# Patient Record
Sex: Male | Born: 1960 | Race: White | Hispanic: No | Marital: Single | Smoking: Former smoker
Health system: Southern US, Community
[De-identification: ages and names within clinical notes are randomized; demographics above are authoritative.]

## PROBLEM LIST (undated history)

## (undated) DIAGNOSIS — E11319 Type 2 diabetes mellitus with unspecified diabetic retinopathy without macular edema: Secondary | ICD-10-CM

## (undated) DIAGNOSIS — I459 Conduction disorder, unspecified: Secondary | ICD-10-CM

## (undated) DIAGNOSIS — K259 Gastric ulcer, unspecified as acute or chronic, without hemorrhage or perforation: Secondary | ICD-10-CM

## (undated) DIAGNOSIS — R0602 Shortness of breath: Secondary | ICD-10-CM

## (undated) DIAGNOSIS — J449 Chronic obstructive pulmonary disease, unspecified: Secondary | ICD-10-CM

## (undated) DIAGNOSIS — I10 Essential (primary) hypertension: Secondary | ICD-10-CM

## (undated) DIAGNOSIS — M1990 Unspecified osteoarthritis, unspecified site: Secondary | ICD-10-CM

## (undated) DIAGNOSIS — J189 Pneumonia, unspecified organism: Secondary | ICD-10-CM

## (undated) DIAGNOSIS — G4730 Sleep apnea, unspecified: Secondary | ICD-10-CM

## (undated) DIAGNOSIS — Z87442 Personal history of urinary calculi: Secondary | ICD-10-CM

## (undated) DIAGNOSIS — H35039 Hypertensive retinopathy, unspecified eye: Secondary | ICD-10-CM

## (undated) DIAGNOSIS — Z8719 Personal history of other diseases of the digestive system: Secondary | ICD-10-CM

## (undated) DIAGNOSIS — E785 Hyperlipidemia, unspecified: Secondary | ICD-10-CM

## (undated) DIAGNOSIS — J339 Nasal polyp, unspecified: Secondary | ICD-10-CM

## (undated) DIAGNOSIS — K219 Gastro-esophageal reflux disease without esophagitis: Secondary | ICD-10-CM

## (undated) HISTORY — PX: ROTATOR CUFF REPAIR: SHX139

## (undated) HISTORY — PX: COLONOSCOPY W/ POLYPECTOMY: SHX1380

## (undated) HISTORY — DX: Type 2 diabetes mellitus with unspecified diabetic retinopathy without macular edema: E11.319

## (undated) HISTORY — PX: ESOPHAGOGASTRODUODENOSCOPY: SHX1529

## (undated) HISTORY — DX: Hypertensive retinopathy, unspecified eye: H35.039

## (undated) HISTORY — PX: EYE SURGERY: SHX253

## (undated) HISTORY — DX: Conduction disorder, unspecified: I45.9

## (undated) NOTE — *Deleted (*Deleted)
[facility] - Clinic Note  11/23/2019     CHIEF COMPLAINT Patient presents for No chief complaint on file.   HISTORY OF PRESENT ILLNESS: Juan [name] is a 46 y.o. male who presents to the clinic today for:   pt states he has been having a lot of double vision over the past month, he states he still has a blank spot in his left eye vision, he states the double vision is worse when he is looking to the left   Referring physician: Laurann Montana, MD [phone] [address] [place],  [place] [address]  HISTORICAL INFORMATION:   Selected notes from the MEDICAL RECORD NUMBER Referred by Dr. Jethro Bolus for concern of CME s/p cataract sx   CURRENT MEDICATIONS: Current Outpatient Medications (Ophthalmic Drugs)  Medication Sig  . dorzolamide-timolol (COSOPT) 22.3-6.8 MG/ML ophthalmic solution Place 1 drop into both eyes 2 (two) times daily.  Marland Kitchen ketorolac (ACULAR) 0.5 % ophthalmic solution Place 1 drop into both eyes 4 (four) times daily. (Patient taking differently: Place 1 drop into both eyes in the morning and at bedtime. )  . prednisoLONE acetate (PRED FORTE) 1 % ophthalmic suspension Place 1 drop into both eyes 4 (four) times daily.   No current facility-administered medications for this visit. (Ophthalmic Drugs)   Current Outpatient Medications (Other)  Medication Sig  . ADVAIR DISKUS 500-50 MCG/DOSE AEPB Inhale 1 puff into the lungs 2 (two) times daily.  Marland Kitchen albuterol (PROVENTIL HFA;VENTOLIN HFA) 108 (90 BASE) MCG/ACT inhaler Inhale 1-2 puffs into the lungs every 6 (six) hours as needed for wheezing or shortness of breath.   . alfuzosin (UROXATRAL) 10 MG 24 hr tablet TAKE 1 TABLET BY MOUTH TWICE A DAY  . aspirin EC 81 MG tablet Take 81 mg by mouth daily.  Marland Kitchen atorvastatin (LIPITOR) 40 MG tablet Take 40 mg by mouth daily.  . carvedilol (COREG) 6.25 MG tablet Take 1 tablet (6.25 mg total) by mouth 2 (two) times daily with a meal.  . Empagliflozin-metFORMIN  HCl (SYNJARDY) 12.06-998 MG TABS Take 1 tablet by mouth 2 (two) times a day.   . esomeprazole (NEXIUM) 40 MG capsule Take 40 mg by mouth daily at 12 noon.  . insulin glargine, 2 Unit Dial, (TOUJEO MAX SOLOSTAR) 300 UNIT/ML Solostar Pen Inject 40 Units into the skin daily.   . iron polysaccharides (NIFEREX) 150 MG capsule Take 150 mg by mouth daily.  Marland Kitchen lisinopril (ZESTRIL) 40 MG tablet Take 40 mg by mouth daily.  . pantoprazole (PROTONIX) 40 MG tablet Take 40 mg by mouth daily.  . predniSONE (DELTASONE) 20 MG tablet Take by mouth.  . Semaglutide,0.25 or 0.5MG /DOS, (OZEMPIC, 0.25 OR 0.5 MG/DOSE,) 2 MG/1.5ML SOPN Inject into the skin.  . Tiotropium Bromide Monohydrate (SPIRIVA RESPIMAT) 2.5 MCG/ACT AERS Inhale 2 puffs into the lungs daily.   No current facility-administered medications for this visit. (Other)      REVIEW OF SYSTEMS:    ALLERGIES Allergies  Allergen Reactions  . Naproxen Shortness Of Breath  . Naproxen Sodium Shortness Of Breath  . Penicillins Rash    Has patient had a PCN reaction causing immediate rash, facial/tongue/throat swelling, SOB or lightheadedness with hypotension: Yes Has patient had a PCN reaction causing severe rash involving mucus membranes or skin necrosis: No Has patient had a PCN reaction that required hospitalization No Has patient had a PCN reaction occurring within the last 10 years: No If all of the above answers are "NO", then  may proceed with Cephalosporin use.  Has patient had a PCN reaction causing immediate rash, facial/tongue/throat swelling, SOB or lightheadedness with hypotension: Yes Has patient had a PCN reaction causing severe rash involving mucus membranes or skin necrosis: No Has patient had a PCN reaction that required hospitalization No Has patient had a PCN reaction occurring within the last 10 years: No If all of the above answers are "NO", then may proceed with Cephalosporin use.     PAST MEDICAL HISTORY Past Medical  History:  Diagnosis Date  . Arthritis   . Asthma   . COPD (chronic obstructive pulmonary disease) (HCC)   . Diabetes mellitus    Type 2  . Diabetic retinopathy (HCC)    NPDR OU  . GERD (gastroesophageal reflux disease)   . H/O hiatal hernia   . History of kidney stones   . Hyperlipemia   . Hypertension   . Hypertensive retinopathy    OU  . Nasal polyps   . Pneumonia 2014  . Shortness of breath   . Sleep apnea    pt has CPAP but is unable to use it d/t having polyps in his nose. Pt stated "I need to call and get a CPAP mask instead"  . Stomach ulcer    Past Surgical History:  Procedure Laterality Date  . CATARACT EXTRACTION Right 07/05/2018   Dr. Nile Riggs  . CATARACT EXTRACTION Left 07/12/2018   Dr. Nile Riggs  . CHOLECYSTECTOMY N/A 01/23/2015   Procedure: LAPAROSCOPIC CHOLECYSTECTOMY ;  Surgeon: Violeta Gelinas, MD;  Location: [facility] OR;  Service: General;  Laterality: N/A;  . COLONOSCOPY W/ POLYPECTOMY    . ESOPHAGOGASTRODUODENOSCOPY    . EYE SURGERY    . KNEE ARTHROSCOPY  03/24/2011   Procedure: ARTHROSCOPY KNEE;  Surgeon: Harvie Junior, MD;  Location: [facility];  Service: Orthopedics;  Laterality: Right;  partial medial menisectomy, chondroplaty patella, and medial medial plica  . LEFT HEART CATH AND CORONARY ANGIOGRAPHY N/A 06/29/2019   Procedure: LEFT HEART CATH AND CORONARY ANGIOGRAPHY;  Surgeon: Iran Ouch, MD;  Location: [facility];  Service: Cardiovascular;  Laterality: N/A;    FAMILY HISTORY Family History  Problem Relation Age of Onset  . Diabetes Father     SOCIAL HISTORY Social History   Tobacco Use  . Smoking status: Former Smoker    Packs/day: 3.00    Years: 20.00    Pack years: 60.00    Types: Cigarettes  . Smokeless tobacco: Current User    Types: Chew  . Tobacco comment: 1 can/daily  Substance Use Topics  . Alcohol use: Yes    Comment: social, 1x weekly  . Drug use: No         OPHTHALMIC EXAM:  Not recorded      IMAGING AND PROCEDURES  Imaging and Procedures for @TODAY @           ASSESSMENT/PLAN:    ICD-10-CM   1. Severe nonproliferative diabetic retinopathy of both eyes with macular edema associated with type 2 diabetes mellitus (HCC)  Z61.0960   2. 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 Essential hypertension  I10   5. Hypertensive retinopathy of both eyes  H35.033   6. Pseudophakia of both eyes  Z96.1   7. Ocular hypertension of right eye  H40.051     1. Severe nonproliferative diabetic retinopathy with DME, OU (OS>OD)  - s/p IVA OD #  1 (06.26.20), #2 (08.14.20), #3 (09.17.20), #4 (10.16.20)  - s/p IVA OS #1 (07.17.20), #2 (08.14.20), #3 (09.17.20)  - s/p IVA OU (08.14.20), #3 (09.17.20)  - s/p IVE OS #1 (10.16.20) - sample, #2 (11.13.20), #3 (12.11.20), #4 (01.18.21), #5 (02.19.21), #6 (03.19.21), #7 (04.16.21), #8 (05.14.21), #9 (06.17.21), #10 (07.16.21), #11 (09.21.21)  - s/p IVE OD #1 (11.13.20), #2 (12.11.20), #3 (01.18.21), #4 (02.19.21), #5 (03.19.21), #6 (04.16.21), #7 (05.14.21), #8 (06.17.21), #9 (07.16.21), #10 (09.21.21)  - FA (06.26.20) shows Severe NPDR OU w/ Late leaking MA OU, Enlarged FAZ OU, No NV OU, No significant hyperfluorescence of disc  - pt reporting worsening binocular diplopia in left gaze over the last month; no diplopia in primary gaze  - BCVA: OD 20/25 - stable; OS stable at 20/60  - OCT shows mild interval improvement in IRF OU  - recommend IVE OD #11 and IVE #12 OS today, 10.22.21  - pt wishes to proceed  - RBA of procedure discussed, questions answered  - informed consent obtained  - Eylea informed consent form signed and scanned on 01.18.2021  - see procedure note  - [employer] Benefits Investigation initiated 10.16.2020 -- approved for 2021  - f/u in 4 weeks -- DFE/OCT/possible injection OU  2. Retinal Edema OU  - based on FA, suspect majority of macular edema due to DM as above, but  there may be some edema secondary to post-op CME  - cont PF qid OU and ketorolac qid OU   - history of difficulty with compliance at work  - monitor  - f/u 4 wks  3. Choroidal Nevus OS  - located at 1200, mild elevation, +drusen, no SRF  - baseline optos pictures obtained, 06.26.20  - discussed possible referral to ocular oncologist at [facility] for evaluation/management  4,5.Hypertensive retinopathy OU  - discussed importance of tight BP control  - monitor  6. Pseudophakia OU  - s/p CE/IOL OU (OD on 06.03.20 and OS 06.10.20 by Dr. Nile Riggs)  - beautiful surgeries w/ IOLs in excellent position  - macular edema limiting vision as above  - monitor  7. Ocular hypertension OD  - IOP 13 OD  - cont Cosopt BID OD  - monitor   Ophthalmic Meds Ordered this visit:  No orders of the defined types were placed in this encounter.     No follow-ups on file.  There are no Patient Instructions on file for this visit.   Explained the diagnoses, plan, and follow up with the patient and they expressed understanding.  Patient expressed understanding of the importance of proper follow up care.   This document serves as a record of services personally performed by Karie Chimera, MD, PhD. It was created on their behalf by Glee Arvin. Manson Passey, OA an ophthalmic technician. The creation of this record is the provider's dictation and/or activities during the visit.    Electronically signed by: Glee Arvin. [place], [place] 10.18.2021 1:03 PM  Karie Chimera, M.D., Ph.D. Diseases & Surgery of the Retina and Vitreous [facility]   Abbreviations: M myopia (nearsighted); A astigmatism; H hyperopia (farsighted); P presbyopia; Mrx spectacle prescription;  CTL contact lenses; OD right eye; OS left eye; OU both eyes  XT exotropia; ET esotropia; PEK punctate epithelial keratitis; PEE punctate epithelial erosions; DES dry eye syndrome; MGD meibomian gland dysfunction; ATs artificial  tears; PFAT's preservative free artificial tears; NSC nuclear sclerotic cataract; PSC posterior subcapsular cataract; ERM epi-retinal membrane; PVD posterior vitreous detachment; RD  retinal detachment; DM diabetes mellitus; DR diabetic retinopathy; NPDR non-proliferative diabetic retinopathy; PDR proliferative diabetic retinopathy; CSME clinically significant macular edema; DME diabetic macular edema; dbh dot blot hemorrhages; CWS cotton wool spot; POAG primary open angle glaucoma; C/D cup-to-disc ratio; HVF humphrey visual field; GVF goldmann visual field; OCT optical coherence tomography; IOP intraocular pressure; BRVO Branch retinal vein occlusion; CRVO central retinal vein occlusion; CRAO central retinal artery occlusion; BRAO branch retinal artery occlusion; RT retinal tear; SB scleral buckle; PPV pars plana vitrectomy; VH Vitreous hemorrhage; PRP panretinal laser photocoagulation; IVK intravitreal kenalog; VMT vitreomacular traction; MH Macular hole;  NVD neovascularization of the disc; NVE neovascularization elsewhere; AREDS age related eye disease study; ARMD age related macular degeneration; POAG primary open angle glaucoma; EBMD epithelial/anterior basement membrane dystrophy; ACIOL anterior chamber intraocular lens; IOL intraocular lens; PCIOL posterior chamber intraocular lens; Phaco/IOL phacoemulsification with intraocular lens placement; PRK photorefractive keratectomy; LASIK laser assisted in situ keratomileusis; HTN hypertension; DM diabetes mellitus; COPD chronic obstructive pulmonary disease

---

## 1997-06-10 ENCOUNTER — Emergency Department (HOSPITAL_COMMUNITY): Admission: EM | Admit: 1997-06-10 | Discharge: 1997-06-10 | Payer: Self-pay | Admitting: Emergency Medicine

## 1998-03-05 ENCOUNTER — Emergency Department (HOSPITAL_COMMUNITY): Admission: EM | Admit: 1998-03-05 | Discharge: 1998-03-05 | Payer: Self-pay | Admitting: Emergency Medicine

## 1998-03-16 ENCOUNTER — Ambulatory Visit (HOSPITAL_COMMUNITY): Admission: RE | Admit: 1998-03-16 | Discharge: 1998-03-16 | Payer: Self-pay | Admitting: Orthopedic Surgery

## 1998-03-16 ENCOUNTER — Encounter: Payer: Self-pay | Admitting: Orthopedic Surgery

## 1998-03-19 ENCOUNTER — Encounter: Admission: RE | Admit: 1998-03-19 | Discharge: 1998-03-19 | Payer: Self-pay | Admitting: *Deleted

## 1998-04-16 ENCOUNTER — Emergency Department (HOSPITAL_COMMUNITY): Admission: EM | Admit: 1998-04-16 | Discharge: 1998-04-16 | Payer: Self-pay

## 1998-04-16 ENCOUNTER — Encounter: Payer: Self-pay | Admitting: Emergency Medicine

## 1998-07-04 ENCOUNTER — Emergency Department (HOSPITAL_COMMUNITY): Admission: EM | Admit: 1998-07-04 | Discharge: 1998-07-04 | Payer: Self-pay | Admitting: Emergency Medicine

## 1998-07-04 ENCOUNTER — Encounter: Payer: Self-pay | Admitting: Emergency Medicine

## 1998-07-30 ENCOUNTER — Encounter: Payer: Self-pay | Admitting: Emergency Medicine

## 1998-07-30 ENCOUNTER — Observation Stay (HOSPITAL_COMMUNITY): Admission: EM | Admit: 1998-07-30 | Discharge: 1998-07-31 | Payer: Self-pay | Admitting: Emergency Medicine

## 1998-09-01 ENCOUNTER — Emergency Department (HOSPITAL_COMMUNITY): Admission: EM | Admit: 1998-09-01 | Discharge: 1998-09-01 | Payer: Self-pay | Admitting: Emergency Medicine

## 1999-03-15 ENCOUNTER — Emergency Department (HOSPITAL_COMMUNITY): Admission: EM | Admit: 1999-03-15 | Discharge: 1999-03-15 | Payer: Self-pay | Admitting: Emergency Medicine

## 1999-03-15 ENCOUNTER — Encounter: Payer: Self-pay | Admitting: Emergency Medicine

## 1999-04-11 ENCOUNTER — Emergency Department (HOSPITAL_COMMUNITY): Admission: EM | Admit: 1999-04-11 | Discharge: 1999-04-11 | Payer: Self-pay | Admitting: Emergency Medicine

## 1999-05-14 ENCOUNTER — Emergency Department (HOSPITAL_COMMUNITY): Admission: EM | Admit: 1999-05-14 | Discharge: 1999-05-14 | Payer: Self-pay | Admitting: Emergency Medicine

## 1999-05-14 ENCOUNTER — Encounter: Payer: Self-pay | Admitting: Emergency Medicine

## 1999-06-09 ENCOUNTER — Encounter: Payer: Self-pay | Admitting: Emergency Medicine

## 1999-06-09 ENCOUNTER — Emergency Department (HOSPITAL_COMMUNITY): Admission: EM | Admit: 1999-06-09 | Discharge: 1999-06-09 | Payer: Self-pay | Admitting: Emergency Medicine

## 1999-07-23 ENCOUNTER — Emergency Department (HOSPITAL_COMMUNITY): Admission: EM | Admit: 1999-07-23 | Discharge: 1999-07-24 | Payer: Self-pay | Admitting: Emergency Medicine

## 1999-07-24 ENCOUNTER — Encounter: Payer: Self-pay | Admitting: Emergency Medicine

## 2000-01-10 ENCOUNTER — Emergency Department (HOSPITAL_COMMUNITY): Admission: EM | Admit: 2000-01-10 | Discharge: 2000-01-10 | Payer: Self-pay | Admitting: Emergency Medicine

## 2000-02-09 ENCOUNTER — Emergency Department (HOSPITAL_COMMUNITY): Admission: EM | Admit: 2000-02-09 | Discharge: 2000-02-09 | Payer: Self-pay | Admitting: Emergency Medicine

## 2000-04-04 ENCOUNTER — Encounter: Payer: Self-pay | Admitting: Emergency Medicine

## 2000-04-04 ENCOUNTER — Emergency Department (HOSPITAL_COMMUNITY): Admission: EM | Admit: 2000-04-04 | Discharge: 2000-04-04 | Payer: Self-pay | Admitting: Emergency Medicine

## 2000-04-29 ENCOUNTER — Emergency Department (HOSPITAL_COMMUNITY): Admission: EM | Admit: 2000-04-29 | Discharge: 2000-04-29 | Payer: Self-pay | Admitting: Emergency Medicine

## 2000-04-29 ENCOUNTER — Encounter: Payer: Self-pay | Admitting: Emergency Medicine

## 2004-07-06 ENCOUNTER — Observation Stay (HOSPITAL_COMMUNITY): Admission: EM | Admit: 2004-07-06 | Discharge: 2004-07-07 | Payer: Self-pay | Admitting: Emergency Medicine

## 2004-07-07 ENCOUNTER — Encounter: Payer: Self-pay | Admitting: Cardiology

## 2006-08-09 ENCOUNTER — Emergency Department (HOSPITAL_COMMUNITY): Admission: EM | Admit: 2006-08-09 | Discharge: 2006-08-09 | Payer: Self-pay | Admitting: Emergency Medicine

## 2010-03-04 ENCOUNTER — Observation Stay (HOSPITAL_COMMUNITY)
Admission: EM | Admit: 2010-03-04 | Discharge: 2010-03-07 | Disposition: A | Discharge status: Home / Self Care | Payer: Commercial Indemnity | Attending: Internal Medicine | Admitting: Internal Medicine

## 2010-03-04 ENCOUNTER — Emergency Department (HOSPITAL_COMMUNITY): Payer: Commercial Indemnity

## 2010-03-04 DIAGNOSIS — Z79899 Other long term (current) drug therapy: Secondary | ICD-10-CM | POA: Insufficient documentation

## 2010-03-04 DIAGNOSIS — J449 Chronic obstructive pulmonary disease, unspecified: Secondary | ICD-10-CM | POA: Insufficient documentation

## 2010-03-04 DIAGNOSIS — R1013 Epigastric pain: Secondary | ICD-10-CM | POA: Insufficient documentation

## 2010-03-04 DIAGNOSIS — R0789 Other chest pain: Principal | ICD-10-CM | POA: Insufficient documentation

## 2010-03-04 DIAGNOSIS — F411 Generalized anxiety disorder: Secondary | ICD-10-CM | POA: Insufficient documentation

## 2010-03-04 DIAGNOSIS — E785 Hyperlipidemia, unspecified: Secondary | ICD-10-CM | POA: Insufficient documentation

## 2010-03-04 DIAGNOSIS — E119 Type 2 diabetes mellitus without complications: Secondary | ICD-10-CM | POA: Insufficient documentation

## 2010-03-04 DIAGNOSIS — Z7982 Long term (current) use of aspirin: Secondary | ICD-10-CM | POA: Insufficient documentation

## 2010-03-04 DIAGNOSIS — J4489 Other specified chronic obstructive pulmonary disease: Secondary | ICD-10-CM | POA: Insufficient documentation

## 2010-03-04 DIAGNOSIS — K8020 Calculus of gallbladder without cholecystitis without obstruction: Secondary | ICD-10-CM | POA: Insufficient documentation

## 2010-03-04 DIAGNOSIS — R000 Tachycardia, unspecified: Secondary | ICD-10-CM | POA: Insufficient documentation

## 2010-03-04 DIAGNOSIS — I10 Essential (primary) hypertension: Secondary | ICD-10-CM | POA: Insufficient documentation

## 2010-03-04 LAB — POCT I-STAT, CHEM 8
BUN: 12 mg/dL (ref 6–23)
Calcium, Ion: 1.11 mmol/L — ABNORMAL LOW (ref 1.12–1.32)
Chloride: 101 mEq/L (ref 96–112)
Creatinine, Ser: 0.9 mg/dL (ref 0.4–1.5)
Glucose, Bld: 248 mg/dL — ABNORMAL HIGH (ref 70–99)
HCT: 42 % (ref 39.0–52.0)
Hemoglobin: 14.3 g/dL (ref 13.0–17.0)
Potassium: 3.8 mEq/L (ref 3.5–5.1)
Sodium: 139 mEq/L (ref 135–145)
TCO2: 25 mmol/L (ref 0–100)

## 2010-03-04 LAB — CBC
HCT: 40.5 % (ref 39.0–52.0)
Hemoglobin: 15 g/dL (ref 13.0–17.0)
MCHC: 37 g/dL — ABNORMAL HIGH (ref 30.0–36.0)
MCV: 84.7 fL (ref 78.0–100.0)
RDW: 13.9 % (ref 11.5–15.5)
WBC: 6.6 10*3/uL (ref 4.0–10.5)

## 2010-03-04 LAB — POCT CARDIAC MARKERS
CKMB, poc: 1.9 ng/mL (ref 1.0–8.0)
CKMB, poc: 1.8 ng/mL (ref 1.0–8.0)
Myoglobin, poc: 63 ng/mL (ref 12–200)

## 2010-03-04 LAB — DIFFERENTIAL
Basophils Absolute: 0 10*3/uL (ref 0.0–0.1)
Eosinophils Relative: 2 % (ref 0–5)
Lymphocytes Relative: 21 % (ref 12–46)
Lymphs Abs: 1.4 10*3/uL (ref 0.7–4.0)
Monocytes Absolute: 0.6 10*3/uL (ref 0.1–1.0)
Neutro Abs: 4.5 10*3/uL (ref 1.7–7.7)

## 2010-03-04 MED ORDER — IOHEXOL 300 MG/ML  SOLN
100.0000 mL | Freq: Once | INTRAMUSCULAR | Status: AC | PRN
  Filled 2010-03-04: qty 100

## 2010-03-05 DIAGNOSIS — R079 Chest pain, unspecified: Secondary | ICD-10-CM

## 2010-03-05 LAB — LIPID PANEL
HDL: 32 mg/dL — ABNORMAL LOW (ref >39)
Total CHOL/HDL Ratio: 2.8 RATIO
VLDL: 13 mg/dL (ref 0–40)

## 2010-03-05 LAB — CARDIAC PANEL(CRET KIN+CKTOT+MB+TROPI)
CK, MB: 2.4 ng/mL (ref 0.3–4.0)
CK, MB: 2.1 ng/mL (ref 0.3–4.0)
CK, MB: 2.3 ng/mL (ref 0.3–4.0)
CK, MB: 2.6 ng/mL (ref 0.3–4.0)
Relative Index: 2 (ref 0.0–2.5)
Total CK: 100 U/L (ref 7–232)
Total CK: 105 U/L (ref 7–232)
Total CK: 99 U/L (ref 7–232)
Troponin I: 0.01 ng/mL (ref 0.00–0.06)
Troponin I: 0.01 ng/mL (ref 0.00–0.06)

## 2010-03-05 LAB — GLUCOSE, CAPILLARY
Glucose-Capillary: 115 mg/dL — ABNORMAL HIGH (ref 70–99)
Glucose-Capillary: 152 mg/dL — ABNORMAL HIGH (ref 70–99)
Glucose-Capillary: 278 mg/dL — ABNORMAL HIGH (ref 70–99)

## 2010-03-05 LAB — COMPREHENSIVE METABOLIC PANEL
ALT: 30 U/L (ref 0–53)
AST: 22 U/L (ref 0–37)
Alkaline Phosphatase: 46 U/L (ref 39–117)
Calcium: 9.1 mg/dL (ref 8.4–10.5)
GFR calc Af Amer: >60 mL/min (ref >60)
Potassium: 4.1 mEq/L (ref 3.5–5.1)
Sodium: 137 mEq/L (ref 135–145)
Total Protein: 6.3 g/dL (ref 6.0–8.3)

## 2010-03-05 LAB — HEMOGLOBIN A1C: Hgb A1c MFr Bld: 9.1 % — ABNORMAL HIGH (ref <5.7)

## 2010-03-06 LAB — GLUCOSE, CAPILLARY: Glucose-Capillary: 213 mg/dL — ABNORMAL HIGH (ref 70–99)

## 2010-03-06 LAB — CARDIAC PANEL(CRET KIN+CKTOT+MB+TROPI)
CK, MB: 2.7 ng/mL (ref 0.3–4.0)
Total CK: 77 U/L (ref 7–232)

## 2010-03-07 LAB — GLUCOSE, CAPILLARY
Glucose-Capillary: 184 mg/dL — ABNORMAL HIGH (ref 70–99)
Glucose-Capillary: 251 mg/dL — ABNORMAL HIGH (ref 70–99)

## 2010-03-14 NOTE — Op Note (Signed)
  NAMEAMAREON, Juan [name]              ACCOUNT NO.:  [id number]  MEDICAL RECORD NO.:  [id number]           PATIENT TYPE:  I  LOCATION:  1427                         FACILITY:  [facility]  PHYSICIAN:  Luis Abed, MD, FACCDATE OF BIRTH:  Jul 04, 1960  DATE OF PROCEDURE:  03/06/2010 DATE OF DISCHARGE:                              OPERATIVE REPORT   The patient has had chest pressure.  This study is done for further evaluation.  Walking on the treadmill, the patient exercised for a total of 6 minutes on the Bruce protocol.  Peak heart rate was 153 representing 89% predicted maximum heart rate.  There was no chest pain.  There was no EKG change.  There was no significant ectopy.  This is a negative (normal) GXT.  There is no definite sign of ischemia.     Luis Abed, MD, [facility]     JDK/MEDQ  D:  03/07/2010  T:  03/07/2010  Job:  [id number]  Electronically Signed by Willa Rough MD FACC on 03/09/2010 01:49:00 PM

## 2010-03-25 NOTE — H&P (Signed)
NAMENASIRE, REALI              ACCOUNT NO.:  [id number]  MEDICAL RECORD NO.:  [id number]           PATIENT TYPE:  E  LOCATION:  WLED                         FACILITY:  [facility]  PHYSICIAN:  Massie Maroon, MD        DATE OF BIRTH:  29-Jul-1960  DATE OF ADMISSION:  03/04/2010 DATE OF DISCHARGE:                             HISTORY & PHYSICAL   PRIMARY CARE PHYSICIAN:  Stacie Acres. White, MD  CHIEF COMPLAINT:  Chest tightness.  HISTORY OF PRESENT ILLNESS:  This is a 50 year old male with a history of diabetes type 2, hypertension, hyperlipidemia, asthma/COPD, who apparently complains of tightness in the chest without radiation.  It is substernal in nature.  This started actually lying down this morning about 1:00.  The patient denies any radiation of the pain.  The patient denies any fever, chills, cough, palpitations, nausea, vomiting. Nothing seems to make it better or worse.  Other than possibly laying down, makes it worse.  The patient tried albuterol without relief. Because of chest tightness, he presented to the ED for evaluation.  He noted that his father had a heart attack at age 83 and died.  EKG shows a normal sinus rhythm at 90, normal axis, normal PR interval, no ST, T segment changes consistent with ischemia.  Chest x-ray was negative for any acute process.  Initial set of cardiac markers was negative.  CT angio chest shows no evidence of pulmonary embolism. There were several tiny calcified gallstones incidentally noted.  The patient was admitted for workup of chest pain.  PAST MEDICAL HISTORY: 1. Asthma/COPD. 2. Diabetes type 2. 3. Hypertension. 4. Hyperlipidemia. 5. GERD.  PAST SURGICAL HISTORY:  None.  SOCIAL HISTORY:  The patient lives with spouse.  He does not smoke or drink at the present time.  He quit smoking about 10 years, smoked 1 pack per day for 40 years.  FAMILY HISTORY:  Mother is alive at age 50 and healthy.  Father died at age 54 of heart attack  and he was a smoker.  ALLERGIES:  PENICILLIN CAUSES RASH.  MEDICATIONS: 1. Advair Diskus?  250/50 1 puff b.i.d. 2. Albuterol as needed. 3. Enteric-coated aspirin 325 mg p.o. daily. 4. Glimepiride ? dose one p.o. b.i.d. 5. Lipitor ? dose q.h.s. 6. Lisinopril 5 mg p.o. daily. 7. Metformin 2000 mg p.o. q.p.m.  REVIEW OF SYSTEMS:  Negative for all 10 organ systems except for pertinent positives stated above.  PHYSICAL EXAM:  VITAL SIGNS:  Temperature 97.6, pulse 112, blood pressure is 151/87, pulse ox of 100% on room air. HEENT:  Anicteric, EOMI, no nystagmus, pupils 1.5 mm, symmetric.  Direct and consensual corneal reflexes intact.  Mucous membranes moist. NECK:  No JVD, no bruit, no thyromegaly, no adenopathy. HEART:  Regular rate and rhythm.  S1-S2.  No murmurs, gallops, or rubs. LUNGS:  Clear to auscultation bilaterally. ABDOMEN:  Soft, nontender, nondistended.  Positive bowel sounds. EXTREMITIES:  No cyanosis, clubbing, or edema. SKIN:  No rashes, lymphadenopathy, or adenopathy. NEURO:  Exam nonfocal.  LABORATORIES:  BUN 12, creatinine 0.9.  Troponin-I less than 0.05.  WBC 6.6, hemoglobin 15.0,  platelet count 159,000.  CT angio chest negative for pulmonary embolism or any acute findings, cholelithiasis incidentally noted.  ASSESSMENT/PLAN: 1. Chest pain with cardiac risk factors:  The patient placed on     telemetry.  We will check CK, CK-MB, troponin I q.6h. x3 sets.  He     will be continued on aspirin, Lipitor, and started on carvedilol     3.25 mg p.o. b.i.d.  The patient will also be continued on Advair     Diskus 250/50 1 puff b.i.d. and continued on metformin 1000 mg p.o.     b.i.d. and glimepiride 2 mg p.o. b.i.d. for now. 2. Diabetes type 2:  Fingerstick blood sugars a.c. and h.s.  Continue     NovoLog sliding scale and metformin and glimepiride as stated     above. 3. Hypertension.  Continue lisinopril and carvedilol. 4. Hyperlipidemia:  Continue Lipitor 80 mg  p.o. q.h.s.     Massie Maroon, MD     JYK/MEDQ  D:  03/04/2010  T:  03/04/2010  Job:  [id number]  cc:   Stacie Acres. Cliffton Asters, M.D. Fax: [phone]  Electronically Signed by Pearson Grippe MD on 03/25/2010 01:25:10 PM

## 2010-04-10 NOTE — Consult Note (Signed)
NAMEARNEY, MAYABB              ACCOUNT NO.:  [id number]  MEDICAL RECORD NO.:  [id number]           PATIENT TYPE:  I  LOCATION:  1427                         FACILITY:  [facility]  PHYSICIAN:  Pricilla Riffle, MD, FACCDATE OF BIRTH:  08/14/1960  DATE OF CONSULTATION:  03/05/2010 DATE OF DISCHARGE:                                CONSULTATION   IDENTIFICATION:  The patient is a 50 year old who we are asked to see regarding chest pain pressure.  HISTORY OF PRESENT ILLNESS:  The patient has no known history of coronary artery disease.  He had a stress test back in 2006 [facility]) that was normal.  Found to have a gastric ulcer at that time, it was causing chest pain treated for several years.  Note, he has had intermittent chest pain over the last several years that lasts 5-30 minutes, occurring at rest or with work.  Yesterday, he was going to lay down and noticed chest tightness that came across his chest.  No associated nausea, vomiting, significant shortness of breath lasted from approximately 1 p.m. to 9 p.m., relieved some after breathing treatment.  CTA was done without pulmonary embolus. EKG showed no acute changes.  We are consulted for further evaluation. He currently is pain free.  ALLERGIES:  PENICILLIN rash.  Inpatient meds include: 1. Aspirin 325. 2. Coreg 3.125 b.i.d. 3. Advair b.i.d. 4. Amaryl b.i.d. 5. NovoLog sliding scale. 6. Metformin 1 g b.i.d. 7. Crestor 40.  At home, he had been on: 1. Metformin 2 g bedtime. 2. Advair Diskus 1 puff b.i.d. 3. Onglyza 5 mg. 4. Lisinopril/HCTZ 20/25 daily. 5. Lipitor 40 daily. 6. Glyburide 4 mg b.i.d. 7. Ventolin inhalers p.r.n. 8. Aspirin 81 mg daily.  PAST MEDICAL HISTORY: 1. Diabetes. 2. Hypertension. 3. Hyperlipidemia. 4. COPD/asthma. 5. Gastric ulcers.  SOCIAL HISTORY:  The patient lives in [place].  He is divorced, lives with his sons, quit 10 years ago after a 40-pack year history of smoking.  Uses  occasionally.  Drinks occasionally.  Works as a Pharmacist, hospital.  FAMILY HISTORY:  Mother is healthy at age 30.  Father died at age 29 of an MI.  REVIEW OF SYSTEMS:  The patient does note recent URI in early January, feels like he did not fully recover and also has occasional reflux. Pain is substernal, shoots up chest.  Otherwise, all systems reviewed, negative to the above problem except as noted above.  PHYSICAL EXAMINATION:  GENERAL:  The patient is in no acute distress. VITAL SIGNS:  Blood pressure 143/77, pulse is 90 and regular, temperature is 97.5, O2 sat on room air 97%. HEENT:  Normocephalic, atraumatic.  EOMI.  PERRL. NECK:  JVP is normal.  No bruits.  No thyromegaly. LUNGS:  Rhonchi and wheezes.  On exam, moving air well.  No rales. CARDIAC:  Regular rate and rhythm.  S1 and S2.  No S3, S4, or murmurs. ABDOMEN:  Supple, nontender.  Normal bowel sounds.  No masses. CHEST:  Nontender. EXTREMITIES:  2+ distal pulses throughout.  No lower extremity edema. MUSCULOSKELETAL:  Moving all extremities.  No deformity. NEUROLOGIC:  Alert and orient x3.  Cranial  nerves II-XII grossly intact.  Chest x-ray shows no acute disease.  CTA with no pulmonary embolus.  EKG shows normal sinus rhythm, 90 beats per minute.  Labs, significant for a BUN and creatinine of 12 and 1.1.  CK-MB, troponin negative x3.  LDL is 45, HDL 32, triglycerides of 66.  AST of 22, hemoglobin of 15, WBC of 6.6.  IMPRESSION: 1. The patient is a 50 year old with hypertension, hyperlipidemia,     diabetes.  Lipids under excellent control.  Admitted with chest     tightness from yesterday.  Note, he had a URI in January, felt like     he has a recovery.  On exam, he has wheezes and rhonchi.  Moving     air.  With his risk factors of diabetes, family history, and history of chewing tobacco, smoking, and the fact that he has not had this tightness before, I recommended GXT Myoview in the morning.  If  negative follow up closely with Laurann Montana as an outpatient.  I would also recommend increasing his treatment for asthma with steroids and taper with his GI history, I have would give empiric proton pump inhibitors.  If the Myoview is negative, I would place him back on his lisinopril/HCTZ as he has been tolerating this and discontinue the beta- blocker. 1. Dyslipidemia excellent control.  Continue statin. 2. Diabetes on oral agents. 3. History of chew counseled on stopping.     Pricilla Riffle, MD, [facility]     PVR/MEDQ  D:  03/05/2010  T:  03/06/2010  Job:  [id number]  Electronically Signed by Dietrich Pates MD [facility] on 04/09/2010 02:12:20 PM

## 2010-05-28 NOTE — Discharge Summary (Signed)
  [name], Juan [name]              ACCOUNT NO.:  [id number]  MEDICAL RECORD NO.:  [id number]           PATIENT TYPE:  I  LOCATION:  1427                         [facility]ACILITY:  [facility]  PHYSICIAN:  Zannie Cove, MD     DATE OF BIRTH:  1960-08-04  DATE OF ADMISSION:  03/04/2010 DATE OF DISCHARGE:  03/07/2010                         DISCHARGE SUMMARY-REFERRING   DISCHARGE DIAGNOSES: 1. Atypical chest pain, noncardiac. 2. Mild chronic obstructive pulmonary disease flare. 3. Anxiety disorder. 4. Chronic obstructive pulmonary disease. 5. Type 2 diabetes. 6. Hypertension. 7. Dyslipidemia. 8. Gastroesophageal reflux disease. 9. Cholelithiasis.  DISCHARGE MEDICATIONS: 1. Azithromycin 250 mg daily for 4 days. 2. Prednisone taper over 1 week. 3. Xanax 0.25 mg p.o. b.i.d. p.r.n. 4. Advair Diskus 500/50 one puff b.i.d. 5. Aspirin 81 mg daily. 6. Glimepiride 4 mg p.o. b.i.d. 7. Lipitor 40 mg p.o. daily. 8. Lisinopril/hydrochlorothiazide 20/25 one tablet daily. 9. Metformin XR 500 mg daily. 10.Onglyza 5 mg use as directed. 11.Ventolin inhaler 2 puffs q. 4 h p.r.n.  DIAGNOSTIC INVESTIGATIONS: 1. Chest x-ray, March 04, 2010, no acute cardiopulmonary disease. 2. CT angio of the chest, March 04, 2010, no evidence of PE,     cholelithiasis incidentally noted. 3. Exercise stress test by Bruce Protocol with no evidence of     ischemia.  HOSPITAL COURSE:  [name] is a 50 year old gentleman with history of hypertension, asthma, tobacco use, and diabetes, presented to the hospital with chest pressure, he ruled out for acute MI.  On evaluation, he was noted to have obstructive bronchitis with mild COPD flare, however, due to his risk factors, he underwent exercise stress test, which did not show any evidence of inducible ischemia.  The patient's clinical condition improved with steroids, nebulizers, and he was advised on smoking cessation as well.  The patient is being discharged home in  the stable condition.  Follow up with his primary physician in the next 7 to 10 days.  DISCHARGE CONDITION:  Stable.  VITAL SIGNS AT DISCHARGE:  Temperature 97.6, pulse 78, blood pressure 121/82, respirations 18, satting 98% on room air.  DISCHARGE FOLLOWUP:  Follow up with primary physician, Dr. Laurann Montana in 7 to 10 days.     Zannie Cove, MD     PJ/MEDQ  D:  05/27/2010  T:  05/27/2010  Job:  [id number]  Electronically Signed by Zannie Cove  on 05/28/2010 11:56:50 AM

## 2010-06-19 NOTE — H&P (Signed)
NAMEDYWANE, PERUSKI              ACCOUNT NO.:  [id number]   MEDICAL RECORD NO.:  [id number]          PATIENT TYPE:  EMS   LOCATION:  ED                           FACILITY:  [facility]   PHYSICIAN:  Jackie Plum, M.D.DATE OF BIRTH:  09-18-1960   DATE OF ADMISSION:  07/06/2004  DATE OF DISCHARGE:                                HISTORY & PHYSICAL   PRIMARY CARE PHYSICIAN:  Dr. Merri Brunette   CHIEF COMPLAINT:  Chest pain.   HISTORY OF PRESENT ILLNESS:  The patient is a 50 year old Caucasian  gentleman with multiple CAD risk factors including hypertension, diabetes,  dyslipidemia and previous 50 pack year history of cigarette smoking and  previous 60 pack year history of smoking (The patient indicates that he  stopped smoking two packs of cigarettes daily for more than 20 years about 3  years ago.)  The patient presented to the ED with complaint of sternal chest  pressure without radiation and nausea, vomiting, shortness of breath or  diaphoresis.  There is no known aggravating or alleviating factor.  The pain  was deemed mild to moderate in intensity and pain was improved with  nitroglycerin.  He has had no fever, chills, cough, sputum production, calf  or leg pain or ankle swelling.  He denies any epigastric or abdominal pain.  He has some belching which has been increased according to the patient,  however.  No dysuria with full micturition.   PAST MEDICAL HISTORY:  The patient has a history of bronchial asthma, never  intubated, not steroid dependent.  He has a history of diabetes,  hypertension, and dyslipidemia.   ALLERGIES:  PENICILLIN   CURRENT MEDICATIONS:  Lipitor, Glucophage and Amaryl.  Doses of these  medications are unclear.   SOCIAL HISTORY:  The patient does not smoke cigarettes, dinks alcohol  occasionally.  He used to smoke more than two packs of cigarettes daily for  about 20 years and stopped smoking about three years ago.   FAMILY HISTORY:  Positive for  diabetes.   REVIEW OF SYSTEMS:  As indicated in the History of Present Illness,  otherwise unremarkable.   PHYSICAL EXAMINATION:  VITAL SIGNS:  Blood pressure 127/76, pulse 105  beats/min, respirations 22, temperature 97.8 degrees Fahrenheit, oxygen  saturation of 97% on oxygen by nasal cannula.  GENERAL:  The patient was in acute cardiopulmonary distress.  HEENT:  The patient was not pale.  Oropharynx is moist.  NECK:  No JVD, no carotid bruit.  LUNGS:  Clear to auscultation.  ABDOMEN:  Soft nontender.  CARDIAC:  The patient was mildly tachycardic, no gallops.  EXTREMITIES:  No cyanosis, no edema.  NEUROLOGIC:  Nonfocal.  Sensory did not show an acute infiltrate.   A 12-lead EKG was said to have shown sinus tachycardia according to ED  notes.  However, it is not available for our review.   LABORATORY DATA:  CBC was essentially within normal limits. Cardiac markers  were negative.  Sodium 151, potassium 3.6, chloride 83, Co2 29, glucose 310,  BUN 12, creatinine 1.2, calcium 9.8, total protein 6.7.  Albumin 4.0, AST  26,  ALT 46, alk phos 49, bilirubin 1.4.  His last CBG tested by Dr. Lenard Forth  was 277 mg/dL.   IMPRESSION:  1.  Atypical chest pain in a patient with multiple medical problems.  2.  Uncontrolled diabetes mellitus.  3.  History of multiple coronary artery disease risk factors.   PLAN:  The patient was admitted to the hospital for rule out protocol.  His  glucose will be controlled.  I have consulted Dr. Armanda Magic of cardiology  for cardiac evaluation and possible stress test in the morning.  He is chest  pain free at this moment and should his cardiac enzymes be negative we will  plan for stress test in the morning.       GO/MEDQ  D:  07/06/2004  T:  07/06/2004  Job:  [id number]   cc:   Dario Guardian, M.D.  [address]  [place]  [place] [address]  Fax: [phone]

## 2010-06-19 NOTE — Consult Note (Signed)
Juan [name], Juan [name]              ACCOUNT NO.:  [id number]   MEDICAL RECORD NO.:  [id number]          PATIENT TYPE:  INP   LOCATION:  1405                         FACILITY:  [facility]   PHYSICIAN:  Armanda Magic, M.D.     DATE OF BIRTH:  07/20/1960   DATE OF CONSULTATION:  07/06/2004  DATE OF DISCHARGE:                                   CONSULTATION   REQUESTING PHYSICIAN:  Dr. Julio Sicks   PRIMARY CARE PHYSICIAN:  Dr. Laurann Montana   CHIEF COMPLAINT:  Chest pain.   This is a 50 year old white male who presented to [facility] ER with  approximately a five to six-week history of intermittent chest pressure  occurring at rest and with physical activity.  He says it would last  anywhere from 15 minutes to an hour each, but was not associated with  shortness of breath, nausea, vomiting, diaphoresis and there was no  radiation of the pain.  He says it was not worsened by physical activity.  He does a lot of heavy lifting with his job and has not noticed any change  with physical activity and no association with meals.  He developed an  episode of pain on Friday the 2nd which was constant all weekend and he has  had a history of gastric ulcers in the past but the pain was different.  Apparently had problems sleeping for two evenings and finally presented to  the emergency room for evaluation when sublingual nitroglycerin relieved the  pain.   PAST MEDICAL HISTORY:  1.  Hypertension.  2.  Non-insulin-dependent diabetes mellitus.  3.  Hyperlipidemia.  4.  History of gastric ulcer.  5.  Asthma.  6.  Obesity.   MEDICATIONS:  1.  Aspirin 81 mg daily.  2.  Amaryl 4 mg b.i.d.  3.  Advair inhaler.  4.  Lipitor 80 mg a day.  5.  Lisinopril/HCT 10/12.5 mg a day.  6.  Glucophage 500 mg four tablets every evening.  7.  Albuterol inhaler p.r.n.  8.  Singulair 10 mg a day.   ALLERGIES:  PENICILLIN.   FAMILY HISTORY:  His father died at 65 of an MI, he had diabetes.  His  mother is alive  and well.  He has one brother with diabetes mellitus.   SOCIAL HISTORY:  He is married.  He has two children.  He quit smoking three  years ago.  Occasional alcohol use.  He works at [employer].   REVIEW OF SYSTEMS:  Other than what is stated in HPI is negative.   PHYSICAL EXAMINATION:  VITAL SIGNS:  Blood pressure 127/76, pulse 105 and  regular, respirations 22.  He is afebrile.  O2 saturations on 2 L 97%.  GENERAL:  This is a well-developed, well-nourished male in no acute  distress.  HEENT:  Benign.  NECK:  Supple without lymphadenopathy.  Carotid upstrokes +2 bilaterally, no  bruits.  LUNGS:  Clear to auscultation throughout.  HEART:  Regular rate and rhythm.  No murmurs, rubs, or gallops.  Normal S1,  S2.  ABDOMEN:  Soft, nontender, nondistended.  Normoactive  bowel sounds.  No  hepatosplenomegaly.  Of note, on abdominal examination there is some mild  epigastric tenderness to palpation.  EXTREMITIES:  No edema.   LABORATORIES:  Sodium 131, potassium 3.6, chloride 93, bicarbonate 29, BUN  12, creatinine 1.2, glucose 310.  White cell count 6.8, hematocrit 45.6,  hemoglobin 15.9, platelet count 221, lipase 28.  D-dimer less than 0.22.  PTT 25.  CPK-MBs are less than 1 x3.  Myoglobin 77.6, 71.3, 75.7.  Troponins  all less than 0.05 x3.  EKG shows sinus rhythm with no ischemia.   ASSESSMENT:  1.  Atypical chest pain with negative cardiac markers.  EKG is nonischemic.      Patient is currently pain-free.  Agree with continuing aspirin and      Lovenox.  Would add low-dose Lopressor with mild tachycardia.  2.  Hypertension.  3.  Non-insulin-dependent diabetes mellitus.  4.  Hyperlipidemia.   PLAN:  Stress Cardiolite study in the morning.       TT/MEDQ  D:  07/07/2004  T:  07/07/2004  Job:  [id number]   cc:   Stacie Acres. White, M.D.  [address]  [place]  [place] [address]  Fax: [phone]

## 2011-02-23 ENCOUNTER — Other Ambulatory Visit: Payer: Self-pay | Admitting: Internal Medicine

## 2011-02-23 ENCOUNTER — Ambulatory Visit
Admission: RE | Admit: 2011-02-23 | Discharge: 2011-02-23 | Disposition: A | Discharge status: Home / Self Care | Payer: Worker's Compensation | Source: Ambulatory Visit | Attending: Internal Medicine | Admitting: Internal Medicine

## 2011-02-23 DIAGNOSIS — M25561 Pain in right knee: Secondary | ICD-10-CM

## 2011-03-22 ENCOUNTER — Ambulatory Visit (HOSPITAL_BASED_OUTPATIENT_CLINIC_OR_DEPARTMENT_OTHER)
Admission: RE | Admit: 2011-03-22 | Discharge: 2011-03-22 | Disposition: A | Discharge status: Home / Self Care | Payer: Commercial Indemnity | Source: Ambulatory Visit | Attending: Orthopedic Surgery | Admitting: Orthopedic Surgery

## 2011-03-22 ENCOUNTER — Encounter (HOSPITAL_BASED_OUTPATIENT_CLINIC_OR_DEPARTMENT_OTHER): Payer: Self-pay | Admitting: *Deleted

## 2011-03-22 DIAGNOSIS — Z538 Procedure and treatment not carried out for other reasons: Secondary | ICD-10-CM | POA: Insufficient documentation

## 2011-03-22 DIAGNOSIS — M23305 Other meniscus derangements, unspecified medial meniscus, unspecified knee: Secondary | ICD-10-CM | POA: Insufficient documentation

## 2011-03-23 ENCOUNTER — Encounter (HOSPITAL_BASED_OUTPATIENT_CLINIC_OR_DEPARTMENT_OTHER)
Admission: RE | Admit: 2011-03-23 | Discharge: 2011-03-23 | Disposition: A | Discharge status: Home / Self Care | Payer: Commercial Indemnity | Source: Ambulatory Visit | Attending: Orthopedic Surgery | Admitting: Orthopedic Surgery

## 2011-03-23 LAB — BASIC METABOLIC PANEL
BUN: 14 mg/dL (ref 6–23)
Calcium: 9.9 mg/dL (ref 8.4–10.5)
GFR calc Af Amer: >90 mL/min (ref >90)
GFR calc non Af Amer: >90 mL/min (ref >90)
Potassium: 4.3 mEq/L (ref 3.5–5.1)

## 2011-03-23 NOTE — Progress Notes (Signed)
ekg and chest xr waved per dr singer,  Complete work up feb 1st last year with neg results.

## 2011-03-24 ENCOUNTER — Ambulatory Visit (HOSPITAL_BASED_OUTPATIENT_CLINIC_OR_DEPARTMENT_OTHER): Payer: Worker's Compensation | Admitting: *Deleted

## 2011-03-24 ENCOUNTER — Encounter (HOSPITAL_BASED_OUTPATIENT_CLINIC_OR_DEPARTMENT_OTHER): Payer: Self-pay | Admitting: *Deleted

## 2011-03-24 ENCOUNTER — Other Ambulatory Visit: Payer: Self-pay | Admitting: Orthopedic Surgery

## 2011-03-24 ENCOUNTER — Ambulatory Visit (HOSPITAL_BASED_OUTPATIENT_CLINIC_OR_DEPARTMENT_OTHER)
Admission: RE | Admit: 2011-03-24 | Discharge: 2011-03-24 | Disposition: A | Discharge status: Home / Self Care | Payer: Worker's Compensation | Source: Ambulatory Visit | Attending: Orthopedic Surgery | Admitting: Orthopedic Surgery

## 2011-03-24 ENCOUNTER — Encounter (HOSPITAL_BASED_OUTPATIENT_CLINIC_OR_DEPARTMENT_OTHER)
Admission: RE | Disposition: A | Discharge status: Home / Self Care | Payer: Self-pay | Source: Ambulatory Visit | Attending: Orthopedic Surgery

## 2011-03-24 DIAGNOSIS — I10 Essential (primary) hypertension: Secondary | ICD-10-CM | POA: Insufficient documentation

## 2011-03-24 DIAGNOSIS — J449 Chronic obstructive pulmonary disease, unspecified: Secondary | ICD-10-CM | POA: Insufficient documentation

## 2011-03-24 DIAGNOSIS — M23329 Other meniscus derangements, posterior horn of medial meniscus, unspecified knee: Secondary | ICD-10-CM | POA: Insufficient documentation

## 2011-03-24 DIAGNOSIS — K219 Gastro-esophageal reflux disease without esophagitis: Secondary | ICD-10-CM | POA: Insufficient documentation

## 2011-03-24 DIAGNOSIS — Z87891 Personal history of nicotine dependence: Secondary | ICD-10-CM | POA: Insufficient documentation

## 2011-03-24 DIAGNOSIS — J4489 Other specified chronic obstructive pulmonary disease: Secondary | ICD-10-CM | POA: Insufficient documentation

## 2011-03-24 DIAGNOSIS — M129 Arthropathy, unspecified: Secondary | ICD-10-CM | POA: Insufficient documentation

## 2011-03-24 DIAGNOSIS — M2240 Chondromalacia patellae, unspecified knee: Secondary | ICD-10-CM | POA: Insufficient documentation

## 2011-03-24 DIAGNOSIS — E119 Type 2 diabetes mellitus without complications: Secondary | ICD-10-CM | POA: Insufficient documentation

## 2011-03-24 HISTORY — DX: Essential (primary) hypertension: I10

## 2011-03-24 HISTORY — DX: Shortness of breath: R06.02

## 2011-03-24 HISTORY — PX: KNEE ARTHROSCOPY: SHX127

## 2011-03-24 HISTORY — DX: Chronic obstructive pulmonary disease, unspecified: J44.9

## 2011-03-24 HISTORY — DX: Personal history of other diseases of the digestive system: Z87.19

## 2011-03-24 HISTORY — DX: Gastro-esophageal reflux disease without esophagitis: K21.9

## 2011-03-24 LAB — GLUCOSE, CAPILLARY: Glucose-Capillary: 215 mg/dL — ABNORMAL HIGH (ref 70–99)

## 2011-03-24 SURGERY — ARTHROSCOPY, KNEE
Anesthesia: General | Site: Knee | Laterality: Right | Wound class: Clean

## 2011-03-24 MED ORDER — PROMETHAZINE HCL 25 MG/ML IJ SOLN: 6.2500 mg | INTRAMUSCULAR | Status: DC | PRN

## 2011-03-24 MED ORDER — CLINDAMYCIN PHOSPHATE 600 MG/50ML IV SOLN: 600.0000 mg | INTRAVENOUS | Status: DC

## 2011-03-24 MED ORDER — FENTANYL CITRATE 0.05 MG/ML IJ SOLN
INTRAMUSCULAR | Status: DC | PRN
  Administered 2011-03-24: 100 ug via INTRAVENOUS
  Administered 2011-03-24: 50 ug via INTRAVENOUS

## 2011-03-24 MED ORDER — ONDANSETRON HCL 4 MG/2ML IJ SOLN
INTRAMUSCULAR | Status: DC | PRN
  Administered 2011-03-24: 4 mg via INTRAVENOUS

## 2011-03-24 MED ORDER — PROPOFOL 10 MG/ML IV EMUL
INTRAVENOUS | Status: DC | PRN
  Administered 2011-03-24: 250 mg via INTRAVENOUS

## 2011-03-24 MED ORDER — SODIUM CHLORIDE 0.9 % IR SOLN
Status: DC | PRN
  Administered 2011-03-24: 3000 mL

## 2011-03-24 MED ORDER — LIDOCAINE HCL (CARDIAC) 20 MG/ML IV SOLN
INTRAVENOUS | Status: DC | PRN
  Administered 2011-03-24: 50 mg via INTRAVENOUS

## 2011-03-24 MED ORDER — LACTATED RINGERS IV SOLN
INTRAVENOUS | Status: DC
  Administered 2011-03-24: 09:00:00 via INTRAVENOUS

## 2011-03-24 MED ORDER — HYDROMORPHONE HCL PF 1 MG/ML IJ SOLN
0.2500 mg | INTRAMUSCULAR | Status: DC | PRN
  Administered 2011-03-24 (×2): 0.5 mg via INTRAVENOUS

## 2011-03-24 MED ORDER — BUPIVACAINE HCL (PF) 0.5 % IJ SOLN
INTRAMUSCULAR | Status: DC | PRN
  Administered 2011-03-24: 20 mL

## 2011-03-24 MED ORDER — EPINEPHRINE HCL 1 MG/ML IJ SOLN
INTRAMUSCULAR | Status: DC | PRN
  Administered 2011-03-24: 1 mg

## 2011-03-24 MED ORDER — OXYCODONE-ACETAMINOPHEN 5-325 MG PO TABS: 1.0000 | ORAL_TABLET | Freq: Four times a day (QID) | ORAL | Status: AC | PRN

## 2011-03-24 MED ORDER — MIDAZOLAM HCL 5 MG/5ML IJ SOLN
INTRAMUSCULAR | Status: DC | PRN
  Administered 2011-03-24: 2 mg via INTRAVENOUS

## 2011-03-24 MED ORDER — POVIDONE-IODINE 7.5 % EX SOLN: Freq: Once | CUTANEOUS | Status: DC

## 2011-03-24 SURGICAL SUPPLY — 41 items
BANDAGE ELASTIC 6 VELCRO ST LF (GAUZE/BANDAGES/DRESSINGS) ×2 IMPLANT
BLADE 4.2CUDA (BLADE) IMPLANT
BLADE GREAT WHITE 4.2 (BLADE) ×2 IMPLANT
CANISTER OMNI JUG 16 LITER (MISCELLANEOUS) ×2 IMPLANT
CANISTER SUCTION 2500CC (MISCELLANEOUS) IMPLANT
CLOTH BEACON ORANGE TIMEOUT ST (SAFETY) ×2 IMPLANT
CUTTER MENISCUS  4.2MM (BLADE)
CUTTER MENISCUS 4.2MM (BLADE) IMPLANT
DRAPE ARTHROSCOPY W/POUCH 114 (DRAPES) ×2 IMPLANT
DRSG EMULSION OIL 3X3 NADH (GAUZE/BANDAGES/DRESSINGS) ×2 IMPLANT
DURAPREP 26ML APPLICATOR (WOUND CARE) ×2 IMPLANT
ELECT MENISCUS 165MM 90D (ELECTRODE) IMPLANT
ELECT REM PT RETURN 9FT ADLT (ELECTROSURGICAL)
ELECTRODE REM PT RTRN 9FT ADLT (ELECTROSURGICAL) IMPLANT
GLOVE BIO SURGEON STRL SZ 6.5 (GLOVE) ×2 IMPLANT
GLOVE BIOGEL PI IND STRL 8 (GLOVE) ×2 IMPLANT
GLOVE BIOGEL PI INDICATOR 8 (GLOVE) ×2
GLOVE ECLIPSE 7.5 STRL STRAW (GLOVE) ×4 IMPLANT
GLOVE INDICATOR 7.0 STRL GRN (GLOVE) ×2 IMPLANT
GOWN BRE IMP PREV XXLGXLNG (GOWN DISPOSABLE) ×2 IMPLANT
GOWN PREVENTION PLUS XLARGE (GOWN DISPOSABLE) ×2 IMPLANT
GOWN PREVENTION PLUS XXLARGE (GOWN DISPOSABLE) ×2 IMPLANT
HOLDER KNEE FOAM BLUE (MISCELLANEOUS) ×2 IMPLANT
KNEE WRAP E Z 3 GEL PACK (MISCELLANEOUS) ×2 IMPLANT
NDL SAFETY ECLIPSE 18X1.5 (NEEDLE) IMPLANT
NEEDLE HYPO 18GX1.5 SHARP (NEEDLE)
PACK ARTHROSCOPY DSU (CUSTOM PROCEDURE TRAY) ×2 IMPLANT
PACK BASIN DAY SURGERY FS (CUSTOM PROCEDURE TRAY) ×2 IMPLANT
PAD CAST 4YDX4 CTTN HI CHSV (CAST SUPPLIES) ×2 IMPLANT
PADDING CAST COTTON 4X4 STRL (CAST SUPPLIES) ×2
PADDING WEBRIL 4 STERILE (GAUZE/BANDAGES/DRESSINGS) ×2 IMPLANT
PENCIL BUTTON HOLSTER BLD 10FT (ELECTRODE) IMPLANT
SET ARTHROSCOPY TUBING (MISCELLANEOUS) ×1
SET ARTHROSCOPY TUBING LN (MISCELLANEOUS) ×1 IMPLANT
SPONGE GAUZE 4X4 12PLY (GAUZE/BANDAGES/DRESSINGS) ×2 IMPLANT
SPONGE GAUZE 4X4 FOR O.R. (GAUZE/BANDAGES/DRESSINGS) ×2 IMPLANT
SUT ETHILON 4 0 PS 2 18 (SUTURE) IMPLANT
SYR 5ML LL (SYRINGE) IMPLANT
TOWEL OR 17X24 6PK STRL BLUE (TOWEL DISPOSABLE) ×2 IMPLANT
TOWEL OR NON WOVEN STRL DISP B (DISPOSABLE) ×2 IMPLANT
WATER STERILE IRR 1000ML POUR (IV SOLUTION) ×2 IMPLANT

## 2011-03-24 NOTE — Anesthesia Preprocedure Evaluation (Addendum)
Anesthesia Evaluation  Patient identified by MRN, date of birth, ID band Patient awake    Reviewed: Allergy & Precautions, H&P , NPO status , Patient's Chart, lab work & pertinent test results  History of Anesthesia Complications Negative for: history of anesthetic complications  Airway Mallampati: II TM Distance: >3 FB Neck ROM: Full    Dental  (+) Poor Dentition, Chipped and Dental Advisory Given   Pulmonary asthma , COPD (daily inhalers) COPD inhaler, former smoker (quit 12 years) clear to auscultation  Pulmonary exam normal       Cardiovascular hypertension, Pt. on medications Regular Normal '12 stress test: no ischemia   Neuro/Psych Negative Neurological ROS  Negative Psych ROS   GI/Hepatic Neg liver ROS, GERD-  Medicated and Controlled,  Endo/Other  Diabetes mellitus-, Well Controlled, Type 2, Oral Hypoglycemic AgentsGlu 201 this am  Renal/GU negative Renal ROS     Musculoskeletal  (+) Arthritis -,   Abdominal (+) obese,   Peds  Hematology   Anesthesia Other Findings   Reproductive/Obstetrics                           Anesthesia Physical Anesthesia Plan  ASA: II  Anesthesia Plan: General   Post-op Pain Management:    Induction: Intravenous  Airway Management Planned: LMA  Additional Equipment:   Intra-op Plan:   Post-operative Plan:   Informed Consent: I have reviewed the patients History and Physical, chart, labs and discussed the procedure including the risks, benefits and alternatives for the proposed anesthesia with the patient or authorized representative who has indicated his/her understanding and acceptance.   Dental advisory given  Plan Discussed with: CRNA and Surgeon  Anesthesia Plan Comments: (Plan routine monitors, GA- LMA OK)        Anesthesia Quick Evaluation

## 2011-03-24 NOTE — Discharge Instructions (Signed)
POST-OP KNEE ARTHROSCOPY INSTRUCTIONS  °Dr. John Graves/Jim Feiga Nadel PA-C ° °Pain °You will be expected to have a moderate amount of pain in the affected knee for approximately two weeks. However, the first two days will be the most severe pain. A prescription has been provided to take as needed for the pain. The pain can be reduced by applying ice packs to the knee for the first 1-2 weeks post surgery. Also, keeping the leg elevated on pillows will help alleviate the pain. If you develop any acute pain or swelling in your calf muscle, please call the doctor. ° °Activity °It is preferred that you stay at bed rest for approximately 24 hours. However, you may go to the bathroom with help. Weight bearing as tolerated. You may begin the knee exercises the day of surgery. Discontinue crutches as the knee pain resolves. ° °Dressing °Keep the dressing dry. If the ace bandage should wrinkle or roll up, this can be rewrapped to prevent ridges in the bandage. You may remove all dressings in 48 hours,  apply bandaids to each wound. You may shower on the 4th day after surgery but no tub bath. ° °Symptoms to report to your doctor °Extreme pain °Extreme swelling °Temperature above 101 degrees °Change in the feeling, color, or movement of your toes °Redness, heat, or swelling at your incision ° °Exercise °If is preferred that as soon as possible you try to do a straight leg raise without bending the knee and concentrate on bringing the heel of your foot off the bed up to approximately 45 degrees and hold for the count of 10 seconds. Repeat this at least 10 times three or four times per day. Additional exercises are provided below. ° °You are encouraged to bend the knee as tolerated. ° °Follow-Up °Call to schedule a follow-up appointment in 5-7 days. Our office # is [phone]. ° °POST-OP EXERCISES ° °Short Arc Quads ° °1. Lie on back with legs straight. Place towel roll under thigh, just above knee. °2. Tighten thigh muscles to  straighten knee and lift heel off bed. °3. Hold for slow count of five, then lower. °4. Do three sets of ten ° ° ° °Straight Leg Raises ° °1. Lie on back with operative leg straight and other leg bent. °2. Keeping operative leg completely straight, slowly lift operative leg so foot is 5 inches off bed. °3. Hold for slow count of five, then lower. °4. Do three sets of ten. ° ° ° °DO BOTH EXERCISES 2 TIMES A DAY ° °Ankle Pumps ° °Work/move the operative ankle and foot up and down 10 times every hour while awake. °

## 2011-03-24 NOTE — Anesthesia Procedure Notes (Addendum)
Procedure Name: LMA Insertion Date/Time: 03/24/2011 9:54 AM Performed by: Meyer Russel Pre-anesthesia Checklist: Patient identified, Emergency Drugs available, Suction available, Patient being monitored and Timeout performed Patient Re-evaluated:Patient Re-evaluated prior to inductionOxygen Delivery Method: Circle System Utilized Preoxygenation: Pre-oxygenation with 100% oxygen Intubation Type: IV induction Ventilation: Mask ventilation without difficulty LMA: LMA inserted LMA Size: 5.0 Number of attempts: 1 Airway Equipment and Method: bite block Placement Confirmation: positive ETCO2 and breath sounds checked- equal and bilateral Tube secured with: Tape Dental Injury: Teeth and Oropharynx as per pre-operative assessment    Date/Time: 03/24/2011 9:59 AM Performed by: Meyer Russel

## 2011-03-24 NOTE — Transfer of Care (Signed)
Immediate Anesthesia Transfer of Care Note  Patient: Juan [name]  Procedure(s) Performed: Procedure(s) (LRB): ARTHROSCOPY KNEE (Right)  Patient Location: PACU  Anesthesia Type: General  Level of Consciousness: sedated  Airway & Oxygen Therapy: Patient Spontanous Breathing and Patient connected to face mask oxygen  Post-op Assessment: Report given to PACU RN and Post -op Vital signs reviewed and stable  Post vital signs: Reviewed and stable  Complications: No apparent anesthesia complications

## 2011-03-24 NOTE — Anesthesia Postprocedure Evaluation (Signed)
  Anesthesia Post-op Note  Patient: Juan [name]  Procedure(s) Performed: Procedure(s) (LRB): ARTHROSCOPY KNEE (Right)  Patient Location: PACU  Anesthesia Type: General  Level of Consciousness: awake, alert  and oriented  Airway and Oxygen Therapy: Patient Spontanous Breathing  Post-op Pain: none  Post-op Assessment: Post-op Vital signs reviewed, Patient's Cardiovascular Status Stable, Respiratory Function Stable, Patent Airway, No signs of Nausea or vomiting and Pain level controlled  Post-op Vital Signs: Reviewed and stable  Complications: No apparent anesthesia complications

## 2011-03-24 NOTE — Brief Op Note (Signed)
03/24/2011  10:30 AM  PATIENT:  Tracie Harrier  51 y.o. male  PRE-OPERATIVE DIAGNOSIS:  medial meniscus tear right knee  POST-OPERATIVE DIAGNOSIS:  medial meniscus tear right knee  PROCEDURE:  Procedure(s) (LRB): ARTHROSCOPY KNEE (Right)  SURGEON:  Surgeon(s) and Role:    * Harvie Junior, MD - Primary  PHYSICIAN ASSISTANT:   ASSISTANTS: bethune   ANESTHESIA:   general  EBL:  Total I/O In: 600 [I.V.:600] Out: -   BLOOD ADMINISTERED:none  DRAINS: none   LOCAL MEDICATIONS USED:  MARCAINE     SPECIMEN:  No Specimen  DISPOSITION OF SPECIMEN:  N/A  COUNTS:  YES  TOURNIQUET:  * No tourniquets in log *  DICTATION: .Other Dictation: Dictation Number [id number]  PLAN OF CARE: Discharge to home after PACU  PATIENT DISPOSITION:  PACU - hemodynamically stable.   Delay start of Pharmacological VTE agent (>24hrs) due to surgical blood loss or risk of bleeding: not applicable

## 2011-03-24 NOTE — H&P (Signed)
PREOPERATIVE H&P  Chief Complaint: r. Knee pain  HPI: Juan [name] is a 51 y.o. male who presents for evaluation of r. Knee pain. It has been present for greater than 3 mon and has been worsening.  MRI shows MMT. He has failed conservative measures. Pain is rated as moderate.  Past Medical History  Diagnosis Date  . Asthma   . COPD (chronic obstructive pulmonary disease)   . H/O hiatal hernia   . Shortness of breath   . Hypertension   . GERD (gastroesophageal reflux disease)   . Diabetes mellitus    Past Surgical History  Procedure Date  . No past surgeries    History   Social History  . Marital Status: Single    Spouse Name: N/A    Number of Children: N/A  . Years of Education: N/A   Social History Main Topics  . Smoking status: Never Smoker   . Smokeless tobacco: Current User    Types: Chew  . Alcohol Use: Yes     social  . Drug Use: No  . Sexually Active: No   Other Topics Concern  . None   Social History Narrative  . None   History reviewed. No pertinent family history. Allergies  Allergen Reactions  . Penicillins Rash   Prior to Admission medications   Medication Sig Start Date End Date Taking? Authorizing Provider  aspirin 81 MG tablet Take 81 mg by mouth daily.   Yes Historical Provider, MD  atorvastatin (LIPITOR) 40 MG tablet Take 40 mg by mouth daily.   Yes Historical Provider, MD  fluticasone-salmeterol (ADVAIR HFA) 230-21 MCG/ACT inhaler Inhale 2 puffs into the lungs 2 (two) times daily.   Yes Historical Provider, MD  glimepiride (AMARYL) 4 MG tablet Take 4 mg by mouth 2 (two) times daily.   Yes Historical Provider, MD  lisinopril (PRINIVIL,ZESTRIL) 20 MG tablet Take 20 mg by mouth daily.   Yes Historical Provider, MD  metFORMIN (GLUCOPHAGE) 500 MG tablet Take 500 mg by mouth 4 (four) times daily.   Yes Historical Provider, MD  omeprazole (PRILOSEC) 20 MG capsule Take 20 mg by mouth daily.   Yes Historical Provider, MD  saxagliptin HCl  (ONGLYZA) 2.5 MG TABS tablet Take 5 mg by mouth daily.   Yes Historical Provider, MD  albuterol (PROVENTIL HFA;VENTOLIN HFA) 108 (90 BASE) MCG/ACT inhaler Inhale 2 puffs into the lungs every 6 (six) hours as needed.    Historical Provider, MD     Positive ROS: none  All other systems have been reviewed and were otherwise negative with the exception of those mentioned in the HPI and as above.  Physical Exam: Filed Vitals:   03/24/11 0844  BP: 135/93  Pulse: 98  Temp: 97.9 F (36.6 C)  Resp: 18    General: Alert, no acute distress Cardiovascular: No pedal edema Respiratory: No cyanosis, no use of accessory musculature GI: No organomegaly, abdomen is soft and non-tender Skin: No lesions in the area of chief complaint Neurologic: Sensation intact distally Psychiatric: Patient is competent for consent with normal mood and affect Lymphatic: No axillary or cervical lymphadenopathy  MUSCULOSKELETAL: +TTP med aspect r. Knee No instability.  +Mcmurray. _ lochman  Assessment/Plan: mmt right knee Plan for Procedure(s): ARTHROSCOPY KNEE  The risks benefits and alternatives were discussed with the patient including but not limited to the risks of nonoperative treatment, versus surgical intervention including infection, bleeding, nerve injury, malunion, nonunion, hardware prominence, hardware failure, need for hardware removal, blood clots, cardiopulmonary  complications, morbidity, mortality, among others, and they were willing to proceed.  Predicted outcome is good, although there will be at least a six to nine month expected recovery.  Tilley Faeth L, MD 03/24/2011 9:23 AM

## 2011-03-25 ENCOUNTER — Encounter (HOSPITAL_BASED_OUTPATIENT_CLINIC_OR_DEPARTMENT_OTHER): Payer: Self-pay | Admitting: Orthopedic Surgery

## 2011-03-25 NOTE — Op Note (Signed)
NAME:  BUDD, FREIERMUTH            ACCOUNT NO.:  [id number]  MEDICAL RECORD NO.:  [id number]  LOCATION:                                 FACILITY:  PHYSICIAN:  Harvie Junior, M.D.        DATE OF BIRTH:  DATE OF PROCEDURE:  03/24/2011 DATE OF DISCHARGE:                              OPERATIVE REPORT   He is a 51 year old male.  PREOPERATIVE DIAGNOSIS:  Medial meniscal tear.  POSTOPERATIVE DIAGNOSES: 1. Medial meniscal tear. 2. Chondromalacia patellofemoral joint.  PROCEDURE: 1. Partial posterior horn medial meniscectomy with corresponding     debridement of medial compartment. 2. Chondromalacia patellofemoral joint with debridement down to     bleeding bone in the patellofemoral joint.  SURGEON:  Harvie Junior, M.D.  ASSISTANT:  Marshia Ly, P.A.  ANESTHESIA:  General.  BRIEF HISTORY:  Mr. Brandy is a 51 year old male with long history of significant complaints of right knee pain.  He had been treated conservatively for prolonged period of time.  MRI showed that he had a medial meniscal tear.  He was taken to the operating room for operative knee arthroscopy.  PROCEDURE:  The patient was taken to the operating room.  After adequate level of anesthesia was obtained with general anesthetic, the patient was placed supine on the operating table.  The right leg was then prepped and draped in the usual sterile fashion.  Following this, routine arthroscopic examination of the knee revealed there was an obvious posterior horn medial meniscal tear.  This was debrided with a suction shaver back to a smooth and stable rim.  Once this was completed, attention was turned towards the medial femoral condyle which had some significant chondromalacia.  This was debrided in the medial compartment.  Attention was then turned to the ACL, normal.  Attention was then turned to the lateral side, normal.  The attention was then turned back to the patellofemoral joint where there was a large  area of chondromalacia on the undersurface of the patella.  This was debrided back to a smooth and stable rim, this did go down to bleeding bone.  At this point, the knee was copiously and thoroughly lavaged and suctioned dry.  Final check was made for any loose and fragmented pieces.  Seeing none, the patient was then taken to have Sterile compressive dressing applied to the knee and then he was taken to recovery room and was noted to be in satisfactory condition. Estimated loss for this procedure was none.     Harvie Junior, M.D.     Ranae Plumber  D:  03/24/2011  T:  03/24/2011  Job:  [id number]

## 2011-03-30 ENCOUNTER — Encounter (HOSPITAL_BASED_OUTPATIENT_CLINIC_OR_DEPARTMENT_OTHER): Payer: Self-pay

## 2012-02-02 DIAGNOSIS — J189 Pneumonia, unspecified organism: Secondary | ICD-10-CM

## 2012-02-02 HISTORY — DX: Pneumonia, unspecified organism: J18.9

## 2013-02-28 ENCOUNTER — Observation Stay (HOSPITAL_COMMUNITY): Payer: Managed Care, Other (non HMO)

## 2013-02-28 ENCOUNTER — Encounter (HOSPITAL_COMMUNITY): Payer: Self-pay | Admitting: Emergency Medicine

## 2013-02-28 ENCOUNTER — Observation Stay (HOSPITAL_COMMUNITY)
Admission: EM | Admit: 2013-02-28 | Discharge: 2013-02-28 | Disposition: A | Discharge status: Home / Self Care | Payer: Managed Care, Other (non HMO) | Attending: Internal Medicine | Admitting: Internal Medicine

## 2013-02-28 ENCOUNTER — Emergency Department (HOSPITAL_COMMUNITY): Payer: Managed Care, Other (non HMO)

## 2013-02-28 DIAGNOSIS — Z880 Allergy status to penicillin: Secondary | ICD-10-CM | POA: Insufficient documentation

## 2013-02-28 DIAGNOSIS — E669 Obesity, unspecified: Secondary | ICD-10-CM

## 2013-02-28 DIAGNOSIS — R079 Chest pain, unspecified: Secondary | ICD-10-CM | POA: Diagnosis present

## 2013-02-28 DIAGNOSIS — J189 Pneumonia, unspecified organism: Secondary | ICD-10-CM | POA: Diagnosis present

## 2013-02-28 DIAGNOSIS — J441 Chronic obstructive pulmonary disease with (acute) exacerbation: Secondary | ICD-10-CM | POA: Insufficient documentation

## 2013-02-28 DIAGNOSIS — E785 Hyperlipidemia, unspecified: Secondary | ICD-10-CM

## 2013-02-28 DIAGNOSIS — R0789 Other chest pain: Principal | ICD-10-CM | POA: Insufficient documentation

## 2013-02-28 DIAGNOSIS — E871 Hypo-osmolality and hyponatremia: Secondary | ICD-10-CM | POA: Diagnosis present

## 2013-02-28 DIAGNOSIS — K219 Gastro-esophageal reflux disease without esophagitis: Secondary | ICD-10-CM | POA: Insufficient documentation

## 2013-02-28 DIAGNOSIS — I2510 Atherosclerotic heart disease of native coronary artery without angina pectoris: Secondary | ICD-10-CM | POA: Insufficient documentation

## 2013-02-28 DIAGNOSIS — J45901 Unspecified asthma with (acute) exacerbation: Secondary | ICD-10-CM

## 2013-02-28 DIAGNOSIS — I10 Essential (primary) hypertension: Secondary | ICD-10-CM | POA: Diagnosis present

## 2013-02-28 DIAGNOSIS — E119 Type 2 diabetes mellitus without complications: Secondary | ICD-10-CM | POA: Insufficient documentation

## 2013-02-28 LAB — COMPREHENSIVE METABOLIC PANEL
ALK PHOS: 56 U/L (ref 39–117)
ALT: 28 U/L (ref 0–53)
AST: 20 U/L (ref 0–37)
Albumin: 3.9 g/dL (ref 3.5–5.2)
BILIRUBIN TOTAL: 0.8 mg/dL (ref 0.3–1.2)
BUN: 22 mg/dL (ref 6–23)
CHLORIDE: 97 meq/L (ref 96–112)
CO2: 21 meq/L (ref 19–32)
CREATININE: 1.18 mg/dL (ref 0.50–1.35)
Calcium: 9.2 mg/dL (ref 8.4–10.5)
GFR calc Af Amer: 80 mL/min — ABNORMAL LOW (ref >90)
GFR, EST NON AFRICAN AMERICAN: 69 mL/min — AB (ref >90)
Glucose, Bld: 381 mg/dL — ABNORMAL HIGH (ref 70–99)
POTASSIUM: 5.2 meq/L (ref 3.7–5.3)
Sodium: 135 mEq/L — ABNORMAL LOW (ref 137–147)
Total Protein: 6.9 g/dL (ref 6.0–8.3)

## 2013-02-28 LAB — CBC WITH DIFFERENTIAL/PLATELET
Basophils Absolute: 0 10*3/uL (ref 0.0–0.1)
Basophils Relative: 0 % (ref 0–1)
EOS ABS: 0 10*3/uL (ref 0.0–0.7)
EOS PCT: 0 % (ref 0–5)
HEMATOCRIT: 38.2 % — AB (ref 39.0–52.0)
HEMOGLOBIN: 13.7 g/dL (ref 13.0–17.0)
LYMPHS ABS: 0.5 10*3/uL — AB (ref 0.7–4.0)
Lymphocytes Relative: 8 % — ABNORMAL LOW (ref 12–46)
MCH: 30.9 pg (ref 26.0–34.0)
MCHC: 35.9 g/dL (ref 30.0–36.0)
MCV: 86.2 fL (ref 78.0–100.0)
MONO ABS: 0.1 10*3/uL (ref 0.1–1.0)
MONOS PCT: 1 % — AB (ref 3–12)
Neutro Abs: 6 10*3/uL (ref 1.7–7.7)
Neutrophils Relative %: 91 % — ABNORMAL HIGH (ref 43–77)
Platelets: 154 10*3/uL (ref 150–400)
RBC: 4.43 MIL/uL (ref 4.22–5.81)
RDW: 13.4 % (ref 11.5–15.5)
WBC: 6.6 10*3/uL (ref 4.0–10.5)

## 2013-02-28 LAB — TROPONIN I: Troponin I: <0.3 ng/mL (ref <0.30)

## 2013-02-28 LAB — POCT I-STAT TROPONIN I: TROPONIN I, POC: 0 ng/mL (ref 0.00–0.08)

## 2013-02-28 LAB — LIPASE, BLOOD: Lipase: 46 U/L (ref 11–59)

## 2013-02-28 LAB — GLUCOSE, CAPILLARY
Glucose-Capillary: 284 mg/dL — ABNORMAL HIGH (ref 70–99)
Glucose-Capillary: 313 mg/dL — ABNORMAL HIGH (ref 70–99)

## 2013-02-28 LAB — PRO B NATRIURETIC PEPTIDE: Pro B Natriuretic peptide (BNP): 31.3 pg/mL (ref 0–125)

## 2013-02-28 MED ORDER — LEVOFLOXACIN IN D5W 750 MG/150ML IV SOLN: 750.0000 mg | INTRAVENOUS | Status: DC

## 2013-02-28 MED ORDER — SODIUM CHLORIDE 0.9 % IV SOLN
INTRAVENOUS | Status: DC
  Filled 2013-02-28: qty 1

## 2013-02-28 MED ORDER — GLIMEPIRIDE 4 MG PO TABS: 4.0000 mg | ORAL_TABLET | Freq: Two times a day (BID) | ORAL | Status: DC

## 2013-02-28 MED ORDER — REGADENOSON 0.4 MG/5ML IV SOLN
INTRAVENOUS | Status: AC
  Administered 2013-02-28: 0.4 mg via INTRAVENOUS
  Filled 2013-02-28: qty 5

## 2013-02-28 MED ORDER — POLYETHYLENE GLYCOL 3350 17 G PO PACK
17.0000 g | PACK | Freq: Every day | ORAL | Status: DC | PRN
  Filled 2013-02-28: qty 1

## 2013-02-28 MED ORDER — PREDNISONE 10 MG PO TABS: 10.0000 mg | ORAL_TABLET | Freq: Every day | ORAL | Status: DC

## 2013-02-28 MED ORDER — TECHNETIUM TC 99M SESTAMIBI GENERIC - CARDIOLITE
10.0000 | Freq: Once | INTRAVENOUS | Status: AC | PRN
  Administered 2013-02-28: 10 via INTRAVENOUS

## 2013-02-28 MED ORDER — LEVOFLOXACIN IN D5W 750 MG/150ML IV SOLN
750.0000 mg | Freq: Once | INTRAVENOUS | Status: AC
  Administered 2013-02-28: 750 mg via INTRAVENOUS
  Filled 2013-02-28: qty 150

## 2013-02-28 MED ORDER — GUAIFENESIN-DM 100-10 MG/5ML PO SYRP
5.0000 mL | ORAL_SOLUTION | ORAL | Status: DC | PRN
  Filled 2013-02-28: qty 5

## 2013-02-28 MED ORDER — DEXTROSE 50 % IV SOLN: 25.0000 mL | INTRAVENOUS | Status: DC | PRN

## 2013-02-28 MED ORDER — ALBUTEROL SULFATE (2.5 MG/3ML) 0.083% IN NEBU: 3.0000 mL | INHALATION_SOLUTION | Freq: Four times a day (QID) | RESPIRATORY_TRACT | Status: DC | PRN

## 2013-02-28 MED ORDER — LISINOPRIL-HYDROCHLOROTHIAZIDE 20-25 MG PO TABS: 1.0000 | ORAL_TABLET | Freq: Every day | ORAL | Status: DC

## 2013-02-28 MED ORDER — SODIUM CHLORIDE 0.9 % IV BOLUS (SEPSIS)
1000.0000 mL | Freq: Once | INTRAVENOUS | Status: AC
  Administered 2013-02-28: 1000 mL via INTRAVENOUS

## 2013-02-28 MED ORDER — HEPARIN SODIUM (PORCINE) 5000 UNIT/ML IJ SOLN
5000.0000 [IU] | Freq: Three times a day (TID) | INTRAMUSCULAR | Status: DC
  Filled 2013-02-28 (×3): qty 1

## 2013-02-28 MED ORDER — ATORVASTATIN CALCIUM 40 MG PO TABS
40.0000 mg | ORAL_TABLET | Freq: Every day | ORAL | Status: DC
  Filled 2013-02-28: qty 1

## 2013-02-28 MED ORDER — SODIUM CHLORIDE 0.9 % IJ SOLN: 3.0000 mL | Freq: Two times a day (BID) | INTRAMUSCULAR | Status: DC

## 2013-02-28 MED ORDER — LISINOPRIL 20 MG PO TABS
20.0000 mg | ORAL_TABLET | Freq: Every day | ORAL | Status: DC
Start: 2013-02-28 — End: 2013-02-28
  Filled 2013-02-28: qty 1

## 2013-02-28 MED ORDER — HYDROCHLOROTHIAZIDE 25 MG PO TABS
25.0000 mg | ORAL_TABLET | Freq: Every day | ORAL | Status: DC
  Filled 2013-02-28: qty 1

## 2013-02-28 MED ORDER — REGADENOSON 0.4 MG/5ML IV SOLN
0.4000 mg | Freq: Once | INTRAVENOUS | Status: AC
  Administered 2013-02-28: 0.4 mg via INTRAVENOUS
  Filled 2013-02-28: qty 5

## 2013-02-28 MED ORDER — MOMETASONE FURO-FORMOTEROL FUM 200-5 MCG/ACT IN AERO
2.0000 | INHALATION_SPRAY | Freq: Two times a day (BID) | RESPIRATORY_TRACT | Status: DC
  Filled 2013-02-28: qty 8.8

## 2013-02-28 MED ORDER — TECHNETIUM TC 99M SESTAMIBI GENERIC - CARDIOLITE
30.0000 | Freq: Once | INTRAVENOUS | Status: AC | PRN
  Administered 2013-02-28: 30 via INTRAVENOUS

## 2013-02-28 MED ORDER — MORPHINE SULFATE 4 MG/ML IJ SOLN: 4.0000 mg | Freq: Once | INTRAMUSCULAR | Status: DC | PRN

## 2013-02-28 MED ORDER — INSULIN REGULAR BOLUS VIA INFUSION
0.0000 [IU] | Freq: Three times a day (TID) | INTRAVENOUS | Status: DC
  Filled 2013-02-28: qty 10

## 2013-02-28 MED ORDER — GLIMEPIRIDE 4 MG PO TABS
4.0000 mg | ORAL_TABLET | Freq: Two times a day (BID) | ORAL | Status: DC
  Filled 2013-02-28 (×2): qty 1

## 2013-02-28 MED ORDER — SODIUM CHLORIDE 0.9 % IV SOLN: INTRAVENOUS | Status: DC

## 2013-02-28 MED ORDER — ONDANSETRON HCL 4 MG PO TABS: 4.0000 mg | ORAL_TABLET | Freq: Four times a day (QID) | ORAL | Status: DC | PRN

## 2013-02-28 MED ORDER — HYDROCODONE-ACETAMINOPHEN 5-325 MG PO TABS: 1.0000 | ORAL_TABLET | ORAL | Status: DC | PRN

## 2013-02-28 MED ORDER — ASPIRIN EC 81 MG PO TBEC
81.0000 mg | DELAYED_RELEASE_TABLET | Freq: Every day | ORAL | Status: DC
  Filled 2013-02-28: qty 1

## 2013-02-28 MED ORDER — INSULIN ASPART 100 UNIT/ML ~~LOC~~ SOLN
7.0000 [IU] | Freq: Once | SUBCUTANEOUS | Status: AC
Start: 2013-02-28 — End: 2013-02-28
  Administered 2013-02-28: 7 [IU] via SUBCUTANEOUS

## 2013-02-28 MED ORDER — ONDANSETRON HCL 4 MG/2ML IJ SOLN
4.0000 mg | Freq: Four times a day (QID) | INTRAMUSCULAR | Status: DC | PRN
Start: 2013-02-28 — End: 2013-02-28

## 2013-02-28 MED ORDER — LEVOFLOXACIN 750 MG PO TABS: 750.0000 mg | ORAL_TABLET | Freq: Every day | ORAL | Status: DC

## 2013-02-28 NOTE — Consult Note (Signed)
Admit date: 02/28/2013 Referring Physician  Dr. Candiss Norse Primary Physician Vidal Schwalbe, MD Primary Cardiologist  New Reason for Consultation  Chest pain  HPI: 53 year old male with diabetes, hypertension, hyperlipidemia, former tobacco smoker with recently diagnosed community-acquired pneumonia who has been treated with Levaquin as an outpatient for the last few days who presents to the hospital now with left-sided  chest discomfort radiating to left shoulder (different from his GERD), lasting approximately 40 minutes duration associated with mild shortness of breath and dizziness as well as anxiety. Was working, stepped up on forlift and began to have the pain. Been coughing vigorously.  Currently chest pain-free. Chest x-ray demonstrated pneumonia however this was right upper lobe.  No recent history of fevers, mild cough is noted, nonproductive. No malignancies, no clotting disorder, no recent long travels.  Father died 35 from MI.    PMH:   Past Medical History  Diagnosis Date  . Asthma   . COPD (chronic obstructive pulmonary disease)   . H/O hiatal hernia   . Shortness of breath   . Hypertension   . GERD (gastroesophageal reflux disease)   . Diabetes mellitus     PSH:   Past Surgical History  Procedure Laterality Date  . No past surgeries    . Knee arthroscopy  03/24/2011    Procedure: ARTHROSCOPY KNEE;  Surgeon: Alta Corning, MD;  Location: [place];  Service: Orthopedics;  Laterality: Right;  partial medial menisectomy, chondroplaty patella, and medial medial plica   Allergies:  Penicillins Prior to Admit Meds:   Prior to Admission medications   Medication Sig Start Date End Date Taking? Authorizing Provider  albuterol (PROVENTIL HFA;VENTOLIN HFA) 108 (90 BASE) MCG/ACT inhaler Inhale 1-2 puffs into the lungs every 6 (six) hours as needed for wheezing or shortness of breath.    Yes Historical Provider, MD  aspirin EC 81 MG tablet Take 81 mg by mouth  daily.   Yes Historical Provider, MD  atorvastatin (LIPITOR) 40 MG tablet Take 40 mg by mouth daily.   Yes Historical Provider, MD  fluticasone-salmeterol (ADVAIR HFA) 230-21 MCG/ACT inhaler Inhale 1 puff into the lungs 2 (two) times daily.    Yes Historical Provider, MD  glimepiride (AMARYL) 4 MG tablet Take 4 mg by mouth 2 (two) times daily.   Yes Historical Provider, MD  HYDROcodone-homatropine (HYCODAN) 5-1.5 MG/5ML syrup Take 5-10 mLs by mouth daily as needed. For cough 02/27/13  Yes Historical Provider, MD  KOMBIGLYZE XR 2.06-998 MG TB24 Take 2 tablets by mouth daily. 02/25/13  Yes Historical Provider, MD  levofloxacin (LEVAQUIN) 500 MG tablet Take 1 tablet by mouth daily. For 10 days, started 02/28/12 02/27/13  Yes Historical Provider, MD  lisinopril-hydrochlorothiazide (PRINZIDE,ZESTORETIC) 20-25 MG per tablet Take 1 tablet by mouth daily. 02/25/13  Yes Historical Provider, MD  pioglitazone (ACTOS) 15 MG tablet Take 1 tablet by mouth daily. 02/25/13  Yes Historical Provider, MD  predniSONE (DELTASONE) 20 MG tablet Take 20-60 mg by mouth daily. Take 3 tablets daily for 3 days, 2 tablets daily for 3 days, 1 tablet daily for 3 days, for 9 days supply started 02/27/13 02/27/13  Yes Historical Provider, MD  saxagliptin HCl (ONGLYZA) 2.5 MG TABS tablet Take 2.5 mg by mouth daily.    Yes Historical Provider, MD   Fam HX:   History reviewed. No pertinent family history. Social HX:    History   Social History  . Marital Status: Single    Spouse Name: N/A  Number of Children: N/A  . Years of Education: N/A   Occupational History  . Not on file.   Social History Main Topics  . Smoking status: Never Smoker   . Smokeless tobacco: Current User    Types: Chew  . Alcohol Use: Yes     Comment: social  . Drug Use: No  . Sexual Activity: No   Other Topics Concern  . Not on file   Social History Narrative  . No narrative on file     ROS:  All 11 ROS were addressed and are negative except what  is stated in the HPI   Physical Exam: Blood pressure 127/86, pulse 86, temperature 97.9 F (36.6 C), temperature source Oral, resp. rate 16, height 5\' 10"  (1.778 m), weight 235 lb (106.595 kg), SpO2 95.00%.   General: Well developed, well nourished, in no acute distress Head: Eyes PERRLA, No xanthomas.   Normal cephalic and atramatic  Lungs:   Clear bilaterally to auscultation and percussion. Normal respiratory effort. No wheezes, no rales. Heart:   HRRR S1 S2 Pulses are 2+ & equal. No murmur, rubs, gallops.  No carotid bruit. No JVD.  No abdominal bruits.  Abdomen: Obese. Bowel sounds are positive, abdomen soft and non-tender without masses. No hepatosplenomegaly. Msk:  Back normal. Normal strength and tone for age. Extremities:  No clubbing, cyanosis or edema.  DP +1 Neuro: Alert and oriented X 3, non-focal, MAE x 4 GU: Deferred Rectal: Deferred Psych:  Good affect, responds appropriately      Labs: Lab Results  Component Value Date   WBC 6.6 02/28/2013   HGB 13.7 02/28/2013   HCT 38.2* 02/28/2013   MCV 86.2 02/28/2013   PLT 154 02/28/2013     Recent Labs Lab 02/28/13 0814  NA 135*  K 5.2  CL 97  CO2 21  BUN 22  CREATININE 1.18  CALCIUM 9.2  PROT 6.9  BILITOT 0.8  ALKPHOS 56  ALT 28  AST 20  GLUCOSE 381*    Recent Labs  02/28/13 0944  TROPONINI <0.30   Lab Results  Component Value Date   CHOL  Value: 90        ATP III CLASSIFICATION:  <200     mg/dL   Desirable  200-239  mg/dL   Borderline High  >=240    mg/dL   High        03/05/2010   HDL 32* 03/05/2010   LDLCALC  Value: 45        Total Cholesterol/HDL:CHD Risk Coronary Heart Disease Risk Table                     Men   Women  1/2 Average Risk   3.4   3.3  Average Risk       5.0   4.4  2 X Average Risk   9.6   7.1  3 X Average Risk  23.4   11.0        Use the calculated Patient Ratio above and the CHD Risk Table to determine the patient's CHD Risk.        ATP III CLASSIFICATION (LDL):  <100     mg/dL   Optimal   100-129  mg/dL   Near or Above                    Optimal  130-159  mg/dL   Borderline  160-189  mg/dL   High  >190  mg/dL   Very High 03/05/2010   TRIG 66 03/05/2010   No results found for this basename: DDIMER     Radiology:  Dg Chest 2 View  02/28/2013   CLINICAL DATA:  Chest pain.  Shortness of breath.  Cough.  EXAM: CHEST  2 VIEW  COMPARISON:  Chest x-ray 03/04/2010.  FINDINGS: Ill-defined right upper lobe opacity may represent an area of developing bronchopneumonia. No other confluent consolidative airspace disease. No pleural effusions. No evidence of pulmonary edema. Heart size is normal. Mediastinal contours are unremarkable.  IMPRESSION: 1. Findings concerning for potential early right upper lobe bronchopneumonia. Repeat radiographs are recommended in 2-3 weeks following appropriate trial of antimicrobial therapy to ensure resolution of this finding.   Electronically Signed   By: Vinnie Langton M.D.   On: 02/28/2013 08:37   Personally viewed.  EKG:  Sinus tachycardia rate 102 without any ST segment changes. Slightly low voltage likely from body habitus. No significant change from prior EKG other than increased heart rate. Personally viewed.   ASSESSMENT/PLAN:   53 year old male with chest discomfort and coronary disease equivalent diabetes, hypertension, hyperlipidemia, obesity with right upper lobe pneumonia (afebrile, normal white count).  1. Chest pain-his chest pain was left-sided, opposite side of developing pneumonia. Differential does include musculoskeletal secondary to cough however given his strong cardiac risk factors, diabetes I would like to pursue further testing, nuclear stress test. I called [facility] and they should be able to perform today.   2. Diabetes-hemoglobin A1c pending. Multiple drug regimen. Per primary team. Glucose elevated on admission.  3. Hyperlipidemia-currently on statin therapy. Excellent.  4. Hypertension-currently on ACE inhibitor, renal protection  and diabetes, diabetic. Excellent.  5. Obesity-strongly encourage weight loss to reduce cardiac risk factor, diabetes.  6. Hyponatremia-mild, sodium 135. May be secondary to diuretic at home. Monitor clinically.  Candee Furbish, MD  02/28/2013  11:29 AM

## 2013-02-28 NOTE — ED Notes (Signed)
Pt undressed, in gown, on monitor, continuous pulse oximetry and blood pressure cuff 

## 2013-02-28 NOTE — H&P (Signed)
Patient Demographics  Juan [name], is a 53 y.o. male  MRN: [id number]   DOB - [birth year]  Admit Date - 02/28/2013  Outpatient Primary MD for the patient is Vidal Schwalbe, MD   With History of -  Past Medical History  Diagnosis Date  . Asthma   . COPD (chronic obstructive pulmonary disease)   . H/O hiatal hernia   . Shortness of breath   . Hypertension   . GERD (gastroesophageal reflux disease)   . Diabetes mellitus       Past Surgical History  Procedure Laterality Date  . No past surgeries    . Knee arthroscopy  03/24/2011    Procedure: ARTHROSCOPY KNEE;  Surgeon: Alta Corning, MD;  Location: [place];  Service: Orthopedics;  Laterality: Right;  partial medial menisectomy, chondroplaty patella, and medial medial plica    in for   Chief Complaint  Patient presents with  . Chest Pain  . Shortness of Breath     HPI  Juan [name]  is a 53 y.o. male, with history of type 2 diabetes mellitus, hypertension, dyslipidemia, chews tobacco and quit smoking more than 10 years ago who was recently diagnosed with community-acquired pneumonia few days ago and started on Levaquin presents to the hospital after an episode of left-sided sharp chest pain which was nonradiating, lasting about 40 minutes which happened this morning, no aggravating relieving factors, associated with some shortness of breath and dizziness, came into the ER where initial EKG and troponins were unremarkable, chest x-ray confirmed pneumonia, he was afebrile with no leukocytosis. Currently chest pain-free and I was called to admit the patient for further chest pain workup.   Patient himself denies any ongoing chest pain at this time, no fevers or body aches, does have a cough which is dry, no recent travels, no personal or  family history of DVT or PE, no swelling in the lower extremities.   Review of Systems    In addition to the HPI above,  No Fever-chills, No Headache, No changes with Vision or hearing, No problems swallowing food or Liquids, Positive Chest pain, positive Cough and Shortness of Breath, No Abdominal pain, No Nausea or Vommitting, Bowel movements are regular, No Blood in stool or Urine, No dysuria, No new skin rashes or bruises, No new joints pains-aches,  No new weakness, tingling, numbness in any extremity, No recent weight gain or loss, No polyuria, polydypsia or polyphagia, No significant Mental Stressors.  A full 10 point Review of Systems was done, except as stated above, all other Review of Systems were negative.   Social History History  Substance Use Topics  . Smoking status: Never Smoker   . Smokeless tobacco: Current User    Types: Chew  . Alcohol Use: Yes     Comment: social      Family History CAD in his father  Prior to Admission medications   Medication Sig Start Date End Date  Taking? Authorizing Provider  albuterol (PROVENTIL HFA;VENTOLIN HFA) 108 (90 BASE) MCG/ACT inhaler Inhale 1-2 puffs into the lungs every 6 (six) hours as needed for wheezing or shortness of breath.    Yes Historical Provider, MD  aspirin EC 81 MG tablet Take 81 mg by mouth daily.   Yes Historical Provider, MD  atorvastatin (LIPITOR) 40 MG tablet Take 40 mg by mouth daily.   Yes Historical Provider, MD  fluticasone-salmeterol (ADVAIR HFA) 230-21 MCG/ACT inhaler Inhale 1 puff into the lungs 2 (two) times daily.    Yes Historical Provider, MD  glimepiride (AMARYL) 4 MG tablet Take 4 mg by mouth 2 (two) times daily.   Yes Historical Provider, MD  HYDROcodone-homatropine (HYCODAN) 5-1.5 MG/5ML syrup Take 5-10 mLs by mouth daily as needed. For cough 02/27/13  Yes Historical Provider, MD  KOMBIGLYZE XR 2.06-998 MG TB24 Take 2 tablets by mouth daily. 02/25/13  Yes Historical Provider, MD    levofloxacin (LEVAQUIN) 500 MG tablet Take 1 tablet by mouth daily. For 10 days, started 02/28/12 02/27/13  Yes Historical Provider, MD  lisinopril-hydrochlorothiazide (PRINZIDE,ZESTORETIC) 20-25 MG per tablet Take 1 tablet by mouth daily. 02/25/13  Yes Historical Provider, MD  pioglitazone (ACTOS) 15 MG tablet Take 1 tablet by mouth daily. 02/25/13  Yes Historical Provider, MD  predniSONE (DELTASONE) 20 MG tablet Take 20-60 mg by mouth daily. Take 3 tablets daily for 3 days, 2 tablets daily for 3 days, 1 tablet daily for 3 days, for 9 days supply started 02/27/13 02/27/13  Yes Historical Provider, MD  saxagliptin HCl (ONGLYZA) 2.5 MG TABS tablet Take 2.5 mg by mouth daily.    Yes Historical Provider, MD    Allergies  Allergen Reactions  . Penicillins Rash    Physical Exam  Vitals  Blood pressure 127/81, pulse 103, temperature 98.2 F (36.8 C), temperature source Oral, resp. rate 16, SpO2 97.00%.   1. General middle-aged obese Caucasian male lying in bed in NAD,     2. Normal affect and insight, Not Suicidal or Homicidal, Awake Alert, Oriented X 3.  3. No F.N deficits, ALL C.Nerves Intact, Strength 5/5 all 4 extremities, Sensation intact all 4 extremities, Plantars down going.  4. Ears and Eyes appear Normal, Conjunctivae clear, PERRLA. Moist Oral Mucosa.  5. Supple Neck, No JVD, No cervical lymphadenopathy appriciated, No Carotid Bruits.  6. Symmetrical Chest wall movement, Good air movement bilaterally, CTAB.  7. RRR, No Gallops, Rubs or Murmurs, No Parasternal Heave.  8. Positive Bowel Sounds, Abdomen Soft, Non tender, No organomegaly appriciated,No rebound -guarding or rigidity.  9.  No Cyanosis, Normal Skin Turgor, No Skin Rash or Bruise.  10. Good muscle tone,  joints appear normal , no effusions, Normal ROM.  11. No Palpable Lymph Nodes in Neck or Axillae    Data Review  CBC  Recent Labs Lab 02/28/13 0814  WBC 6.6  HGB 13.7  HCT 38.2*  PLT 154  MCV 86.2  MCH  30.9  MCHC 35.9  RDW 13.4  LYMPHSABS 0.5*  MONOABS 0.1  EOSABS 0.0  BASOSABS 0.0   ------------------------------------------------------------------------------------------------------------------  Chemistries   Recent Labs Lab 02/28/13 0814  NA 135*  K 5.2  CL 97  CO2 21  GLUCOSE 381*  BUN 22  CREATININE 1.18  CALCIUM 9.2  AST 20  ALT 28  ALKPHOS 56  BILITOT 0.8   ------------------------------------------------------------------------------------------------------------------ CrCl is unknown because both a height and weight (above a minimum accepted value) are required for this calculation. ------------------------------------------------------------------------------------------------------------------ No results found for  this basename: TSH, T4TOTAL, FREET3, T3FREE, THYROIDAB,  in the last 72 hours   Coagulation profile No results found for this basename: INR, PROTIME,  in the last 168 hours ------------------------------------------------------------------------------------------------------------------- No results found for this basename: DDIMER,  in the last 72 hours -------------------------------------------------------------------------------------------------------------------  Cardiac Enzymes No results found for this basename: CK, CKMB, TROPONINI, MYOGLOBIN,  in the last 168 hours ------------------------------------------------------------------------------------------------------------------ No components found with this basename: POCBNP,    ---------------------------------------------------------------------------------------------------------------  Urinalysis No results found for this basename: colorurine, appearanceur, labspec, phurine, glucoseu, hgbur, bilirubinur, ketonesur, proteinur, urobilinogen, nitrite, leukocytesur     ----------------------------------------------------------------------------------------------------------------  Imaging results:   Dg Chest 2 View  02/28/2013   CLINICAL DATA:  Chest pain.  Shortness of breath.  Cough.  EXAM: CHEST  2 VIEW  COMPARISON:  Chest x-ray 03/04/2010.  FINDINGS: Ill-defined right upper lobe opacity may represent an area of developing bronchopneumonia. No other confluent consolidative airspace disease. No pleural effusions. No evidence of pulmonary edema. Heart size is normal. Mediastinal contours are unremarkable.  IMPRESSION: 1. Findings concerning for potential early right upper lobe bronchopneumonia. Repeat radiographs are recommended in 2-3 weeks following appropriate trial of antimicrobial therapy to ensure resolution of this finding.   Electronically Signed   By: Vinnie Langton M.D.   On: 02/28/2013 08:37    My personal review of EKG: Rhythm NSR,  no Acute ST changes    Assessment & Plan   1. Left-sided chest pain. He does have risk factors for CAD, his heart score is 4, she will be admitted on telemetry bed for 23 hour observation, troponin will be cycled, will be placed on aspirin, have requested cardiology to evaluate the patient for further diagnostic testing. He is currently chest pain-free.    2. CAP. Currently stable, seems to be responding to Levaquin well which will be continued, sputum cultures along with strep pneumonia and Legionella antigen ordered. Oxygen nebulizer treatments as needed. Continue prednisone low dose which was started by PCP at 10 mg a day    3. DM type II. Check A1c, Poor glycemic control as he was recently started on prednisone by PCP for pneumonia/bronchitis, will hold Glucophage and Actos, Amaryl and Trajenta will be continued, will place on IV glucose stabilizer as glycemic control is poor and she may become n.p.o. after midnight and safer on stabilizer drip with frequent Accu-Cheks.    4. Dyslipidemia. Continue  home dose statin    5. Hypertension continue home dose ACE/diuretic regimen.      DVT Prophylaxis Heparin   AM Labs Ordered, also please review Full Orders  Family Communication: Admission, patients condition and plan of care including tests being ordered have been discussed with the patient and wife who indicate understanding and agree with the plan and Code Status.  Code Status full  Likely DC to  home  Condition Fair  Time spent in minutes : 35    SINGH,PRASHANT K M.D on 02/28/2013 at 10:38 AM  Between 7am to 7pm - Pager - [phone]  After 7pm go to [email or web address] - password [email or web address]  And look for the night coverage person covering me after hours  [facility] Office  [phone]

## 2013-02-28 NOTE — ED Provider Notes (Signed)
CSN: [id number]     Arrival date & time 02/28/13  0751 History   First MD Initiated Contact with Patient 02/28/13 0757     Chief Complaint  Patient presents with  . Chest Pain  . Shortness of Breath   (Consider location/radiation/quality/duration/timing/severity/associated sxs/prior Treatment) HPI Comments: Patient is a 53 year old male past medical history significant for asthma, COPD, hypertension, hyperlipidemia, diabetes mellitus, GERD presented to the emergency department for acute onset central chest pain with radiation to left side of chest and epigastrium with associated shortness of breath. Patient describes his pain as sharp pressure. He states his pain has been alleviated with the aspirin and sublingual nitroglycerin given in the ambulance. Patient denies any history of chest pain previously. He states he is currently being treated for bronchitis with prednisone, Levaquin, and an inhaler. Patient denies any diaphoresis, fevers, nausea, vomiting, abdominal pain. Patient had a stress test 7 or 8 years ago without acute finding. He has never had an echocardiogram or cardiac catheterization. Patient states his father died of atrial fibrillation.   Past Medical History  Diagnosis Date  . Asthma   . COPD (chronic obstructive pulmonary disease)   . H/O hiatal hernia   . Shortness of breath   . Hypertension   . GERD (gastroesophageal reflux disease)   . Diabetes mellitus    Past Surgical History  Procedure Laterality Date  . No past surgeries    . Knee arthroscopy  03/24/2011    Procedure: ARTHROSCOPY KNEE;  Surgeon: Alta Corning, MD;  Location: [facility];  Service: Orthopedics;  Laterality: Right;  partial medial menisectomy, chondroplaty patella, and medial medial plica   History reviewed. No pertinent family history. History  Substance Use Topics  . Smoking status: Never Smoker   . Smokeless tobacco: Current User    Types: Chew  . Alcohol Use: Yes     Comment:  social    Review of Systems  Constitutional: Negative for fever and chills.  Respiratory: Positive for shortness of breath.   Cardiovascular: Positive for chest pain.  Gastrointestinal: Negative for nausea, vomiting and diarrhea.  All other systems reviewed and are negative.    Allergies  Penicillins  Home Medications   No current outpatient prescriptions on file. BP 138/79  Pulse 112  Temp(Src) 97.9 F (36.6 C) (Oral)  Resp 16  Ht 5\' 10"  (1.778 m)  Wt 235 lb (106.595 kg)  BMI 33.72 kg/m2  SpO2 95% Physical Exam  Constitutional: He is oriented to person, place, and time. He appears well-developed and well-nourished. No distress.  HENT:  Head: Normocephalic and atraumatic.  Right Ear: External ear normal.  Left Ear: External ear normal.  Nose: Nose normal.  Mouth/Throat: No oropharyngeal exudate.  Eyes: Conjunctivae are normal.  Neck: Neck supple.  Cardiovascular: Normal rate, regular rhythm, normal heart sounds and intact distal pulses.   No carotid bruits  Pulmonary/Chest: Effort normal and breath sounds normal. No respiratory distress. He exhibits no tenderness.  Abdominal: Soft. Bowel sounds are normal. There is no tenderness.  Musculoskeletal: Normal range of motion. He exhibits no edema.  Neurological: He is alert and oriented to person, place, and time.  Skin: Skin is warm and dry. He is not diaphoretic.    ED Course  Procedures (including critical care time) Medications  morphine 4 MG/ML injection 4 mg (not administered)  atorvastatin (LIPITOR) tablet 40 mg (not administered)  mometasone-formoterol (DULERA) 200-5 MCG/ACT inhaler 2 puff (2 puffs Inhalation Not Given 02/28/13 1200)  albuterol (PROVENTIL) (  2.5 MG/3ML) 0.083% nebulizer solution 3 mL (not administered)  aspirin EC tablet 81 mg (not administered)  predniSONE (DELTASONE) tablet 10 mg (10 mg Oral Not Given 02/28/13 1108)  HYDROcodone-acetaminophen (NORCO/VICODIN) 5-325 MG per tablet 1-2 tablet (not  administered)  ondansetron (ZOFRAN) tablet 4 mg (not administered)    Or  ondansetron (ZOFRAN) injection 4 mg (not administered)  guaiFENesin-dextromethorphan (ROBITUSSIN DM) 100-10 MG/5ML syrup 5 mL (not administered)  insulin regular bolus via infusion 0-10 Units (not administered)  insulin regular (NOVOLIN R,HUMULIN R) 1 Units/mL in sodium chloride 0.9 % 100 mL infusion (not administered)  dextrose 50 % solution 25 mL (not administered)  heparin injection 5,000 Units (not administered)  sodium chloride 0.9 % injection 3 mL (not administered)  polyethylene glycol (MIRALAX / GLYCOLAX) packet 17 g (not administered)  0.9 %  sodium chloride infusion (not administered)  levofloxacin (LEVAQUIN) IVPB 750 mg (not administered)  lisinopril (PRINIVIL,ZESTRIL) tablet 20 mg (not administered)    And  hydrochlorothiazide (HYDRODIURIL) tablet 25 mg (not administered)  sodium chloride 0.9 % bolus 1,000 mL (0 mLs Intravenous Stopped 02/28/13 0925)  levofloxacin (LEVAQUIN) IVPB 750 mg (750 mg Intravenous New Bag/Given 02/28/13 0925)  insulin aspart (novoLOG) injection 7 Units (7 Units Subcutaneous Given 02/28/13 1229)  regadenoson (LEXISCAN) injection SOLN 0.4 mg (0.4 mg Intravenous Given 02/28/13 1354)    Labs Review Labs Reviewed  CBC WITH DIFFERENTIAL - Abnormal; Notable for the following:    HCT 38.2 (*)    Neutrophils Relative % 91 (*)    Lymphocytes Relative 8 (*)    Lymphs Abs 0.5 (*)    Monocytes Relative 1 (*)    All other components within normal limits  COMPREHENSIVE METABOLIC PANEL - Abnormal; Notable for the following:    Sodium 135 (*)    Glucose, Bld 381 (*)    GFR calc non Af Amer 69 (*)    GFR calc Af Amer 80 (*)    All other components within normal limits  GLUCOSE, CAPILLARY - Abnormal; Notable for the following:    Glucose-Capillary 284 (*)    All other components within normal limits  CULTURE, EXPECTORATED SPUTUM-ASSESSMENT  LIPASE, BLOOD  PRO B NATRIURETIC PEPTIDE   TROPONIN I  TROPONIN I  TROPONIN I  LEGIONELLA ANTIGEN, URINE  STREP PNEUMONIAE URINARY ANTIGEN  POCT I-STAT TROPONIN I   Imaging Review Dg Chest 2 View  02/28/2013   CLINICAL DATA:  Chest pain.  Shortness of breath.  Cough.  EXAM: CHEST  2 VIEW  COMPARISON:  Chest x-ray 03/04/2010.  FINDINGS: Ill-defined right upper lobe opacity may represent an area of developing bronchopneumonia. No other confluent consolidative airspace disease. No pleural effusions. No evidence of pulmonary edema. Heart size is normal. Mediastinal contours are unremarkable.  IMPRESSION: 1. Findings concerning for potential early right upper lobe bronchopneumonia. Repeat radiographs are recommended in 2-3 weeks following appropriate trial of antimicrobial therapy to ensure resolution of this finding.   Electronically Signed   By: Vinnie Langton M.D.   On: 02/28/2013 08:37    EKG Interpretation    Date/Time:  Wednesday February 28 2013 07:58:05 EST Ventricular Rate:  102 PR Interval:  152 QRS Duration: 91 QT Interval:  327 QTC Calculation: 426 R Axis:   35 Text Interpretation:  Sinus tachycardia Borderline low voltage, extremity leads Prior EKG with normal rate, otherwise unchanged Confirmed by DOCHERTY  MD, MEGAN ([id number]) on 02/28/2013 8:13:51 AM            MDM   1.  Chest pain   2. DM2 (diabetes mellitus, type 2)   3. Dyslipidemia   4. HTN (hypertension)   5. Community acquired pneumonia   6. Hyposmolality and/or hyponatremia   7. Obesity, unspecified     Filed Vitals:   02/28/13 1356  BP: 138/79  Pulse: 112  Temp:   Resp:     Afebrile, NAD, non-toxic appearing, AAOx4. Concern for cardiac etiology of Chest Pain. Hospitalist has been consulted and will see patient in the ED for likely admit. Pt does not meet criteria for CP protocol and a further evaluation is recommended. Pt has been re-evaluated prior to consult and VSS, NAD, heart RRR, pain 0/10, lungs CTAB. CXR shows PNA which patient is being  treated for as outpatient with Levaquin. No acute abnormalities found on EKG and first round of cardiac enzymes negative. This case was discussed with Dr. Tawnya Crook who has seen the patient and agrees with plan to admit.       Harlow Mares, PA-C 02/28/13 1356

## 2013-02-28 NOTE — ED Provider Notes (Signed)
Medical screening examination/treatment/procedure(s) were performed by non-physician practitioner and as supervising physician I was immediately available for consultation/collaboration.  EKG Interpretation    Date/Time:  Wednesday February 28 2013 07:58:05 EST Ventricular Rate:  102 PR Interval:  152 QRS Duration: 91 QT Interval:  327 QTC Calculation: 426 R Axis:   35 Text Interpretation:  Sinus tachycardia Borderline low voltage, extremity leads Prior EKG with normal rate, otherwise unchanged Confirmed by Leary  MD, MEGAN ([id number]) on 02/28/2013 8:13:51 AM              Neta Ehlers, MD 02/28/13 2022

## 2013-02-28 NOTE — ED Notes (Signed)
Per EMS: Per pt. Has c/o sudden onset of chest pain while at work. Pt. Is being treated for Bronchi tus and is on his second dose of Prednisone. Pt. Was given ASA 324mg , and one SL Nitro. Pt. Has c/o 2/10 chest pain. Pt. Is noted with wet sounding cough and wheezing.  Pt. Had a BS 436

## 2013-02-28 NOTE — ED Notes (Signed)
Report called to Stephanie, RN.

## 2013-02-28 NOTE — Discharge Instructions (Signed)
Follow with Primary MD Vidal Schwalbe, MD in 7 days   Get CBC, CMP, checked 7 days by Primary MD and again as instructed by your Primary MD. Get a 2 view Chest X ray done next visit if you had Pneumonia of Lung problems at the Hospital.   Activity: As tolerated with Full fall precautions use walker/cane & assistance as needed   Disposition Home      Diet: Heart Healthy - Low carb   For Heart failure patients - Check your Weight same time everyday, if you gain over 2 pounds, or you develop in leg swelling, experience more shortness of breath or chest pain, call your Primary MD immediately. Follow Cardiac Low Salt Diet and 1.8 lit/day fluid restriction.   On your next visit with her primary care physician please Get Medicines reviewed and adjusted.  Please request your Prim.MD to go over all Hospital Tests and Procedure/Radiological results at the follow up, please get all Hospital records sent to your Prim MD by signing hospital release before you go home.   If you experience worsening of your admission symptoms, develop shortness of breath, life threatening emergency, suicidal or homicidal thoughts you must seek medical attention immediately by calling 911 or calling your MD immediately  if symptoms less severe.  You Must read complete instructions/literature along with all the possible adverse reactions/side effects for all the Medicines you take and that have been prescribed to you. Take any new Medicines after you have completely understood and accpet all the possible adverse reactions/side effects.   Do not drive and provide baby sitting services if your were admitted for syncope or siezures until you have seen by Primary MD or a Neurologist and advised to do so again.  Do not drive when taking Pain medications.    Do not take more than prescribed Pain, Sleep and Anxiety Medications  Special Instructions: If you have smoked or chewed Tobacco  in the last 2 yrs please stop  smoking, stop any regular Alcohol  and or any Recreational drug use.  Wear Seat belts while driving.   Please note  You were cared for by a hospitalist during your hospital stay. If you have any questions about your discharge medications or the care you received while you were in the hospital after you are discharged, you can call the unit and asked to speak with the hospitalist on call if the hospitalist that took care of you is not available. Once you are discharged, your primary care physician will handle any further medical issues. Please note that NO REFILLS for any discharge medications will be authorized once you are discharged, as it is imperative that you return to your primary care physician (or establish a relationship with a primary care physician if you do not have one) for your aftercare needs so that they can reassess your need for medications and monitor your lab values.

## 2013-02-28 NOTE — Discharge Summary (Signed)
Juan [name], is a 53 y.o. male  DOB 02-Aug-1960  MRN [id number].  Admission date:  02/28/2013  Admitting Physician  Thurnell Lose, MD  Discharge Date:  02/28/2013   Primary MD  Vidal Schwalbe, MD  Recommendations for primary care physician for things to follow:    follow CBG closely, repeat CXR, CBC, BMP in 1 week   Admission Diagnosis  HTN (hypertension) [401.9] Dyslipidemia [272.4] DM2 (diabetes mellitus, type 2) [250.00] Chest pain [786.50]  Discharge Diagnosis  Atypical Chest Pain  Active Problems:   Chest pain   HTN (hypertension)   DM2 (diabetes mellitus, type 2)   Dyslipidemia   Hyposmolality and/or hyponatremia   Obesity, unspecified   Community acquired pneumonia      Past Medical History  Diagnosis Date  . Asthma   . COPD (chronic obstructive pulmonary disease)   . H/O hiatal hernia   . Shortness of breath   . Hypertension   . GERD (gastroesophageal reflux disease)   . Diabetes mellitus     Past Surgical History  Procedure Laterality Date  . No past surgeries    . Knee arthroscopy  03/24/2011    Procedure: ARTHROSCOPY KNEE;  Surgeon: Alta Corning, MD;  Location: [facility];  Service: Orthopedics;  Laterality: Right;  partial medial menisectomy, chondroplaty patella, and medial medial plica     Discharge Condition: stable    Follow UP  Follow-up Information   Follow up with WHITE,CYNTHIA S, MD. Schedule an appointment as soon as possible for a visit in 1 week.   Specialty:  Family Medicine   Contact information:   [address], [place] [address] [phone]       Follow up with Candee Furbish, MD. Schedule an appointment as soon as possible for a visit in 2 weeks.   Specialty:  Cardiology   Contact information:   [id number] [place] [address] [phone]         Consults obtained - Cards   Discharge Medications      Medication List         albuterol 108 (90 BASE) MCG/ACT inhaler  Commonly known as:  PROVENTIL HFA;VENTOLIN HFA  Inhale 1-2 puffs into the lungs every 6 (six) hours as needed for wheezing or shortness of breath.     aspirin EC 81 MG tablet  Take 81 mg by mouth daily.     atorvastatin 40 MG tablet  Commonly known as:  LIPITOR  Take 40 mg by mouth daily.     fluticasone-salmeterol 230-21 MCG/ACT inhaler  Commonly known as:  ADVAIR HFA  Inhale 1 puff into the lungs 2 (two) times daily.     glimepiride 4 MG tablet  Commonly known as:  AMARYL  Take 4 mg by mouth 2 (two) times daily.     HYDROcodone-homatropine 5-1.5 MG/5ML syrup  Commonly known as:  HYCODAN  Take 5-10 mLs by mouth daily as needed. For cough     KOMBIGLYZE  XR 2.06-998 MG Tb24  Generic drug:  Saxagliptin-Metformin  Take 2 tablets by mouth daily.     levofloxacin 750 MG tablet  Commonly known as:  LEVAQUIN  Take 1 tablet (750 mg total) by mouth daily. For 10 days, started 02/28/12     lisinopril-hydrochlorothiazide 20-25 MG per tablet  Commonly known as:  PRINZIDE,ZESTORETIC  Take 1 tablet by mouth daily.     pioglitazone 15 MG tablet  Commonly known as:  ACTOS  Take 1 tablet by mouth daily.     predniSONE 20 MG tablet  Commonly known as:  DELTASONE  Take 20-60 mg by mouth daily. Take 3 tablets daily for 3 days, 2 tablets daily for 3 days, 1 tablet daily for 3 days, for 9 days supply started 02/27/13     saxagliptin HCl 2.5 MG Tabs tablet  Commonly known as:  ONGLYZA  Take 2.5 mg by mouth daily.         Diet and Activity recommendation: See Discharge Instructions below   Discharge Instructions     Follow with Primary MD Vidal Schwalbe, MD in 7 days   Get CBC, CMP, checked 7 days by Primary MD and again as instructed by your Primary MD. Get a 2 view Chest X ray done next visit if you  had Pneumonia of Lung problems at the Hospital.   Activity: As tolerated with Full fall precautions use walker/cane & assistance as needed   Disposition Home      Diet: Heart Healthy - Low carb   For Heart failure patients - Check your Weight same time everyday, if you gain over 2 pounds, or you develop in leg swelling, experience more shortness of breath or chest pain, call your Primary MD immediately. Follow Cardiac Low Salt Diet and 1.8 lit/day fluid restriction.   On your next visit with her primary care physician please Get Medicines reviewed and adjusted.  Please request your Prim.MD to go over all Hospital Tests and Procedure/Radiological results at the follow up, please get all Hospital records sent to your Prim MD by signing hospital release before you go home.   If you experience worsening of your admission symptoms, develop shortness of breath, life threatening emergency, suicidal or homicidal thoughts you must seek medical attention immediately by calling 911 or calling your MD immediately  if symptoms less severe.  You Must read complete instructions/literature along with all the possible adverse reactions/side effects for all the Medicines you take and that have been prescribed to you. Take any new Medicines after you have completely understood and accpet all the possible adverse reactions/side effects.   Do not drive and provide baby sitting services if your were admitted for syncope or siezures until you have seen by Primary MD or a Neurologist and advised to do so again.  Do not drive when taking Pain medications.    Do not take more than prescribed Pain, Sleep and Anxiety Medications  Special Instructions: If you have smoked or chewed Tobacco  in the last 2 yrs please stop smoking, stop any regular Alcohol  and or any Recreational drug use.  Wear Seat belts while driving.   Please note  You were cared for by a hospitalist during your hospital stay. If you have  any questions about your discharge medications or the care you received while you were in the hospital after you are discharged, you can call the unit and asked to speak with the hospitalist on call if the hospitalist that took care of  you is not available. Once you are discharged, your primary care physician will handle any further medical issues. Please note that NO REFILLS for any discharge medications will be authorized once you are discharged, as it is imperative that you return to your primary care physician (or establish a relationship with a primary care physician if you do not have one) for your aftercare needs so that they can reassess your need for medications and monitor your lab values.     Major procedures and Radiology Reports - PLEASE review detailed and final reports for all details, in brief -       Dg Chest 2 View  02/28/2013   CLINICAL DATA:  Chest pain.  Shortness of breath.  Cough.  EXAM: CHEST  2 VIEW  COMPARISON:  Chest x-ray 03/04/2010.  FINDINGS: Ill-defined right upper lobe opacity may represent an area of developing bronchopneumonia. No other confluent consolidative airspace disease. No pleural effusions. No evidence of pulmonary edema. Heart size is normal. Mediastinal contours are unremarkable.  IMPRESSION: 1. Findings concerning for potential early right upper lobe bronchopneumonia. Repeat radiographs are recommended in 2-3 weeks following appropriate trial of antimicrobial therapy to ensure resolution of this finding.   Electronically Signed   By: Trudie Reed M.D.   On: 02/28/2013 08:37   Nm Myocar Multi W/spect W/wall Motion / Ef  02/28/2013   CLINICAL DATA:  Chest pressure.  Shortness of breath.  EXAM: MYOCARDIAL IMAGING WITH SPECT (REST AND PHARMACOLOGIC-STRESS)  GATED LEFT VENTRICULAR WALL MOTION STUDY  LEFT VENTRICULAR EJECTION FRACTION  TECHNIQUE: Standard myocardial SPECT imaging was performed after resting intravenous injection of 10 mCi Tc-84m sestamibi.  Subsequently, intravenous infusion of Lexiscan was performed under the supervision of the Cardiology staff. At peak effect of the drug, 30 mCi Tc-5m sestamibi was injected intravenously and standard myocardial SPECT imaging was performed. Quantitative gated imaging was also performed to evaluate left ventricular wall motion, and estimate left ventricular ejection fraction.  COMPARISON:  CT 03/04/2010. Nuclear medicine myocardial scan 07/07/2004.  FINDINGS: SPECT images demonstrate normal left ventricular activity. There are no fixed or reversible perfusion defects.  Gated images were reviewed and demonstrate normal left ventricular wall motion and systolic thickening. The QGS ejection fraction calculated at rest is 66% with an end-diastolic volume of 81ml and an end-systolic volume of 27ml.  IMPRESSION: Stable normal examination without evidence of pharmacological induced myocardial ischemia. The calculated ejection fraction is 66%.   Electronically Signed   By: Roxy Horseman M.D.   On: 02/28/2013 15:24    Micro Results      No results found for this or any previous visit (from the past 240 hour(s)).   History of present illness and  Hospital Course:     Kindly see H&P for history of present illness and admission details, please review complete Labs, Consult reports and Test reports for all details in brief Juan [name], is a 53 y.o. male, patient with history of 2 diabetes mellitus, hypertension, dyslipidemia, who is undergoing treatment with Levaquin for community-acquired pneumonia was admitted with atypical chest pain musculoskeletal, he had non-acute EKG, troponin was negative, was seen by cardiology and underwent a nuclear stress test which showed a preserved EF of around 60% with no reversible ischemia. He was cleared for home discharge by cardiology.    He be discharged home on his home medications, the only change I making is increasing his Levaquin to 750 mg daily from 500 for  community-acquired pneumonia. We'll request PCP to repeat  CBC, BMP and a 2 view chest x-ray in a week. Follow glycemic control closely.      Today   Subjective:   Juan [name] today has no headache,no chest abdominal pain,no new weakness tingling or numbness, feels much better wants to go home today.    Objective:   Blood pressure 136/79, pulse 107, temperature 97.9 F (36.6 C), temperature source Oral, resp. rate 16, height 5\' 10"  (1.778 m), weight 106.595 kg (235 lb), SpO2 95.00%.  No intake or output data in the 24 hours ending 02/28/13 1558  Exam Awake Alert, Oriented *3, No new F.N deficits, Normal affect [place].AT,PERRAL Supple Neck,No JVD, No cervical lymphadenopathy appriciated.  Symmetrical Chest wall movement, Good air movement bilaterally, CTAB RRR,No Gallops,Rubs or new Murmurs, No Parasternal Heave +ve B.Sounds, Abd Soft, Non tender, No organomegaly appriciated, No rebound -guarding or rigidity. No Cyanosis, Clubbing or edema, No new Rash or bruise  Data Review   CBC w Diff:  Lab Results  Component Value Date   WBC 6.6 02/28/2013   HGB 13.7 02/28/2013   HCT 38.2* 02/28/2013   PLT 154 02/28/2013   LYMPHOPCT 8* 02/28/2013   MONOPCT 1* 02/28/2013   EOSPCT 0 02/28/2013   BASOPCT 0 02/28/2013    CMP:  Lab Results  Component Value Date   NA 135* 02/28/2013   K 5.2 02/28/2013   CL 97 02/28/2013   CO2 21 02/28/2013   BUN 22 02/28/2013   CREATININE 1.18 02/28/2013   PROT 6.9 02/28/2013   ALBUMIN 3.9 02/28/2013   BILITOT 0.8 02/28/2013   ALKPHOS 56 02/28/2013   AST 20 02/28/2013   ALT 28 02/28/2013  .   Total Time in preparing paper work, data evaluation and todays exam - 35 minutes  Thurnell Lose M.D on 02/28/2013 at 3:58 PM  [facility] Office  [phone]

## 2013-02-28 NOTE — Progress Notes (Signed)
Inpatient Diabetes Program Recommendations  AACE/ADA: New Consensus Statement on Inpatient Glycemic Control (2013)  Target Ranges:  Prepandial:   less than 140 mg/dL      Peak postprandial:   less than 180 mg/dL (1-2 hours)      Critically ill patients:  140 - 180 mg/dL   Received page from RN caring for patient: Pt ordered IV insulin drip to be started at this time. Although glucose is elevated, correction insulin can be ordered to normalize glucose down rather than an hourly drip.    Inpatient Diabetes Program Recommendations Insulin - IV drip/GlucoStabilizer: Pt does not appear to need an insulin drip under present circumstances.   Correction (SSI): When NPO and no acidosis, correction q 4 hrs is typically the standard. Would use moderate to resistant Insulin - Meal Coverage: Noted to be NPO at this time today for cardiology to see in am tomorow. Oral Agents: Noted Amaryl 4 mg ordered as well.  If patient is NPO, would not want to use this sulfonylurea.  Defiinitely would not use in addition to an iinsulin drip whether po or npo. Recommendc Correction q 4 hrs as the best option at this time. HgbA1C: latest result from 2012 @ 9.1%. However, noted patient's regimen has changed to add Onglyza as well.  Have paged/text paged Dr Candiss Norse with my recommendations.  Thank you, Rosita Kea, RN, CNS, Diabetes Coordinator [phone])

## 2013-02-28 NOTE — ED Notes (Signed)
Patient transported to X-ray 

## 2013-11-23 ENCOUNTER — Ambulatory Visit
Admission: RE | Admit: 2013-11-23 | Discharge: 2013-11-23 | Disposition: A | Discharge status: Home / Self Care | Payer: Managed Care, Other (non HMO) | Source: Ambulatory Visit | Attending: Family Medicine | Admitting: Family Medicine

## 2013-11-23 ENCOUNTER — Other Ambulatory Visit (HOSPITAL_COMMUNITY): Payer: Self-pay | Admitting: Family Medicine

## 2013-11-23 DIAGNOSIS — R042 Hemoptysis: Secondary | ICD-10-CM

## 2013-11-23 MED ORDER — IOHEXOL 350 MG/ML SOLN
125.0000 mL | Freq: Once | INTRAVENOUS | Status: AC | PRN
  Administered 2013-11-23: 125 mL via INTRAVENOUS

## 2013-11-27 ENCOUNTER — Encounter (HOSPITAL_COMMUNITY): Payer: Self-pay | Admitting: Emergency Medicine

## 2013-11-27 ENCOUNTER — Emergency Department (HOSPITAL_COMMUNITY)
Admission: EM | Admit: 2013-11-27 | Discharge: 2013-11-27 | Disposition: A | Discharge status: Home / Self Care | Payer: Managed Care, Other (non HMO) | Attending: Emergency Medicine | Admitting: Emergency Medicine

## 2013-11-27 ENCOUNTER — Emergency Department (HOSPITAL_COMMUNITY): Payer: Managed Care, Other (non HMO)

## 2013-11-27 DIAGNOSIS — Z880 Allergy status to penicillin: Secondary | ICD-10-CM | POA: Diagnosis not present

## 2013-11-27 DIAGNOSIS — J159 Unspecified bacterial pneumonia: Secondary | ICD-10-CM | POA: Diagnosis not present

## 2013-11-27 DIAGNOSIS — Z794 Long term (current) use of insulin: Secondary | ICD-10-CM | POA: Insufficient documentation

## 2013-11-27 DIAGNOSIS — Z79899 Other long term (current) drug therapy: Secondary | ICD-10-CM | POA: Diagnosis not present

## 2013-11-27 DIAGNOSIS — R05 Cough: Secondary | ICD-10-CM

## 2013-11-27 DIAGNOSIS — J439 Emphysema, unspecified: Secondary | ICD-10-CM

## 2013-11-27 DIAGNOSIS — Z7982 Long term (current) use of aspirin: Secondary | ICD-10-CM | POA: Insufficient documentation

## 2013-11-27 DIAGNOSIS — E119 Type 2 diabetes mellitus without complications: Secondary | ICD-10-CM | POA: Diagnosis not present

## 2013-11-27 DIAGNOSIS — I10 Essential (primary) hypertension: Secondary | ICD-10-CM | POA: Insufficient documentation

## 2013-11-27 DIAGNOSIS — J189 Pneumonia, unspecified organism: Secondary | ICD-10-CM

## 2013-11-27 DIAGNOSIS — K219 Gastro-esophageal reflux disease without esophagitis: Secondary | ICD-10-CM | POA: Insufficient documentation

## 2013-11-27 DIAGNOSIS — R059 Cough, unspecified: Secondary | ICD-10-CM

## 2013-11-27 LAB — CBC WITH DIFFERENTIAL/PLATELET
Basophils Absolute: 0 10*3/uL (ref 0.0–0.1)
Basophils Relative: 0 % (ref 0–1)
EOS ABS: 0.2 10*3/uL (ref 0.0–0.7)
EOS PCT: 2 % (ref 0–5)
HCT: 40.8 % (ref 39.0–52.0)
Hemoglobin: 14.3 g/dL (ref 13.0–17.0)
LYMPHS ABS: 1.4 10*3/uL (ref 0.7–4.0)
Lymphocytes Relative: 17 % (ref 12–46)
MCH: 31.2 pg (ref 26.0–34.0)
MCHC: 35 g/dL (ref 30.0–36.0)
MCV: 88.9 fL (ref 78.0–100.0)
Monocytes Absolute: 0.7 10*3/uL (ref 0.1–1.0)
Monocytes Relative: 9 % (ref 3–12)
Neutro Abs: 5.8 10*3/uL (ref 1.7–7.7)
Neutrophils Relative %: 72 % (ref 43–77)
PLATELETS: 183 10*3/uL (ref 150–400)
RBC: 4.59 MIL/uL (ref 4.22–5.81)
RDW: 13.4 % (ref 11.5–15.5)
WBC: 8.1 10*3/uL (ref 4.0–10.5)

## 2013-11-27 LAB — BASIC METABOLIC PANEL
Anion gap: 13 (ref 5–15)
BUN: 23 mg/dL (ref 6–23)
CALCIUM: 9.5 mg/dL (ref 8.4–10.5)
CO2: 26 mEq/L (ref 19–32)
Chloride: 98 mEq/L (ref 96–112)
Creatinine, Ser: 1.07 mg/dL (ref 0.50–1.35)
GFR, EST NON AFRICAN AMERICAN: 78 mL/min — AB (ref >90)
GLUCOSE: 121 mg/dL — AB (ref 70–99)
Potassium: 4.2 mEq/L (ref 3.7–5.3)
Sodium: 137 mEq/L (ref 137–147)

## 2013-11-27 MED ORDER — PREDNISONE 20 MG PO TABS
60.0000 mg | ORAL_TABLET | Freq: Once | ORAL | Status: AC
  Administered 2013-11-27: 60 mg via ORAL
  Filled 2013-11-27: qty 3

## 2013-11-27 MED ORDER — PREDNISONE 20 MG PO TABS: 60.0000 mg | ORAL_TABLET | Freq: Every day | ORAL | Status: DC

## 2013-11-27 MED ORDER — LIDOCAINE 5 % EX OINT
TOPICAL_OINTMENT | Freq: Once | CUTANEOUS | Status: DC
  Filled 2013-11-27: qty 35.44

## 2013-11-27 NOTE — Discharge Instructions (Signed)
Please read and follow all provided instructions.  Your diagnoses today include:  1. Cough   2. Community acquired pneumonia   3. Pulmonary emphysema, unspecified emphysema type     Tests performed today include:  Blood counts and electrolytes  Chest x-ray -- shows pneumonia  Vital signs. See below for your results today.   Medications prescribed:   Prednisone - steroid medicine   It is best to take this medication in the morning to prevent sleeping problems. If you are diabetic, monitor your blood sugar closely and stop taking Prednisone if blood sugar is over 300. Take with food to prevent stomach upset.   Take any prescribed medications only as directed.  Home care instructions:  Follow any educational materials contained in this packet.  Complete the course of azithromycin prescribed to you.   Use your home albuterol for shortness of breath as prescribed.   BE VERY CAREFUL not to take multiple medicines containing Tylenol (also called acetaminophen). Doing so can lead to an overdose which can damage your liver and cause liver failure and possibly death.   Follow-up instructions: Please follow-up with your primary care provider later today as planned.   Return instructions:   Please return to the Emergency Department if you experience worsening symptoms.   Return immediately with worsening breathing, worsening shortness of breath, or if you feel it is taking you more effort to breathe.   Please return if you have any other emergent concerns.  Additional Information:  Your vital signs today were: BP 124/76   Pulse 96   Temp(Src) 97.2 F (36.2 C) (Oral)   Resp 19   SpO2 98% If your blood pressure (BP) was elevated above 135/85 this visit, please have this repeated by your doctor within one month. --------------

## 2013-11-27 NOTE — ED Provider Notes (Signed)
Medical screening examination/treatment/procedure(s) were performed by non-physician practitioner and as supervising physician I was immediately available for consultation/collaboration.   EKG Interpretation   Date/Time:  Tuesday November 27 2013 01:21:15 EDT Ventricular Rate:  93 PR Interval:  141 QRS Duration: 89 QT Interval:  343 QTC Calculation: 427 R Axis:   25 Text Interpretation:  Sinus rhythm Probable left atrial enlargement No  significant change was found Confirmed by Florina Ou  MD, Jenny Reichmann ([id number]) on  11/27/2013 2:42:39 AM        Wynetta Fines, MD 11/27/13 [id number]

## 2013-11-27 NOTE — ED Provider Notes (Signed)
CSN: [id number]     Arrival date & time 11/27/13  0105 History   First MD Initiated Contact with Patient 11/27/13 0140     Chief Complaint  Patient presents with  . Pneumonia    (Consider location/radiation/quality/duration/timing/severity/associated sxs/prior Treatment) HPI Comments: Patient with history of COPD, asthma presents with complaint of cough, shortness of breath, and hemoptysis for the past 5 days. Patient saw his primary care physician who ordered a CT of his chest that revealed multilobar bronchopneumonia. Patient was placed on azithromycin. He has completed 4 days of azithromycin. Tonight patient had noted increasing fatigue and worsening shortness of breath. He was not wheezing. He did not take any of his home medications for breathing. Patient with no documented fever. No chest pain. No abdominal pain.he states in the past with episodes of pneumonia as needed prednisone to help resolve the pneumonia.  Patient is a 53 y.o. male presenting with pneumonia. The history is provided by the patient and medical records.  Pneumonia Associated symptoms include coughing and fatigue. Pertinent negatives include no abdominal pain, chest pain, fever, headaches, myalgias, nausea, rash, sore throat or vomiting.    Past Medical History  Diagnosis Date  . Asthma   . COPD (chronic obstructive pulmonary disease)   . H/O hiatal hernia   . Shortness of breath   . Hypertension   . GERD (gastroesophageal reflux disease)   . Diabetes mellitus    Past Surgical History  Procedure Laterality Date  . No past surgeries    . Knee arthroscopy  03/24/2011    Procedure: ARTHROSCOPY KNEE;  Surgeon: Alta Corning, MD;  Location: [facility];  Service: Orthopedics;  Laterality: Right;  partial medial menisectomy, chondroplaty patella, and medial medial plica   No family history on file. History  Substance Use Topics  . Smoking status: Never Smoker   . Smokeless tobacco: Current User   Types: Chew  . Alcohol Use: Yes     Comment: social    Review of Systems  Constitutional: Positive for activity change and fatigue. Negative for fever.  HENT: Negative for rhinorrhea and sore throat.   Eyes: Negative for redness.  Respiratory: Positive for cough and shortness of breath. Negative for wheezing.   Cardiovascular: Negative for chest pain and leg swelling.  Gastrointestinal: Negative for nausea, vomiting, abdominal pain and diarrhea.  Genitourinary: Negative for dysuria.  Musculoskeletal: Negative for myalgias.  Skin: Negative for rash.  Neurological: Negative for headaches.   Allergies  Penicillins  Home Medications   Prior to Admission medications   Medication Sig Start Date End Date Taking? Authorizing Provider  albuterol (PROVENTIL HFA;VENTOLIN HFA) 108 (90 BASE) MCG/ACT inhaler Inhale 1-2 puffs into the lungs every 6 (six) hours as needed for wheezing or shortness of breath.    Yes Historical Provider, MD  aspirin EC 81 MG tablet Take 81 mg by mouth daily.   Yes Historical Provider, MD  atorvastatin (LIPITOR) 40 MG tablet Take 40 mg by mouth daily.   Yes Historical Provider, MD  fluticasone-salmeterol (ADVAIR HFA) 230-21 MCG/ACT inhaler Inhale 1 puff into the lungs 2 (two) times daily.    Yes Historical Provider, MD  glimepiride (AMARYL) 4 MG tablet Take 4 mg by mouth 2 (two) times daily.   Yes Historical Provider, MD  insulin glargine (LANTUS) 100 UNIT/ML injection Inject 12 Units into the skin daily.   Yes Historical Provider, MD  KOMBIGLYZE XR 2.06-998 MG TB24 Take 2 tablets by mouth daily. 02/25/13  Yes Historical Provider,  MD  lisinopril-hydrochlorothiazide (PRINZIDE,ZESTORETIC) 20-25 MG per tablet Take 1 tablet by mouth daily. 02/25/13  Yes Historical Provider, MD  Multiple Vitamin (MULTIVITAMIN WITH MINERALS) TABS tablet Take 1 tablet by mouth daily.   Yes Historical Provider, MD  saxagliptin HCl (ONGLYZA) 2.5 MG TABS tablet Take 2.5 mg by mouth daily.    Yes  Historical Provider, MD   BP 144/82  Pulse 96  Temp(Src) 97.2 F (36.2 C) (Oral)  Resp 16  SpO2 99%  Physical Exam  Nursing note and vitals reviewed. Constitutional: He appears well-developed and well-nourished.  HENT:  Head: Normocephalic and atraumatic.  Mouth/Throat: Oropharynx is clear and moist.  Eyes: Conjunctivae are normal. Right eye exhibits no discharge. Left eye exhibits no discharge.  Neck: Normal range of motion. Neck supple.  Cardiovascular: Normal rate, regular rhythm and normal heart sounds.   No murmur heard. Pulmonary/Chest: Effort normal and breath sounds normal. No respiratory distress. He has no wheezes. He has no rales.  Abdominal: Soft. There is no tenderness. There is no rebound and no guarding.  Neurological: He is alert.  Skin: Skin is warm and dry.  Psychiatric: He has a normal mood and affect.    ED Course  Procedures (including critical care time) Labs Review Labs Reviewed  BASIC METABOLIC PANEL - Abnormal; Notable for the following:    Glucose, Bld 121 (*)    GFR calc non Af Amer 78 (*)    All other components within normal limits  CBC WITH DIFFERENTIAL    Imaging Review Dg Chest 2 View  11/27/2013   CLINICAL DATA:  Cough, weakness, on antibiotics for pneumonia.  EXAM: CHEST  2 VIEW  COMPARISON:  11/23/2013 CT  FINDINGS: Mild residual right middle/lower lobe nodular airspace opacities. No pleural effusion or pneumothorax. Cardiomediastinal contours within normal range. No acute osseous finding.  IMPRESSION: Mild residual right middle/lower lobe nodular airspace opacities.   Electronically Signed   By: Carlos Levering M.D.   On: 11/27/2013 02:15     EKG Interpretation   Date/Time:  Tuesday November 27 2013 01:21:15 EDT Ventricular Rate:  93 PR Interval:  141 QRS Duration: 89 QT Interval:  343 QTC Calculation: 427 R Axis:   25 Text Interpretation:  Sinus rhythm Probable left atrial enlargement No  significant change was found Confirmed  by MOLPUS  MD, Jenny Reichmann ([id number]) on  11/27/2013 2:42:39 AM      2:23 AM Patient seen and examined. Work-up initiated. Currently no SOB at rest. Will check labs, ambulate with pulse ox.   Vital signs reviewed and are as follows: BP 144/82  Pulse 96  Temp(Src) 97.2 F (36.2 C) (Oral)  Resp 16  SpO2 99%  4:25 AM Patient discussed with Dr. Florina Ou. Patient exam is stable. He is not currently short of breath.   Will start on burst of prednisone due to history of COPD. Patient encouraged to use home albuterol as directed by PCP.   He is to follow-up with his PCP today as planned.    MDM   Final diagnoses:  Community acquired pneumonia  Pulmonary emphysema, unspecified emphysema type   Patient with previously diagnosed PNA -- Presents with increasing fatigue and intermittent shortness of breath. In the emergency department, symptoms are well controlled. His exam is unconcerning. His white blood cell count is normal. Patient has ambulated without oxygen desaturation. Patient appears well, nontoxic. No indication to suspect no etiology such as PE. At this point, I do not feel that the patient needs admitted to hospital  for COPD exacerbation. We will start him on prednisone, as he has had improvement with this in the past. Also patient encouraged to use his albuterol inhaler for shortness of breath as he has not been doing this prior.    Carlisle Cater, PA-C 11/27/13 [phone]

## 2013-11-27 NOTE — ED Notes (Signed)
Pt states that he has had cough, congestion since Thurs; pt states that he saw his PCP on Friday and was diagnosed with Pneumonia; pt states that he is feeling short of breath this evening; pt reports productive cough w/ light brownish mucuos

## 2014-05-13 ENCOUNTER — Emergency Department (HOSPITAL_COMMUNITY): Payer: Managed Care, Other (non HMO)

## 2014-05-13 ENCOUNTER — Encounter (HOSPITAL_COMMUNITY): Payer: Self-pay | Admitting: Emergency Medicine

## 2014-05-13 ENCOUNTER — Emergency Department (HOSPITAL_COMMUNITY)
Admission: EM | Admit: 2014-05-13 | Discharge: 2014-05-14 | Disposition: A | Discharge status: Home / Self Care | Payer: Managed Care, Other (non HMO) | Attending: Emergency Medicine | Admitting: Emergency Medicine

## 2014-05-13 DIAGNOSIS — E119 Type 2 diabetes mellitus without complications: Secondary | ICD-10-CM | POA: Diagnosis not present

## 2014-05-13 DIAGNOSIS — I10 Essential (primary) hypertension: Secondary | ICD-10-CM | POA: Insufficient documentation

## 2014-05-13 DIAGNOSIS — Z7952 Long term (current) use of systemic steroids: Secondary | ICD-10-CM | POA: Diagnosis not present

## 2014-05-13 DIAGNOSIS — K219 Gastro-esophageal reflux disease without esophagitis: Secondary | ICD-10-CM | POA: Diagnosis not present

## 2014-05-13 DIAGNOSIS — R042 Hemoptysis: Secondary | ICD-10-CM | POA: Diagnosis present

## 2014-05-13 DIAGNOSIS — Z794 Long term (current) use of insulin: Secondary | ICD-10-CM | POA: Insufficient documentation

## 2014-05-13 DIAGNOSIS — Z880 Allergy status to penicillin: Secondary | ICD-10-CM | POA: Insufficient documentation

## 2014-05-13 DIAGNOSIS — Z7982 Long term (current) use of aspirin: Secondary | ICD-10-CM | POA: Diagnosis not present

## 2014-05-13 DIAGNOSIS — J40 Bronchitis, not specified as acute or chronic: Secondary | ICD-10-CM

## 2014-05-13 DIAGNOSIS — J440 Chronic obstructive pulmonary disease with acute lower respiratory infection: Secondary | ICD-10-CM | POA: Diagnosis not present

## 2014-05-13 DIAGNOSIS — Z79899 Other long term (current) drug therapy: Secondary | ICD-10-CM | POA: Diagnosis not present

## 2014-05-13 LAB — COMPREHENSIVE METABOLIC PANEL
ALBUMIN: 4.4 g/dL (ref 3.5–5.2)
ALT: 38 U/L (ref 0–53)
ANION GAP: 7 (ref 5–15)
AST: 29 U/L (ref 0–37)
Alkaline Phosphatase: 52 U/L (ref 39–117)
BILIRUBIN TOTAL: 1.6 mg/dL — AB (ref 0.3–1.2)
BUN: 25 mg/dL — AB (ref 6–23)
CO2: 28 mmol/L (ref 19–32)
Calcium: 9.3 mg/dL (ref 8.4–10.5)
Chloride: 100 mmol/L (ref 96–112)
Creatinine, Ser: 1.15 mg/dL (ref 0.50–1.35)
GFR calc Af Amer: 82 mL/min — ABNORMAL LOW (ref >90)
GFR, EST NON AFRICAN AMERICAN: 71 mL/min — AB (ref >90)
Glucose, Bld: 302 mg/dL — ABNORMAL HIGH (ref 70–99)
POTASSIUM: 3.9 mmol/L (ref 3.5–5.1)
Sodium: 135 mmol/L (ref 135–145)
Total Protein: 7.4 g/dL (ref 6.0–8.3)

## 2014-05-13 LAB — CBC WITH DIFFERENTIAL/PLATELET
Basophils Absolute: 0 10*3/uL (ref 0.0–0.1)
Basophils Relative: 0 % (ref 0–1)
EOS ABS: 0.2 10*3/uL (ref 0.0–0.7)
EOS PCT: 3 % (ref 0–5)
HEMATOCRIT: 39.5 % (ref 39.0–52.0)
Hemoglobin: 13.8 g/dL (ref 13.0–17.0)
LYMPHS ABS: 1.2 10*3/uL (ref 0.7–4.0)
Lymphocytes Relative: 19 % (ref 12–46)
MCH: 31.2 pg (ref 26.0–34.0)
MCHC: 34.9 g/dL (ref 30.0–36.0)
MCV: 89.4 fL (ref 78.0–100.0)
MONOS PCT: 8 % (ref 3–12)
Monocytes Absolute: 0.5 10*3/uL (ref 0.1–1.0)
Neutro Abs: 4.4 10*3/uL (ref 1.7–7.7)
Neutrophils Relative %: 70 % (ref 43–77)
Platelets: 163 10*3/uL (ref 150–400)
RBC: 4.42 MIL/uL (ref 4.22–5.81)
RDW: 13.7 % (ref 11.5–15.5)
WBC: 6.3 10*3/uL (ref 4.0–10.5)

## 2014-05-13 LAB — TYPE AND SCREEN
ABO/RH(D): A POS
Antibody Screen: NEGATIVE

## 2014-05-13 NOTE — ED Notes (Signed)
Patient states this evening he began coughing up blood. Reports history of same several months ago, dx with walking pneumonia at that time. Patient states the amount is small, is bright red in nature. Takes baby aspirin.

## 2014-05-14 LAB — D-DIMER, QUANTITATIVE (NOT AT ARMC)

## 2014-05-14 LAB — I-STAT TROPONIN, ED: TROPONIN I, POC: 0 ng/mL (ref 0.00–0.08)

## 2014-05-14 LAB — ABO/RH: ABO/RH(D): A POS

## 2014-05-14 MED ORDER — PREDNISONE 20 MG PO TABS
60.0000 mg | ORAL_TABLET | Freq: Once | ORAL | Status: AC
  Administered 2014-05-14: 60 mg via ORAL
  Filled 2014-05-14: qty 3

## 2014-05-14 MED ORDER — LEVOFLOXACIN 750 MG PO TABS: 750.0000 mg | ORAL_TABLET | Freq: Every day | ORAL | Status: DC

## 2014-05-14 MED ORDER — LEVOFLOXACIN 750 MG PO TABS
750.0000 mg | ORAL_TABLET | Freq: Once | ORAL | Status: AC
  Administered 2014-05-14: 750 mg via ORAL
  Filled 2014-05-14: qty 1

## 2014-05-14 MED ORDER — PREDNISONE 20 MG PO TABS: 40.0000 mg | ORAL_TABLET | Freq: Once | ORAL | Status: DC

## 2014-05-14 NOTE — ED Provider Notes (Signed)
CSN: [id number]     Arrival date & time 05/13/14  2150 History   First MD Initiated Contact with Patient 05/14/14 0013     Chief Complaint  Patient presents with  . Hemoptysis     (Consider location/radiation/quality/duration/timing/severity/associated sxs/prior Treatment) HPI Comments: Patient here with cough. One episode of blood-streaked mucus today. Has had increasing cough and increasing mucus over the past few days. Also had adult chest ache. This was similar to when he had walking pneumonia last fall. At that time had positive chest x-ray for pneumonia. Here chest x-ray is negative.  Patient is a 54 y.o. male presenting with cough. The history is provided by the patient.  Cough Cough characteristics:  Productive Sputum characteristics:  Bloody Severity:  Mild Onset quality:  Gradual Timing:  Constant Progression:  Worsening Chronicity:  New Smoker: no   Context: upper respiratory infection   Relieved by:  Nothing Worsened by:  Nothing tried Associated symptoms: chest pain (dull ache for 2 days)   Associated symptoms: no chills, no fever and no shortness of breath     Past Medical History  Diagnosis Date  . Asthma   . COPD (chronic obstructive pulmonary disease)   . H/O hiatal hernia   . Shortness of breath   . Hypertension   . GERD (gastroesophageal reflux disease)   . Diabetes mellitus    Past Surgical History  Procedure Laterality Date  . No past surgeries    . Knee arthroscopy  03/24/2011    Procedure: ARTHROSCOPY KNEE;  Surgeon: Alta Corning, MD;  Location: [facility];  Service: Orthopedics;  Laterality: Right;  partial medial menisectomy, chondroplaty patella, and medial medial plica   History reviewed. No pertinent family history. History  Substance Use Topics  . Smoking status: Never Smoker   . Smokeless tobacco: Current User    Types: Chew  . Alcohol Use: Yes     Comment: social, 1x weekly    Review of Systems  Constitutional:  Negative for fever and chills.  Respiratory: Positive for cough. Negative for chest tightness and shortness of breath.   Cardiovascular: Positive for chest pain (dull ache for 2 days).  Gastrointestinal: Negative for vomiting and abdominal pain.  All other systems reviewed and are negative.     Allergies  Penicillins  Home Medications   Prior to Admission medications   Medication Sig Start Date End Date Taking? Authorizing Provider  albuterol (PROVENTIL HFA;VENTOLIN HFA) 108 (90 BASE) MCG/ACT inhaler Inhale 1-2 puffs into the lungs every 6 (six) hours as needed for wheezing or shortness of breath.    Yes Historical Provider, MD  aspirin EC 81 MG tablet Take 81 mg by mouth daily.   Yes Historical Provider, MD  atorvastatin (LIPITOR) 40 MG tablet Take 40 mg by mouth daily.   Yes Historical Provider, MD  esomeprazole (NEXIUM) 40 MG capsule Take 40 mg by mouth daily at 12 noon.   Yes Historical Provider, MD  fluticasone-salmeterol (ADVAIR HFA) 230-21 MCG/ACT inhaler Inhale 1 puff into the lungs 2 (two) times daily.    Yes Historical Provider, MD  glimepiride (AMARYL) 4 MG tablet Take 4 mg by mouth 2 (two) times daily.   Yes Historical Provider, MD  insulin glargine (LANTUS) 100 UNIT/ML injection Inject 28-30 Units into the skin daily. Per sliding scale.   Yes Historical Provider, MD  KOMBIGLYZE XR 2.06-998 MG TB24 Take 2 tablets by mouth daily. 02/25/13  Yes Historical Provider, MD  lisinopril-hydrochlorothiazide (PRINZIDE,ZESTORETIC) 20-25 MG per tablet  Take 1 tablet by mouth daily. 02/25/13  Yes Historical Provider, MD  Multiple Vitamin (MULTIVITAMIN WITH MINERALS) TABS tablet Take 1 tablet by mouth daily.   Yes Historical Provider, MD  levofloxacin (LEVAQUIN) 750 MG tablet Take 1 tablet (750 mg total) by mouth daily. X 7 days 05/14/14   Evelina Bucy, MD  predniSONE (DELTASONE) 20 MG tablet Take 3 tablets (60 mg total) by mouth daily. Patient not taking: Reported on 05/13/2014 11/27/13   Carlisle Cater, PA-C  predniSONE (DELTASONE) 20 MG tablet Take 2 tablets (40 mg total) by mouth once. 05/14/14   Evelina Bucy, MD   BP 134/86 mmHg  Pulse 90  Temp(Src) 97.9 F (36.6 C) (Oral)  Resp 18  SpO2 97% Physical Exam  Constitutional: He is oriented to person, place, and time. He appears well-developed and well-nourished. No distress.  HENT:  Head: Normocephalic and atraumatic.  Mouth/Throat: Oropharynx is clear and moist. No oropharyngeal exudate.  Eyes: EOM are normal. Pupils are equal, round, and reactive to light.  Neck: Normal range of motion. Neck supple.  Cardiovascular: Normal rate and regular rhythm.  Exam reveals no friction rub.   No murmur heard. Pulmonary/Chest: Effort normal and breath sounds normal. No respiratory distress. He has no wheezes. He has no rales.  Abdominal: Soft. He exhibits no distension. There is no tenderness. There is no rebound.  Musculoskeletal: Normal range of motion. He exhibits no edema.  Neurological: He is alert and oriented to person, place, and time. No cranial nerve deficit. He exhibits normal muscle tone.  Skin: No rash noted. He is not diaphoretic.  Nursing note and vitals reviewed.   ED Course  Procedures (including critical care time) Labs Review Labs Reviewed  COMPREHENSIVE METABOLIC PANEL - Abnormal; Notable for the following:    Glucose, Bld 302 (*)    BUN 25 (*)    Total Bilirubin 1.6 (*)    GFR calc non Af Amer 71 (*)    GFR calc Af Amer 82 (*)    All other components within normal limits  CBC WITH DIFFERENTIAL/PLATELET  D-DIMER, QUANTITATIVE  I-STAT TROPOININ, ED  TYPE AND SCREEN  ABO/RH    Imaging Review Dg Chest 2 View  05/13/2014   CLINICAL DATA:  Hemoptysis for 24 hours, similar symptoms with pneumonia in the past. History of asthma, COPD, diabetes, hypertension and hiatal hernia.  EXAM: CHEST  2 VIEW  COMPARISON:  Chest radiograph November 27, 2013  FINDINGS: Cardiomediastinal silhouette is unremarkable. Mildly  increased lung volumes with flattened hemidiaphragms can be seen with patient's known COPD. The lungs are clear without pleural effusions or focal consolidations. Trachea projects midline and there is no pneumothorax. Soft tissue planes and included osseous structures are non-suspicious.  IMPRESSION: COPD. Resolution of RIGHT lung base airspace opacity without acute cardiopulmonary process.   Electronically Signed   By: Elon Alas   On: 05/13/2014 23:00     EKG Interpretation None      MDM   Final diagnoses:  Bronchitis    54 year old male here with cough and blood-streaked mucus. History of COPD. Recent increase in mucus. After a coughing fit he had some blood streaking his mucus. No frank hemoptysis or blood clots. No fevers. Recent pneumonia about 6 months ago. No history of blood clots. Here vitals are stable. Lungs are clear. He does report some dull chest pain is nonreproducible. No respiratory distress on evaluation. CXR negative. Will check D-dimer to r/o PE. Doubt PE, however cannot PERC patient. Likely infectious  etiology. Dimer negative. CXR normal. Given levaquin to cover for bronchitis. Steroids prescribed also.  Evelina Bucy, MD 05/14/14 [phone]

## 2014-12-12 ENCOUNTER — Emergency Department (HOSPITAL_COMMUNITY): Payer: Managed Care, Other (non HMO)

## 2014-12-12 ENCOUNTER — Emergency Department (HOSPITAL_COMMUNITY)
Admission: EM | Admit: 2014-12-12 | Discharge: 2014-12-12 | Disposition: A | Discharge status: Home / Self Care | Payer: Managed Care, Other (non HMO) | Attending: Emergency Medicine | Admitting: Emergency Medicine

## 2014-12-12 ENCOUNTER — Encounter (HOSPITAL_COMMUNITY): Payer: Self-pay | Admitting: Emergency Medicine

## 2014-12-12 DIAGNOSIS — Z7982 Long term (current) use of aspirin: Secondary | ICD-10-CM | POA: Diagnosis not present

## 2014-12-12 DIAGNOSIS — K2970 Gastritis, unspecified, without bleeding: Secondary | ICD-10-CM | POA: Diagnosis not present

## 2014-12-12 DIAGNOSIS — J449 Chronic obstructive pulmonary disease, unspecified: Secondary | ICD-10-CM | POA: Insufficient documentation

## 2014-12-12 DIAGNOSIS — Z794 Long term (current) use of insulin: Secondary | ICD-10-CM | POA: Diagnosis not present

## 2014-12-12 DIAGNOSIS — I10 Essential (primary) hypertension: Secondary | ICD-10-CM | POA: Diagnosis not present

## 2014-12-12 DIAGNOSIS — K273 Acute peptic ulcer, site unspecified, without hemorrhage or perforation: Secondary | ICD-10-CM | POA: Insufficient documentation

## 2014-12-12 DIAGNOSIS — R112 Nausea with vomiting, unspecified: Secondary | ICD-10-CM

## 2014-12-12 DIAGNOSIS — R17 Unspecified jaundice: Secondary | ICD-10-CM | POA: Insufficient documentation

## 2014-12-12 DIAGNOSIS — Z79899 Other long term (current) drug therapy: Secondary | ICD-10-CM | POA: Diagnosis not present

## 2014-12-12 DIAGNOSIS — K5900 Constipation, unspecified: Secondary | ICD-10-CM | POA: Insufficient documentation

## 2014-12-12 DIAGNOSIS — R1013 Epigastric pain: Secondary | ICD-10-CM

## 2014-12-12 DIAGNOSIS — K279 Peptic ulcer, site unspecified, unspecified as acute or chronic, without hemorrhage or perforation: Secondary | ICD-10-CM

## 2014-12-12 DIAGNOSIS — Z880 Allergy status to penicillin: Secondary | ICD-10-CM | POA: Diagnosis not present

## 2014-12-12 DIAGNOSIS — D849 Immunodeficiency, unspecified: Secondary | ICD-10-CM | POA: Diagnosis not present

## 2014-12-12 DIAGNOSIS — E119 Type 2 diabetes mellitus without complications: Secondary | ICD-10-CM | POA: Diagnosis not present

## 2014-12-12 DIAGNOSIS — R000 Tachycardia, unspecified: Secondary | ICD-10-CM | POA: Diagnosis not present

## 2014-12-12 DIAGNOSIS — K219 Gastro-esophageal reflux disease without esophagitis: Secondary | ICD-10-CM | POA: Diagnosis not present

## 2014-12-12 DIAGNOSIS — R1012 Left upper quadrant pain: Secondary | ICD-10-CM | POA: Diagnosis present

## 2014-12-12 LAB — COMPREHENSIVE METABOLIC PANEL
ALBUMIN: 4.5 g/dL (ref 3.5–5.0)
ALT: 31 U/L (ref 17–63)
AST: 23 U/L (ref 15–41)
Alkaline Phosphatase: 74 U/L (ref 38–126)
Anion gap: 10 (ref 5–15)
BILIRUBIN TOTAL: 2 mg/dL — AB (ref 0.3–1.2)
BUN: 22 mg/dL — ABNORMAL HIGH (ref 6–20)
CHLORIDE: 99 mmol/L — AB (ref 101–111)
CO2: 32 mmol/L (ref 22–32)
CREATININE: 1.17 mg/dL (ref 0.61–1.24)
Calcium: 9.8 mg/dL (ref 8.9–10.3)
GFR calc Af Amer: >60 mL/min (ref >60)
GFR calc non Af Amer: >60 mL/min (ref >60)
GLUCOSE: 174 mg/dL — AB (ref 65–99)
POTASSIUM: 4.7 mmol/L (ref 3.5–5.1)
Sodium: 141 mmol/L (ref 135–145)
Total Protein: 7.5 g/dL (ref 6.5–8.1)

## 2014-12-12 LAB — URINE MICROSCOPIC-ADD ON

## 2014-12-12 LAB — LIPASE, BLOOD: LIPASE: 31 U/L (ref 11–51)

## 2014-12-12 LAB — CBC
HEMATOCRIT: 48.1 % (ref 39.0–52.0)
Hemoglobin: 17.3 g/dL — ABNORMAL HIGH (ref 13.0–17.0)
MCH: 31.9 pg (ref 26.0–34.0)
MCHC: 36 g/dL (ref 30.0–36.0)
MCV: 88.7 fL (ref 78.0–100.0)
PLATELETS: 197 10*3/uL (ref 150–400)
RBC: 5.42 MIL/uL (ref 4.22–5.81)
RDW: 13.6 % (ref 11.5–15.5)
WBC: 12.8 10*3/uL — ABNORMAL HIGH (ref 4.0–10.5)

## 2014-12-12 LAB — URINALYSIS, ROUTINE W REFLEX MICROSCOPIC
Bilirubin Urine: NEGATIVE
GLUCOSE, UA: 100 mg/dL — AB
Hgb urine dipstick: NEGATIVE
Ketones, ur: NEGATIVE mg/dL
Nitrite: NEGATIVE
Protein, ur: NEGATIVE mg/dL
SPECIFIC GRAVITY, URINE: 1.025 (ref 1.005–1.030)
Urobilinogen, UA: 0.2 mg/dL (ref 0.0–1.0)
pH: 5.5 (ref 5.0–8.0)

## 2014-12-12 MED ORDER — POLYETHYLENE GLYCOL 3350 17 G PO PACK: 17.0000 g | PACK | Freq: Every day | ORAL | Status: DC

## 2014-12-12 MED ORDER — PANTOPRAZOLE SODIUM 40 MG IV SOLR
40.0000 mg | Freq: Once | INTRAVENOUS | Status: AC
  Administered 2014-12-12: 40 mg via INTRAVENOUS
  Filled 2014-12-12: qty 40

## 2014-12-12 MED ORDER — ONDANSETRON HCL 8 MG PO TABS: 8.0000 mg | ORAL_TABLET | Freq: Three times a day (TID) | ORAL | Status: DC | PRN

## 2014-12-12 MED ORDER — SODIUM CHLORIDE 0.9 % IV BOLUS (SEPSIS)
1000.0000 mL | Freq: Once | INTRAVENOUS | Status: AC
  Administered 2014-12-12: 1000 mL via INTRAVENOUS

## 2014-12-12 MED ORDER — GI COCKTAIL ~~LOC~~
30.0000 mL | Freq: Once | ORAL | Status: AC
  Administered 2014-12-12: 30 mL via ORAL
  Filled 2014-12-12: qty 30

## 2014-12-12 MED ORDER — ONDANSETRON HCL 4 MG/2ML IJ SOLN
4.0000 mg | Freq: Once | INTRAMUSCULAR | Status: AC
  Administered 2014-12-12: 4 mg via INTRAVENOUS
  Filled 2014-12-12: qty 2

## 2014-12-12 MED ORDER — MORPHINE SULFATE (PF) 4 MG/ML IV SOLN
4.0000 mg | Freq: Once | INTRAVENOUS | Status: AC
  Administered 2014-12-12: 4 mg via INTRAVENOUS
  Filled 2014-12-12: qty 1

## 2014-12-12 MED ORDER — HYDROCODONE-ACETAMINOPHEN 5-325 MG PO TABS: 1.0000 | ORAL_TABLET | Freq: Four times a day (QID) | ORAL | Status: DC | PRN

## 2014-12-12 MED ORDER — RANITIDINE HCL 150 MG PO TABS: 150.0000 mg | ORAL_TABLET | Freq: Two times a day (BID) | ORAL | Status: DC

## 2014-12-12 NOTE — ED Notes (Signed)
Pt tolerated PO challenge, denies nausea, no episode of emesis.

## 2014-12-12 NOTE — ED Notes (Signed)
Pt offered a diet ginger ale. Will reassess for PO challenge outcome.

## 2014-12-12 NOTE — ED Notes (Signed)
Per pt, states abdominal pain and nausea on and off for about a month-a lot of belching-occurs after eating

## 2014-12-12 NOTE — ED Provider Notes (Signed)
CSN: [id number]     Arrival date & time 12/12/14  1812 History   First MD Initiated Contact with Patient 12/12/14 1907     Chief Complaint  Patient presents with  . Abdominal Pain     (Consider location/radiation/quality/duration/timing/severity/associated sxs/prior Treatment) HPI Comments: Juan [name] is a 54 y.o. male with a PMHx of asthma, COPD, hiatal hernia, HTN, GERD, PUD, and DM2, who presents to the ED with complaints of one month of gradual onset epigastric abdominal pain. He discussed the pain is 7/10 intermittent sharp epigastric pain which is nonradiating, worse with eating, with no treatment tried prior to arrival. Patient reports associated nausea, vomiting with 8 episodes in the last 24 hours of nonbloody nonbilious emesis, belching, and bloating without distention. He admits to drinking approximately 10-12 beers 1-2 times per week.  He denies any fevers, chills, chest pain, shortness breath, obstipation, diarrhea, constipation, melena, hematochezia, hematemesis, dysuria, hematuria, flank pain, numbness, tingling, weakness, recent travel, sick contacts, suspicious food intake, prior abdominal surgeries, antibiotic use, or chronic NSAID use. He states his last EGD was 50yrs ago.  Patient is a 54 y.o. male presenting with abdominal pain. The history is provided by the patient. No language interpreter was used.  Abdominal Pain Pain location:  Epigastric and LUQ Pain quality: sharp   Pain radiates to:  Does not radiate Pain severity:  Moderate Onset quality:  Gradual Duration:  4 weeks Timing:  Intermittent Progression:  Waxing and waning Chronicity:  New Context: eating   Context: not recent illness, not recent travel, not sick contacts and not suspicious food intake   Relieved by:  None tried Worsened by:  Eating Ineffective treatments:  None tried Associated symptoms: nausea and vomiting   Associated symptoms: no chest pain, no chills, no constipation, no diarrhea,  no dysuria, no fever, no flatus, no hematemesis, no hematochezia, no hematuria, no melena and no shortness of breath   Risk factors: alcohol abuse   Risk factors: has not had multiple surgeries and no NSAID use     Past Medical History  Diagnosis Date  . Asthma   . COPD (chronic obstructive pulmonary disease) ([place] )   . H/O hiatal hernia   . Shortness of breath   . Hypertension   . GERD (gastroesophageal reflux disease)   . Diabetes mellitus    Past Surgical History  Procedure Laterality Date  . No past surgeries    . Knee arthroscopy  03/24/2011    Procedure: ARTHROSCOPY KNEE;  Surgeon: Alta Corning, MD;  Location: [facility];  Service: Orthopedics;  Laterality: Right;  partial medial menisectomy, chondroplaty patella, and medial medial plica   No family history on file. Social History  Substance Use Topics  . Smoking status: Never Smoker   . Smokeless tobacco: Current User    Types: Chew  . Alcohol Use: Yes     Comment: social, 1x weekly    Review of Systems  Constitutional: Negative for fever and chills.  Respiratory: Negative for shortness of breath.   Cardiovascular: Negative for chest pain.  Gastrointestinal: Positive for nausea, vomiting and abdominal pain. Negative for diarrhea, constipation, blood in stool, melena, hematochezia, abdominal distention, flatus and hematemesis.       +belching +bloating  Genitourinary: Negative for dysuria, hematuria and flank pain.  Musculoskeletal: Negative for myalgias and arthralgias.  Skin: Negative for color change.  Allergic/Immunologic: Positive for immunocompromised state (diabetic).  Neurological: Negative for weakness and numbness.  Psychiatric/Behavioral: Negative for confusion.   10  Systems reviewed and are negative for acute change except as noted in the HPI.    Allergies  Penicillins  Home Medications   Prior to Admission medications   Medication Sig Start Date End Date Taking? Authorizing  Provider  albuterol (PROVENTIL HFA;VENTOLIN HFA) 108 (90 BASE) MCG/ACT inhaler Inhale 1-2 puffs into the lungs every 6 (six) hours as needed for wheezing or shortness of breath.     Historical Provider, MD  aspirin EC 81 MG tablet Take 81 mg by mouth daily.    Historical Provider, MD  atorvastatin (LIPITOR) 40 MG tablet Take 40 mg by mouth daily.    Historical Provider, MD  esomeprazole (NEXIUM) 40 MG capsule Take 40 mg by mouth daily at 12 noon.    Historical Provider, MD  fluticasone-salmeterol (ADVAIR HFA) 230-21 MCG/ACT inhaler Inhale 1 puff into the lungs 2 (two) times daily.     Historical Provider, MD  glimepiride (AMARYL) 4 MG tablet Take 4 mg by mouth 2 (two) times daily.    Historical Provider, MD  insulin glargine (LANTUS) 100 UNIT/ML injection Inject 28-30 Units into the skin daily. Per sliding scale.    Historical Provider, MD  KOMBIGLYZE XR 2.06-998 MG TB24 Take 2 tablets by mouth daily. 02/25/13   Historical Provider, MD  levofloxacin (LEVAQUIN) 750 MG tablet Take 1 tablet (750 mg total) by mouth daily. X 7 days 05/14/14   Evelina Bucy, MD  lisinopril-hydrochlorothiazide (PRINZIDE,ZESTORETIC) 20-25 MG per tablet Take 1 tablet by mouth daily. 02/25/13   Historical Provider, MD  Multiple Vitamin (MULTIVITAMIN WITH MINERALS) TABS tablet Take 1 tablet by mouth daily.    Historical Provider, MD  predniSONE (DELTASONE) 20 MG tablet Take 3 tablets (60 mg total) by mouth daily. Patient not taking: Reported on 05/13/2014 11/27/13   Carlisle Cater, PA-C  predniSONE (DELTASONE) 20 MG tablet Take 2 tablets (40 mg total) by mouth once. 05/14/14   Evelina Bucy, MD   BP 128/88 mmHg  Pulse 118  Temp(Src) 98 F (36.7 C) (Oral)  Resp 18  SpO2 99% Physical Exam  Constitutional: He is oriented to person, place, and time. Vital signs are normal. He appears well-developed and well-nourished.  Non-toxic appearance. No distress.  Afebrile, nontoxic, NAD  HENT:  Head: Normocephalic and atraumatic.    Mouth/Throat: Mucous membranes are dry.  Dry mucous membranes  Eyes: Conjunctivae and EOM are normal. Right eye exhibits no discharge. Left eye exhibits no discharge.  Neck: Normal range of motion. Neck supple.  Cardiovascular: Regular rhythm, normal heart sounds and intact distal pulses.  Tachycardia present.  Exam reveals no gallop and no friction rub.   No murmur heard. Mildly tachycardic likely 2/2 dehydration  Pulmonary/Chest: Effort normal and breath sounds normal. No respiratory distress. He has no decreased breath sounds. He has no wheezes. He has no rhonchi. He has no rales.  Abdominal: Soft. Normal appearance and bowel sounds are normal. He exhibits no distension. There is tenderness in the epigastric area and left upper quadrant. There is no rigidity, no rebound, no guarding, no CVA tenderness, no tenderness at McBurney's point and negative Murphy's sign.    Soft, no obvious distension but pt with prominent abdomen likely 2/2 to alcohol use, +BS throughout, with epigastric and LUQ TTP, no r/g/r, neg murphy's, neg mcburney's, no CVA TTP   Musculoskeletal: Normal range of motion.  Neurological: He is alert and oriented to person, place, and time. He has normal strength. No sensory deficit.  Skin: Skin is warm, dry and intact. No  rash noted.  Psychiatric: He has a normal mood and affect.  Nursing note and vitals reviewed.   ED Course  Procedures (including critical care time) Labs Review Labs Reviewed  COMPREHENSIVE METABOLIC PANEL - Abnormal; Notable for the following:    Chloride 99 (*)    Glucose, Bld 174 (*)    BUN 22 (*)    Total Bilirubin 2.0 (*)    All other components within normal limits  CBC - Abnormal; Notable for the following:    WBC 12.8 (*)    Hemoglobin 17.3 (*)    All other components within normal limits  URINALYSIS, ROUTINE W REFLEX MICROSCOPIC (NOT AT [facility]) - Abnormal; Notable for the following:    APPearance CLOUDY (*)    Glucose, UA 100 (*)     Leukocytes, UA SMALL (*)    All other components within normal limits  LIPASE, BLOOD  URINE MICROSCOPIC-ADD ON    Imaging Review Dg Abd Acute W/chest  12/12/2014  CLINICAL DATA:  Lower centralized abdominal pain and nausea for the past month. EXAM: DG ABDOMEN ACUTE W/ 1V CHEST COMPARISON:  Chest radiograph - 05/13/2014; 02/28/2013; chest CT-11/24/2018 FINDINGS: Grossly unchanged cardiac silhouette and mediastinal contours. No focal parenchymal opacities. No pleural effusion or pneumothorax. No evidence of edema. Paucity of bowel gas without evidence of obstruction. Moderate colonic stool burden. No pneumoperitoneum, pneumatosis or portal venous gas. Punctate calcification overlying the left lower abdominal quadrant is favored to represent these splenic calcifications seen on prior chest CT performed 11/2013. Otherwise, no definite abnormal intra-abdominal calcifications. No acute osseus abnormalities. Note is made of a prominent right-sided os acetabuli. IMPRESSION: 1.  No acute cardiopulmonary disease. 2. Moderate colonic stool burden without evidence of enteric obstruction. Electronically Signed   By: Sandi Mariscal M.D.   On: 12/12/2014 19:48   I have personally reviewed and evaluated these images and lab results as part of my medical decision-making.   EKG Interpretation None      MDM   Final diagnoses:  Epigastric abdominal pain  Non-intractable vomiting with nausea, vomiting of unspecified type  Gastritis  Gastroesophageal reflux disease, esophagitis presence not specified  PUD (peptic ulcer disease)  Hyperbilirubinemia  Constipation, unspecified constipation type    54 y.o. male here with intermittent epigastric abdominal pain and nausea 1 month. History of gastritis and GERD. Positive belching and bloating. Chronic alcohol use endorse. On exam patient in in the epigastrium and left upper quadrant, no obvious distention although patient does have a prominent abdomen likely due to  chronic alcoholism. No peritoneal signs, positive bowel sounds throughout, neg murphy's and mcburney's. Patient appears dehydrated, mildly tachycardic. Will obtain labs, give fluids, Zofran, GI cocktail, and pain medication. Will obtain acute abdominal series to evaluate for obstruction. Will reassess shortly.   9:53 PM U/A without evidence of infection. Lipase WNL. CMP with mildly elevated gluc without anion gap, and elevated bili similar to prior evaluations. CBC with mildly elevated WBC but also elevated H/H likely secondary to dehydration. Acute abd series reveals moderate colonic stool burden without obstruction. Pt's symptoms improved, will PO challenge. Will recheck VS as well to ensure tachycardia has resolved. Will reassess shortly.   10:53 PM Tachycardia improving. Tolerating PO well. Will d/c home with zantac in addition to home nexium, with zofran and few tabs of pain meds but discussed that tylenol for pain would be ideal in order to avoid further constipation. Miralax given. F/up with his PCP in 1wk and his GI dr in 1-2wks. I explained  the diagnosis and have given explicit precautions to return to the ER including for any other new or worsening symptoms. The patient understands and accepts the medical plan as it's been dictated and I have answered their questions. Discharge instructions concerning home care and prescriptions have been given. The patient is STABLE and is discharged to home in good condition.  BP 130/91 mmHg  Pulse 108  Temp(Src) 98.6 F (37 C) (Oral)  Resp 14  SpO2 100%  Meds ordered this encounter  Medications  . sodium chloride 0.9 % bolus 1,000 mL    Sig:   . pantoprazole (PROTONIX) injection 40 mg    Sig:   . ondansetron (ZOFRAN) injection 4 mg    Sig:   . morphine 4 MG/ML injection 4 mg    Sig:   . gi cocktail (Maalox,Lidocaine,Donnatal)    Sig:   . ranitidine (ZANTAC) 150 MG tablet    Sig: Take 1 tablet (150 mg total) by mouth 2 (two) times daily.     Dispense:  30 tablet    Refill:  0    Order Specific Question:  Supervising Provider    Answer:  MILLER, BRIAN [[id number]]  . ondansetron (ZOFRAN) 8 MG tablet    Sig: Take 1 tablet (8 mg total) by mouth every 8 (eight) hours as needed for nausea or vomiting.    Dispense:  10 tablet    Refill:  0    Order Specific Question:  Supervising Provider    Answer:  Sabra Heck, BRIAN [[id number]]  . HYDROcodone-acetaminophen (NORCO) 5-325 MG tablet    Sig: Take 1 tablet by mouth every 6 (six) hours as needed for severe pain.    Dispense:  6 tablet    Refill:  0    Order Specific Question:  Supervising Provider    Answer:  MILLER, BRIAN [[id number]]  . polyethylene glycol (MIRALAX / GLYCOLAX) packet    Sig: Take 17 g by mouth daily.    Dispense:  14 each    Refill:  0    Order Specific Question:  Supervising Provider    Answer:  Noemi Chapel [[id number]]     Roderick Calo Camprubi-Soms, PA-C 12/12/14 [id number]  Varney Biles, MD 12/13/14 MR:[id number]

## 2014-12-12 NOTE — Discharge Instructions (Signed)
Your abdominal pain is likely from gastritis or an ulcer. You will need to take your home nexium daily and start taking zantac as directed, and avoid spicy/fatty/acidic foods. Avoid laying down flat within 30 minutes of eating. Avoid NSAIDs like ibuprofen on an empty stomach. Use zofran as needed for nausea. Use norco as needed for pain but don't drive or operate machinery while taking this medication. Follow up with your gastroenterologist in 1-2 weeks for ongoing evaluation of your abdominal pain. Follow up with your regular doctor in one week for recheck. Start taking miralax as directed to help with your bowel movements. Return to the ER for changes or worsening symptoms.  Abdominal (belly) pain can be caused by many things. Your caregiver performed an examination and possibly ordered blood/urine tests and imaging (CT scan, x-rays, ultrasound). Many cases can be observed and treated at home after initial evaluation in the emergency department. Even though you are being discharged home, abdominal pain can be unpredictable. Therefore, you need a repeated exam if your pain does not resolve, returns, or worsens. Most patients with abdominal pain don't have to be admitted to the hospital or have surgery, but serious problems like appendicitis and gallbladder attacks can start out as nonspecific pain. Many abdominal conditions cannot be diagnosed in one visit, so follow-up evaluations are very important. SEEK IMMEDIATE MEDICAL ATTENTION IF YOU DEVELOP ANY OF THE FOLLOWING SYMPTOMS:  The pain does not go away or becomes severe.   A temperature above 101 develops.   Repeated vomiting occurs (multiple episodes).   The pain becomes localized to portions of the abdomen. The right side could possibly be appendicitis. In an adult, the left lower portion of the abdomen could be colitis or diverticulitis.   Blood is being passed in stools or vomit (bright red or black tarry stools).   Return also if you develop  chest pain, difficulty breathing, dizziness or fainting, or become confused, poorly responsive, or inconsolable (young children).  The constipation stays for more than 4 days.   There is belly (abdominal) or rectal pain.   You do not seem to be getting better.      Abdominal Pain, Adult Many things can cause belly (abdominal) pain. Most times, the belly pain is not dangerous. Many cases of belly pain can be watched and treated at home. HOME CARE   Do not take medicines that help you go poop (laxatives) unless told to by your doctor.  Only take medicine as told by your doctor.  Eat or drink as told by your doctor. Your doctor will tell you if you should be on a special diet. GET HELP IF:  You do not know what is causing your belly pain.  You have belly pain while you are sick to your stomach (nauseous) or have runny poop (diarrhea).  You have pain while you pee or poop.  Your belly pain wakes you up at night.  You have belly pain that gets worse or better when you eat.  You have belly pain that gets worse when you eat fatty foods.  You have a fever. GET HELP RIGHT AWAY IF:   The pain does not go away within 2 hours.  You keep throwing up (vomiting).  The pain changes and is only in the right or left part of the belly.  You have bloody or tarry looking poop. MAKE SURE YOU:   Understand these instructions.  Will watch your condition.  Will get help right away if you are  not doing well or get worse.   This information is not intended to replace advice given to you by your health care provider. Make sure you discuss any questions you have with your health care provider.   Document Released: 07/07/2007 Document Revised: 02/08/2014 Document Reviewed: 09/27/2012 Elsevier Interactive Patient Education 2016 Elsevier Inc.  Gastritis, Adult Gastritis is soreness and swelling (inflammation) of the lining of the stomach. Gastritis can develop as a sudden onset (acute) or  long-term (chronic) condition. If gastritis is not treated, it can lead to stomach bleeding and ulcers. CAUSES  Gastritis occurs when the stomach lining is weak or damaged. Digestive juices from the stomach then inflame the weakened stomach lining. The stomach lining may be weak or damaged due to viral or bacterial infections. One common bacterial infection is the Helicobacter pylori infection. Gastritis can also result from excessive alcohol consumption, taking certain medicines, or having too much acid in the stomach.  SYMPTOMS  In some cases, there are no symptoms. When symptoms are present, they may include:  Pain or a burning sensation in the upper abdomen.  Nausea.  Vomiting.  An uncomfortable feeling of fullness after eating. DIAGNOSIS  Your caregiver may suspect you have gastritis based on your symptoms and a physical exam. To determine the cause of your gastritis, your caregiver may perform the following:  Blood or stool tests to check for the H pylori bacterium.  Gastroscopy. A thin, flexible tube (endoscope) is passed down the esophagus and into the stomach. The endoscope has a light and camera on the end. Your caregiver uses the endoscope to view the inside of the stomach.  Taking a tissue sample (biopsy) from the stomach to examine under a microscope. TREATMENT  Depending on the cause of your gastritis, medicines may be prescribed. If you have a bacterial infection, such as an H pylori infection, antibiotics may be given. If your gastritis is caused by too much acid in the stomach, H2 blockers or antacids may be given. Your caregiver may recommend that you stop taking aspirin, ibuprofen, or other nonsteroidal anti-inflammatory drugs (NSAIDs). HOME CARE INSTRUCTIONS  Only take over-the-counter or prescription medicines as directed by your caregiver.  If you were given antibiotic medicines, take them as directed. Finish them even if you start to feel better.  Drink enough  fluids to keep your urine clear or pale yellow.  Avoid foods and drinks that make your symptoms worse, such as:  Caffeine or alcoholic drinks.  Chocolate.  Peppermint or mint flavorings.  Garlic and onions.  Spicy foods.  Citrus fruits, such as oranges, lemons, or limes.  Tomato-based foods such as sauce, chili, salsa, and pizza.  Fried and fatty foods.  Eat small, frequent meals instead of large meals. SEEK IMMEDIATE MEDICAL CARE IF:   You have black or dark red stools.  You vomit blood or material that looks like coffee grounds.  You are unable to keep fluids down.  Your abdominal pain gets worse.  You have a fever.  You do not feel better after 1 week.  You have any other questions or concerns. MAKE SURE YOU:  Understand these instructions.  Will watch your condition.  Will get help right away if you are not doing well or get worse.   This information is not intended to replace advice given to you by your health care provider. Make sure you discuss any questions you have with your health care provider.   Document Released: 01/12/2001 Document Revised: 07/20/2011 Document Reviewed: 03/03/2011  Chartered certified accountant Patient Education 2016 [place] for Gastroesophageal Reflux Disease, Adult When you have gastroesophageal reflux disease (GERD), the foods you eat and your eating habits are very important. Choosing the right foods can help ease your discomfort.  WHAT GUIDELINES DO I NEED TO FOLLOW?   Choose fruits, vegetables, whole grains, and low-fat dairy products.   Choose low-fat meat, fish, and poultry.  Limit fats such as oils, salad dressings, butter, nuts, and avocado.   Keep a food diary. This helps you identify foods that cause symptoms.   Avoid foods that cause symptoms. These may be different for everyone.   Eat small meals often instead of 3 large meals a day.   Eat your meals slowly, in a place where you are relaxed.    Limit fried foods.   Cook foods using methods other than frying.   Avoid drinking alcohol.   Avoid drinking large amounts of liquids with your meals.   Avoid bending over or lying down until 2-3 hours after eating.  WHAT FOODS ARE NOT RECOMMENDED?  These are some foods and drinks that may make your symptoms worse: Vegetables Tomatoes. Tomato juice. Tomato and spaghetti sauce. Chili peppers. Onion and garlic. Horseradish. Fruits Oranges, grapefruit, and lemon (fruit and juice). Meats High-fat meats, fish, and poultry. This includes hot dogs, ribs, ham, sausage, salami, and bacon. Dairy Whole milk and chocolate milk. Sour cream. Cream. Butter. Ice cream. Cream cheese.  Drinks Coffee and tea. Bubbly (carbonated) drinks or energy drinks. Condiments Hot sauce. Barbecue sauce.  Sweets/Desserts Chocolate and cocoa. Donuts. Peppermint and spearmint. Fats and Oils High-fat foods. This includes [place] fries and potato chips. Other Vinegar. Strong spices. This includes black pepper, white pepper, red pepper, cayenne, curry powder, cloves, ginger, and chili powder. The items listed above may not be a complete list of foods and drinks to avoid. Contact your dietitian for more information.   This information is not intended to replace advice given to you by your health care provider. Make sure you discuss any questions you have with your health care provider.   Document Released: 07/20/2011 Document Revised: 02/08/2014 Document Reviewed: 11/22/2012 Elsevier Interactive Patient Education 2016 Elsevier Inc.  High-Fiber Diet Fiber, also called dietary fiber, is a type of carbohydrate found in fruits, vegetables, whole grains, and beans. A high-fiber diet can have many health benefits. Your health care provider may recommend a high-fiber diet to help:  Prevent constipation. Fiber can make your bowel movements more regular.  Lower your cholesterol.  Relieve hemorrhoids,  uncomplicated diverticulosis, or irritable bowel syndrome.  Prevent overeating as part of a weight-loss plan.  Prevent heart disease, type 2 diabetes, and certain cancers. WHAT IS MY PLAN? The recommended daily intake of fiber includes:  38 grams for men under age 71.  19 grams for men over age 39.  58 grams for women under age 29.  47 grams for women over age 44. You can get the recommended daily intake of dietary fiber by eating a variety of fruits, vegetables, grains, and beans. Your health care provider may also recommend a fiber supplement if it is not possible to get enough fiber through your diet. WHAT DO I NEED TO KNOW ABOUT A HIGH-FIBER DIET?  Fiber supplements have not been widely studied for their effectiveness, so it is better to get fiber through food sources.  Always check the fiber content on thenutrition facts label of any prepackaged food. Look for foods that contain at least 5 grams  of fiber per serving.  Ask your dietitian if you have questions about specific foods that are related to your condition, especially if those foods are not listed in the following section.  Increase your daily fiber consumption gradually. Increasing your intake of dietary fiber too quickly may cause bloating, cramping, or gas.  Drink plenty of water. Water helps you to digest fiber. WHAT FOODS CAN I EAT? Grains Whole-grain breads. Multigrain cereal. Oats and oatmeal. Brown rice. Barley. Bulgur wheat. [place]. Bran muffins. Popcorn. Rye wafer crackers. Vegetables Sweet potatoes. Spinach. Kale. Artichokes. Cabbage. Broccoli. Green peas. Carrots. Squash. Fruits Berries. Pears. Apples. Oranges. Avocados. Prunes and raisins. Dried figs. Meats and Other Protein Sources [place], kidney, pinto, and soy beans. Split peas. Lentils. Nuts and seeds. Dairy Fiber-fortified yogurt. Beverages Fiber-fortified soy milk. Fiber-fortified orange juice. Other Fiber bars. The items listed above may not be  a complete list of recommended foods or beverages. Contact your dietitian for more options. WHAT FOODS ARE NOT RECOMMENDED? Grains White bread. Pasta made with refined flour. White rice. Vegetables Fried potatoes. Canned vegetables. Well-cooked vegetables.  Fruits Fruit juice. Cooked, strained fruit. Meats and Other Protein Sources Fatty cuts of meat. Fried Sales executive or fried fish. Dairy Milk. Yogurt. Cream cheese. Sour cream. Beverages Soft drinks. Other Cakes and pastries. Butter and oils. The items listed above may not be a complete list of foods and beverages to avoid. Contact your dietitian for more information. WHAT ARE SOME TIPS FOR INCLUDING HIGH-FIBER FOODS IN MY DIET?  Eat a wide variety of high-fiber foods.  Make sure that half of all grains consumed each day are whole grains.  Replace breads and cereals made from refined flour or white flour with whole-grain breads and cereals.  Replace white rice with brown rice, bulgur wheat, or millet.  Start the day with a breakfast that is high in fiber, such as a cereal that contains at least 5 grams of fiber per serving.  Use beans in place of meat in soups, salads, or pasta.  Eat high-fiber snacks, such as berries, raw vegetables, nuts, or popcorn.   This information is not intended to replace advice given to you by your health care provider. Make sure you discuss any questions you have with your health care provider.   Document Released: 01/18/2005 Document Revised: 02/08/2014 Document Reviewed: 07/03/2013 Elsevier Interactive Patient Education 2016 Elsevier Inc.  Nausea and Vomiting Nausea means you feel sick to your stomach. Throwing up (vomiting) is a reflex where stomach contents come out of your mouth. HOME CARE   Take medicine as told by your doctor.  Do not force yourself to eat. However, you do need to drink fluids.  If you feel like eating, eat a normal diet as told by your doctor.  Eat rice, wheat,  potatoes, bread, lean meats, yogurt, fruits, and vegetables.  Avoid high-fat foods.  Drink enough fluids to keep your pee (urine) clear or pale yellow.  Ask your doctor how to replace body fluid losses (rehydrate). Signs of body fluid loss (dehydration) include:  Feeling very thirsty.  Dry lips and mouth.  Feeling dizzy.  Dark pee.  Peeing less than normal.  Feeling confused.  Fast breathing or heart rate. GET HELP RIGHT AWAY IF:   You have blood in your throw up.  You have black or bloody poop (stool).  You have a bad headache or stiff neck.  You feel confused.  You have bad belly (abdominal) pain.  You have chest pain or trouble breathing.  You do not pee at least once every 8 hours.  You have cold, clammy skin.  You keep throwing up after 24 to 48 hours.  You have a fever. MAKE SURE YOU:   Understand these instructions.  Will watch your condition.  Will get help right away if you are not doing well or get worse.   This information is not intended to replace advice given to you by your health care provider. Make sure you discuss any questions you have with your health care provider.   Document Released: 07/07/2007 Document Revised: 04/12/2011 Document Reviewed: 06/19/2010 Elsevier Interactive Patient Education [employer].

## 2015-01-01 ENCOUNTER — Ambulatory Visit: Payer: Self-pay | Admitting: General Surgery

## 2015-01-12 ENCOUNTER — Encounter (HOSPITAL_COMMUNITY): Payer: Self-pay | Admitting: Emergency Medicine

## 2015-01-12 ENCOUNTER — Emergency Department (HOSPITAL_COMMUNITY)
Admission: EM | Admit: 2015-01-12 | Discharge: 2015-01-12 | Disposition: A | Discharge status: Home / Self Care | Payer: Managed Care, Other (non HMO) | Attending: Emergency Medicine | Admitting: Emergency Medicine

## 2015-01-12 DIAGNOSIS — I10 Essential (primary) hypertension: Secondary | ICD-10-CM | POA: Insufficient documentation

## 2015-01-12 DIAGNOSIS — Z7984 Long term (current) use of oral hypoglycemic drugs: Secondary | ICD-10-CM | POA: Diagnosis not present

## 2015-01-12 DIAGNOSIS — R1013 Epigastric pain: Secondary | ICD-10-CM

## 2015-01-12 DIAGNOSIS — J449 Chronic obstructive pulmonary disease, unspecified: Secondary | ICD-10-CM | POA: Diagnosis not present

## 2015-01-12 DIAGNOSIS — Z79899 Other long term (current) drug therapy: Secondary | ICD-10-CM | POA: Insufficient documentation

## 2015-01-12 DIAGNOSIS — Z7951 Long term (current) use of inhaled steroids: Secondary | ICD-10-CM | POA: Diagnosis not present

## 2015-01-12 DIAGNOSIS — Z880 Allergy status to penicillin: Secondary | ICD-10-CM | POA: Insufficient documentation

## 2015-01-12 DIAGNOSIS — Z7982 Long term (current) use of aspirin: Secondary | ICD-10-CM | POA: Insufficient documentation

## 2015-01-12 DIAGNOSIS — Z794 Long term (current) use of insulin: Secondary | ICD-10-CM | POA: Diagnosis not present

## 2015-01-12 DIAGNOSIS — K219 Gastro-esophageal reflux disease without esophagitis: Secondary | ICD-10-CM | POA: Insufficient documentation

## 2015-01-12 DIAGNOSIS — E119 Type 2 diabetes mellitus without complications: Secondary | ICD-10-CM | POA: Insufficient documentation

## 2015-01-12 LAB — COMPREHENSIVE METABOLIC PANEL
ALT: 34 U/L (ref 17–63)
AST: 23 U/L (ref 15–41)
Albumin: 3.9 g/dL (ref 3.5–5.0)
Alkaline Phosphatase: 57 U/L (ref 38–126)
Anion gap: 7 (ref 5–15)
BUN: 17 mg/dL (ref 6–20)
CO2: 31 mmol/L (ref 22–32)
Calcium: 9.3 mg/dL (ref 8.9–10.3)
Chloride: 97 mmol/L — ABNORMAL LOW (ref 101–111)
Creatinine, Ser: 1.23 mg/dL (ref 0.61–1.24)
GFR calc Af Amer: >60 mL/min (ref >60)
GFR calc non Af Amer: >60 mL/min (ref >60)
Glucose, Bld: 321 mg/dL — ABNORMAL HIGH (ref 65–99)
Potassium: 4.5 mmol/L (ref 3.5–5.1)
Sodium: 135 mmol/L (ref 135–145)
Total Bilirubin: 1.4 mg/dL — ABNORMAL HIGH (ref 0.3–1.2)
Total Protein: 6.5 g/dL (ref 6.5–8.1)

## 2015-01-12 LAB — LIPASE, BLOOD: Lipase: 38 U/L (ref 11–51)

## 2015-01-12 LAB — CBC
HCT: 39.7 % (ref 39.0–52.0)
Hemoglobin: 13.8 g/dL (ref 13.0–17.0)
MCH: 30.9 pg (ref 26.0–34.0)
MCHC: 34.8 g/dL (ref 30.0–36.0)
MCV: 89 fL (ref 78.0–100.0)
Platelets: 153 10*3/uL (ref 150–400)
RBC: 4.46 MIL/uL (ref 4.22–5.81)
RDW: 13.3 % (ref 11.5–15.5)
WBC: 6.1 10*3/uL (ref 4.0–10.5)

## 2015-01-12 MED ORDER — FAMOTIDINE IN NACL 20-0.9 MG/50ML-% IV SOLN
20.0000 mg | Freq: Once | INTRAVENOUS | Status: AC
  Administered 2015-01-12: 20 mg via INTRAVENOUS
  Filled 2015-01-12: qty 50

## 2015-01-12 MED ORDER — SODIUM CHLORIDE 0.9 % IV BOLUS (SEPSIS)
1000.0000 mL | Freq: Once | INTRAVENOUS | Status: AC
  Administered 2015-01-12: 1000 mL via INTRAVENOUS

## 2015-01-12 MED ORDER — HYDROMORPHONE HCL 1 MG/ML IJ SOLN
1.0000 mg | Freq: Once | INTRAMUSCULAR | Status: DC
  Filled 2015-01-12: qty 1

## 2015-01-12 MED ORDER — ONDANSETRON HCL 4 MG/2ML IJ SOLN
4.0000 mg | Freq: Once | INTRAMUSCULAR | Status: AC
  Administered 2015-01-12: 4 mg via INTRAVENOUS
  Filled 2015-01-12: qty 2

## 2015-01-12 MED ORDER — OXYCODONE-ACETAMINOPHEN 5-325 MG PO TABS: 1.0000 | ORAL_TABLET | ORAL | Status: DC | PRN

## 2015-01-12 MED ORDER — ONDANSETRON HCL 4 MG PO TABS: 4.0000 mg | ORAL_TABLET | Freq: Four times a day (QID) | ORAL | Status: DC

## 2015-01-12 NOTE — Discharge Instructions (Signed)

## 2015-01-12 NOTE — ED Provider Notes (Signed)
CSN: [id number]     Arrival date & time 01/12/15  1658 History   First MD Initiated Contact with Patient 01/12/15 1709     Chief Complaint  Patient presents with  . Abdominal Pain     (Consider location/radiation/quality/duration/timing/severity/associated sxs/prior Treatment) HPI  53yM with abdominal pain. Going on for months. Reports similar to symptoms previously attributed to cholelithiasis. Pain is in epigastrium. Scheduled for lap chole 12/22. Reports symptoms worsening again since yesterday. Nausea. Vomited once yesterday. No fever or chills. No respiratory complaints. Has used etoh heavily previously but denies in past several weeks.   Past Medical History  Diagnosis Date  . Asthma   . COPD (chronic obstructive pulmonary disease) ([place])   . H/O hiatal hernia   . Shortness of breath   . Hypertension   . GERD (gastroesophageal reflux disease)   . Diabetes mellitus    Past Surgical History  Procedure Laterality Date  . No past surgeries    . Knee arthroscopy  03/24/2011    Procedure: ARTHROSCOPY KNEE;  Surgeon: Alta Corning, MD;  Location: [place];  Service: Orthopedics;  Laterality: Right;  partial medial menisectomy, chondroplaty patella, and medial medial plica   No family history on file. Social History  Substance Use Topics  . Smoking status: Never Smoker   . Smokeless tobacco: Current User    Types: Chew  . Alcohol Use: Yes     Comment: social, 1x weekly    Review of Systems  All systems reviewed and negative, other than as noted in HPI.   Allergies  Penicillins  Home Medications   Prior to Admission medications   Medication Sig Start Date End Date Taking? Authorizing Provider  albuterol (PROVENTIL HFA;VENTOLIN HFA) 108 (90 BASE) MCG/ACT inhaler Inhale 1-2 puffs into the lungs every 6 (six) hours as needed for wheezing or shortness of breath.     Historical Provider, MD  aspirin EC 81 MG tablet Take 81 mg by mouth daily.    Historical  Provider, MD  atorvastatin (LIPITOR) 40 MG tablet Take 40 mg by mouth daily.    Historical Provider, MD  esomeprazole (NEXIUM) 40 MG capsule Take 40 mg by mouth daily at 12 noon.    Historical Provider, MD  fluticasone-salmeterol (ADVAIR HFA) 230-21 MCG/ACT inhaler Inhale 1 puff into the lungs 2 (two) times daily.     Historical Provider, MD  glimepiride (AMARYL) 4 MG tablet Take 4 mg by mouth 2 (two) times daily.    Historical Provider, MD  HYDROcodone-acetaminophen (NORCO) 5-325 MG tablet Take 1 tablet by mouth every 6 (six) hours as needed for severe pain. 12/12/14   Mercedes Camprubi-Soms, PA-C  insulin glargine (LANTUS) 100 UNIT/ML injection Inject 28-30 Units into the skin daily. Per sliding scale.    Historical Provider, MD  KOMBIGLYZE XR 2.06-998 MG TB24 Take 2 tablets by mouth daily. 02/25/13   Historical Provider, MD  levofloxacin (LEVAQUIN) 750 MG tablet Take 1 tablet (750 mg total) by mouth daily. X 7 days Patient not taking: Reported on 12/12/2014 05/14/14   Evelina Bucy, MD  lisinopril-hydrochlorothiazide (PRINZIDE,ZESTORETIC) 20-25 MG per tablet Take 1 tablet by mouth daily. 02/25/13   Historical Provider, MD  Multiple Vitamin (MULTIVITAMIN WITH MINERALS) TABS tablet Take 1 tablet by mouth daily.    Historical Provider, MD  ondansetron (ZOFRAN) 8 MG tablet Take 1 tablet (8 mg total) by mouth every 8 (eight) hours as needed for nausea or vomiting. 12/12/14   Mercedes Camprubi-Soms, PA-C  polyethylene glycol (  MIRALAX / GLYCOLAX) packet Take 17 g by mouth daily. 12/12/14   Mercedes Camprubi-Soms, PA-C  predniSONE (DELTASONE) 20 MG tablet Take 3 tablets (60 mg total) by mouth daily. Patient not taking: Reported on 05/13/2014 11/27/13   Carlisle Cater, PA-C  predniSONE (DELTASONE) 20 MG tablet Take 2 tablets (40 mg total) by mouth once. Patient not taking: Reported on 12/12/2014 05/14/14   Evelina Bucy, MD  ranitidine (ZANTAC) 150 MG tablet Take 1 tablet (150 mg total) by mouth 2 (two) times  daily. 12/12/14   Mercedes Camprubi-Soms, PA-C   BP 126/82 mmHg  Pulse 86  Temp(Src) 98.1 F (36.7 C) (Oral)  Resp 18  Ht 5\' 10"  (1.778 m)  Wt 235 lb (106.595 kg)  BMI 33.72 kg/m2  SpO2 96% Physical Exam  Constitutional: He appears well-developed and well-nourished. No distress.  HENT:  Head: Normocephalic and atraumatic.  Eyes: Conjunctivae are normal. Right eye exhibits no discharge. Left eye exhibits no discharge.  Neck: Neck supple.  Cardiovascular: Normal rate, regular rhythm and normal heart sounds.  Exam reveals no gallop and no friction rub.   No murmur heard. Pulmonary/Chest: Effort normal and breath sounds normal. No respiratory distress.  Abdominal: Soft. He exhibits no distension. There is tenderness.  Epigastric/ruq tenderness w/o rebound or guarding.   Musculoskeletal: He exhibits no edema or tenderness.  Neurological: He is alert.  Skin: Skin is warm and dry.  Psychiatric: He has a normal mood and affect. His behavior is normal. Thought content normal.  Nursing note and vitals reviewed.   ED Course  Procedures (including critical care time) Labs Review Labs Reviewed  COMPREHENSIVE METABOLIC PANEL - Abnormal; Notable for the following:    Chloride 97 (*)    Glucose, Bld 321 (*)    Total Bilirubin 1.4 (*)    All other components within normal limits  LIPASE, BLOOD  CBC  URINALYSIS, ROUTINE W REFLEX MICROSCOPIC (NOT AT [facility])    Imaging Review No results found. I have personally reviewed and evaluated these images and lab results as part of my medical decision-making.   EKG Interpretation None      MDM   Final diagnoses:  Epigastric pain    53yM with abdominal pain. Consistent with prior biliary colic. Reports scheduled for chole later this week.  Treated symptomatically with improvement. Labs unremarkable. Doubt atypical acs or emergent abdominal pathology. W/u unremarkable. Continue symptomatic tx. Surgical fu.     Virgel Manifold, MD 01/21/15  [phone]

## 2015-01-12 NOTE — ED Notes (Signed)
Pt c/o abdominal pain x 1 week increasing yesterday. Pt is scheduled for gallbladder surgery this week. Pt reports nausea and vomited x 1.

## 2015-01-21 ENCOUNTER — Encounter (HOSPITAL_COMMUNITY)
Admission: RE | Admit: 2015-01-21 | Discharge: 2015-01-21 | Disposition: A | Discharge status: Home / Self Care | Payer: Managed Care, Other (non HMO) | Source: Ambulatory Visit | Attending: General Surgery | Admitting: General Surgery

## 2015-01-21 ENCOUNTER — Other Ambulatory Visit: Payer: Self-pay

## 2015-01-21 ENCOUNTER — Encounter (HOSPITAL_COMMUNITY): Payer: Self-pay

## 2015-01-21 DIAGNOSIS — K219 Gastro-esophageal reflux disease without esophagitis: Secondary | ICD-10-CM | POA: Diagnosis not present

## 2015-01-21 DIAGNOSIS — Z7984 Long term (current) use of oral hypoglycemic drugs: Secondary | ICD-10-CM | POA: Diagnosis not present

## 2015-01-21 DIAGNOSIS — E119 Type 2 diabetes mellitus without complications: Secondary | ICD-10-CM | POA: Diagnosis not present

## 2015-01-21 DIAGNOSIS — Z79899 Other long term (current) drug therapy: Secondary | ICD-10-CM | POA: Diagnosis not present

## 2015-01-21 DIAGNOSIS — E669 Obesity, unspecified: Secondary | ICD-10-CM | POA: Diagnosis not present

## 2015-01-21 DIAGNOSIS — J45909 Unspecified asthma, uncomplicated: Secondary | ICD-10-CM | POA: Diagnosis not present

## 2015-01-21 DIAGNOSIS — Z7982 Long term (current) use of aspirin: Secondary | ICD-10-CM | POA: Diagnosis not present

## 2015-01-21 DIAGNOSIS — K8010 Calculus of gallbladder with chronic cholecystitis without obstruction: Secondary | ICD-10-CM | POA: Diagnosis not present

## 2015-01-21 DIAGNOSIS — M1990 Unspecified osteoarthritis, unspecified site: Secondary | ICD-10-CM | POA: Diagnosis not present

## 2015-01-21 DIAGNOSIS — I10 Essential (primary) hypertension: Secondary | ICD-10-CM | POA: Diagnosis not present

## 2015-01-21 DIAGNOSIS — Z6835 Body mass index (BMI) 35.0-35.9, adult: Secondary | ICD-10-CM | POA: Diagnosis not present

## 2015-01-21 DIAGNOSIS — E785 Hyperlipidemia, unspecified: Secondary | ICD-10-CM | POA: Diagnosis not present

## 2015-01-21 DIAGNOSIS — K8020 Calculus of gallbladder without cholecystitis without obstruction: Secondary | ICD-10-CM | POA: Diagnosis present

## 2015-01-21 DIAGNOSIS — G4733 Obstructive sleep apnea (adult) (pediatric): Secondary | ICD-10-CM | POA: Diagnosis not present

## 2015-01-21 DIAGNOSIS — Z87891 Personal history of nicotine dependence: Secondary | ICD-10-CM | POA: Diagnosis not present

## 2015-01-21 DIAGNOSIS — J439 Emphysema, unspecified: Secondary | ICD-10-CM | POA: Diagnosis not present

## 2015-01-21 HISTORY — DX: Nasal polyp, unspecified: J33.9

## 2015-01-21 HISTORY — DX: Hyperlipidemia, unspecified: E78.5

## 2015-01-21 HISTORY — DX: Personal history of urinary calculi: Z87.442

## 2015-01-21 HISTORY — DX: Pneumonia, unspecified organism: J18.9

## 2015-01-21 HISTORY — DX: Sleep apnea, unspecified: G47.30

## 2015-01-21 HISTORY — DX: Gastric ulcer, unspecified as acute or chronic, without hemorrhage or perforation: K25.9

## 2015-01-21 HISTORY — DX: Unspecified osteoarthritis, unspecified site: M19.90

## 2015-01-21 LAB — BASIC METABOLIC PANEL
ANION GAP: 9 (ref 5–15)
BUN: 14 mg/dL (ref 6–20)
CO2: 28 mmol/L (ref 22–32)
Calcium: 9.2 mg/dL (ref 8.9–10.3)
Chloride: 102 mmol/L (ref 101–111)
Creatinine, Ser: 1.36 mg/dL — ABNORMAL HIGH (ref 0.61–1.24)
GFR, EST NON AFRICAN AMERICAN: 58 mL/min — AB (ref >60)
GLUCOSE: 320 mg/dL — AB (ref 65–99)
POTASSIUM: 4.6 mmol/L (ref 3.5–5.1)
Sodium: 139 mmol/L (ref 135–145)

## 2015-01-21 LAB — CBC
HEMATOCRIT: 37.9 % — AB (ref 39.0–52.0)
HEMOGLOBIN: 12.8 g/dL — AB (ref 13.0–17.0)
MCH: 30.2 pg (ref 26.0–34.0)
MCHC: 33.8 g/dL (ref 30.0–36.0)
MCV: 89.4 fL (ref 78.0–100.0)
PLATELETS: 134 10*3/uL — AB (ref 150–400)
RBC: 4.24 MIL/uL (ref 4.22–5.81)
RDW: 13.4 % (ref 11.5–15.5)
WBC: 4.6 10*3/uL (ref 4.0–10.5)

## 2015-01-21 LAB — GLUCOSE, CAPILLARY: Glucose-Capillary: 276 mg/dL — ABNORMAL HIGH (ref 65–99)

## 2015-01-21 NOTE — Pre-Procedure Instructions (Signed)
Juan [name]  01/21/2015     Your procedure is scheduled on : Thursday January 23, 2015 at 11:00 AM.  Report to [facility] Admitting at 9:00 AM.  Call this number if you have problems the morning of surgery: [phone]    Remember:  Do not eat food or drink liquids after midnight.  Take these medicines the morning of surgery with A SIP OF WATER : Albuterol inhaler if needed, Esomeprazole (Nexium), Advair inhaler   Stop taking any vitamins, herbal medications, Ibuprofen, Advil, Motrin, Aleve, Goodys, etc   Do NOT take any diabetic pills the morning of your surgery (NO Glimepiride/Amaryl, Kombiglyze)  How to Manage Your Diabetes Before Surgery   Why is it important to control my blood sugar before and after surgery?   Improving blood sugar levels before and after surgery helps healing and can limit problems.  A way of improving blood sugar control is eating a healthy diet by:  - Eating less sugar and carbohydrates  - Increasing activity/exercise  - Talk with your doctor about reaching your blood sugar goals  High blood sugars (greater than 180 mg/dL) can raise your risk of infections and slow down your recovery so you will need to focus on controlling your diabetes during the weeks before surgery.  Make sure that the doctor who takes care of your diabetes knows about your planned surgery including the date and location.  How do I manage my blood sugars before surgery?   Check your blood sugar at least 4 times a day, 2 days before surgery to make sure that they are not too high or low.   Check your blood sugar the morning of your surgery when you wake up and every 2 hours until you get to the Short-Stay unit.  If your blood sugar is less than 70 mg/dL, you will need to treat for low blood sugar by:  Treat a low blood sugar (less than 70 mg/dL) with 1/2 cup of clear juice (cranberry or apple), 4 glucose tablets, OR glucose gel.  Recheck blood sugar in 15  minutes after treatment (to make sure it is greater than 70 mg/dL).  If blood sugar is not greater than 70 mg/dL on re-check, call [phone] for further instructions.   Report your blood sugar to the Short-Stay nurse when you get to Short-Stay.  References:  [facility], 2007 "How to Manage your Diabetes Before and After Surgery".  What do I do about my diabetes medications?   Do not take oral diabetes medicines (pills) the morning of surgery.   THE NIGHT BEFORE SURGERY, take 24 units of Lantus Insulin.   Do not wear jewelry.  Do not wear lotions, powders, or cologne.    Men may shave face and neck.  Do not bring valuables to the hospital.  [facility] is not responsible for any belongings or valuables.  Contacts, dentures or bridgework may not be worn into surgery.  Leave your suitcase in the car.  After surgery it may be brought to your room.  For patients admitted to the hospital, discharge time will be determined by your treatment team.  Patients discharged the day of surgery will not be allowed to drive home.   Name and phone number of your driver:    Special instructions:  Shower using CHG soap the night before and the morning of your surgery  Please read over the following fact sheets that you were given. Pain Booklet, Coughing and Deep  Breathing and Surgical Site Infection Prevention

## 2015-01-21 NOTE — Progress Notes (Signed)
PCP is Harlan Stains  Patient informed Nurse that he had a stress test. Results in EPIC on 02/28/13. Patient denied having a cardiac cath, but did inform Nurse that he has sleep apnea, however he does not wear his CPAP at night because he has "nasal polyps" and his nose "closes at night." Patient stated he needed to call his physician and see if he could get a CPAP mask instead of the one that "just sticks in my nose". Patient informed Nurse that he stops breathing more when laying on his back than on his side, so he sleeps on his side at night.    Nurse inquired about blood glucose levels and patient stated the highest his blood has been was 168 and the lowest recently was 122. Patient stated his last A1C was 6.3. CBG on arrival to PAT was 276. Patient stated "I just ate a big lunch at work.Marland Kitchen..Marland Kitchenwe had ham, [place]....it was a holiday lunch. I had some banana pudding too."

## 2015-01-22 LAB — HEMOGLOBIN A1C
Hgb A1c MFr Bld: 7.6 % — ABNORMAL HIGH (ref 4.8–5.6)
Mean Plasma Glucose: 171 mg/dL

## 2015-01-22 MED ORDER — CIPROFLOXACIN IN D5W 400 MG/200ML IV SOLN
400.0000 mg | INTRAVENOUS | Status: AC
  Administered 2015-01-23: 400 mg via INTRAVENOUS
  Filled 2015-01-22: qty 200

## 2015-01-22 MED ORDER — CHLORHEXIDINE GLUCONATE 4 % EX LIQD: 1.0000 "application " | Freq: Once | CUTANEOUS | Status: DC

## 2015-01-22 NOTE — Progress Notes (Signed)
Anesthesia Chart Review: Patient is a 54 year old male scheduled for laparoscopic cholecystectomy on 01/23/15 by Dr. Georganna Skeans.  History includes OSA (not using CPAP due to nasal polyps), DM2, non-smoker, asthma, COPD, hiatal hernia, SOB, HTN, GERD, PUD, HLD. BMI is consistent with obesity. PCP is Dr. Harlan Stains.  Meds include Advair, albuterol, ASA 81mg , Lipitor, Nexium, Amaryl, Norco, Kombiglyze, Lantus, lisinopril-HCTZ, Percocet, ranitidine.  01/21/15 EKG: NSR, possible LAE. Since last tracing, no significant change.  02/28/13 Nuclear stress test: IMPRESSION: Stable normal examination without evidence of pharmacological induced myocardial ischemia. The calculated ejection fraction is 66%.  12/12/14 ABD ACUTE W/ CHEST: IMPRESSION: 1. No acute cardiopulmonary disease. 2. Moderate colonic stool burden without evidence of enteric Obstruction.  Preoperative labs noted. Cr 1.36. Glucose 320, with A1C of 7.6 (consistent with mean plasma glucose of 171). BMET results routed to Dr. Grandville Silos. H/H 12.8/37.9, PLT 134K. (At PAT he reported home glucose levels have been 122-168. CBG on arrival to PAT was 276. Patient reported that he had just come from his work holiday lunch including ham, [place], banana pudding, etc.) Based on his home readings and A1C, I think his elevated non-fasting glucose was likely related to dietary intake at his work holiday lunch party. He will get a fasting CBG on arrival. If results are acceptable then I anticipate that he can proceed as planned.  George Hugh [facility] Short Stay Center/Anesthesiology Phone [phone] 01/22/2015 10:18 AM

## 2015-01-22 NOTE — H&P (Signed)
History of Present Illness Juan Neri E. Grandville Silos MD; 01/01/2015 11:28 AM) Patient words: gallbladder.  The patient is a 54 year old male who presents with symptomatic choledocholithiasis. Juan [name] has been having intermittent right upper quadrant pain associated with nausea for several months. Dr. Harlan Stains asked me to see him in consultation for consideration of cholecystectomy. During one of his episodes, in October 2015, he underwent CT angiogram of his chest. At that time he was noted to have gallstones. These episodes have become more frequent. The pain is localized in his epigastrium and right upper quadrant and is exacerbated by eating.   Other Problems Juan [name], CMA; 01/01/2015 10:58 AM) Arthritis Asthma Back Pain Cholelithiasis Chronic Obstructive Lung Disease Diabetes Mellitus Emphysema Of Lung Enlarged Prostate Gastric Ulcer Gastroesophageal Reflux Disease Hemorrhoids High blood pressure Inguinal Hernia Kidney Stone Sleep Apnea  Past Surgical History Juan [name], [place]; 01/01/2015 10:58 AM) Colon Polyp Removal - Colonoscopy Knee Surgery Right.  Diagnostic Studies History Juan [name], CMA; 01/01/2015 10:58 AM) Colonoscopy 1-5 years ago  Allergies Juan [name], CMA; 01/01/2015 11:00 AM) PenicillAMINE *ASSORTED CLASSES*  Medication History (Juan [name], CMA; 01/01/2015 11:02 AM) Advair Diskus (500-50MCG/DOSE Aero Pow Br Act, Inhalation) Active. Aspirin (81MG  Tablet Chewable, Oral) Active. Atorvastatin Calcium (40MG  Tablet, Oral) Active. NexIUM (40MG  Capsule DR, Oral) Active. Glimepiride (4MG  Tablet, Oral) Active. Kombiglyze XR (2.5-1000MG  Tablet ER 24HR, Oral) Active. Lisinopril-Hydrochlorothiazide (20-25MG  Tablet, Oral) Active. NexIUM 24HR (20MG  Tablet DR, Oral) Active. Medications Reconciled  Social History Juan [name], CMA; 01/01/2015 10:58 AM) Alcohol use Moderate alcohol use. Caffeine use Carbonated beverages,  Coffee, Tea. Illicit drug use Remotely quit drug use. Tobacco use Former smoker.  Family History Juan [name], [place]; 01/01/2015 10:58 AM) Cancer Brother. Diabetes Mellitus Father. Hypertension Father. Respiratory Condition Father. Seizure disorder Brother.    Review of Systems Juan [name] CMA; 01/01/2015 10:58 AM) General Not Present- Appetite Loss, Chills, Fatigue, Fever, Night Sweats, Weight Gain and Weight Loss. Skin Not Present- Change in Wart/Mole, Dryness, Hives, Jaundice, New Lesions, Non-Healing Wounds, Rash and Ulcer. HEENT Present- Seasonal Allergies. Not Present- Earache, Hearing Loss, Hoarseness, Nose Bleed, Oral Ulcers, Ringing in the Ears, Sinus Pain, Sore Throat, Visual Disturbances, Wears glasses/contact lenses and Yellow Eyes. Respiratory Present- Chronic Cough and Snoring. Not Present- Bloody sputum, Difficulty Breathing and Wheezing. Cardiovascular Present- Leg Cramps and Palpitations. Not Present- Chest Pain, Difficulty Breathing Lying Down, Rapid Heart Rate, Shortness of Breath and Swelling of Extremities. Gastrointestinal Present- Abdominal Pain, Bloating, Excessive gas, Nausea and Vomiting. Not Present- Bloody Stool, Change in Bowel Habits, Chronic diarrhea, Constipation, Difficulty Swallowing, Gets full quickly at meals, Hemorrhoids, Indigestion and Rectal Pain. Male Genitourinary Not Present- Blood in Urine, Change in Urinary Stream, Frequency, Impotence, Nocturia, Painful Urination, Urgency and Urine Leakage. Musculoskeletal Present- Joint Stiffness. Not Present- Back Pain, Joint Pain, Muscle Pain, Muscle Weakness and Swelling of Extremities. Neurological Not Present- Decreased Memory, Fainting, Headaches, Numbness, Seizures, Tingling, Tremor, Trouble walking and Weakness. Psychiatric Not Present- Anxiety, Bipolar, Change in Sleep Pattern, Depression, Fearful and Frequent crying. Endocrine Not Present- Cold Intolerance, Excessive Hunger, Hair Changes, Heat  Intolerance, Hot flashes and New Diabetes. Hematology Present- Easy Bruising and Excessive bleeding. Not Present- Gland problems, HIV and Persistent Infections.  Vitals (Juan [name] CMA; 01/01/2015 10:59 AM) 01/01/2015 10:59 AM Weight: 231 lb Height: 68in Body Surface Area: 2.17 m Body Mass Index: 35.12 kg/m  Temp.: 26F(Temporal)  Pulse: 81 (Regular)  BP: 128/78 (Sitting, Left Arm, Standard)       Physical Exam Juan Neri E. Grandville Silos MD; 01/01/2015  11:29 AM) General Mental Status-Alert. General Appearance-Consistent with stated age. Hydration-Well hydrated. Voice-Normal.  Head and Neck Head-normocephalic, atraumatic with no lesions or palpable masses. Trachea-midline. Thyroid Gland Characteristics - normal size and consistency.  Eye Eyeball - Bilateral-Extraocular movements intact. Sclera/Conjunctiva - Bilateral-No scleral icterus.  Chest and Lung Exam Chest and lung exam reveals -quiet, even and easy respiratory effort with no use of accessory muscles and on auscultation, normal breath sounds, no adventitious sounds and normal vocal resonance. Inspection Chest Wall - Normal. Back - normal.  Cardiovascular Cardiovascular examination reveals -normal heart sounds, regular rate and rhythm with no murmurs and normal pedal pulses bilaterally.  Abdomen Inspection Inspection of the abdomen reveals - No Hernias. Palpation/Percussion Palpation and Percussion of the abdomen reveal - Soft, No Rebound tenderness, No Rigidity (guarding) and No hepatosplenomegaly. Tenderness - Right Upper Quadrant. Auscultation Auscultation of the abdomen reveals - Bowel sounds normal. Note: Diastases recti is present, mild tenderness in the right upper quadrant without mass   Neurologic Neurologic evaluation reveals -alert and oriented x 3 with no impairment of recent or remote memory. Mental Status-Normal.  Musculoskeletal Global Assessment -Note: no  gross deformities.  Normal Exam - Left-Upper Extremity Strength Normal and Lower Extremity Strength Normal. Normal Exam - Right-Upper Extremity Strength Normal and Lower Extremity Strength Normal.  Lymphatic Head & Neck  General Head & Neck Lymphatics: Bilateral - Description - Normal. Axillary  General Axillary Region: Bilateral - Description - Normal. Tenderness - Non Tender. Femoral & Inguinal  Generalized Femoral & Inguinal Lymphatics: Bilateral - Description - No Generalized lymphadenopathy.    Assessment & Plan Juan Neri E. Grandville Silos MD; 01/01/2015 11:30 AM) SYMPTOMATIC CHOLELITHIASIS (K80.20) Impression: I have offered laparoscopic cholecystectomy. Procedure, risks, and benefits were discussed in detail with him. I provided him some literature regarding the procedure and postoperative recovery. I look forward to scheduling this before the end of the year.  Georganna Skeans, MD, MPH, FACS Trauma: [phone] General Surgery: [phone]

## 2015-01-23 ENCOUNTER — Ambulatory Visit (HOSPITAL_COMMUNITY): Payer: Managed Care, Other (non HMO) | Admitting: Vascular Surgery

## 2015-01-23 ENCOUNTER — Ambulatory Visit (HOSPITAL_COMMUNITY): Payer: Managed Care, Other (non HMO) | Admitting: Anesthesiology

## 2015-01-23 ENCOUNTER — Encounter (HOSPITAL_COMMUNITY)
Admission: RE | Disposition: A | Discharge status: Home / Self Care | Payer: Self-pay | Source: Ambulatory Visit | Attending: General Surgery

## 2015-01-23 ENCOUNTER — Encounter (HOSPITAL_COMMUNITY): Payer: Self-pay | Admitting: Surgery

## 2015-01-23 ENCOUNTER — Ambulatory Visit (HOSPITAL_COMMUNITY)
Admission: RE | Admit: 2015-01-23 | Discharge: 2015-01-23 | Disposition: A | Discharge status: Home / Self Care | Payer: Managed Care, Other (non HMO) | Source: Ambulatory Visit | Attending: General Surgery | Admitting: General Surgery

## 2015-01-23 DIAGNOSIS — K8020 Calculus of gallbladder without cholecystitis without obstruction: Secondary | ICD-10-CM

## 2015-01-23 DIAGNOSIS — Z7984 Long term (current) use of oral hypoglycemic drugs: Secondary | ICD-10-CM | POA: Insufficient documentation

## 2015-01-23 DIAGNOSIS — K8010 Calculus of gallbladder with chronic cholecystitis without obstruction: Secondary | ICD-10-CM | POA: Diagnosis not present

## 2015-01-23 DIAGNOSIS — Z6835 Body mass index (BMI) 35.0-35.9, adult: Secondary | ICD-10-CM | POA: Insufficient documentation

## 2015-01-23 DIAGNOSIS — Z87891 Personal history of nicotine dependence: Secondary | ICD-10-CM | POA: Insufficient documentation

## 2015-01-23 DIAGNOSIS — J45909 Unspecified asthma, uncomplicated: Secondary | ICD-10-CM | POA: Insufficient documentation

## 2015-01-23 DIAGNOSIS — G4733 Obstructive sleep apnea (adult) (pediatric): Secondary | ICD-10-CM | POA: Insufficient documentation

## 2015-01-23 DIAGNOSIS — K219 Gastro-esophageal reflux disease without esophagitis: Secondary | ICD-10-CM | POA: Insufficient documentation

## 2015-01-23 DIAGNOSIS — E785 Hyperlipidemia, unspecified: Secondary | ICD-10-CM | POA: Insufficient documentation

## 2015-01-23 DIAGNOSIS — Z7982 Long term (current) use of aspirin: Secondary | ICD-10-CM | POA: Insufficient documentation

## 2015-01-23 DIAGNOSIS — M1990 Unspecified osteoarthritis, unspecified site: Secondary | ICD-10-CM | POA: Insufficient documentation

## 2015-01-23 DIAGNOSIS — J439 Emphysema, unspecified: Secondary | ICD-10-CM | POA: Insufficient documentation

## 2015-01-23 DIAGNOSIS — I10 Essential (primary) hypertension: Secondary | ICD-10-CM | POA: Insufficient documentation

## 2015-01-23 DIAGNOSIS — E119 Type 2 diabetes mellitus without complications: Secondary | ICD-10-CM | POA: Insufficient documentation

## 2015-01-23 DIAGNOSIS — Z79899 Other long term (current) drug therapy: Secondary | ICD-10-CM | POA: Insufficient documentation

## 2015-01-23 DIAGNOSIS — E669 Obesity, unspecified: Secondary | ICD-10-CM | POA: Insufficient documentation

## 2015-01-23 HISTORY — PX: CHOLECYSTECTOMY: SHX55

## 2015-01-23 LAB — GLUCOSE, CAPILLARY
GLUCOSE-CAPILLARY: 187 mg/dL — AB (ref 65–99)
Glucose-Capillary: 240 mg/dL — ABNORMAL HIGH (ref 65–99)

## 2015-01-23 SURGERY — LAPAROSCOPIC CHOLECYSTECTOMY WITH INTRAOPERATIVE CHOLANGIOGRAM
Anesthesia: General | Site: Abdomen

## 2015-01-23 MED ORDER — ROCURONIUM BROMIDE 100 MG/10ML IV SOLN
INTRAVENOUS | Status: DC | PRN
  Administered 2015-01-23: 50 mg via INTRAVENOUS

## 2015-01-23 MED ORDER — GLYCOPYRROLATE 0.2 MG/ML IJ SOLN
INTRAMUSCULAR | Status: DC | PRN
  Administered 2015-01-23: 0.6 mg via INTRAVENOUS

## 2015-01-23 MED ORDER — OXYCODONE HCL 5 MG PO TABS
ORAL_TABLET | ORAL | Status: AC
  Filled 2015-01-23: qty 1

## 2015-01-23 MED ORDER — OXYCODONE HCL 5 MG/5ML PO SOLN: 5.0000 mg | Freq: Once | ORAL | Status: AC | PRN

## 2015-01-23 MED ORDER — MIDAZOLAM HCL 2 MG/2ML IJ SOLN
INTRAMUSCULAR | Status: AC
  Filled 2015-01-23: qty 2

## 2015-01-23 MED ORDER — MIDAZOLAM HCL 5 MG/5ML IJ SOLN
INTRAMUSCULAR | Status: DC | PRN
  Administered 2015-01-23: 2 mg via INTRAVENOUS

## 2015-01-23 MED ORDER — PROPOFOL 10 MG/ML IV BOLUS
INTRAVENOUS | Status: DC | PRN
  Administered 2015-01-23: 200 mg via INTRAVENOUS

## 2015-01-23 MED ORDER — ONDANSETRON HCL 4 MG/2ML IJ SOLN
INTRAMUSCULAR | Status: AC
  Filled 2015-01-23: qty 2

## 2015-01-23 MED ORDER — LACTATED RINGERS IV SOLN
INTRAVENOUS | Status: DC
  Administered 2015-01-23: 09:00:00 via INTRAVENOUS

## 2015-01-23 MED ORDER — BUPIVACAINE-EPINEPHRINE (PF) 0.25% -1:200000 IJ SOLN
INTRAMUSCULAR | Status: AC
  Filled 2015-01-23: qty 30

## 2015-01-23 MED ORDER — LIDOCAINE HCL (CARDIAC) 20 MG/ML IV SOLN
INTRAVENOUS | Status: AC
  Filled 2015-01-23: qty 5

## 2015-01-23 MED ORDER — INSULIN ASPART 100 UNIT/ML ~~LOC~~ SOLN
8.0000 [IU] | Freq: Once | SUBCUTANEOUS | Status: AC
  Administered 2015-01-23: 8 [IU] via SUBCUTANEOUS

## 2015-01-23 MED ORDER — SODIUM CHLORIDE 0.9 % IV SOLN
INTRAVENOUS | Status: DC | PRN
  Administered 2015-01-23: 10:00:00

## 2015-01-23 MED ORDER — OXYCODONE HCL 5 MG PO TABS: 5.0000 mg | ORAL_TABLET | ORAL | Status: DC | PRN

## 2015-01-23 MED ORDER — SODIUM CHLORIDE 0.9 % IR SOLN
Status: DC | PRN
  Administered 2015-01-23: 1000 mL

## 2015-01-23 MED ORDER — FENTANYL CITRATE (PF) 250 MCG/5ML IJ SOLN
INTRAMUSCULAR | Status: AC
  Filled 2015-01-23: qty 5

## 2015-01-23 MED ORDER — METOCLOPRAMIDE HCL 5 MG/ML IJ SOLN: 10.0000 mg | Freq: Once | INTRAMUSCULAR | Status: DC | PRN

## 2015-01-23 MED ORDER — ONDANSETRON HCL 4 MG/2ML IJ SOLN
INTRAMUSCULAR | Status: DC | PRN
  Administered 2015-01-23: 4 mg via INTRAVENOUS

## 2015-01-23 MED ORDER — LIDOCAINE HCL (CARDIAC) 20 MG/ML IV SOLN
INTRAVENOUS | Status: DC | PRN
  Administered 2015-01-23: 100 mg via INTRAVENOUS

## 2015-01-23 MED ORDER — HYDROMORPHONE HCL 1 MG/ML IJ SOLN
INTRAMUSCULAR | Status: AC
  Administered 2015-01-23: 0.25 mg via INTRAVENOUS
  Filled 2015-01-23: qty 1

## 2015-01-23 MED ORDER — INSULIN ASPART 100 UNIT/ML ~~LOC~~ SOLN
SUBCUTANEOUS | Status: AC
  Filled 2015-01-23: qty 8

## 2015-01-23 MED ORDER — PROPOFOL 10 MG/ML IV BOLUS
INTRAVENOUS | Status: AC
  Filled 2015-01-23: qty 20

## 2015-01-23 MED ORDER — FENTANYL CITRATE (PF) 100 MCG/2ML IJ SOLN
INTRAMUSCULAR | Status: DC | PRN
  Administered 2015-01-23 (×10): 50 ug via INTRAVENOUS

## 2015-01-23 MED ORDER — BUPIVACAINE-EPINEPHRINE 0.25% -1:200000 IJ SOLN
INTRAMUSCULAR | Status: DC | PRN
  Administered 2015-01-23: 30 mL

## 2015-01-23 MED ORDER — ROCURONIUM BROMIDE 50 MG/5ML IV SOLN
INTRAVENOUS | Status: AC
  Filled 2015-01-23: qty 1

## 2015-01-23 MED ORDER — NEOSTIGMINE METHYLSULFATE 10 MG/10ML IV SOLN
INTRAVENOUS | Status: DC | PRN
  Administered 2015-01-23: 4 mg via INTRAVENOUS

## 2015-01-23 MED ORDER — OXYCODONE HCL 5 MG PO TABS
5.0000 mg | ORAL_TABLET | Freq: Once | ORAL | Status: AC | PRN
  Administered 2015-01-23: 5 mg via ORAL

## 2015-01-23 MED ORDER — 0.9 % SODIUM CHLORIDE (POUR BTL) OPTIME
TOPICAL | Status: DC | PRN
  Administered 2015-01-23: 1000 mL

## 2015-01-23 MED ORDER — HYDROMORPHONE HCL 1 MG/ML IJ SOLN
0.2500 mg | INTRAMUSCULAR | Status: DC | PRN
  Administered 2015-01-23 (×2): 0.25 mg via INTRAVENOUS

## 2015-01-23 SURGICAL SUPPLY — 47 items
APPLIER CLIP 5 13 M/L LIGAMAX5 (MISCELLANEOUS) ×3
BLADE SURG ROTATE 9660 (MISCELLANEOUS) ×3 IMPLANT
CANISTER SUCTION 2500CC (MISCELLANEOUS) ×3 IMPLANT
CHLORAPREP W/TINT 26ML (MISCELLANEOUS) ×3 IMPLANT
CLIP APPLIE 5 13 M/L LIGAMAX5 (MISCELLANEOUS) ×1 IMPLANT
COVER MAYO STAND STRL (DRAPES) ×3 IMPLANT
COVER SURGICAL LIGHT HANDLE (MISCELLANEOUS) ×3 IMPLANT
DRAPE C-ARM 42X72 X-RAY (DRAPES) ×3 IMPLANT
ELECT REM PT RETURN 9FT ADLT (ELECTROSURGICAL) ×3
ELECTRODE REM PT RTRN 9FT ADLT (ELECTROSURGICAL) ×1 IMPLANT
FILTER SMOKE EVAC LAPAROSHD (FILTER) ×3 IMPLANT
GLOVE BIO SURGEON STRL SZ7 (GLOVE) ×3 IMPLANT
GLOVE BIO SURGEON STRL SZ8 (GLOVE) ×3 IMPLANT
GLOVE BIOGEL PI IND STRL 7.0 (GLOVE) ×1 IMPLANT
GLOVE BIOGEL PI IND STRL 7.5 (GLOVE) ×1 IMPLANT
GLOVE BIOGEL PI IND STRL 8 (GLOVE) ×1 IMPLANT
GLOVE BIOGEL PI INDICATOR 7.0 (GLOVE) ×2
GLOVE BIOGEL PI INDICATOR 7.5 (GLOVE) ×2
GLOVE BIOGEL PI INDICATOR 8 (GLOVE) ×2
GLOVE SS N UNI LF 7.5 STRL (GLOVE) ×3 IMPLANT
GOWN STRL REUS W/ TWL LRG LVL3 (GOWN DISPOSABLE) ×2 IMPLANT
GOWN STRL REUS W/ TWL XL LVL3 (GOWN DISPOSABLE) ×1 IMPLANT
GOWN STRL REUS W/TWL LRG LVL3 (GOWN DISPOSABLE) ×4
GOWN STRL REUS W/TWL XL LVL3 (GOWN DISPOSABLE) ×2
KIT BASIN OR (CUSTOM PROCEDURE TRAY) ×3 IMPLANT
KIT ROOM TURNOVER OR (KITS) ×3 IMPLANT
L-HOOK LAP DISP 36CM (ELECTROSURGICAL) ×3
LHOOK LAP DISP 36CM (ELECTROSURGICAL) ×1 IMPLANT
LIQUID BAND (GAUZE/BANDAGES/DRESSINGS) ×3 IMPLANT
NEEDLE 22X1 1/2 (OR ONLY) (NEEDLE) ×3 IMPLANT
NS IRRIG 1000ML POUR BTL (IV SOLUTION) ×3 IMPLANT
PAD ARMBOARD 7.5X6 YLW CONV (MISCELLANEOUS) ×3 IMPLANT
PENCIL BUTTON HOLSTER BLD 10FT (ELECTRODE) ×3 IMPLANT
POUCH RETRIEVAL ECOSAC 10 (ENDOMECHANICALS) ×1 IMPLANT
POUCH RETRIEVAL ECOSAC 10MM (ENDOMECHANICALS) ×2
SCISSORS LAP 5X35 DISP (ENDOMECHANICALS) ×3 IMPLANT
SET CHOLANGIOGRAPH 5 50 .035 (SET/KITS/TRAYS/PACK) ×3 IMPLANT
SET IRRIG TUBING LAPAROSCOPIC (IRRIGATION / IRRIGATOR) ×3 IMPLANT
SLEEVE ENDOPATH XCEL 5M (ENDOMECHANICALS) ×6 IMPLANT
SPECIMEN JAR SMALL (MISCELLANEOUS) ×3 IMPLANT
SUT VIC AB 4-0 PS2 27 (SUTURE) ×3 IMPLANT
TOWEL OR 17X24 6PK STRL BLUE (TOWEL DISPOSABLE) ×3 IMPLANT
TOWEL OR 17X26 10 PK STRL BLUE (TOWEL DISPOSABLE) ×3 IMPLANT
TRAY LAPAROSCOPIC MC (CUSTOM PROCEDURE TRAY) ×3 IMPLANT
TROCAR XCEL BLUNT TIP 100MML (ENDOMECHANICALS) ×3 IMPLANT
TROCAR XCEL NON-BLD 5MMX100MML (ENDOMECHANICALS) ×3 IMPLANT
TUBING INSUFFLATION (TUBING) ×3 IMPLANT

## 2015-01-23 NOTE — Op Note (Signed)
01/23/2015  10:39 AM  PATIENT:  Juan [name]  54 y.o. male  PRE-OPERATIVE DIAGNOSIS:  symptomatic cholelithiasis  POST-OPERATIVE DIAGNOSIS:  symptomatic cholelithiasis  PROCEDURE:  Procedure(s): LAPAROSCOPIC CHOLECYSTECTOMY   SURGEON:  Surgeon(s): Georganna Skeans, MD  ASSISTANTS: none   ANESTHESIA:   local and general  EBL:  Total I/O In: 800 [I.V.:800] Out: 25 [Blood:25]  BLOOD ADMINISTERED:none  DRAINS: none   SPECIMEN:  Excision  DISPOSITION OF SPECIMEN:  PATHOLOGY  COUNTS:  YES  DICTATION: .Dragon Dictation Findings: Very short and narrow cystic duct precluded cholangiogram  Procedure in detail: Gracen presents for cholecystectomy. He was identified in the preop holding area. Informed consent was obtained. He received intravenous antibiotics. He was brought to the operative room and general endotracheal anesthesia was administered by the anesthesia staff. We did a time out procedure.The infraumbilical region was infiltrated with local. Infraumbilical incision was made. Subcutaneous tissues were dissected down revealing the anterior fascia. This was divided sharply along the midline. Peritoneal cavity was entered under direct vision without complication. A 0 Vicryl pursestring was placed around the fascial opening. Hassan trocar was inserted into the abdomen. The abdomen was insufflated with carbon dioxide in standard fashion. Under direct vision a 5 mm epigastric and 5 mm right lateral ports 2 were placed. Local was used at each port site. The dome of the gallbladder was retracted superior medially. The infundibulum was retracted inferior laterally. Dissection began laterally and progressed medially easily identifying the cystic duct. It was further dissected. It was noted to be quite short as well as narrow. I was therefore unable to do a). Dissection continued until critical view was obtained between the cystic duct, the infundibulum, and the liver. Once we had  excellent visualization, 3 clips were placed proximally on the cystic duct, one was placed distally and it was divided. Further dissection revealed the cystic artery. This was clipped twice proximally and divided distally with cautery. Gallbladder was removed off the liver bed using cautery and achieving excellent hemostasis along the way. The gallbladder was placed in a bag and removed from the abdomen via the infraumbilical port site. It was sent to pathology. The liver bed was copiously irrigated. Hemostasis was ensured with cautery. Irrigation returned clear. Liver was rechecked and it was dry. Clips remain in good position. Ports removed under direct vision. Pneumoperitoneum was released. Infraumbilical fascia was closed by tying the pursestring. All 4 wounds were copiously irrigated and the skin of each was closed with running 4-0 Vicryl subcuticular followed by Dermabond. All counts were correct. He tolerated the procedure well without apparent complications and was taken recovery in stable condition.  PATIENT DISPOSITION:  PACU - hemodynamically stable.   Delay start of Pharmacological VTE agent (>24hrs) due to surgical blood loss or risk of bleeding:  not applicable  Georganna Skeans, MD, MPH, FACS Pager: [phone]  12/22/201610:39 AM

## 2015-01-23 NOTE — Progress Notes (Signed)
To jamie hart rn as caregiver

## 2015-01-23 NOTE — Anesthesia Postprocedure Evaluation (Signed)
Anesthesia Post Note  Patient: Juan [name]  Procedure(s) Performed: Procedure(s) (LRB): LAPAROSCOPIC CHOLECYSTECTOMY  (N/A)  Patient location during evaluation: PACU Anesthesia Type: General Level of consciousness: awake and awake and alert Pain management: pain level controlled Vital Signs Assessment: post-procedure vital signs reviewed and stable Respiratory status: spontaneous breathing and nonlabored ventilation Anesthetic complications: no    Last Vitals:  Filed Vitals:   01/23/15 1257 01/23/15 1304  BP:  137/90  Pulse:  91  Temp: 36.7 C   Resp:  20    Last Pain:  Filed Vitals:   01/23/15 1306  PainSc: 5                  Arnika Larzelere COKER

## 2015-01-23 NOTE — Interval H&P Note (Signed)
History and Physical Interval Note:  01/23/2015 9:22 AM  Juan [name]  has presented today for surgery, with the diagnosis of symptomatic cholelithiasis  The various methods of treatment have been discussed with the patient and family. After consideration of risks, benefits and other options for treatment, the patient has consented to  Procedure(s): LAPAROSCOPIC CHOLECYSTECTOMY WITH INTRAOPERATIVE CHOLANGIOGRAM (N/A) as a surgical intervention .  The patient's history has been reviewed, patient re-examined, no change in status, stable for surgery.  I have reviewed the patient's chart and labs.  Questions were answered to the patient's satisfaction.     [name]

## 2015-01-23 NOTE — Anesthesia Preprocedure Evaluation (Addendum)
Anesthesia Evaluation  Patient identified by MRN, date of birth, ID band Patient awake    Reviewed: Allergy & Precautions, NPO status , Patient's Chart, lab work & pertinent test results  Airway Mallampati: III  TM Distance: >3 FB Neck ROM: Full    Dental  (+) Teeth Intact, Dental Advisory Given   Pulmonary    breath sounds clear to auscultation       Cardiovascular hypertension,  Rhythm:Regular Rate:Normal     Neuro/Psych    GI/Hepatic   Endo/Other  diabetes  Renal/GU      Musculoskeletal   Abdominal (+) + obese,   Peds  Hematology   Anesthesia Other Findings   Reproductive/Obstetrics                            Anesthesia Physical Anesthesia Plan  ASA: III  Anesthesia Plan: General   Post-op Pain Management:    Induction: Intravenous  Airway Management Planned: Oral ETT  Additional Equipment:   Intra-op Plan:   Post-operative Plan: Extubation in OR  Informed Consent: I have reviewed the patients History and Physical, chart, labs and discussed the procedure including the risks, benefits and alternatives for the proposed anesthesia with the patient or authorized representative who has indicated his/her understanding and acceptance.   Dental advisory given  Plan Discussed with: CRNA and Anesthesiologist  Anesthesia Plan Comments:         Anesthesia Quick Evaluation

## 2015-01-23 NOTE — Progress Notes (Signed)
pts bs=240 dr Linna Caprice notified orders obtained and carried out

## 2015-01-23 NOTE — Anesthesia Procedure Notes (Signed)
Procedure Name: Intubation Date/Time: 01/23/2015 9:49 AM Performed by: Kyung Rudd Pre-anesthesia Checklist: Patient identified, Emergency Drugs available, Patient being monitored, Suction available and Timeout performed Patient Re-evaluated:Patient Re-evaluated prior to inductionOxygen Delivery Method: Circle system utilized Preoxygenation: Pre-oxygenation with 100% oxygen Intubation Type: IV induction Ventilation: Mask ventilation without difficulty Laryngoscope Size: Miller and 2 Grade View: Grade II Tube type: Oral Tube size: 7.5 mm Number of attempts: 1 Airway Equipment and Method: Stylet Placement Confirmation: ETT inserted through vocal cords under direct vision,  breath sounds checked- equal and bilateral,  positive ETCO2 and CO2 detector Secured at: 23 cm Dental Injury: Teeth and Oropharynx as per pre-operative assessment  Comments: AOI by Verdie Drown, SRNA under supervision by anesthesiologist and CRNA

## 2015-01-23 NOTE — Transfer of Care (Signed)
Immediate Anesthesia Transfer of Care Note  Patient: Juan [name]  Procedure(s) Performed: Procedure(s): LAPAROSCOPIC CHOLECYSTECTOMY  (N/A)  Patient Location: PACU  Anesthesia Type:General  Level of Consciousness: awake, alert  and oriented  Airway & Oxygen Therapy: Patient Spontanous Breathing and Patient connected to face mask oxygen  Post-op Assessment: Report given to RN, Post -op Vital signs reviewed and stable and Patient moving all extremities  Post vital signs: Reviewed and stable  Last Vitals:  Filed Vitals:   01/23/15 0907  BP: 147/98  Pulse: 102  Temp: 36.7 C  Resp: 18    Complications: No apparent anesthesia complications

## 2015-01-24 ENCOUNTER — Encounter (HOSPITAL_COMMUNITY): Payer: Self-pay | Admitting: General Surgery

## 2015-03-29 ENCOUNTER — Emergency Department (HOSPITAL_COMMUNITY)
Admission: EM | Admit: 2015-03-29 | Discharge: 2015-03-29 | Disposition: A | Discharge status: Home / Self Care | Payer: Managed Care, Other (non HMO) | Attending: Emergency Medicine | Admitting: Emergency Medicine

## 2015-03-29 ENCOUNTER — Emergency Department (HOSPITAL_COMMUNITY): Payer: Managed Care, Other (non HMO)

## 2015-03-29 ENCOUNTER — Encounter (HOSPITAL_COMMUNITY): Payer: Self-pay | Admitting: Emergency Medicine

## 2015-03-29 DIAGNOSIS — J441 Chronic obstructive pulmonary disease with (acute) exacerbation: Secondary | ICD-10-CM | POA: Insufficient documentation

## 2015-03-29 DIAGNOSIS — Z79899 Other long term (current) drug therapy: Secondary | ICD-10-CM | POA: Insufficient documentation

## 2015-03-29 DIAGNOSIS — Z7982 Long term (current) use of aspirin: Secondary | ICD-10-CM | POA: Diagnosis not present

## 2015-03-29 DIAGNOSIS — K219 Gastro-esophageal reflux disease without esophagitis: Secondary | ICD-10-CM | POA: Diagnosis not present

## 2015-03-29 DIAGNOSIS — R0602 Shortness of breath: Secondary | ICD-10-CM | POA: Diagnosis present

## 2015-03-29 DIAGNOSIS — Z7984 Long term (current) use of oral hypoglycemic drugs: Secondary | ICD-10-CM | POA: Diagnosis not present

## 2015-03-29 DIAGNOSIS — G4730 Sleep apnea, unspecified: Secondary | ICD-10-CM | POA: Diagnosis not present

## 2015-03-29 DIAGNOSIS — J40 Bronchitis, not specified as acute or chronic: Secondary | ICD-10-CM

## 2015-03-29 DIAGNOSIS — M1990 Unspecified osteoarthritis, unspecified site: Secondary | ICD-10-CM | POA: Diagnosis not present

## 2015-03-29 DIAGNOSIS — E119 Type 2 diabetes mellitus without complications: Secondary | ICD-10-CM | POA: Insufficient documentation

## 2015-03-29 DIAGNOSIS — I10 Essential (primary) hypertension: Secondary | ICD-10-CM | POA: Insufficient documentation

## 2015-03-29 DIAGNOSIS — Z794 Long term (current) use of insulin: Secondary | ICD-10-CM | POA: Insufficient documentation

## 2015-03-29 DIAGNOSIS — E785 Hyperlipidemia, unspecified: Secondary | ICD-10-CM | POA: Diagnosis not present

## 2015-03-29 DIAGNOSIS — Z87442 Personal history of urinary calculi: Secondary | ICD-10-CM | POA: Diagnosis not present

## 2015-03-29 DIAGNOSIS — Z880 Allergy status to penicillin: Secondary | ICD-10-CM | POA: Diagnosis not present

## 2015-03-29 DIAGNOSIS — Z8701 Personal history of pneumonia (recurrent): Secondary | ICD-10-CM | POA: Diagnosis not present

## 2015-03-29 LAB — CBC WITH DIFFERENTIAL/PLATELET
BASOS PCT: 0 %
Basophils Absolute: 0 10*3/uL (ref 0.0–0.1)
Eosinophils Absolute: 0.1 10*3/uL (ref 0.0–0.7)
Eosinophils Relative: 1 %
HEMATOCRIT: 36.8 % — AB (ref 39.0–52.0)
Hemoglobin: 12.6 g/dL — ABNORMAL LOW (ref 13.0–17.0)
LYMPHS ABS: 1.1 10*3/uL (ref 0.7–4.0)
LYMPHS PCT: 13 %
MCH: 30.4 pg (ref 26.0–34.0)
MCHC: 34.2 g/dL (ref 30.0–36.0)
MCV: 88.9 fL (ref 78.0–100.0)
MONOS PCT: 8 %
Monocytes Absolute: 0.7 10*3/uL (ref 0.1–1.0)
NEUTROS ABS: 6.3 10*3/uL (ref 1.7–7.7)
Neutrophils Relative %: 78 %
PLATELETS: DECREASED 10*3/uL (ref 150–400)
RBC: 4.14 MIL/uL — ABNORMAL LOW (ref 4.22–5.81)
RDW: 14.9 % (ref 11.5–15.5)
WBC: 8.2 10*3/uL (ref 4.0–10.5)

## 2015-03-29 LAB — COMPREHENSIVE METABOLIC PANEL
ALK PHOS: 53 U/L (ref 38–126)
ALT: 39 U/L (ref 17–63)
AST: 51 U/L — AB (ref 15–41)
Albumin: 4.1 g/dL (ref 3.5–5.0)
Anion gap: 17 — ABNORMAL HIGH (ref 5–15)
BUN: 21 mg/dL — AB (ref 6–20)
CALCIUM: 9 mg/dL (ref 8.9–10.3)
CHLORIDE: 95 mmol/L — AB (ref 101–111)
CO2: 20 mmol/L — AB (ref 22–32)
CREATININE: 1.09 mg/dL (ref 0.61–1.24)
Glucose, Bld: 172 mg/dL — ABNORMAL HIGH (ref 65–99)
Potassium: 3.5 mmol/L (ref 3.5–5.1)
Sodium: 132 mmol/L — ABNORMAL LOW (ref 135–145)
Total Bilirubin: 1.4 mg/dL — ABNORMAL HIGH (ref 0.3–1.2)
Total Protein: 6.6 g/dL (ref 6.5–8.1)

## 2015-03-29 LAB — TROPONIN I: Troponin I: 0.03 ng/mL (ref <0.031)

## 2015-03-29 LAB — BRAIN NATRIURETIC PEPTIDE: B Natriuretic Peptide: 71.8 pg/mL (ref 0.0–100.0)

## 2015-03-29 MED ORDER — IOHEXOL 350 MG/ML SOLN
100.0000 mL | Freq: Once | INTRAVENOUS | Status: AC | PRN
  Administered 2015-03-29: 100 mL via INTRAVENOUS

## 2015-03-29 NOTE — ED Provider Notes (Signed)
CSN: [id number]     Arrival date & time 03/29/15  [id number] History   First MD Initiated Contact with Patient 03/29/15 [phone]     Chief Complaint  Patient presents with  . Shortness of Breath     (Consider location/radiation/quality/duration/timing/severity/associated sxs/prior Treatment) HPI Comments: Patient presents to the emergency department for evaluation of difficulty breathing. Patient reports that he has been experiencing upper respiratory infection symptoms for more than a week. He saw his primary doctor and was started on Zithromax, prednisone and albuterol. He reports that approximately 2 hours before coming to the ER he suddenly had worsening with his breathing. He did use his nebulizer treatment at home and did not have any relief.  Patient is a 55 y.o. male presenting with shortness of breath.  Shortness of Breath Associated symptoms: cough     Past Medical History  Diagnosis Date  . Asthma   . COPD (chronic obstructive pulmonary disease) ([address])   . H/O hiatal hernia   . Shortness of breath   . Hypertension   . GERD (gastroesophageal reflux disease)   . Stomach ulcer   . Pneumonia 2014  . Diabetes mellitus     Type 2  . History of kidney stones   . Arthritis   . Hyperlipemia   . Sleep apnea     pt has CPAP but is unable to use it d/t having polyps in his nose. Pt stated "I need to call and get a CPAP mask instead"  . Nasal polyps    Past Surgical History  Procedure Laterality Date  . Knee arthroscopy  03/24/2011    Procedure: ARTHROSCOPY KNEE;  Surgeon: Alta Corning, MD;  Location: [place];  Service: Orthopedics;  Laterality: Right;  partial medial menisectomy, chondroplaty patella, and medial medial plica  . Esophagogastroduodenoscopy    . Colonoscopy w/ polypectomy    . Cholecystectomy N/A 01/23/2015    Procedure: LAPAROSCOPIC CHOLECYSTECTOMY ;  Surgeon: Georganna Skeans, MD;  Location: [place];  Service: General;  Laterality: N/A;   No family  history on file. Social History  Substance Use Topics  . Smoking status: Never Smoker   . Smokeless tobacco: Current User    Types: Chew  . Alcohol Use: Yes     Comment: social, 1x weekly    Review of Systems  Respiratory: Positive for cough and shortness of breath.   All other systems reviewed and are negative.     Allergies  Penicillins  Home Medications   Prior to Admission medications   Medication Sig Start Date End Date Taking? Authorizing Provider  ADVAIR DISKUS 500-50 MCG/DOSE AEPB Inhale 1 puff into the lungs 2 (two) times daily. 01/09/15  Yes Historical Provider, MD  albuterol (PROVENTIL HFA;VENTOLIN HFA) 108 (90 BASE) MCG/ACT inhaler Inhale 1-2 puffs into the lungs every 6 (six) hours as needed for wheezing or shortness of breath.    Yes Historical Provider, MD  albuterol (PROVENTIL) (2.5 MG/3ML) 0.083% nebulizer solution INHALE CONTENTS OF ONE VIAL IN NEBULIZER THREE TIMES DAILY AS NEEDED 03/27/15  Yes Historical Provider, MD  aspirin EC 81 MG tablet Take 81 mg by mouth daily.   Yes Historical Provider, MD  atorvastatin (LIPITOR) 40 MG tablet Take 40 mg by mouth daily.   Yes Historical Provider, MD  azithromycin (ZITHROMAX) 250 MG tablet TAKE TWO TABLETS BY MOUTH ON DAY 1, THEN ONE TABLET EVERY DAY FOR FOUR DAYS 03/27/15  Yes Historical Provider, MD  esomeprazole (NEXIUM) 40 MG capsule Take 40 mg  by mouth daily at 12 noon.   Yes Historical Provider, MD  glimepiride (AMARYL) 4 MG tablet Take 4 mg by mouth 2 (two) times daily.   Yes Historical Provider, MD  HYDROMET 5-1.5 MG/5ML syrup TAKE ONE TEASPOONFUL BY MOUTH EVERY 4 TO 6 HOURS AS NEEDED FOR COUGH 03/27/15  Yes Historical Provider, MD  KOMBIGLYZE XR 2.06-998 MG TB24 Take 2 tablets by mouth at bedtime.  02/25/13  Yes Historical Provider, MD  LANTUS SOLOSTAR 100 UNIT/ML Solostar Pen Inject 42 Units into the skin at bedtime.  01/10/15  Yes Historical Provider, MD  lisinopril-hydrochlorothiazide (PRINZIDE,ZESTORETIC) 20-25 MG  per tablet Take 1 tablet by mouth daily. 02/25/13  Yes Historical Provider, MD  predniSONE (DELTASONE) 10 MG tablet TAKE SIX TABLETS BY MOUTH ON DAY 1 THEN DECREASE BY ONE TABLET EACH DAY FOR SIX DAYS [phone]) 03/27/15  Yes Historical Provider, MD  oxyCODONE (OXY IR/ROXICODONE) 5 MG immediate release tablet Take 1-2 tablets (5-10 mg total) by mouth every 4 (four) hours as needed (5mg  for moderate pain, 10mg  for severe pain). Patient not taking: Reported on 03/29/2015 01/23/15   Georganna Skeans, MD   BP 115/77 mmHg  Pulse 111  Temp(Src) 98.8 F (37.1 C) (Oral)  Resp 21  Ht 5\' 10"  (1.778 m)  Wt 240 lb (108.863 kg)  BMI 34.44 kg/m2  SpO2 95% Physical Exam  Constitutional: He is oriented to person, place, and time. He appears well-developed and well-nourished. No distress.  HENT:  Head: Normocephalic and atraumatic.  Right Ear: Hearing normal.  Left Ear: Hearing normal.  Nose: Nose normal.  Mouth/Throat: Oropharynx is clear and moist and mucous membranes are normal.  Eyes: Conjunctivae and EOM are normal. Pupils are equal, round, and reactive to light.  Neck: Normal range of motion. Neck supple.  Cardiovascular: Regular rhythm, S1 normal and S2 normal.  Exam reveals no gallop and no friction rub.   No murmur heard. Pulmonary/Chest: Effort normal. No respiratory distress. He has decreased breath sounds. He exhibits no tenderness.  Abdominal: Soft. Normal appearance and bowel sounds are normal. There is no hepatosplenomegaly. There is no tenderness. There is no rebound, no guarding, no tenderness at McBurney's point and negative Murphy's sign. No hernia.  Musculoskeletal: Normal range of motion.  Neurological: He is alert and oriented to person, place, and time. He has normal strength. No cranial nerve deficit or sensory deficit. Coordination normal. GCS eye subscore is 4. GCS verbal subscore is 5. GCS motor subscore is 6.  Skin: Skin is warm, dry and intact. No rash noted. No cyanosis.   Psychiatric: He has a normal mood and affect. His speech is normal and behavior is normal. Thought content normal.  Nursing note and vitals reviewed.   ED Course  Procedures (including critical care time) Labs Review Labs Reviewed  COMPREHENSIVE METABOLIC PANEL - Abnormal; Notable for the following:    Sodium 132 (*)    Chloride 95 (*)    CO2 20 (*)    Glucose, Bld 172 (*)    BUN 21 (*)    AST 51 (*)    Total Bilirubin 1.4 (*)    Anion gap 17 (*)    All other components within normal limits  CBC WITH DIFFERENTIAL/PLATELET - Abnormal; Notable for the following:    RBC 4.14 (*)    Hemoglobin 12.6 (*)    HCT 36.8 (*)    All other components within normal limits  TROPONIN I  BRAIN NATRIURETIC PEPTIDE  CBC WITH DIFFERENTIAL/PLATELET  Imaging Review Dg Chest 2 View  03/29/2015  CLINICAL DATA:  Acute onset of shortness of breath and cough. Initial encounter. EXAM: CHEST  2 VIEW COMPARISON:  Chest radiograph performed 12/12/2014 FINDINGS: The lungs are well-aerated. Mild vascular congestion is noted. There is no evidence of focal opacification, pleural effusion or pneumothorax. The heart is normal in size; the mediastinal contour is within normal limits. No acute osseous abnormalities are seen. IMPRESSION: Mild vascular congestion noted.  Lungs remain grossly clear. Electronically Signed   By: Garald Balding M.D.   On: 03/29/2015 05:11   Ct Angio Chest Pe W/cm &/or Wo Cm  03/29/2015  CLINICAL DATA:  Shortness of breath.  Coughing.  COPD. EXAM: CT ANGIOGRAPHY CHEST WITH CONTRAST TECHNIQUE: Multidetector CT imaging of the chest was performed using the standard protocol during bolus administration of intravenous contrast. Multiplanar CT image reconstructions and MIPs were obtained to evaluate the vascular anatomy. CONTRAST:  176mL OMNIPAQUE IOHEXOL 350 MG/ML SOLN COMPARISON:  Multiple exams, including 11/23/2013 and 03/29/2015 FINDINGS: Mediastinum/Nodes: No filling defect is identified in  the pulmonary arterial tree to suggest pulmonary embolus. Coronary atherosclerotic calcification is present along with mild atherosclerotic calcification of the aortic arch. Subcarinal node 1.2 cm in short axis, stable. Mild distal esophageal wall thickening. Lungs/Pleura: Minimal atelectasis in the left lower lobe. 1.3 by 0.8 cm ground-glass density nodule in the apical posterior segment left upper lobe, image 21 series 7. There is also a nearby 1.2 by 0.7 cm ground-glass density nodule in the left upper lobe on image 28 series 7 and some vague ground-glass densities elsewhere in the left upper lobe. 7 mm indistinct ground-glass density in the right lower lobe, image 61 series 7. None of these lesions were visible on the 11/23/2013 exam. Upper abdomen: Cholecystectomy. Musculoskeletal: Unremarkable Review of the MIP images confirms the above findings. IMPRESSION: 1. No filling defect is identified in the pulmonary arterial tree to suggest pulmonary embolus. 2. There are 2 ground-glass densities in the left upper lobe and 1 faint ground-glass density in the right lower lobe. These were not present in October 2015. Although statistically likely to be inflammatory, low-grade adenocarcinoma can occasionally have a similar appearance. Initial follow-up by chest CT without contrast is recommended in 3 months to confirm persistence. This recommendation follows the consensus statement: Recommendations for the Management of Subsolid Pulmonary Nodules Detected at CT: A Statement from the [employer] as published in Radiology 2013; 266:304-317. 3. Mild distal esophageal wall thickening, favoring esophagitis. 4. Coronary atherosclerosis. Electronically Signed   By: Van Clines M.D.   On: 03/29/2015 08:44   I have personally reviewed and evaluated these images and lab results as part of my medical decision-making.   EKG Interpretation   Date/Time:  Saturday March 29 2015 04:18:10 EST Ventricular Rate:   122 PR Interval:  146 QRS Duration: 90 QT Interval:  316 QTC Calculation: 450 R Axis:   46 Text Interpretation:  Sinus tachycardia Atrial premature complex Low  voltage, precordial leads Borderline repolarization abnormality Baseline  wander in lead(s) V3 No significant change since last tracing Confirmed by  Fawne Hughley  MD, [place] [id number]) on 03/29/2015 4:37:46 AM      MDM   Final diagnoses:  Bronchitis    Presents to the ER for evaluation of shortness of breath. Patient reports that he has been sick for more than a week. He saw his primary care doctor for this and was started on Zithromax, prednisone and albuterol. Tonight he became acutely more short of  breath. He uses a nebulizer at home without improvement, but here in the ER has slowly improved. He has mild tachycardia present. This is likely secondary to his shortness of breath and upper respiratory infection, but will obtain CT angiography of chest to rule out PE. Case signed out to oncoming ER physician. Disposition will be determined by CT findings. If CT negative, anticipate discharge and continue treatment initiated by primary care doctor.    Orpah Greek, MD 03/30/15 [phone]

## 2015-03-29 NOTE — ED Notes (Signed)
EKG given to EDP,Pollina,MD., for review. 

## 2015-03-29 NOTE — ED Notes (Signed)
Pt c/o diff breathing worsening tonight, pt being tx for URI by PCP, currently taking Prednisone, Zpack and cough medication. Pt did neb tx and MDI at home with norelief

## 2015-03-29 NOTE — ED Notes (Signed)
Pt states he was drinking about 10 beers earlier/ and then went to sleep. He woke up not able to breathe and was coughing a lot. Has a hx COPD, and chronic bronchitis.

## 2015-03-29 NOTE — ED Notes (Signed)
PT in X-ray.

## 2015-03-29 NOTE — ED Provider Notes (Signed)
CT scan abnormalities reviewed with the patient and his son. He understands to repeat a CT in 3 months. He is hemodynamically stable. He is oxygenating well and feeling better. Continue antibiotic, prednisone, nebulizer.  Nat Christen, MD 03/29/15 1002

## 2015-03-29 NOTE — Discharge Instructions (Signed)
CT scan showed no evidence of a blood clot. However, your chest CT shows some ground glass areas in your left upper lobe and right lower lobe of the lung. Radiologist recommends a repeat CT scan in 3 months. Continue your antibiotic, prednisone, nebulizer treatments.

## 2015-07-03 ENCOUNTER — Other Ambulatory Visit: Payer: Self-pay | Admitting: Family Medicine

## 2015-07-03 DIAGNOSIS — R911 Solitary pulmonary nodule: Secondary | ICD-10-CM

## 2015-07-08 ENCOUNTER — Other Ambulatory Visit: Payer: Self-pay

## 2015-07-10 ENCOUNTER — Other Ambulatory Visit: Payer: Self-pay

## 2015-07-21 ENCOUNTER — Institutional Professional Consult (permissible substitution): Payer: Self-pay | Admitting: Internal Medicine

## 2015-12-08 ENCOUNTER — Ambulatory Visit (HOSPITAL_COMMUNITY)
Admission: EM | Admit: 2015-12-08 | Discharge: 2015-12-08 | Disposition: A | Discharge status: Home / Self Care | Payer: Self-pay | Attending: Family Medicine | Admitting: Family Medicine

## 2015-12-08 ENCOUNTER — Encounter (HOSPITAL_COMMUNITY): Payer: Self-pay | Admitting: Emergency Medicine

## 2015-12-08 DIAGNOSIS — J441 Chronic obstructive pulmonary disease with (acute) exacerbation: Secondary | ICD-10-CM

## 2015-12-08 DIAGNOSIS — J069 Acute upper respiratory infection, unspecified: Secondary | ICD-10-CM

## 2015-12-08 MED ORDER — BENZONATATE 100 MG PO CAPS: 100.0000 mg | ORAL_CAPSULE | Freq: Three times a day (TID) | ORAL | 0 refills | Status: DC

## 2015-12-08 MED ORDER — AZITHROMYCIN 250 MG PO TABS: 250.0000 mg | ORAL_TABLET | Freq: Every day | ORAL | 0 refills | Status: DC

## 2015-12-08 MED ORDER — PREDNISONE 20 MG PO TABS: ORAL_TABLET | ORAL | 0 refills | Status: DC

## 2015-12-08 MED ORDER — METHYLPREDNISOLONE ACETATE 80 MG/ML IJ SUSP
80.0000 mg | Freq: Once | INTRAMUSCULAR | Status: AC
  Administered 2015-12-08: 80 mg via INTRAMUSCULAR

## 2015-12-08 MED ORDER — METHYLPREDNISOLONE ACETATE 80 MG/ML IJ SUSP
INTRAMUSCULAR | Status: AC
  Filled 2015-12-08: qty 1

## 2015-12-08 NOTE — Discharge Instructions (Signed)
You were given a shot of depo-medrol (a steroid) today to help with inflammation in your airway, to help you breath better.  You have been prescribed prednisone, an oral steroid.  You may start this medication tomorrow with breakfast.

## 2015-12-08 NOTE — ED Triage Notes (Signed)
Pt. Stated, I've had bronchitis going on 3 weeks now, and I don't want it to turn into pneumonia.

## 2015-12-08 NOTE — ED Provider Notes (Signed)
CSN: [id number]     Arrival date & time 12/08/15  1913 History   First MD Initiated Contact with Patient 12/08/15 2005     Chief Complaint  Patient presents with  . Bronchitis   (Consider location/radiation/quality/duration/timing/severity/associated sxs/prior Treatment) HPI  Juan [name] is a 55 y.o. male presenting to UC with c/o 3 weeks of gradually worsening productive cough.  Hx of asthma and COPD. He has been taking mucinex and needing to use his albuterol more often with minimal relief. He is coughing up white phlegm.  Denies fever, chills, n/v/d. His son has also been sick with similar symptoms.    Past Medical History:  Diagnosis Date  . Arthritis   . Asthma   . COPD (chronic obstructive pulmonary disease) ([place])   . Diabetes mellitus    Type 2  . GERD (gastroesophageal reflux disease)   . H/O hiatal hernia   . History of kidney stones   . Hyperlipemia   . Hypertension   . Nasal polyps   . Pneumonia 2014  . Shortness of breath   . Sleep apnea    pt has CPAP but is unable to use it d/t having polyps in his nose. Pt stated "I need to call and get a CPAP mask instead"  . Stomach ulcer    Past Surgical History:  Procedure Laterality Date  . CHOLECYSTECTOMY N/A 01/23/2015   Procedure: LAPAROSCOPIC CHOLECYSTECTOMY ;  Surgeon: Georganna Skeans, MD;  Location: [place];  Service: General;  Laterality: N/A;  . COLONOSCOPY W/ POLYPECTOMY    . ESOPHAGOGASTRODUODENOSCOPY    . KNEE ARTHROSCOPY  03/24/2011   Procedure: ARTHROSCOPY KNEE;  Surgeon: Alta Corning, MD;  Location: [place];  Service: Orthopedics;  Laterality: Right;  partial medial menisectomy, chondroplaty patella, and medial medial plica   No family history on file. Social History  Substance Use Topics  . Smoking status: Never Smoker  . Smokeless tobacco: Current User    Types: Chew  . Alcohol use Yes     Comment: social, 1x weekly    Review of Systems  Constitutional: Negative for chills and  fever.  HENT: Positive for congestion and rhinorrhea. Negative for ear pain, sore throat, trouble swallowing and voice change.   Respiratory: Positive for cough, chest tightness and wheezing. Negative for shortness of breath.   Cardiovascular: Negative for chest pain and palpitations.  Gastrointestinal: Negative for abdominal pain, diarrhea, nausea and vomiting.  Musculoskeletal: Negative for arthralgias, back pain and myalgias.  Skin: Negative for rash.    Allergies  Penicillins  Home Medications   Prior to Admission medications   Medication Sig Start Date End Date Taking? Authorizing Provider  ADVAIR DISKUS 500-50 MCG/DOSE AEPB Inhale 1 puff into the lungs 2 (two) times daily. 01/09/15   Historical Provider, MD  albuterol (PROVENTIL HFA;VENTOLIN HFA) 108 (90 BASE) MCG/ACT inhaler Inhale 1-2 puffs into the lungs every 6 (six) hours as needed for wheezing or shortness of breath.     Historical Provider, MD  albuterol (PROVENTIL) (2.5 MG/3ML) 0.083% nebulizer solution INHALE CONTENTS OF ONE VIAL IN NEBULIZER THREE TIMES DAILY AS NEEDED 03/27/15   Historical Provider, MD  aspirin EC 81 MG tablet Take 81 mg by mouth daily.    Historical Provider, MD  atorvastatin (LIPITOR) 40 MG tablet Take 40 mg by mouth daily.    Historical Provider, MD  azithromycin (ZITHROMAX) 250 MG tablet Take 1 tablet (250 mg total) by mouth daily. Take first 2 tablets together, then 1  every day until finished. 12/08/15   Noland Fordyce, PA-C  benzonatate (TESSALON) 100 MG capsule Take 1 capsule (100 mg total) by mouth every 8 (eight) hours. 12/08/15   Noland Fordyce, PA-C  esomeprazole (NEXIUM) 40 MG capsule Take 40 mg by mouth daily at 12 noon.    Historical Provider, MD  glimepiride (AMARYL) 4 MG tablet Take 4 mg by mouth 2 (two) times daily.    Historical Provider, MD  HYDROMET 5-1.5 MG/5ML syrup TAKE ONE TEASPOONFUL BY MOUTH EVERY 4 TO 6 HOURS AS NEEDED FOR COUGH 03/27/15   Historical Provider, MD  KOMBIGLYZE XR 2.06-998  MG TB24 Take 2 tablets by mouth at bedtime.  02/25/13   Historical Provider, MD  LANTUS SOLOSTAR 100 UNIT/ML Solostar Pen Inject 42 Units into the skin at bedtime.  01/10/15   Historical Provider, MD  lisinopril-hydrochlorothiazide (PRINZIDE,ZESTORETIC) 20-25 MG per tablet Take 1 tablet by mouth daily. 02/25/13   Historical Provider, MD  oxyCODONE (OXY IR/ROXICODONE) 5 MG immediate release tablet Take 1-2 tablets (5-10 mg total) by mouth every 4 (four) hours as needed (5mg  for moderate pain, 10mg  for severe pain). Patient not taking: Reported on 03/29/2015 01/23/15   Georganna Skeans, MD  predniSONE (DELTASONE) 20 MG tablet 3 tabs po day one, then 2 po daily x 4 days 12/08/15   Noland Fordyce, PA-C   Meds Ordered and Administered this Visit   Medications  methylPREDNISolone acetate (DEPO-MEDROL) injection 80 mg (80 mg Intramuscular Given 12/08/15 2028)    BP 142/85 (BP Location: Left Arm)   Pulse 103   Temp 98.3 F (36.8 C) (Oral)   Resp 17   Ht 5\' 10"  (1.778 m)   Wt 260 lb (117.9 kg)   SpO2 99%   BMI 37.31 kg/m  No data found.   Physical Exam  Constitutional: He is oriented to person, place, and time. He appears well-developed and well-nourished. No distress.  HENT:  Head: Normocephalic and atraumatic.  Right Ear: Tympanic membrane normal.  Left Ear: Tympanic membrane normal.  Nose: Mucosal edema present.  Mouth/Throat: Uvula is midline, oropharynx is clear and moist and mucous membranes are normal.  Eyes: EOM are normal.  Neck: Normal range of motion. Neck supple.  Cardiovascular: Normal rate.   Pulmonary/Chest: Effort normal. He has decreased breath sounds in the right lower field and the left lower field. He has wheezes. He has rhonchi. He has no rales.  Faint diffuse wheeze and rhonchi with decreased breath sounds in lower lung fields. No respiratory distress. No accessory muscle use.  Musculoskeletal: Normal range of motion.  Neurological: He is alert and oriented to person,  place, and time.  Skin: Skin is warm and dry. He is not diaphoretic.  Psychiatric: He has a normal mood and affect. His behavior is normal.  Nursing note and vitals reviewed.   Urgent Care Course   Clinical Course     Procedures (including critical care time)  Labs Review Labs Reviewed - No data to display  Imaging Review No results found.    MDM   1. COPD exacerbation (HCC)   2. Upper respiratory tract infection, unspecified type     Pt c/o 3 weeks of productive cough and chest tightness. Pt concerned he may be developing pneumonia.  Tx in UC: Depomedrol 80mg  IM Pt declined breathing treatment as he has a nebulizer machine at home.  Rx: Azithromycin, prednisone, and tessalon Encouraged to f/u with PCP in 1 week if not improving, sooner if worsening.    Junie Panning  Hilda Blades, PA-C 12/08/15 2050

## 2017-01-28 ENCOUNTER — Encounter (HOSPITAL_COMMUNITY): Payer: Self-pay | Admitting: Emergency Medicine

## 2017-01-28 ENCOUNTER — Other Ambulatory Visit: Payer: Self-pay

## 2017-01-28 ENCOUNTER — Emergency Department (HOSPITAL_COMMUNITY)
Admission: EM | Admit: 2017-01-28 | Discharge: 2017-01-28 | Disposition: A | Discharge status: Home / Self Care | Payer: Managed Care, Other (non HMO) | Attending: Emergency Medicine | Admitting: Emergency Medicine

## 2017-01-28 DIAGNOSIS — R110 Nausea: Secondary | ICD-10-CM | POA: Insufficient documentation

## 2017-01-28 DIAGNOSIS — R109 Unspecified abdominal pain: Secondary | ICD-10-CM | POA: Insufficient documentation

## 2017-01-28 DIAGNOSIS — R197 Diarrhea, unspecified: Secondary | ICD-10-CM | POA: Insufficient documentation

## 2017-01-28 DIAGNOSIS — Z5321 Procedure and treatment not carried out due to patient leaving prior to being seen by health care provider: Secondary | ICD-10-CM | POA: Insufficient documentation

## 2017-01-28 LAB — COMPREHENSIVE METABOLIC PANEL
ALBUMIN: 3.7 g/dL (ref 3.5–5.0)
ALK PHOS: 69 U/L (ref 38–126)
ALT: 49 U/L (ref 17–63)
AST: 41 U/L (ref 15–41)
Anion gap: 14 (ref 5–15)
BILIRUBIN TOTAL: 1.6 mg/dL — AB (ref 0.3–1.2)
BUN: 14 mg/dL (ref 6–20)
CALCIUM: 9.2 mg/dL (ref 8.9–10.3)
CO2: 25 mmol/L (ref 22–32)
CREATININE: 1.07 mg/dL (ref 0.61–1.24)
Chloride: 94 mmol/L — ABNORMAL LOW (ref 101–111)
GFR calc Af Amer: >60 mL/min (ref >60)
GFR calc non Af Amer: >60 mL/min (ref >60)
GLUCOSE: 267 mg/dL — AB (ref 65–99)
Potassium: 4.2 mmol/L (ref 3.5–5.1)
SODIUM: 133 mmol/L — AB (ref 135–145)
TOTAL PROTEIN: 6.9 g/dL (ref 6.5–8.1)

## 2017-01-28 LAB — CBC
HCT: 41.4 % (ref 39.0–52.0)
HEMOGLOBIN: 14.3 g/dL (ref 13.0–17.0)
MCH: 32.3 pg (ref 26.0–34.0)
MCHC: 34.5 g/dL (ref 30.0–36.0)
MCV: 93.5 fL (ref 78.0–100.0)
PLATELETS: 168 10*3/uL (ref 150–400)
RBC: 4.43 MIL/uL (ref 4.22–5.81)
RDW: 14 % (ref 11.5–15.5)
WBC: 8.9 10*3/uL (ref 4.0–10.5)

## 2017-01-28 LAB — LIPASE, BLOOD: Lipase: 155 U/L — ABNORMAL HIGH (ref 11–51)

## 2017-01-28 NOTE — ED Triage Notes (Signed)
Pt states for the past 3 weeks he has been having mid abd pain right has his umbilicus with nausea and diarrhea. Pt states he has been having 3-4 episodes of diarrhea a day. Pt states for the last 24 hours he hasn't had diarrhea but feels unable to have BM. Pt states pain is worse with eating.

## 2017-01-28 NOTE — ED Notes (Signed)
Called for Pt x's 4 to recheck vitals. No answer

## 2017-02-10 DIAGNOSIS — Z23 Encounter for immunization: Secondary | ICD-10-CM | POA: Diagnosis not present

## 2017-02-10 DIAGNOSIS — Z125 Encounter for screening for malignant neoplasm of prostate: Secondary | ICD-10-CM | POA: Diagnosis not present

## 2017-02-10 DIAGNOSIS — E785 Hyperlipidemia, unspecified: Secondary | ICD-10-CM | POA: Diagnosis not present

## 2017-02-10 DIAGNOSIS — I10 Essential (primary) hypertension: Secondary | ICD-10-CM | POA: Diagnosis not present

## 2017-02-10 DIAGNOSIS — E119 Type 2 diabetes mellitus without complications: Secondary | ICD-10-CM | POA: Diagnosis not present

## 2017-03-03 DIAGNOSIS — Z23 Encounter for immunization: Secondary | ICD-10-CM | POA: Diagnosis not present

## 2017-03-03 DIAGNOSIS — Z0000 Encounter for general adult medical examination without abnormal findings: Secondary | ICD-10-CM | POA: Diagnosis not present

## 2017-03-14 DIAGNOSIS — J0100 Acute maxillary sinusitis, unspecified: Secondary | ICD-10-CM | POA: Diagnosis not present

## 2017-03-14 DIAGNOSIS — J069 Acute upper respiratory infection, unspecified: Secondary | ICD-10-CM | POA: Diagnosis not present

## 2017-03-18 DIAGNOSIS — K219 Gastro-esophageal reflux disease without esophagitis: Secondary | ICD-10-CM | POA: Diagnosis not present

## 2017-03-18 DIAGNOSIS — R197 Diarrhea, unspecified: Secondary | ICD-10-CM | POA: Diagnosis not present

## 2017-03-18 DIAGNOSIS — R194 Change in bowel habit: Secondary | ICD-10-CM | POA: Diagnosis not present

## 2017-03-18 DIAGNOSIS — Z86010 Personal history of colonic polyps: Secondary | ICD-10-CM | POA: Diagnosis not present

## 2017-03-18 DIAGNOSIS — R935 Abnormal findings on diagnostic imaging of other abdominal regions, including retroperitoneum: Secondary | ICD-10-CM | POA: Diagnosis not present

## 2017-04-01 DIAGNOSIS — R194 Change in bowel habit: Secondary | ICD-10-CM | POA: Diagnosis not present

## 2017-04-01 DIAGNOSIS — K640 First degree hemorrhoids: Secondary | ICD-10-CM | POA: Diagnosis not present

## 2017-04-01 DIAGNOSIS — Z86010 Personal history of colonic polyps: Secondary | ICD-10-CM | POA: Diagnosis not present

## 2017-04-01 DIAGNOSIS — D126 Benign neoplasm of colon, unspecified: Secondary | ICD-10-CM | POA: Diagnosis not present

## 2017-04-01 DIAGNOSIS — R12 Heartburn: Secondary | ICD-10-CM | POA: Diagnosis not present

## 2017-04-01 DIAGNOSIS — R197 Diarrhea, unspecified: Secondary | ICD-10-CM | POA: Diagnosis not present

## 2017-04-01 DIAGNOSIS — K219 Gastro-esophageal reflux disease without esophagitis: Secondary | ICD-10-CM | POA: Diagnosis not present

## 2017-04-05 DIAGNOSIS — D126 Benign neoplasm of colon, unspecified: Secondary | ICD-10-CM | POA: Diagnosis not present

## 2017-04-28 DIAGNOSIS — E1165 Type 2 diabetes mellitus with hyperglycemia: Secondary | ICD-10-CM | POA: Diagnosis not present

## 2017-04-28 DIAGNOSIS — E1159 Type 2 diabetes mellitus with other circulatory complications: Secondary | ICD-10-CM | POA: Diagnosis not present

## 2017-04-28 DIAGNOSIS — E1169 Type 2 diabetes mellitus with other specified complication: Secondary | ICD-10-CM | POA: Diagnosis not present

## 2017-04-28 DIAGNOSIS — E1140 Type 2 diabetes mellitus with diabetic neuropathy, unspecified: Secondary | ICD-10-CM | POA: Diagnosis not present

## 2017-04-28 DIAGNOSIS — Z794 Long term (current) use of insulin: Secondary | ICD-10-CM | POA: Diagnosis not present

## 2017-04-28 DIAGNOSIS — E1129 Type 2 diabetes mellitus with other diabetic kidney complication: Secondary | ICD-10-CM | POA: Diagnosis not present

## 2017-07-22 DIAGNOSIS — M542 Cervicalgia: Secondary | ICD-10-CM | POA: Diagnosis not present

## 2017-07-22 DIAGNOSIS — J449 Chronic obstructive pulmonary disease, unspecified: Secondary | ICD-10-CM | POA: Diagnosis not present

## 2017-07-22 DIAGNOSIS — E109 Type 1 diabetes mellitus without complications: Secondary | ICD-10-CM | POA: Diagnosis not present

## 2017-07-22 DIAGNOSIS — M47812 Spondylosis without myelopathy or radiculopathy, cervical region: Secondary | ICD-10-CM | POA: Diagnosis not present

## 2017-09-09 DIAGNOSIS — J449 Chronic obstructive pulmonary disease, unspecified: Secondary | ICD-10-CM | POA: Diagnosis not present

## 2017-09-09 DIAGNOSIS — E109 Type 1 diabetes mellitus without complications: Secondary | ICD-10-CM | POA: Diagnosis not present

## 2017-09-09 DIAGNOSIS — M47812 Spondylosis without myelopathy or radiculopathy, cervical region: Secondary | ICD-10-CM | POA: Diagnosis not present

## 2017-09-09 DIAGNOSIS — M542 Cervicalgia: Secondary | ICD-10-CM | POA: Diagnosis not present

## 2017-11-03 DIAGNOSIS — E1165 Type 2 diabetes mellitus with hyperglycemia: Secondary | ICD-10-CM | POA: Diagnosis not present

## 2017-11-03 DIAGNOSIS — E1159 Type 2 diabetes mellitus with other circulatory complications: Secondary | ICD-10-CM | POA: Diagnosis not present

## 2017-11-03 DIAGNOSIS — E1169 Type 2 diabetes mellitus with other specified complication: Secondary | ICD-10-CM | POA: Diagnosis not present

## 2017-11-03 DIAGNOSIS — E1140 Type 2 diabetes mellitus with diabetic neuropathy, unspecified: Secondary | ICD-10-CM | POA: Diagnosis not present

## 2017-11-03 DIAGNOSIS — Z794 Long term (current) use of insulin: Secondary | ICD-10-CM | POA: Diagnosis not present

## 2018-03-03 DIAGNOSIS — Z79899 Other long term (current) drug therapy: Secondary | ICD-10-CM | POA: Diagnosis not present

## 2018-03-03 DIAGNOSIS — Z0000 Encounter for general adult medical examination without abnormal findings: Secondary | ICD-10-CM | POA: Diagnosis not present

## 2018-03-03 DIAGNOSIS — E785 Hyperlipidemia, unspecified: Secondary | ICD-10-CM | POA: Diagnosis not present

## 2018-03-03 DIAGNOSIS — Z23 Encounter for immunization: Secondary | ICD-10-CM | POA: Diagnosis not present

## 2018-03-03 DIAGNOSIS — Z125 Encounter for screening for malignant neoplasm of prostate: Secondary | ICD-10-CM | POA: Diagnosis not present

## 2018-03-03 DIAGNOSIS — E1169 Type 2 diabetes mellitus with other specified complication: Secondary | ICD-10-CM | POA: Diagnosis not present

## 2018-03-03 DIAGNOSIS — K219 Gastro-esophageal reflux disease without esophagitis: Secondary | ICD-10-CM | POA: Diagnosis not present

## 2018-03-03 DIAGNOSIS — I10 Essential (primary) hypertension: Secondary | ICD-10-CM | POA: Diagnosis not present

## 2018-03-08 ENCOUNTER — Other Ambulatory Visit: Payer: Self-pay | Admitting: Family Medicine

## 2018-03-08 DIAGNOSIS — R918 Other nonspecific abnormal finding of lung field: Secondary | ICD-10-CM

## 2018-03-17 ENCOUNTER — Other Ambulatory Visit: Payer: Self-pay

## 2018-03-20 DIAGNOSIS — H6123 Impacted cerumen, bilateral: Secondary | ICD-10-CM | POA: Diagnosis not present

## 2018-03-20 DIAGNOSIS — J441 Chronic obstructive pulmonary disease with (acute) exacerbation: Secondary | ICD-10-CM | POA: Diagnosis not present

## 2018-04-14 DIAGNOSIS — N3941 Urge incontinence: Secondary | ICD-10-CM | POA: Diagnosis not present

## 2018-04-14 DIAGNOSIS — R351 Nocturia: Secondary | ICD-10-CM | POA: Diagnosis not present

## 2018-04-21 DIAGNOSIS — N3941 Urge incontinence: Secondary | ICD-10-CM | POA: Diagnosis not present

## 2018-04-21 DIAGNOSIS — R351 Nocturia: Secondary | ICD-10-CM | POA: Diagnosis not present

## 2018-05-09 DIAGNOSIS — E1159 Type 2 diabetes mellitus with other circulatory complications: Secondary | ICD-10-CM | POA: Diagnosis not present

## 2018-05-09 DIAGNOSIS — Z794 Long term (current) use of insulin: Secondary | ICD-10-CM | POA: Diagnosis not present

## 2018-05-09 DIAGNOSIS — E1140 Type 2 diabetes mellitus with diabetic neuropathy, unspecified: Secondary | ICD-10-CM | POA: Diagnosis not present

## 2018-05-09 DIAGNOSIS — E1169 Type 2 diabetes mellitus with other specified complication: Secondary | ICD-10-CM | POA: Diagnosis not present

## 2018-06-07 DIAGNOSIS — Z794 Long term (current) use of insulin: Secondary | ICD-10-CM | POA: Diagnosis not present

## 2018-06-07 DIAGNOSIS — H25013 Cortical age-related cataract, bilateral: Secondary | ICD-10-CM | POA: Diagnosis not present

## 2018-06-07 DIAGNOSIS — E119 Type 2 diabetes mellitus without complications: Secondary | ICD-10-CM | POA: Diagnosis not present

## 2018-06-07 DIAGNOSIS — H2513 Age-related nuclear cataract, bilateral: Secondary | ICD-10-CM | POA: Diagnosis not present

## 2018-06-07 DIAGNOSIS — H524 Presbyopia: Secondary | ICD-10-CM | POA: Diagnosis not present

## 2018-07-05 DIAGNOSIS — H25012 Cortical age-related cataract, left eye: Secondary | ICD-10-CM | POA: Diagnosis not present

## 2018-07-05 DIAGNOSIS — H2511 Age-related nuclear cataract, right eye: Secondary | ICD-10-CM | POA: Diagnosis not present

## 2018-07-05 DIAGNOSIS — H25011 Cortical age-related cataract, right eye: Secondary | ICD-10-CM | POA: Diagnosis not present

## 2018-07-05 DIAGNOSIS — H2512 Age-related nuclear cataract, left eye: Secondary | ICD-10-CM | POA: Diagnosis not present

## 2018-07-05 HISTORY — PX: CATARACT EXTRACTION: SUR2

## 2018-07-06 DIAGNOSIS — B957 Other staphylococcus as the cause of diseases classified elsewhere: Secondary | ICD-10-CM | POA: Diagnosis not present

## 2018-07-06 DIAGNOSIS — N390 Urinary tract infection, site not specified: Secondary | ICD-10-CM | POA: Diagnosis not present

## 2018-07-06 DIAGNOSIS — R351 Nocturia: Secondary | ICD-10-CM | POA: Diagnosis not present

## 2018-07-06 DIAGNOSIS — N401 Enlarged prostate with lower urinary tract symptoms: Secondary | ICD-10-CM | POA: Diagnosis not present

## 2018-07-12 DIAGNOSIS — H25012 Cortical age-related cataract, left eye: Secondary | ICD-10-CM | POA: Diagnosis not present

## 2018-07-12 DIAGNOSIS — H2512 Age-related nuclear cataract, left eye: Secondary | ICD-10-CM | POA: Diagnosis not present

## 2018-07-12 HISTORY — PX: CATARACT EXTRACTION: SUR2

## 2018-07-14 DIAGNOSIS — I10 Essential (primary) hypertension: Secondary | ICD-10-CM | POA: Diagnosis not present

## 2018-07-14 DIAGNOSIS — J449 Chronic obstructive pulmonary disease, unspecified: Secondary | ICD-10-CM | POA: Diagnosis not present

## 2018-07-14 DIAGNOSIS — E1169 Type 2 diabetes mellitus with other specified complication: Secondary | ICD-10-CM | POA: Diagnosis not present

## 2018-07-14 DIAGNOSIS — E785 Hyperlipidemia, unspecified: Secondary | ICD-10-CM | POA: Diagnosis not present

## 2018-07-28 ENCOUNTER — Ambulatory Visit (INDEPENDENT_AMBULATORY_CARE_PROVIDER_SITE_OTHER): Payer: BC Managed Care – PPO | Admitting: Ophthalmology

## 2018-07-28 ENCOUNTER — Other Ambulatory Visit: Payer: Self-pay

## 2018-07-28 ENCOUNTER — Encounter (INDEPENDENT_AMBULATORY_CARE_PROVIDER_SITE_OTHER): Payer: Self-pay | Admitting: Ophthalmology

## 2018-07-28 DIAGNOSIS — E113413 Type 2 diabetes mellitus with severe nonproliferative diabetic retinopathy with macular edema, bilateral: Secondary | ICD-10-CM

## 2018-07-28 DIAGNOSIS — I10 Essential (primary) hypertension: Secondary | ICD-10-CM

## 2018-07-28 DIAGNOSIS — H35033 Hypertensive retinopathy, bilateral: Secondary | ICD-10-CM | POA: Diagnosis not present

## 2018-07-28 DIAGNOSIS — H3581 Retinal edema: Secondary | ICD-10-CM

## 2018-07-28 DIAGNOSIS — D3132 Benign neoplasm of left choroid: Secondary | ICD-10-CM

## 2018-07-28 DIAGNOSIS — Z961 Presence of intraocular lens: Secondary | ICD-10-CM

## 2018-07-28 MED ORDER — BEVACIZUMAB CHEMO INJECTION 1.25MG/0.05ML SYRINGE FOR KALEIDOSCOPE
1.2500 mg | INTRAVITREAL | Status: AC | PRN
  Administered 2018-07-28: 1.25 mg via INTRAVITREAL

## 2018-07-28 MED ORDER — KETOROLAC TROMETHAMINE 0.5 % OP SOLN: 1.0000 [drp] | Freq: Four times a day (QID) | OPHTHALMIC | 0 refills | Status: DC

## 2018-07-28 MED ORDER — PREDNISOLONE ACETATE 1 % OP SUSP: 1.0000 [drp] | Freq: Four times a day (QID) | OPHTHALMIC | 0 refills | Status: DC

## 2018-07-28 NOTE — Progress Notes (Addendum)
[facility] Note  07/28/2018     CHIEF COMPLAINT Patient presents for Retina Evaluation   HISTORY OF PRESENT ILLNESS: Juan [name] is a 58 y.o. male who presents to the clinic today for:   HPI    Retina Evaluation    In both eyes.  This started 2 weeks ago.  Duration of 2 weeks.  Associated Symptoms Negative for Flashes, Blind Spot, Photophobia, Scalp Tenderness, Fever, Floaters, Pain, Glare, Jaw Claudication, Weight Loss, Distortion, Redness, Trauma, Shoulder/Hip pain and Fatigue.  Context:  distance vision, mid-range vision, near vision, reading, watching TV, driving and computer work.  Treatments tried include eye drops.  Response to treatment was no improvement.  I, the attending physician,  performed the HPI with the patient and updated documentation appropriately.          Comments    Patient states had CE with IOL OD on 06.03.20 and OS 06.10.20 with Dr. Gershon Crane. Vision gradually getting worse since surgery. Vision worse OS than in OD. Patient diabetic for 17 years (IDDM), BS was 110 this am. Last a1c was under 6, checked last year. Patient taking Pred Forte and Ocuflox three times daily in both eyes.        Last edited by Bernarda Caffey, MD on 07/29/2018  5:11 PM. (History)    pt states his diabetes was out of control for several years, he states he drank heavily and smoked and was very overweight, he states he has lost a lot of weight, stopped drinking and smoking, but still dips, pt states he has pancreatitis and was told by his dr that if he wanted to live a couple more years he needed to make some major lifestyle changes  Referring physician: Rutherford Guys, MD [place],  [place] [id number]  HISTORICAL INFORMATION:   Selected notes from the MEDICAL RECORD NUMBER Referred by Dr. Rutherford Guys for concern of CME s/p cataract sx LEE:  Ocular Hx- PMH-   CURRENT MEDICATIONS: Current Outpatient Medications (Ophthalmic Drugs)   Medication Sig  . ofloxacin (OCUFLOX) 0.3 % ophthalmic solution Place 1 drop into both eyes 3 (three) times daily.  . prednisoLONE acetate (PRED FORTE) 1 % ophthalmic suspension Place 1 drop into both eyes 4 (four) times daily.  Marland Kitchen ketorolac (ACULAR) 0.5 % ophthalmic solution Place 1 drop into the left eye 4 (four) times daily.   No current facility-administered medications for this visit.  (Ophthalmic Drugs)   Current Outpatient Medications (Other)  Medication Sig  . ADVAIR DISKUS 500-50 MCG/DOSE AEPB Inhale 1 puff into the lungs 2 (two) times daily.  Marland Kitchen albuterol (PROVENTIL HFA;VENTOLIN HFA) 108 (90 BASE) MCG/ACT inhaler Inhale 1-2 puffs into the lungs every 6 (six) hours as needed for wheezing or shortness of breath.   Marland Kitchen aspirin EC 81 MG tablet Take 81 mg by mouth daily.  Marland Kitchen atorvastatin (LIPITOR) 40 MG tablet Take 40 mg by mouth daily.  Marland Kitchen atorvastatin (LIPITOR) 40 MG tablet Take 40 mg by mouth daily.  Marland Kitchen azithromycin (ZITHROMAX) 250 MG tablet Take 1 tablet (250 mg total) by mouth daily. Take first 2 tablets together, then 1 every day until finished.  . Empagliflozin-metFORMIN HCl (SYNJARDY) 12.06-998 MG TABS Take by mouth 2 (two) times a day.  . esomeprazole (NEXIUM) 40 MG capsule Take 40 mg by mouth daily at 12 noon.  Marland Kitchen glimepiride (AMARYL) 4 MG tablet Take 4 mg by mouth 2 (two) times daily.  Marland Kitchen LANTUS SOLOSTAR 100 UNIT/ML  Solostar Pen Inject 42 Units into the skin at bedtime.   Marland Kitchen lisinopril (ZESTRIL) 40 MG tablet Take 40 mg by mouth daily.  Marland Kitchen lisinopril-hydrochlorothiazide (PRINZIDE,ZESTORETIC) 20-25 MG per tablet Take 1 tablet by mouth daily.  . Multiple Vitamins-Minerals (CENTRUM SILVER 50+MEN PO) as directed.  Marland Kitchen albuterol (PROVENTIL) (2.5 MG/3ML) 0.083% nebulizer solution INHALE CONTENTS OF ONE VIAL IN NEBULIZER THREE TIMES DAILY AS NEEDED  . benzonatate (TESSALON) 100 MG capsule Take 1 capsule (100 mg total) by mouth every 8 (eight) hours. (Patient not taking: Reported on 07/28/2018)  .  HYDROMET 5-1.5 MG/5ML syrup TAKE ONE TEASPOONFUL BY MOUTH EVERY 4 TO 6 HOURS AS NEEDED FOR COUGH  . KOMBIGLYZE XR 2.06-998 MG TB24 Take 2 tablets by mouth at bedtime.   Marland Kitchen oxyCODONE (OXY IR/ROXICODONE) 5 MG immediate release tablet Take 1-2 tablets (5-10 mg total) by mouth every 4 (four) hours as needed (5mg  for moderate pain, 10mg  for severe pain). (Patient not taking: Reported on 03/29/2015)  . predniSONE (DELTASONE) 20 MG tablet 3 tabs po day one, then 2 po daily x 4 days (Patient not taking: Reported on 07/28/2018)   No current facility-administered medications for this visit.  (Other)      REVIEW OF SYSTEMS: ROS    Positive for: Endocrine, Eyes, Respiratory   Negative for: Constitutional, Gastrointestinal, Neurological, Skin, Genitourinary, Musculoskeletal, HENT, Cardiovascular, Psychiatric, Allergic/Imm, Heme/Lymph   Last edited by Bernarda Caffey, MD on 07/29/2018  5:11 PM. (History)       ALLERGIES Allergies  Allergen Reactions  . Naproxen Sodium Shortness Of Breath  . Penicillins Rash    Has patient had a PCN reaction causing immediate rash, facial/tongue/throat swelling, SOB or lightheadedness with hypotension: Yes Has patient had a PCN reaction causing severe rash involving mucus membranes or skin necrosis: No Has patient had a PCN reaction that required hospitalization No Has patient had a PCN reaction occurring within the last 10 years: No If all of the above answers are "NO", then may proceed with Cephalosporin use.     PAST MEDICAL HISTORY Past Medical History:  Diagnosis Date  . Arthritis   . Asthma   . COPD (chronic obstructive pulmonary disease) ([place])   . Diabetes mellitus    Type 2  . GERD (gastroesophageal reflux disease)   . H/O hiatal hernia   . History of kidney stones   . Hyperlipemia   . Hypertension   . Nasal polyps   . Pneumonia 2014  . Shortness of breath   . Sleep apnea    pt has CPAP but is unable to use it d/t having polyps in his nose. Pt  stated "I need to call and get a CPAP mask instead"  . Stomach ulcer    Past Surgical History:  Procedure Laterality Date  . CATARACT EXTRACTION Right 07/05/2018   Dr. Gershon Crane  . CATARACT EXTRACTION Left 07/12/2018   Dr. Gershon Crane  . CHOLECYSTECTOMY N/A 01/23/2015   Procedure: LAPAROSCOPIC CHOLECYSTECTOMY ;  Surgeon: Georganna Skeans, MD;  Location: [facility];  Service: General;  Laterality: N/A;  . COLONOSCOPY W/ POLYPECTOMY    . ESOPHAGOGASTRODUODENOSCOPY    . KNEE ARTHROSCOPY  03/24/2011   Procedure: ARTHROSCOPY KNEE;  Surgeon: Alta Corning, MD;  Location: [facility];  Service: Orthopedics;  Laterality: Right;  partial medial menisectomy, chondroplaty patella, and medial medial plica    FAMILY HISTORY Family History  Problem Relation Age of Onset  . Diabetes Father     SOCIAL HISTORY Social History  Tobacco Use  . Smoking status: Never Smoker  . Smokeless tobacco: Current User    Types: Chew  Substance Use Topics  . Alcohol use: Yes    Comment: social, 1x weekly  . Drug use: No         OPHTHALMIC EXAM:  Base Eye Exam    Visual Acuity (Snellen - Linear)      Right Left   Dist [place] 20/200 20/400   Dist ph [place] 20/150 NI       Tonometry (Tonopen, 10:10 AM)      Right Left   Pressure 09 14       Pupils      Dark Light Shape React APD   Right 8 8 Round Minimal None   Left 8 8 Round Minimal None       Visual Fields (Counting fingers)      Left Right    Full Full       Extraocular Movement      Right Left    Full, Ortho Full, Ortho       Neuro/Psych    Oriented x3: Yes   Mood/Affect: Normal       Dilation    Both eyes: 1.0% Mydriacyl, 2.5% Phenylephrine @ 10:10 AM        Slit Lamp and Fundus Exam    Slit Lamp Exam      Right Left   Lids/Lashes Dermatochalasis - upper lid, Telangiectasia, mild Meibomian gland dysfunction Dermatochalasis - upper lid, Telangiectasia, mild Meibomian gland dysfunction   Conjunctiva/Sclera White and quiet  Mild nasal Pinguecula   Cornea Mild Arcus, Well healed temporal cataract wounds Arcus, 1+ Descemet's folds, Well healed temporal cataract wounds   Anterior Chamber Deep and quiet Deep and quiet   Iris Round and dilated, mild patch of atrophy at 0800, no NVI Round and dilated, No NVI   Lens PC IOL in good position PC IOL in good position   Vitreous Vitreous syneresis Vitreous syneresis       Fundus Exam      Right Left   Disc Pink and Sharp Pink and Sharp   C/D Ratio 0.3 0.3   Macula Blunted foveal reflex, +cystic edema, scattered MA, no exudate Blunted foveal reflex, massive central edema with +central SRF, scattered DBH and MAs, puncatate exudates temporal macula   Vessels Vascular attenuation, Tortuous Vascular attenuation, Tortuous   Periphery Attached, scattered DBH Attached, pigmented chorodial nevus superiorly with mild elevation, +drusen, no SRF        Refraction    Manifest Refraction      Sphere Cylinder Axis Dist VA   Right [place] +0.50 175 20/100-2   Left -0.50 +1.00 160 20/300-2          IMAGING AND PROCEDURES  Imaging and Procedures for @TODAY @  OCT, Retina - OU - Both Eyes       Right Eye Quality was good. Central Foveal Thickness: 805. Progression has no prior data. Findings include abnormal foveal contour, no SRF, intraretinal hyper-reflective material, intraretinal fluid.   Left Eye Quality was good. Central Foveal Thickness: 1070. Progression has no prior data. Findings include intraretinal fluid, intraretinal hyper-reflective material, subretinal fluid, abnormal foveal contour (Sub PRE hyper-reflective mass consistent with choroidal nevus, superior to disc caught on widefield).   Notes *Images captured and stored on drive  Diagnosis / Impression: Severe macular edema OU (OS > OD) -- likely combination of DME and post-op CME   Clinical management:  See below  Abbreviations:  NFP - Normal foveal profile. CME - cystoid macular edema. PED - pigment  epithelial detachment. IRF - intraretinal fluid. SRF - subretinal fluid. EZ - ellipsoid zone. ERM - epiretinal membrane. ORA - outer retinal atrophy. ORT - outer retinal tubulation. SRHM - subretinal hyper-reflective material        Fluorescein Angiography Optos (Transit OS)       Right Eye   Progression has no prior data. Early phase findings include microaneurysm, vascular perfusion defect, blockage. Mid/Late phase findings include blockage, microaneurysm, vascular perfusion defect (No significant hypofluorescence of disc).   Left Eye   Progression has no prior data. Early phase findings include blockage, microaneurysm, vascular perfusion defect. Mid/Late phase findings include blockage, leakage, microaneurysm, vascular perfusion defect (No significant hypofluorescence of disc).   Notes Images stored on drive;   Impression: Severe NPDR OU Late leaking MA OU Enlarged FAZ OU No NV OU No significant hyperfluorescence of disc         Intravitreal Injection, Pharmacologic Agent - OD - Right Eye       Time Out 07/28/2018. 1:23 PM. Confirmed correct patient, procedure, site, and patient consented.   Anesthesia Topical anesthesia was used. Anesthetic medications included Lidocaine 2%, Proparacaine 0.5%.   Procedure Preparation included 5% betadine to ocular surface, eyelid speculum. A 30 gauge needle was used.   Injection:  1.25 mg Bevacizumab (AVASTIN) SOLN   NDC: 09811-914-78, Lot: [id number] , Expiration date: 09/13/2018   Route: Intravitreal, Site: Right Eye, Waste: 0 mL  Post-op Post injection exam found visual acuity of at least counting fingers. The patient tolerated the procedure well. There were no complications. The patient received written and verbal post procedure care education.                 ASSESSMENT/PLAN:    ICD-10-CM   1. Severe nonproliferative diabetic retinopathy of both eyes with macular edema associated with type 2 diabetes mellitus  (HCC)  21/01/2019 Intravitreal Injection, Pharmacologic Agent - OD - Right Eye    Bevacizumab (AVASTIN) SOLN 1.25 mg  2. 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 Essential hypertension  I10   5. Hypertensive retinopathy of both eyes  H35.033 Fluorescein Angiography Optos (Transit OS)  6. Pseudophakia of both eyes  Z96.1     1. Severe nonproliferative diabetic retinopathy with DME, OU (OS>OD)  - The incidence, risk factors for progression, natural history and treatment options for diabetic retinopathy were discussed with patient.    - The need for close monitoring of blood glucose, blood pressure, and serum lipids, avoiding cigarette or any type of tobacco, and the need for long term follow up was also discussed with patient.  - exam    - FA (06.26.20) shows Severe NPDR OU w/ Late leaking MA OU, Enlarged FAZ OU, No NV OU, No significant hyperfluorescence of disc  - OCT shows diabetic macular edema with cystoid macula edema OU  - recommend IVA OD #1 today, 06.26.20, holding on tx OS due to recent cataract surgery  - RBA of procedure discussed, questions answered  - informed consent obtained and signed  - see procedure note   - f/u in 2 weeks -- DFE/OCT/possible injection OS  2. Retinal Edema OU  - based on FA, suspect majority of macular edema due to DM as above, but there may be some edema secondary to post-op CME  - recommend increasing PF to QID OU (was  TID per Dr. Gershon Crane s/p cataract sx)  - start Ketorolac QID OU  - monitor  - f/u 2 wks  3. Choroidal Nevus OS  - located at 1200, mild elevation, +drusen, no SRF  - baseline optos pictures obtained, 06.26.20  - will monitor for now  4,5.Hypertensive retinopathy OU  - discussed importance of tight BP control  - monitor  6. Pseudophakia OU  - s/p CE/IOL OU (OD on 06.03.20 and OS 06.10.20 by Dr. Gershon Crane)  - beautiful surgeries w/ IOLs in excellent position  - macular edema  limiting vision as above  - monitor   Ophthalmic Meds Ordered this visit:  Meds ordered this encounter  Medications  . prednisoLONE acetate (PRED FORTE) 1 % ophthalmic suspension    Sig: Place 1 drop into both eyes 4 (four) times daily.    Dispense:  15 mL    Refill:  0  . ketorolac (ACULAR) 0.5 % ophthalmic solution    Sig: Place 1 drop into the left eye 4 (four) times daily.    Dispense:  10 mL    Refill:  0  . Bevacizumab (AVASTIN) SOLN 1.25 mg      Return in about 4 weeks (around 08/25/2018).  There are no Patient Instructions on file for this visit.   Explained the diagnoses, plan, and follow up with the patient and they expressed understanding.  Patient expressed understanding of the importance of proper follow up care.   This document serves as a record of services personally performed by Gardiner Sleeper, MD, PhD. It was created on their behalf by Ernest Mallick, OA, an ophthalmic assistant. The creation of this record is the provider's dictation and/or activities during the visit.    Electronically signed by: Ernest Mallick, OA  06.26.2020 9:01 PM    Gardiner Sleeper, M.D., Ph.D. Diseases & Surgery of the Retina and Vitreous [facility]  I have reviewed the above documentation for accuracy and completeness, and I agree with the above. Gardiner Sleeper, M.D., Ph.D. 07/30/18 9:01 PM   Abbreviations: M myopia (nearsighted); A astigmatism; H hyperopia (farsighted); P presbyopia; Mrx spectacle prescription;  CTL contact lenses; OD right eye; OS left eye; OU both eyes  XT exotropia; ET esotropia; PEK punctate epithelial keratitis; PEE punctate epithelial erosions; DES dry eye syndrome; MGD meibomian gland dysfunction; ATs artificial tears; PFAT's preservative free artificial tears; [place] nuclear sclerotic cataract; PSC posterior subcapsular cataract; ERM epi-retinal membrane; PVD posterior vitreous detachment; RD retinal detachment; DM diabetes mellitus; DR  diabetic retinopathy; NPDR non-proliferative diabetic retinopathy; PDR proliferative diabetic retinopathy; CSME clinically significant macular edema; DME diabetic macular edema; dbh dot blot hemorrhages; CWS cotton wool spot; POAG primary open angle glaucoma; C/D cup-to-disc ratio; HVF humphrey visual field; GVF goldmann visual field; OCT optical coherence tomography; IOP intraocular pressure; BRVO Branch retinal vein occlusion; CRVO central retinal vein occlusion; CRAO central retinal artery occlusion; BRAO branch retinal artery occlusion; RT retinal tear; SB scleral buckle; PPV pars plana vitrectomy; VH Vitreous hemorrhage; PRP panretinal laser photocoagulation; IVK intravitreal kenalog; VMT vitreomacular traction; MH Macular hole;  NVD neovascularization of the disc; NVE neovascularization elsewhere; AREDS age related eye disease study; ARMD age related macular degeneration; POAG primary open angle glaucoma; EBMD epithelial/anterior basement membrane dystrophy; ACIOL anterior chamber intraocular lens; IOL intraocular lens; PCIOL posterior chamber intraocular lens; Phaco/IOL phacoemulsification with intraocular lens placement; [place] photorefractive keratectomy; LASIK laser assisted in situ keratomileusis; HTN hypertension; DM diabetes mellitus; COPD chronic obstructive pulmonary disease

## 2018-07-31 DIAGNOSIS — S30860A Insect bite (nonvenomous) of lower back and pelvis, initial encounter: Secondary | ICD-10-CM | POA: Diagnosis not present

## 2018-07-31 DIAGNOSIS — R0789 Other chest pain: Secondary | ICD-10-CM | POA: Diagnosis not present

## 2018-08-11 ENCOUNTER — Other Ambulatory Visit: Payer: Self-pay

## 2018-08-11 ENCOUNTER — Encounter (INDEPENDENT_AMBULATORY_CARE_PROVIDER_SITE_OTHER): Payer: BC Managed Care – PPO | Admitting: Ophthalmology

## 2018-08-17 NOTE — Progress Notes (Addendum)
[facility] Note  08/18/2018     CHIEF COMPLAINT Patient presents for Retina Follow Up   HISTORY OF PRESENT ILLNESS: Juan [name] is a 58 y.o. male who presents to the clinic today for:   HPI    Retina Follow Up    Patient presents with  Other.  In both eyes.  This started weeks ago.  Severity is moderate.  Duration of weeks.  Since onset it is gradually improving.  I, the attending physician,  performed the HPI with the patient and updated documentation appropriately.          Comments    Patient states his vision in his left eye is still very poor but vision is gradually improving OD.  Patient complains of occasional flashes of light in his left eye.  He denies any new or worsening floaters OU.  Patient denies pain or discomfort in either eye.       Last edited by Bernarda Caffey, MD on 08/18/2018  2:15 PM. (History)    Patient states stopped PF and ketorolac last week because drops made eyes feel "heavy".   Referring physician: Harlan Stains, MD [place],  [place] [address]  HISTORICAL INFORMATION:   Selected notes from the MEDICAL RECORD NUMBER Referred by Dr. Rutherford Guys for concern of CME s/p cataract sx LEE:  Ocular Hx- PMH-   CURRENT MEDICATIONS: Current Outpatient Medications (Ophthalmic Drugs)  Medication Sig   ketorolac (ACULAR) 0.5 % ophthalmic solution Place 1 drop into the left eye 4 (four) times daily.   ofloxacin (OCUFLOX) 0.3 % ophthalmic solution Place 1 drop into both eyes 3 (three) times daily.   prednisoLONE acetate (PRED FORTE) 1 % ophthalmic suspension Place 1 drop into both eyes 4 (four) times daily.   No current facility-administered medications for this visit.  (Ophthalmic Drugs)   Current Outpatient Medications (Other)  Medication Sig   ADVAIR DISKUS 500-50 MCG/DOSE AEPB Inhale 1 puff into the lungs 2 (two) times daily.   albuterol (PROVENTIL HFA;VENTOLIN HFA) 108 (90 BASE)  MCG/ACT inhaler Inhale 1-2 puffs into the lungs every 6 (six) hours as needed for wheezing or shortness of breath.    albuterol (PROVENTIL) (2.5 MG/3ML) 0.083% nebulizer solution INHALE CONTENTS OF ONE VIAL IN NEBULIZER THREE TIMES DAILY AS NEEDED   aspirin EC 81 MG tablet Take 81 mg by mouth daily.   atorvastatin (LIPITOR) 40 MG tablet Take 40 mg by mouth daily.   atorvastatin (LIPITOR) 40 MG tablet Take 40 mg by mouth daily.   azithromycin (ZITHROMAX) 250 MG tablet Take 1 tablet (250 mg total) by mouth daily. Take first 2 tablets together, then 1 every day until finished.   benzonatate (TESSALON) 100 MG capsule Take 1 capsule (100 mg total) by mouth every 8 (eight) hours. (Patient not taking: Reported on 07/28/2018)   Empagliflozin-metFORMIN HCl (SYNJARDY) 12.06-998 MG TABS Take by mouth 2 (two) times a day.   esomeprazole (NEXIUM) 40 MG capsule Take 40 mg by mouth daily at 12 noon.   glimepiride (AMARYL) 4 MG tablet Take 4 mg by mouth 2 (two) times daily.   HYDROMET 5-1.5 MG/5ML syrup TAKE ONE TEASPOONFUL BY MOUTH EVERY 4 TO 6 HOURS AS NEEDED FOR COUGH   KOMBIGLYZE XR 2.06-998 MG TB24 Take 2 tablets by mouth at bedtime.    LANTUS SOLOSTAR 100 UNIT/ML Solostar Pen Inject 42 Units into the skin at bedtime.    lisinopril (ZESTRIL) 40 MG tablet  Take 40 mg by mouth daily.   lisinopril-hydrochlorothiazide (PRINZIDE,ZESTORETIC) 20-25 MG per tablet Take 1 tablet by mouth daily.   Multiple Vitamins-Minerals (CENTRUM SILVER 50+MEN PO) as directed.   oxyCODONE (OXY IR/ROXICODONE) 5 MG immediate release tablet Take 1-2 tablets (5-10 mg total) by mouth every 4 (four) hours as needed (5mg  for moderate pain, 10mg  for severe pain). (Patient not taking: Reported on 03/29/2015)   predniSONE (DELTASONE) 20 MG tablet 3 tabs po day one, then 2 po daily x 4 days (Patient not taking: Reported on 07/28/2018)   No current facility-administered medications for this visit.  (Other)      REVIEW OF  SYSTEMS: ROS    Positive for: Endocrine, Eyes, Respiratory   Negative for: Constitutional, Gastrointestinal, Neurological, Skin, Genitourinary, Musculoskeletal, HENT, Cardiovascular, Psychiatric, Allergic/Imm, Heme/Lymph   Last edited by Doneen Poisson on 08/18/2018  2:03 PM. (History)       ALLERGIES Allergies  Allergen Reactions   Naproxen Sodium Shortness Of Breath   Penicillins Rash    Has patient had a PCN reaction causing immediate rash, facial/tongue/throat swelling, SOB or lightheadedness with hypotension: Yes Has patient had a PCN reaction causing severe rash involving mucus membranes or skin necrosis: No Has patient had a PCN reaction that required hospitalization No Has patient had a PCN reaction occurring within the last 10 years: No If all of the above answers are "NO", then may proceed with Cephalosporin use.     PAST MEDICAL HISTORY Past Medical History:  Diagnosis Date   Arthritis    Asthma    COPD (chronic obstructive pulmonary disease) ([place])    Diabetes mellitus    Type 2   GERD (gastroesophageal reflux disease)    H/O hiatal hernia    History of kidney stones    Hyperlipemia    Hypertension    Nasal polyps    Pneumonia 2014   Shortness of breath    Sleep apnea    pt has CPAP but is unable to use it d/t having polyps in his nose. Pt stated "I need to call and get a CPAP mask instead"   Stomach ulcer    Past Surgical History:  Procedure Laterality Date   CATARACT EXTRACTION Right 07/05/2018   Dr. Gershon Crane   CATARACT EXTRACTION Left 07/12/2018   Dr. Gershon Crane   CHOLECYSTECTOMY N/A 01/23/2015   Procedure: LAPAROSCOPIC CHOLECYSTECTOMY ;  Surgeon: Georganna Skeans, MD;  Location: [facility];  Service: General;  Laterality: N/A;   COLONOSCOPY W/ POLYPECTOMY     ESOPHAGOGASTRODUODENOSCOPY     KNEE ARTHROSCOPY  03/24/2011   Procedure: ARTHROSCOPY KNEE;  Surgeon: Alta Corning, MD;  Location: [facility];  Service: Orthopedics;   Laterality: Right;  partial medial menisectomy, chondroplaty patella, and medial medial plica    FAMILY HISTORY Family History  Problem Relation Age of Onset   Diabetes Father     SOCIAL HISTORY Social History   Tobacco Use   Smoking status: Never Smoker   Smokeless tobacco: Current User    Types: Chew  Substance Use Topics   Alcohol use: Yes    Comment: social, 1x weekly   Drug use: No         OPHTHALMIC EXAM:  Base Eye Exam    Visual Acuity (Snellen - Linear)      Right Left   Dist [place] 20/40 -1 20/300   Dist ph [place] NI NI       Tonometry (Tonopen, 2:05 PM)      Right  Left   Pressure 13 14       Pupils      Dark Light Shape React APD   Right 3 2 Round Brisk 0   Left 3 2 Round Brisk 0       Extraocular Movement      Right Left    Full Full       Neuro/Psych    Oriented x3: Yes   Mood/Affect: Normal       Dilation    Both eyes: 1.0% Mydriacyl, 2.5% Phenylephrine @ 2:05 PM        Slit Lamp and Fundus Exam    Slit Lamp Exam      Right Left   Lids/Lashes Dermatochalasis - upper lid, Telangiectasia, mild Meibomian gland dysfunction Dermatochalasis - upper lid, Telangiectasia, mild Meibomian gland dysfunction   Conjunctiva/Sclera White and quiet Mild nasal Pinguecula   Cornea Mild Arcus, Well healed temporal cataract wounds Arcus, 1+ Descemet's folds, Well healed temporal cataract wounds   Anterior Chamber 1+ cell/pigment deep, 1-2+ cell/pigment   Iris Round and dilated, mild patch of atrophy at 0800, no NVI Round and dilated, No NVI   Lens PC IOL in good position PC IOL in good position   Vitreous Vitreous syneresis Vitreous syneresis       Fundus Exam      Right Left   Disc Pink and Sharp Pink and Sharp   C/D Ratio 0.3 0.3   Macula Blunted foveal reflex, +cystic edema--mild improvement in IRF but still massive, scattered MA, no exudate Blunted foveal reflex, massive central edema with +central SRF--stable from prior, scattered DBH and MAs,  puncatate exudates temporal macula   Vessels Vascular attenuation, Tortuous Vascular attenuation, Tortuous   Periphery Attached, scattered DBH Attached, pigmented choroidal nevus superiorly with mild elevation, +drusen, no SRF          IMAGING AND PROCEDURES  Imaging and Procedures for @TODAY @  OCT, Retina - OU - Both Eyes       Right Eye Quality was good. Central Foveal Thickness: 574. Progression has improved. Findings include abnormal foveal contour, intraretinal hyper-reflective material, intraretinal fluid, subretinal fluid (Mild interval improvement in foveal profile and IRF, mild interval increase in SRF).   Left Eye Quality was good. Central Foveal Thickness: 954. Progression has improved. Findings include intraretinal fluid, intraretinal hyper-reflective material, subretinal fluid, abnormal foveal contour (Mild interval improvement in IRF/SRF, but still massive macular edema, Sub-RPE hyper-reflective mass consistent with choroidal nevus, superior to disc caught on widefield).   Notes *Images captured and stored on drive  Diagnosis / Impression: Severe DME OU OD: Mild interval improvement in foveal profile and IRF, mild interval increase in SRF OS: Mild interval improvement in IRF/SRF, but still massive macular edema, Sub RPE hyper-reflective mass consistent with choroidal nevus, superior to disc caught on widefield   Clinical management:  See below  Abbreviations: NFP - Normal foveal profile. CME - cystoid macular edema. PED - pigment epithelial detachment. IRF - intraretinal fluid. SRF - subretinal fluid. EZ - ellipsoid zone. ERM - epiretinal membrane. ORA - outer retinal atrophy. ORT - outer retinal tubulation. SRHM - subretinal hyper-reflective material        Intravitreal Injection, Pharmacologic Agent - OS - Left Eye       Time Out 08/18/2018. 3:08 PM. Confirmed correct patient, procedure, site, and patient consented.   Anesthesia Topical anesthesia was  used. Anesthetic medications included Lidocaine 2%, Proparacaine 0.5%.   Procedure Preparation included 5% betadine to ocular surface, eyelid  speculum. A supplied needle was used.   Injection:  1.25 mg Bevacizumab (AVASTIN) SOLN   NDC: 25427-062-37, Lot: [id number] , Expiration date: 10/11/2018   Route: Intravitreal, Site: Left Eye, Waste: 0 mL  Post-op Post injection exam found visual acuity of at least counting fingers. The patient tolerated the procedure well. There were no complications. The patient received written and verbal post procedure care education.                 ASSESSMENT/PLAN:    ICD-10-CM   1. Severe nonproliferative diabetic retinopathy of both eyes with macular edema associated with type 2 diabetes mellitus (HCC)  22/10/2018 Intravitreal Injection, Pharmacologic Agent - OS - Left Eye    Bevacizumab (AVASTIN) SOLN 1.25 mg  2. 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 Essential hypertension  I10   5. Hypertensive retinopathy of both eyes  H35.033   6. Pseudophakia of both eyes  Z96.1     1. Severe nonproliferative diabetic retinopathy with DME, OU (OS>OD)  - s/p IVA OD #1 (06.26.20)  - The incidence, risk factors for progression, natural history and treatment options for diabetic retinopathy were discussed with patient.    - The need for close monitoring of blood glucose, blood pressure, and serum lipids, avoiding cigarette or any type of tobacco, and the need for long term follow up was also discussed with patient.  - FA (06.26.20) shows Severe NPDR OU w/ Late leaking MA OU, Enlarged FAZ OU, No NV OU, No significant hyperfluorescence of disc  - BCVA OD improved from 20/150 to 20/40 today, OS improved from 20/400 to 20/300  - OCT shows mild interval improvement in foveal profile and IRF, mild interval increase in SRF OD and mild interval improvement in IRF/SRF, but still massive macular edema  - recommend IVA  OS #1 today, 07.17.20  - pt wishes to proceed  - RBA of procedure discussed, questions answered  - informed consent obtained and signed  - see procedure note  - f/u in 4 weeks -- DFE/OCT/possible injection OU  2. Retinal Edema OU  - based on FA, suspect majority of macular edema due to DM as above, but there may be some edema secondary to post-op CME  - patient stopped taking PF and ketorolac due to drops making eyes feel heavy  - restart PF qid OU and ketorolac qid OU  - monitor  - f/u 4 wks  3. Choroidal Nevus OS  - located at 1200, mild elevation, +drusen, no SRF  - baseline optos pictures obtained, 06.26.20  - discussed possible referral to [facility] at [facility] for evaluation/management  4,5.Hypertensive retinopathy OU  - discussed importance of tight BP control  - monitor  6. Pseudophakia OU  - s/p CE/IOL OU (OD on 06.03.20 and OS 06.10.20 by Dr. 19.10.20)  - beautiful surgeries w/ IOLs in excellent position  - macular edema limiting vision as above  - monitor   Ophthalmic Meds Ordered this visit:  Meds ordered this encounter  Medications   Bevacizumab (AVASTIN) SOLN 1.25 mg      Return 4 weeks, for DFE, OCT.  There are no Patient Instructions on file for this visit.   Explained the diagnoses, plan, and follow up with the patient and they expressed understanding.  Patient expressed understanding of the importance of proper follow up care.   This document serves as a record of services personally  performed by Gardiner Sleeper, MD, PhD. It was created on their behalf by Ernest Mallick, OA, an ophthalmic assistant. The creation of this record is the provider's dictation and/or activities during the visit.    Electronically signed by: Ernest Mallick, OA  07.16.2020 3:56 PM    Gardiner Sleeper, M.D., Ph.D. Diseases & Surgery of the Retina and Vitreous [facility]  I have reviewed the above documentation for accuracy and  completeness, and I agree with the above. Gardiner Sleeper, M.D., Ph.D. 08/18/18 3:59 PM    Abbreviations: M myopia (nearsighted); A astigmatism; H hyperopia (farsighted); P presbyopia; Mrx spectacle prescription;  CTL contact lenses; OD right eye; OS left eye; OU both eyes  XT exotropia; ET esotropia; PEK punctate epithelial keratitis; PEE punctate epithelial erosions; DES dry eye syndrome; MGD meibomian gland dysfunction; ATs artificial tears; PFAT's preservative free artificial tears; [place] nuclear sclerotic cataract; PSC posterior subcapsular cataract; ERM epi-retinal membrane; PVD posterior vitreous detachment; RD retinal detachment; DM diabetes mellitus; DR diabetic retinopathy; NPDR non-proliferative diabetic retinopathy; PDR proliferative diabetic retinopathy; CSME clinically significant macular edema; DME diabetic macular edema; dbh dot blot hemorrhages; CWS cotton wool spot; POAG primary open angle glaucoma; C/D cup-to-disc ratio; HVF humphrey visual field; GVF goldmann visual field; OCT optical coherence tomography; IOP intraocular pressure; BRVO Branch retinal vein occlusion; CRVO central retinal vein occlusion; CRAO central retinal artery occlusion; BRAO branch retinal artery occlusion; RT retinal tear; SB scleral buckle; PPV pars plana vitrectomy; VH Vitreous hemorrhage; PRP panretinal laser photocoagulation; IVK intravitreal kenalog; VMT vitreomacular traction; MH Macular hole;  NVD neovascularization of the disc; NVE neovascularization elsewhere; AREDS age related eye disease study; ARMD age related macular degeneration; POAG primary open angle glaucoma; EBMD epithelial/anterior basement membrane dystrophy; ACIOL anterior chamber intraocular lens; IOL intraocular lens; PCIOL posterior chamber intraocular lens; Phaco/IOL phacoemulsification with intraocular lens placement; [place] photorefractive keratectomy; LASIK laser assisted in situ keratomileusis; HTN hypertension; DM diabetes mellitus; COPD  chronic obstructive pulmonary disease

## 2018-08-18 ENCOUNTER — Encounter (INDEPENDENT_AMBULATORY_CARE_PROVIDER_SITE_OTHER): Payer: Self-pay | Admitting: Ophthalmology

## 2018-08-18 ENCOUNTER — Ambulatory Visit (INDEPENDENT_AMBULATORY_CARE_PROVIDER_SITE_OTHER): Payer: BC Managed Care – PPO | Admitting: Ophthalmology

## 2018-08-18 ENCOUNTER — Other Ambulatory Visit: Payer: Self-pay

## 2018-08-18 DIAGNOSIS — I10 Essential (primary) hypertension: Secondary | ICD-10-CM

## 2018-08-18 DIAGNOSIS — Z961 Presence of intraocular lens: Secondary | ICD-10-CM

## 2018-08-18 DIAGNOSIS — D3132 Benign neoplasm of left choroid: Secondary | ICD-10-CM

## 2018-08-18 DIAGNOSIS — H3581 Retinal edema: Secondary | ICD-10-CM

## 2018-08-18 DIAGNOSIS — E113413 Type 2 diabetes mellitus with severe nonproliferative diabetic retinopathy with macular edema, bilateral: Secondary | ICD-10-CM

## 2018-08-18 DIAGNOSIS — H35033 Hypertensive retinopathy, bilateral: Secondary | ICD-10-CM

## 2018-08-18 MED ORDER — BEVACIZUMAB CHEMO INJECTION 1.25MG/0.05ML SYRINGE FOR KALEIDOSCOPE
1.2500 mg | INTRAVITREAL | Status: AC | PRN
  Administered 2018-08-18: 1.25 mg via INTRAVITREAL

## 2018-09-01 DIAGNOSIS — E1165 Type 2 diabetes mellitus with hyperglycemia: Secondary | ICD-10-CM | POA: Diagnosis not present

## 2018-09-01 DIAGNOSIS — Z794 Long term (current) use of insulin: Secondary | ICD-10-CM | POA: Diagnosis not present

## 2018-09-01 DIAGNOSIS — E1140 Type 2 diabetes mellitus with diabetic neuropathy, unspecified: Secondary | ICD-10-CM | POA: Diagnosis not present

## 2018-09-08 DIAGNOSIS — E1140 Type 2 diabetes mellitus with diabetic neuropathy, unspecified: Secondary | ICD-10-CM | POA: Diagnosis not present

## 2018-09-08 DIAGNOSIS — E1129 Type 2 diabetes mellitus with other diabetic kidney complication: Secondary | ICD-10-CM | POA: Diagnosis not present

## 2018-09-08 DIAGNOSIS — E1169 Type 2 diabetes mellitus with other specified complication: Secondary | ICD-10-CM | POA: Diagnosis not present

## 2018-09-08 DIAGNOSIS — E113413 Type 2 diabetes mellitus with severe nonproliferative diabetic retinopathy with macular edema, bilateral: Secondary | ICD-10-CM | POA: Diagnosis not present

## 2018-09-14 NOTE — Progress Notes (Signed)
[facility] Note  09/15/2018     CHIEF COMPLAINT Patient presents for Retina Follow Up   HISTORY OF PRESENT ILLNESS: Juan [name] is a 58 y.o. male who presents to the clinic today for:   HPI    Retina Follow Up    Patient presents with  Diabetic Retinopathy.  In both eyes.  This started months ago.  Severity is severe.  Duration of 4 weeks.  Since onset it is stable.  I, the attending physician,  performed the HPI with the patient and updated documentation appropriately.          Comments    58 y/o male pt here for 4 wk f/u for NPDR OU.  Can see more light out of OS, but it still appears that parts of the image in his vision OS are missing or distorted.  No change in [place] OD.  Denies pain, floaters, but sees a rare temporal flash OD.  BS 120 this a.m.  A1C 7.0 this week.  Ofloxacin BID OU Pred BID OU Ketorolac BID OU       Last edited by Bernarda Caffey, MD on 09/15/2018 12:37 PM. (History)    Patient states sees more light OS, but parts of images still missing OS. A1c was 7.0 last week, up from the 6 range. Vision seems the same OD.  Referring physician: Harlan Stains, MD [place],  [place] [address]  HISTORICAL INFORMATION:   Selected notes from the MEDICAL RECORD NUMBER Referred by Dr. Rutherford Guys for concern of CME s/p cataract sx LEE:  Ocular Hx- PMH-   CURRENT MEDICATIONS: Current Outpatient Medications (Ophthalmic Drugs)  Medication Sig  . ketorolac (ACULAR) 0.5 % ophthalmic solution Place 1 drop into the left eye 4 (four) times daily.  Marland Kitchen ofloxacin (OCUFLOX) 0.3 % ophthalmic solution Place 1 drop into both eyes 3 (three) times daily.  . prednisoLONE acetate (PRED FORTE) 1 % ophthalmic suspension Place 1 drop into both eyes 4 (four) times daily.   No current facility-administered medications for this visit.  (Ophthalmic Drugs)   Current Outpatient Medications (Other)  Medication Sig  . ADVAIR DISKUS  500-50 MCG/DOSE AEPB Inhale 1 puff into the lungs 2 (two) times daily.  Marland Kitchen albuterol (PROVENTIL HFA;VENTOLIN HFA) 108 (90 BASE) MCG/ACT inhaler Inhale 1-2 puffs into the lungs every 6 (six) hours as needed for wheezing or shortness of breath.   Marland Kitchen albuterol (PROVENTIL) (2.5 MG/3ML) 0.083% nebulizer solution INHALE CONTENTS OF ONE VIAL IN NEBULIZER THREE TIMES DAILY AS NEEDED  . alfuzosin (UROXATRAL) 10 MG 24 hr tablet   . aspirin EC 81 MG tablet Take 81 mg by mouth daily.  Marland Kitchen atorvastatin (LIPITOR) 40 MG tablet Take 40 mg by mouth daily.  Marland Kitchen atorvastatin (LIPITOR) 40 MG tablet Take 40 mg by mouth daily.  Marland Kitchen azithromycin (ZITHROMAX) 250 MG tablet Take 1 tablet (250 mg total) by mouth daily. Take first 2 tablets together, then 1 every day until finished.  . benzonatate (TESSALON) 100 MG capsule Take 1 capsule (100 mg total) by mouth every 8 (eight) hours.  . Empagliflozin-metFORMIN HCl (SYNJARDY) 12.06-998 MG TABS Take by mouth 2 (two) times a day.  . esomeprazole (NEXIUM) 40 MG capsule Take 40 mg by mouth daily at 12 noon.  Marland Kitchen glimepiride (AMARYL) 4 MG tablet Take 4 mg by mouth 2 (two) times daily.  Marland Kitchen HYDROMET 5-1.5 MG/5ML syrup TAKE ONE TEASPOONFUL BY MOUTH EVERY 4 TO 6 HOURS  AS NEEDED FOR COUGH  . KOMBIGLYZE XR 2.06-998 MG TB24 Take 2 tablets by mouth at bedtime.   Marland Kitchen LANTUS SOLOSTAR 100 UNIT/ML Solostar Pen Inject 42 Units into the skin at bedtime.   Marland Kitchen lisinopril (ZESTRIL) 40 MG tablet Take 40 mg by mouth daily.  Marland Kitchen lisinopril-hydrochlorothiazide (PRINZIDE,ZESTORETIC) 20-25 MG per tablet Take 1 tablet by mouth daily.  . Multiple Vitamins-Minerals (CENTRUM SILVER 50+MEN PO) as directed.  . nitrofurantoin, macrocrystal-monohydrate, (MACROBID) 100 MG capsule Take 100 mg by mouth 2 (two) times daily.  Marland Kitchen oxyCODONE (OXY IR/ROXICODONE) 5 MG immediate release tablet Take 1-2 tablets (5-10 mg total) by mouth every 4 (four) hours as needed (5mg  for moderate pain, 10mg  for severe pain).  . predniSONE (DELTASONE)  20 MG tablet 3 tabs po day one, then 2 po daily x 4 days  . sulfamethoxazole-trimethoprim (BACTRIM DS) 800-160 MG tablet Take 1 tablet by mouth 2 (two) times daily.   No current facility-administered medications for this visit.  (Other)      REVIEW OF SYSTEMS: ROS    Positive for: Endocrine, Eyes   Negative for: Constitutional, Gastrointestinal, Neurological, Skin, Genitourinary, Musculoskeletal, HENT, Cardiovascular, Respiratory, Psychiatric, Allergic/Imm, Heme/Lymph   Last edited by Matthew Folks, COA on 09/15/2018  9:38 AM. (History)       ALLERGIES Allergies  Allergen Reactions  . Naproxen Sodium Shortness Of Breath  . Penicillins Rash    Has patient had a PCN reaction causing immediate rash, facial/tongue/throat swelling, SOB or lightheadedness with hypotension: Yes Has patient had a PCN reaction causing severe rash involving mucus membranes or skin necrosis: No Has patient had a PCN reaction that required hospitalization No Has patient had a PCN reaction occurring within the last 10 years: No If all of the above answers are "NO", then may proceed with Cephalosporin use.     PAST MEDICAL HISTORY Past Medical History:  Diagnosis Date  . Arthritis   . Asthma   . COPD (chronic obstructive pulmonary disease) ([place])   . Diabetes mellitus    Type 2  . Diabetic retinopathy ([place])    NPDR OU  . GERD (gastroesophageal reflux disease)   . H/O hiatal hernia   . History of kidney stones   . Hyperlipemia   . Hypertension   . Hypertensive retinopathy    OU  . Nasal polyps   . Pneumonia 2014  . Shortness of breath   . Sleep apnea    pt has CPAP but is unable to use it d/t having polyps in his nose. Pt stated "I need to call and get a CPAP mask instead"  . Stomach ulcer    Past Surgical History:  Procedure Laterality Date  . CATARACT EXTRACTION Right 07/05/2018   Dr. Gershon Crane  . CATARACT EXTRACTION Left 07/12/2018   Dr. Gershon Crane  . CHOLECYSTECTOMY N/A 01/23/2015    Procedure: LAPAROSCOPIC CHOLECYSTECTOMY ;  Surgeon: Georganna Skeans, MD;  Location: [facility];  Service: General;  Laterality: N/A;  . COLONOSCOPY W/ POLYPECTOMY    . ESOPHAGOGASTRODUODENOSCOPY    . EYE SURGERY    . KNEE ARTHROSCOPY  03/24/2011   Procedure: ARTHROSCOPY KNEE;  Surgeon: Alta Corning, MD;  Location: [facility];  Service: Orthopedics;  Laterality: Right;  partial medial menisectomy, chondroplaty patella, and medial medial plica    FAMILY HISTORY Family History  Problem Relation Age of Onset  . Diabetes Father     SOCIAL HISTORY Social History   Tobacco Use  . Smoking status: Never Smoker  .  Smokeless tobacco: Current User    Types: Chew  Substance Use Topics  . Alcohol use: Yes    Comment: social, 1x weekly  . Drug use: No         OPHTHALMIC EXAM:  Base Eye Exam    Visual Acuity (Snellen - Linear)      Right Left   Dist [place] 20/50 20/250 -   Dist ph [place] 20/40 NI       Tonometry (Tonopen, 9:45 AM)      Right Left   Pressure 12 13       Pupils      Dark Light Shape React APD   Right 3 2 Round Brisk None   Left 3 2 Round Brisk None       Visual Fields (Counting fingers)      Left Right    Full Full       Extraocular Movement      Right Left    Full, Ortho Full, Ortho       Neuro/Psych    Oriented x3: Yes   Mood/Affect: Normal       Dilation    Both eyes: 1.0% Mydriacyl, 2.5% Phenylephrine @ 9:45 AM        Slit Lamp and Fundus Exam    Slit Lamp Exam      Right Left   Lids/Lashes Dermatochalasis - upper lid, Telangiectasia, mild Meibomian gland dysfunction Dermatochalasis - upper lid, Telangiectasia, mild Meibomian gland dysfunction   Conjunctiva/Sclera White and quiet Mild nasal Pinguecula   Cornea Mild Arcus, Well healed temporal cataract wounds Arcus, 1+ Descemet's folds, Well healed temporal cataract wounds   Anterior Chamber 1+ cell/pigment deep, 1-2+ cell/pigment   Iris Round and dilated, mild patch of atrophy at 0800,  no NVI Round and dilated, No NVI   Lens PC IOL in good position PC IOL in good position   Vitreous Vitreous syneresis Vitreous syneresis       Fundus Exam      Right Left   Disc Pink and Sharp Pink and Sharp   C/D Ratio 0.3 0.3   Macula Blunted foveal reflex, +cystic edema--mild improvement in IRF but still massive, scattered MA--improved, no exudate Blunted foveal reflex, massive central edema with persistent central SRF, scattered DBH and MAs, puncatate exudates inferior to fovea   Vessels Vascular attenuation, Tortuous Vascular attenuation, Tortuous   Periphery Attached, scattered MA Attached, pigmented choroidal nevus superiorly with mild elevation, +drusen, no SRF          IMAGING AND PROCEDURES  Imaging and Procedures for @TODAY @  OCT, Retina - OU - Both Eyes       Right Eye Quality was good. Central Foveal Thickness: 556. Progression has improved. Findings include abnormal foveal contour, intraretinal hyper-reflective material, intraretinal fluid, subretinal fluid (Mild interval improvement in foveal profile and IRF, mild interval improvement in SRF, still significant edema).   Left Eye Quality was good. Central Foveal Thickness: 704. Progression has improved. Findings include intraretinal fluid, intraretinal hyper-reflective material, subretinal fluid, abnormal foveal contour ( interval improvement in IRF, but still massive macular edema, Sub-RPE hyper-reflective mass consistent with choroidal nevus--superior to disc, caught on widefield).   Notes *Images captured and stored on drive  Diagnosis / Impression: Severe DME OU OD: Mild interval improvement in foveal profile, IRF, and SRF, still significant edema OS: Mild interval improvement in IRF, but still massive macular edema, Sub RPE hyper-reflective mass consistent with choroidal nevus, superior to disc caught on widefield  Clinical management:  See below  Abbreviations: NFP - Normal foveal profile. CME - cystoid  macular edema. PED - pigment epithelial detachment. IRF - intraretinal fluid. SRF - subretinal fluid. EZ - ellipsoid zone. ERM - epiretinal membrane. ORA - outer retinal atrophy. ORT - outer retinal tubulation. SRHM - subretinal hyper-reflective material        Intravitreal Injection, Pharmacologic Agent - OD - Right Eye       Time Out 09/15/2018. 9:53 AM. Confirmed correct patient, procedure, site, and patient consented.   Anesthesia Topical anesthesia was used. Anesthetic medications included Lidocaine 2%, Proparacaine 0.5%.   Procedure Preparation included 5% betadine to ocular surface, eyelid speculum. A 30 gauge needle was used.   Injection:  1.25 mg Bevacizumab (AVASTIN) SOLN   NDC: 19147-829-56, Lot: [id number] , Expiration date: 11/02/2018   Route: Intravitreal, Site: Right Eye, Waste: 0 mL  Post-op Post injection exam found visual acuity of at least counting fingers. The patient tolerated the procedure well. There were no complications. The patient received written and verbal post procedure care education.        Intravitreal Injection, Pharmacologic Agent - OS - Left Eye       Time Out 09/15/2018. 9:54 AM. Confirmed correct patient, procedure, site, and patient consented.   Anesthesia Topical anesthesia was used. Anesthetic medications included Lidocaine 2%, Proparacaine 0.5%.   Procedure Preparation included 5% betadine to ocular surface, eyelid speculum. A 30 gauge needle was used.   Injection:  1.25 mg Bevacizumab (AVASTIN) SOLN   NDC: 09/28/2018, Lot: [id number] , Expiration date: 11/02/2018   Route: Intravitreal, Site: Left Eye, Waste: 0 mL  Post-op Post injection exam found visual acuity of at least counting fingers. The patient tolerated the procedure well. There were no complications. The patient received written and verbal post procedure care education.                 ASSESSMENT/PLAN:    ICD-10-CM   1. Severe nonproliferative diabetic  retinopathy of both eyes with macular edema associated with type 2 diabetes mellitus (HCC)  : [id number] Intravitreal Injection, Pharmacologic Agent - OD - Right Eye    Intravitreal Injection, Pharmacologic Agent - OS - Left Eye    Bevacizumab (AVASTIN) SOLN 1.25 mg    Bevacizumab (AVASTIN) SOLN 1.25 mg  2. 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 Essential hypertension  I10   5. Hypertensive retinopathy of both eyes  H35.033   6. Pseudophakia of both eyes  Z96.1     1. Severe nonproliferative diabetic retinopathy with DME, OU (OS>OD)  - s/p IVA OD #1 (06.26.20)  - s/p IVA OS #1 (07.17.20)  - The incidence, risk factors for progression, natural history and treatment options for diabetic retinopathy were discussed with patient.    - The need for close monitoring of blood glucose, blood pressure, and serum lipids, avoiding cigarette or any type of tobacco, and the need for long term follow up was also discussed with patient.  - FA (06.26.20) shows Severe NPDR OU w/ Late leaking MA OU, Enlarged FAZ OU, No NV OU, No significant hyperfluorescence of disc  - BCVA OD stable at 20/40 today, OS relatively stable at 20/250- (was 20/300 at last visit)  - OCT shows mild interval improvement in foveal profile, IRF and SRF OD; and mild interval improvement in IRF OS, but still massive macular edema OU  - recommend IVA OU #2 today, 08.14.20  -  pt wishes to proceed  - RBA of procedure discussed, questions answered  - informed consent obtained and signed  - see procedure note  - f/u in 4 weeks -- DFE/OCT/possible injection OU  2. Retinal Edema OU  - based on FA, suspect majority of macular edema due to DM as above, but there may be some edema secondary to post-op CME  - patient stopped taking PF and ketorolac due to drops making eyes feel heavy  - cont PF qid OU and ketorolac qid OU  - monitor  - f/u 4 wks  3. Choroidal Nevus OS  - located at 1200,  mild elevation, +drusen, no SRF  - baseline optos pictures obtained, 06.26.20  - discussed possible referral to ocular oncologist at [facility] for evaluation/management  4,5.Hypertensive retinopathy OU  - discussed importance of tight BP control  - monitor  6. Pseudophakia OU  - s/p CE/IOL OU (OD on 06.03.20 and OS 06.10.20 by Dr. Gershon Crane)  - beautiful surgeries w/ IOLs in excellent position  - macular edema limiting vision as above  - monitor   Ophthalmic Meds Ordered this visit:  Meds ordered this encounter  Medications  . Bevacizumab (AVASTIN) SOLN 1.25 mg  . Bevacizumab (AVASTIN) SOLN 1.25 mg      Return in about 4 weeks (around 10/13/2018) for DFE, OCT.  There are no Patient Instructions on file for this visit.   Explained the diagnoses, plan, and follow up with the patient and they expressed understanding.  Patient expressed understanding of the importance of proper follow up care.   This document serves as a record of services personally performed by Gardiner Sleeper, MD, PhD. It was created on their behalf by Ernest Mallick, OA, an ophthalmic assistant. The creation of this record is the provider's dictation and/or activities during the visit.    Electronically signed by: Ernest Mallick, OA  08.13.2020 12:42 PM    Gardiner Sleeper, M.D., Ph.D. Diseases & Surgery of the Retina and Vitreous [facility]  I have reviewed the above documentation for accuracy and completeness, and I agree with the above. Gardiner Sleeper, M.D., Ph.D. 09/15/18 12:42 PM   Abbreviations: M myopia (nearsighted); A astigmatism; H hyperopia (farsighted); P presbyopia; Mrx spectacle prescription;  CTL contact lenses; OD right eye; OS left eye; OU both eyes  XT exotropia; ET esotropia; PEK punctate epithelial keratitis; PEE punctate epithelial erosions; DES dry eye syndrome; MGD meibomian gland dysfunction; ATs artificial tears; PFAT's preservative free artificial tears; [place]  nuclear sclerotic cataract; PSC posterior subcapsular cataract; ERM epi-retinal membrane; PVD posterior vitreous detachment; RD retinal detachment; DM diabetes mellitus; DR diabetic retinopathy; NPDR non-proliferative diabetic retinopathy; PDR proliferative diabetic retinopathy; CSME clinically significant macular edema; DME diabetic macular edema; dbh dot blot hemorrhages; CWS cotton wool spot; POAG primary open angle glaucoma; C/D cup-to-disc ratio; HVF humphrey visual field; GVF goldmann visual field; OCT optical coherence tomography; IOP intraocular pressure; BRVO Branch retinal vein occlusion; CRVO central retinal vein occlusion; CRAO central retinal artery occlusion; BRAO branch retinal artery occlusion; RT retinal tear; SB scleral buckle; PPV pars plana vitrectomy; VH Vitreous hemorrhage; PRP panretinal laser photocoagulation; IVK intravitreal kenalog; VMT vitreomacular traction; MH Macular hole;  NVD neovascularization of the disc; NVE neovascularization elsewhere; AREDS age related eye disease study; ARMD age related macular degeneration; POAG primary open angle glaucoma; EBMD epithelial/anterior basement membrane dystrophy; ACIOL anterior chamber intraocular lens; IOL intraocular lens; PCIOL posterior chamber intraocular lens; Phaco/IOL phacoemulsification with intraocular lens placement;  [place] photorefractive keratectomy; LASIK laser assisted in situ keratomileusis; HTN hypertension; DM diabetes mellitus; COPD chronic obstructive pulmonary disease

## 2018-09-15 ENCOUNTER — Other Ambulatory Visit: Payer: Self-pay

## 2018-09-15 ENCOUNTER — Encounter (INDEPENDENT_AMBULATORY_CARE_PROVIDER_SITE_OTHER): Payer: Self-pay | Admitting: Ophthalmology

## 2018-09-15 ENCOUNTER — Ambulatory Visit (INDEPENDENT_AMBULATORY_CARE_PROVIDER_SITE_OTHER): Payer: BC Managed Care – PPO | Admitting: Ophthalmology

## 2018-09-15 DIAGNOSIS — Z961 Presence of intraocular lens: Secondary | ICD-10-CM

## 2018-09-15 DIAGNOSIS — D3132 Benign neoplasm of left choroid: Secondary | ICD-10-CM | POA: Diagnosis not present

## 2018-09-15 DIAGNOSIS — H3581 Retinal edema: Secondary | ICD-10-CM | POA: Diagnosis not present

## 2018-09-15 DIAGNOSIS — E113413 Type 2 diabetes mellitus with severe nonproliferative diabetic retinopathy with macular edema, bilateral: Secondary | ICD-10-CM

## 2018-09-15 DIAGNOSIS — H35033 Hypertensive retinopathy, bilateral: Secondary | ICD-10-CM

## 2018-09-15 DIAGNOSIS — I10 Essential (primary) hypertension: Secondary | ICD-10-CM | POA: Diagnosis not present

## 2018-09-15 MED ORDER — BEVACIZUMAB CHEMO INJECTION 1.25MG/0.05ML SYRINGE FOR KALEIDOSCOPE
1.2500 mg | INTRAVITREAL | Status: AC | PRN
  Administered 2018-09-15: 1.25 mg via INTRAVITREAL

## 2018-10-05 ENCOUNTER — Other Ambulatory Visit: Payer: Self-pay

## 2018-10-05 DIAGNOSIS — R6889 Other general symptoms and signs: Secondary | ICD-10-CM | POA: Diagnosis not present

## 2018-10-05 DIAGNOSIS — Z20822 Contact with and (suspected) exposure to covid-19: Secondary | ICD-10-CM

## 2018-10-06 LAB — NOVEL CORONAVIRUS, NAA: SARS-CoV-2, NAA: NOT DETECTED

## 2018-10-13 ENCOUNTER — Encounter (INDEPENDENT_AMBULATORY_CARE_PROVIDER_SITE_OTHER): Payer: BC Managed Care – PPO | Admitting: Ophthalmology

## 2018-10-19 NOTE — Progress Notes (Signed)
[facility] Note  10/20/2018     CHIEF COMPLAINT Patient presents for Retina Follow Up   HISTORY OF PRESENT ILLNESS: Juan [name] is a 58 y.o. male who presents to the clinic today for:   HPI    Retina Follow Up    Patient presents with  Diabetic Retinopathy.  In both eyes.  This started 4 months ago.  Severity is mild.  Since onset it is stable.  I, the attending physician,  performed the HPI with the patient and updated documentation appropriately.          Comments    F/U NPDR OU. Patient states couple weeks ago  he had a flashes of light outside corner of eye ou, it was just after eating, Bs have been stable per patient.Bs 91 this am. Pt continues to have double vision and his vision "has become blurrier". Pt reports he some paritcles to get into his OD, he feels as if he still has something in his eye.        Last edited by Bernarda Caffey, MD on 10/20/2018  1:24 PM. (History)    Patient states his blood sugars have been in the 90-low 100s, he states he does not check his blood pressure, he states he felt like something was in his right eye, but it has washed out   Referring physician: Rutherford Guys, MD [place],  [place] [address]  HISTORICAL INFORMATION:   Selected notes from the [facility] Referred by Dr. Rutherford Guys for concern of CME s/p cataract sx LEE:  Ocular Hx- PMH-   CURRENT MEDICATIONS: Current Outpatient Medications (Ophthalmic Drugs)  Medication Sig  . ketorolac (ACULAR) 0.5 % ophthalmic solution Place 1 drop into both eyes 4 (four) times daily.  Marland Kitchen ofloxacin (OCUFLOX) 0.3 % ophthalmic solution Place 1 drop into both eyes 3 (three) times daily.  . prednisoLONE acetate (PRED FORTE) 1 % ophthalmic suspension Place 1 drop into both eyes 4 (four) times daily.   No current facility-administered medications for this visit.  (Ophthalmic Drugs)   Current Outpatient Medications (Other)  Medication Sig   . ADVAIR DISKUS 500-50 MCG/DOSE AEPB Inhale 1 puff into the lungs 2 (two) times daily.  Marland Kitchen albuterol (PROVENTIL HFA;VENTOLIN HFA) 108 (90 BASE) MCG/ACT inhaler Inhale 1-2 puffs into the lungs every 6 (six) hours as needed for wheezing or shortness of breath.   Marland Kitchen albuterol (PROVENTIL) (2.5 MG/3ML) 0.083% nebulizer solution INHALE CONTENTS OF ONE VIAL IN NEBULIZER THREE TIMES DAILY AS NEEDED  . alfuzosin (UROXATRAL) 10 MG 24 hr tablet   . aspirin EC 81 MG tablet Take 81 mg by mouth daily.  Marland Kitchen atorvastatin (LIPITOR) 40 MG tablet Take 40 mg by mouth daily.  Marland Kitchen atorvastatin (LIPITOR) 40 MG tablet Take 40 mg by mouth daily.  Marland Kitchen azithromycin (ZITHROMAX) 250 MG tablet Take 1 tablet (250 mg total) by mouth daily. Take first 2 tablets together, then 1 every day until finished.  . benzonatate (TESSALON) 100 MG capsule Take 1 capsule (100 mg total) by mouth every 8 (eight) hours.  . Empagliflozin-metFORMIN HCl (SYNJARDY) 12.06-998 MG TABS Take by mouth 2 (two) times a day.  . esomeprazole (NEXIUM) 40 MG capsule Take 40 mg by mouth daily at 12 noon.  Marland Kitchen glimepiride (AMARYL) 4 MG tablet Take 4 mg by mouth 2 (two) times daily.  Marland Kitchen HYDROMET 5-1.5 MG/5ML syrup TAKE ONE TEASPOONFUL BY MOUTH EVERY 4 TO 6 HOURS AS NEEDED FOR  COUGH  . KOMBIGLYZE XR 2.06-998 MG TB24 Take 2 tablets by mouth at bedtime.   Marland Kitchen LANTUS SOLOSTAR 100 UNIT/ML Solostar Pen Inject 42 Units into the skin at bedtime.   Marland Kitchen lisinopril (ZESTRIL) 40 MG tablet Take 40 mg by mouth daily.  Marland Kitchen lisinopril-hydrochlorothiazide (PRINZIDE,ZESTORETIC) 20-25 MG per tablet Take 1 tablet by mouth daily.  . Multiple Vitamins-Minerals (CENTRUM SILVER 50+MEN PO) as directed.  . nitrofurantoin, macrocrystal-monohydrate, (MACROBID) 100 MG capsule Take 100 mg by mouth 2 (two) times daily.  Marland Kitchen oxyCODONE (OXY IR/ROXICODONE) 5 MG immediate release tablet Take 1-2 tablets (5-10 mg total) by mouth every 4 (four) hours as needed (5mg  for moderate pain, 10mg  for severe pain).  .  predniSONE (DELTASONE) 20 MG tablet 3 tabs po day one, then 2 po daily x 4 days  . sulfamethoxazole-trimethoprim (BACTRIM DS) 800-160 MG tablet Take 1 tablet by mouth 2 (two) times daily.   No current facility-administered medications for this visit.  (Other)      REVIEW OF SYSTEMS: ROS    Positive for: Endocrine, Eyes   Negative for: Constitutional, Gastrointestinal, Neurological, Skin, Genitourinary, Musculoskeletal, HENT, Cardiovascular, Respiratory, Psychiatric, Allergic/Imm, Heme/Lymph   Last edited by Zenovia Jordan, LPN on 579FGE  624THL PM. (History)       ALLERGIES Allergies  Allergen Reactions  . Naproxen Sodium Shortness Of Breath  . Penicillins Rash    Has patient had a PCN reaction causing immediate rash, facial/tongue/throat swelling, SOB or lightheadedness with hypotension: Yes Has patient had a PCN reaction causing severe rash involving mucus membranes or skin necrosis: No Has patient had a PCN reaction that required hospitalization No Has patient had a PCN reaction occurring within the last 10 years: No If all of the above answers are "NO", then may proceed with Cephalosporin use.     PAST MEDICAL HISTORY Past Medical History:  Diagnosis Date  . Arthritis   . Asthma   . COPD (chronic obstructive pulmonary disease) ([place])   . Diabetes mellitus    Type 2  . Diabetic retinopathy ([place])    NPDR OU  . GERD (gastroesophageal reflux disease)   . H/O hiatal hernia   . History of kidney stones   . Hyperlipemia   . Hypertension   . Hypertensive retinopathy    OU  . Nasal polyps   . Pneumonia 2014  . Shortness of breath   . Sleep apnea    pt has CPAP but is unable to use it d/t having polyps in his nose. Pt stated "I need to call and get a CPAP mask instead"  . Stomach ulcer    Past Surgical History:  Procedure Laterality Date  . CATARACT EXTRACTION Right 07/05/2018   Dr. Gershon Crane  . CATARACT EXTRACTION Left 07/12/2018   Dr. Gershon Crane  . CHOLECYSTECTOMY  N/A 01/23/2015   Procedure: LAPAROSCOPIC CHOLECYSTECTOMY ;  Surgeon: Georganna Skeans, MD;  Location: [facility];  Service: General;  Laterality: N/A;  . COLONOSCOPY W/ POLYPECTOMY    . ESOPHAGOGASTRODUODENOSCOPY    . EYE SURGERY    . KNEE ARTHROSCOPY  03/24/2011   Procedure: ARTHROSCOPY KNEE;  Surgeon: Alta Corning, MD;  Location: [facility];  Service: Orthopedics;  Laterality: Right;  partial medial menisectomy, chondroplaty patella, and medial medial plica    FAMILY HISTORY Family History  Problem Relation Age of Onset  . Diabetes Father     SOCIAL HISTORY Social History   Tobacco Use  . Smoking status: Never Smoker  . Smokeless tobacco: Current  User    Types: Chew  Substance Use Topics  . Alcohol use: Yes    Comment: social, 1x weekly  . Drug use: No         OPHTHALMIC EXAM:  Base Eye Exam    Visual Acuity (Snellen - Linear)      Right Left   Dist [place] 20/60 -1 20/150 -1   Dist ph Okay NI NI       Tonometry (Tonopen, 1:14 PM)      Right Left   Pressure 15 17       Pupils      Dark Light Shape React APD   Right 3 2 Round Brisk None   Left 3 2 Round Brisk None       Visual Fields      Left Right    Full Full       Extraocular Movement      Right Left    Full, Ortho Full, Ortho       Neuro/Psych    Oriented x3: Yes   Mood/Affect: Normal       Dilation    Both eyes: 1.0% Mydriacyl, 2.5% Phenylephrine @ 1:14 PM        Slit Lamp and Fundus Exam    Slit Lamp Exam      Right Left   Lids/Lashes Dermatochalasis - upper lid, Telangiectasia, mild Meibomian gland dysfunction Dermatochalasis - upper lid, Telangiectasia, mild Meibomian gland dysfunction   Conjunctiva/Sclera White and quiet, +concretions on palpebral conj Mild nasal Pinguecula, +concretions on palpebral conj   Cornea Mild Arcus, Well healed temporal cataract wounds Arcus, 1+ Descemet's folds, Well healed temporal cataract wounds   Anterior Chamber 1+ cell/pigment deep, 1-2+  cell/pigment   Iris Round and dilated, mild patch of atrophy at 0800, no NVI Round and dilated, No NVI   Lens PC IOL in good position PC IOL in good position   Vitreous Vitreous syneresis Vitreous syneresis       Fundus Exam      Right Left   Disc Pink and Sharp Pink and Sharp   C/D Ratio 0.3 0.3   Macula Blunted foveal reflex, +cystic edema--mild increase in IRF but still massive, scattered MA--improved, no exudate Blunted foveal reflex, massive central edema with persistent central SRF, scattered DBH and MAs, puncatate exudates inferior to fovea   Vessels Vascular attenuation, Tortuous Vascular attenuation, Tortuous   Periphery Attached, scattered MA Attached, pigmented choroidal nevus superiorly with mild elevation, +drusen, no SRF          IMAGING AND PROCEDURES  Imaging and Procedures for @TODAY @  OCT, Retina - OU - Both Eyes       Right Eye Quality was good. Central Foveal Thickness: 694. Progression has worsened. Findings include abnormal foveal contour, intraretinal hyper-reflective material, intraretinal fluid, subretinal fluid, outer retinal atrophy (interval increase in foveal profile and IRF, mild interval improvement in SRF, still significant edema).   Left Eye Quality was good. Central Foveal Thickness: 818. Progression has worsened. Findings include intraretinal fluid, intraretinal hyper-reflective material, subretinal fluid, abnormal foveal contour (mild interval improvement in IRF/SRF, central edema remains severe, Sub-RPE hyper-reflective mass consistent with choroidal nevus--superior to disc, caught on widefield -- stable from prior).   Notes *Images captured and stored on drive  Diagnosis / Impression: Severe DME OU OD: interval increase in foveal profile and IRF, mild interval improvement in SRF, still significant edema OS: Mild interval improvement in IRF/SRF, central edema remains severe, Sub RPE hyper-reflective mass consistent  with choroidal nevus,  superior to disc caught on widefield -- stable from prior   Clinical management:  See below  Abbreviations: NFP - Normal foveal profile. CME - cystoid macular edema. PED - pigment epithelial detachment. IRF - intraretinal fluid. SRF - subretinal fluid. EZ - ellipsoid zone. ERM - epiretinal membrane. ORA - outer retinal atrophy. ORT - outer retinal tubulation. SRHM - subretinal hyper-reflective material        Intravitreal Injection, Pharmacologic Agent - OD - Right Eye       Time Out 10/20/2018. 1:18 PM. Confirmed correct patient, procedure, site, and patient consented.   Anesthesia Topical anesthesia was used. Anesthetic medications included Lidocaine 2%, Proparacaine 0.5%.   Procedure Preparation included 5% betadine to ocular surface, eyelid speculum. A 30 gauge needle was used.   Injection:  1.25 mg Bevacizumab (AVASTIN) SOLN   NDC: [id number], Lot: [id number] , Expiration date: 12/12/2018   Route: Intravitreal, Site: Right Eye, Waste: 0 mL  Post-op Post injection exam found visual acuity of at least counting fingers. The patient tolerated the procedure well. There were no complications. The patient received written and verbal post procedure care education.        Intravitreal Injection, Pharmacologic Agent - OS - Left Eye       Time Out 10/20/2018. 1:19 PM. Confirmed correct patient, procedure, site, and patient consented.   Anesthesia Topical anesthesia was used. Anesthetic medications included Lidocaine 2%, Proparacaine 0.5%.   Procedure Preparation included 5% betadine to ocular surface, eyelid speculum. A 30 gauge needle was used.   Injection:  1.25 mg Bevacizumab (AVASTIN) SOLN   NDC: 11/02/2018, Lot: [id number] , Expiration date: 01/18/2019   Route: Intravitreal, Site: Left Eye, Waste: 0 mL  Post-op Post injection exam found visual acuity of at least counting fingers. The patient tolerated the procedure well. There were no complications. The  patient received written and verbal post procedure care education.                 ASSESSMENT/PLAN:    ICD-10-CM   1. Severe nonproliferative diabetic retinopathy of both eyes with macular edema associated with type 2 diabetes mellitus (HCC)  [id number] Intravitreal Injection, Pharmacologic Agent - OD - Right Eye    Intravitreal Injection, Pharmacologic Agent - OS - Left Eye    Bevacizumab (AVASTIN) SOLN 1.25 mg    Bevacizumab (AVASTIN) SOLN 1.25 mg  2. 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 Essential hypertension  I10   5. Hypertensive retinopathy of both eyes  H35.033   6. Pseudophakia of both eyes  Z96.1     1. Severe nonproliferative diabetic retinopathy with DME, OU (OS>OD)  - s/p IVA OD #1 (06.26.20)  - s/p IVA OS #1 (07.17.20)  - s/p IVA OU (08.14.20)  - The incidence, risk factors for progression, natural history and treatment options for diabetic retinopathy were discussed with patient.    - The need for close monitoring of blood glucose, blood pressure, and serum lipids, avoiding cigarette or any type of tobacco, and the need for long term follow up was also discussed with patient.  - FA (06.26.20) shows Severe NPDR OU w/ Late leaking MA OU, Enlarged FAZ OU, No NV OU, No significant hyperfluorescence of disc  - BCVA OD decreased to 20/60 from 20/40; OS improved to 20/150 from 20/250  - OCT shows mild interval increase in foveal profile and IRF OD; and mild interval improvement  in IRF/SRF OS, but central edema remains severe OU  - recommend IVA OU #3 today, 09.17.20  - pt wishes to proceed  - RBA of procedure discussed, questions answered  - informed consent obtained and signed  - see procedure note  - f/u in 4 weeks -- DFE/OCT/possible injection OU  2. Retinal Edema OU  - based on FA, suspect majority of macular edema due to DM as above, but there may be some edema secondary to post-op CME  - cont PF qid OU and  ketorolac qid OU  - monitor  - f/u 4 wks  3. Choroidal Nevus OS  - located at 1200, mild elevation, +drusen, no SRF  - baseline optos pictures obtained, 06.26.20  - discussed possible referral to ocular oncologist at [facility] for evaluation/management  4,5.Hypertensive retinopathy OU  - discussed importance of tight BP control  - monitor  6. Pseudophakia OU  - s/p CE/IOL OU (OD on 06.03.20 and OS 06.10.20 by Dr. Gershon Crane)  - beautiful surgeries w/ IOLs in excellent position  - macular edema limiting vision as above  - monitor   Ophthalmic Meds Ordered this visit:  Meds ordered this encounter  Medications  . ketorolac (ACULAR) 0.5 % ophthalmic solution    Sig: Place 1 drop into both eyes 4 (four) times daily.    Dispense:  10 mL    Refill:  3  . Bevacizumab (AVASTIN) SOLN 1.25 mg  . Bevacizumab (AVASTIN) SOLN 1.25 mg      Return in about 4 weeks (around 11/17/2018) for f/u DME OU -- Dilated Exam, OCT, Possible Injxn.  There are no Patient Instructions on file for this visit.   Explained the diagnoses, plan, and follow up with the patient and they expressed understanding.  Patient expressed understanding of the importance of proper follow up care.   This document serves as a record of services personally performed by Gardiner Sleeper, MD, PhD. It was created on their behalf by Ernest Mallick, OA, an ophthalmic assistant. The creation of this record is the provider's dictation and/or activities during the visit.    Electronically signed by: Ernest Mallick, OA  09.17.2020 3:40 PM    Gardiner Sleeper, M.D., Ph.D. Diseases & Surgery of the Retina and Vitreous [facility]  I have reviewed the above documentation for accuracy and completeness, and I agree with the above. Gardiner Sleeper, M.D., Ph.D. 10/20/18 3:40 PM    Abbreviations: M myopia (nearsighted); A astigmatism; H hyperopia (farsighted); P presbyopia; Mrx spectacle prescription;  CTL  contact lenses; OD right eye; OS left eye; OU both eyes  XT exotropia; ET esotropia; PEK punctate epithelial keratitis; PEE punctate epithelial erosions; DES dry eye syndrome; MGD meibomian gland dysfunction; ATs artificial tears; PFAT's preservative free artificial tears; [place] nuclear sclerotic cataract; PSC posterior subcapsular cataract; ERM epi-retinal membrane; PVD posterior vitreous detachment; RD retinal detachment; DM diabetes mellitus; DR diabetic retinopathy; NPDR non-proliferative diabetic retinopathy; PDR proliferative diabetic retinopathy; CSME clinically significant macular edema; DME diabetic macular edema; dbh dot blot hemorrhages; CWS cotton wool spot; POAG primary open angle glaucoma; C/D cup-to-disc ratio; HVF humphrey visual field; GVF goldmann visual field; OCT optical coherence tomography; IOP intraocular pressure; BRVO Branch retinal vein occlusion; CRVO central retinal vein occlusion; CRAO central retinal artery occlusion; BRAO branch retinal artery occlusion; RT retinal tear; SB scleral buckle; PPV pars plana vitrectomy; VH Vitreous hemorrhage; PRP panretinal laser photocoagulation; IVK intravitreal kenalog; VMT vitreomacular traction; MH Macular hole;  NVD  neovascularization of the disc; NVE neovascularization elsewhere; AREDS age related eye disease study; ARMD age related macular degeneration; POAG primary open angle glaucoma; EBMD epithelial/anterior basement membrane dystrophy; ACIOL anterior chamber intraocular lens; IOL intraocular lens; PCIOL posterior chamber intraocular lens; Phaco/IOL phacoemulsification with intraocular lens placement; [place] photorefractive keratectomy; LASIK laser assisted in situ keratomileusis; HTN hypertension; DM diabetes mellitus; COPD chronic obstructive pulmonary disease

## 2018-10-20 ENCOUNTER — Ambulatory Visit (INDEPENDENT_AMBULATORY_CARE_PROVIDER_SITE_OTHER): Payer: BC Managed Care – PPO | Admitting: Ophthalmology

## 2018-10-20 ENCOUNTER — Encounter (INDEPENDENT_AMBULATORY_CARE_PROVIDER_SITE_OTHER): Payer: Self-pay | Admitting: Ophthalmology

## 2018-10-20 ENCOUNTER — Other Ambulatory Visit: Payer: Self-pay

## 2018-10-20 DIAGNOSIS — H3581 Retinal edema: Secondary | ICD-10-CM | POA: Diagnosis not present

## 2018-10-20 DIAGNOSIS — I10 Essential (primary) hypertension: Secondary | ICD-10-CM | POA: Diagnosis not present

## 2018-10-20 DIAGNOSIS — H35033 Hypertensive retinopathy, bilateral: Secondary | ICD-10-CM

## 2018-10-20 DIAGNOSIS — D3132 Benign neoplasm of left choroid: Secondary | ICD-10-CM | POA: Diagnosis not present

## 2018-10-20 DIAGNOSIS — Z961 Presence of intraocular lens: Secondary | ICD-10-CM

## 2018-10-20 DIAGNOSIS — E113413 Type 2 diabetes mellitus with severe nonproliferative diabetic retinopathy with macular edema, bilateral: Secondary | ICD-10-CM

## 2018-10-20 MED ORDER — BEVACIZUMAB CHEMO INJECTION 1.25MG/0.05ML SYRINGE FOR KALEIDOSCOPE
1.2500 mg | INTRAVITREAL | Status: AC | PRN
  Administered 2018-10-20: 16:00:00 1.25 mg via INTRAVITREAL

## 2018-10-20 MED ORDER — KETOROLAC TROMETHAMINE 0.5 % OP SOLN: 1.0000 [drp] | Freq: Four times a day (QID) | OPHTHALMIC | 3 refills | Status: DC

## 2018-11-02 ENCOUNTER — Other Ambulatory Visit: Payer: Self-pay

## 2018-11-02 DIAGNOSIS — Z20822 Contact with and (suspected) exposure to covid-19: Secondary | ICD-10-CM

## 2018-11-02 DIAGNOSIS — Z20828 Contact with and (suspected) exposure to other viral communicable diseases: Secondary | ICD-10-CM | POA: Diagnosis not present

## 2018-11-03 LAB — NOVEL CORONAVIRUS, NAA: SARS-CoV-2, NAA: NOT DETECTED

## 2018-11-14 NOTE — Progress Notes (Signed)
[facility] Note  11/17/2018     CHIEF COMPLAINT Patient presents for Retina Follow Up   HISTORY OF PRESENT ILLNESS: Juan [name] is a 58 y.o. male who presents to the clinic today for:   HPI    Retina Follow Up    Patient presents with  Diabetic Retinopathy.  In both eyes.  This started weeks ago.  Severity is moderate.  Duration of weeks.  Since onset it is stable.  I, the attending physician,  performed the HPI with the patient and updated documentation appropriately.          Comments    Patient states his vision is about the same OU.  Patient denies eye pain or discomfort and denies any new or worsening floaters or fol OU.       Last edited by Bernarda Caffey, MD on 11/17/2018  1:49 PM. (History)    Patient states his blood pressure and blood sugar is doing well, he states his double vision is almost gone  Referring physician: Rutherford Guys, MD [place],  [place] [address]  HISTORICAL INFORMATION:   Selected notes from the MEDICAL RECORD NUMBER Referred by Dr. Rutherford Guys for concern of CME s/p cataract sx LEE:  Ocular Hx- PMH-   CURRENT MEDICATIONS: Current Outpatient Medications (Ophthalmic Drugs)  Medication Sig  . ketorolac (ACULAR) 0.5 % ophthalmic solution Place 1 drop into both eyes 4 (four) times daily.  Marland Kitchen ofloxacin (OCUFLOX) 0.3 % ophthalmic solution Place 1 drop into both eyes 3 (three) times daily.  . prednisoLONE acetate (PRED FORTE) 1 % ophthalmic suspension Place 1 drop into both eyes 4 (four) times daily.   No current facility-administered medications for this visit.  (Ophthalmic Drugs)   Current Outpatient Medications (Other)  Medication Sig  . ADVAIR DISKUS 500-50 MCG/DOSE AEPB Inhale 1 puff into the lungs 2 (two) times daily.  Marland Kitchen albuterol (PROVENTIL HFA;VENTOLIN HFA) 108 (90 BASE) MCG/ACT inhaler Inhale 1-2 puffs into the lungs every 6 (six) hours as needed for wheezing or shortness of breath.    Marland Kitchen albuterol (PROVENTIL) (2.5 MG/3ML) 0.083% nebulizer solution INHALE CONTENTS OF ONE VIAL IN NEBULIZER THREE TIMES DAILY AS NEEDED  . alfuzosin (UROXATRAL) 10 MG 24 hr tablet   . aspirin EC 81 MG tablet Take 81 mg by mouth daily.  Marland Kitchen atorvastatin (LIPITOR) 40 MG tablet Take 40 mg by mouth daily.  Marland Kitchen atorvastatin (LIPITOR) 40 MG tablet Take 40 mg by mouth daily.  Marland Kitchen azithromycin (ZITHROMAX) 250 MG tablet Take 1 tablet (250 mg total) by mouth daily. Take first 2 tablets together, then 1 every day until finished.  . benzonatate (TESSALON) 100 MG capsule Take 1 capsule (100 mg total) by mouth every 8 (eight) hours.  . Empagliflozin-metFORMIN HCl (SYNJARDY) 12.06-998 MG TABS Take by mouth 2 (two) times a day.  . esomeprazole (NEXIUM) 40 MG capsule Take 40 mg by mouth daily at 12 noon.  Marland Kitchen glimepiride (AMARYL) 4 MG tablet Take 4 mg by mouth 2 (two) times daily.  Marland Kitchen HYDROMET 5-1.5 MG/5ML syrup TAKE ONE TEASPOONFUL BY MOUTH EVERY 4 TO 6 HOURS AS NEEDED FOR COUGH  . KOMBIGLYZE XR 2.06-998 MG TB24 Take 2 tablets by mouth at bedtime.   Marland Kitchen LANTUS SOLOSTAR 100 UNIT/ML Solostar Pen Inject 42 Units into the skin at bedtime.   Marland Kitchen lisinopril (ZESTRIL) 40 MG tablet Take 40 mg by mouth daily.  Marland Kitchen lisinopril-hydrochlorothiazide (PRINZIDE,ZESTORETIC) 20-25 MG per tablet Take 1 tablet  by mouth daily.  . Multiple Vitamins-Minerals (CENTRUM SILVER 50+MEN PO) as directed.  . nitrofurantoin, macrocrystal-monohydrate, (MACROBID) 100 MG capsule Take 100 mg by mouth 2 (two) times daily.  Marland Kitchen oxyCODONE (OXY IR/ROXICODONE) 5 MG immediate release tablet Take 1-2 tablets (5-10 mg total) by mouth every 4 (four) hours as needed (5mg  for moderate pain, 10mg  for severe pain).  . predniSONE (DELTASONE) 20 MG tablet 3 tabs po day one, then 2 po daily x 4 days  . sulfamethoxazole-trimethoprim (BACTRIM DS) 800-160 MG tablet Take 1 tablet by mouth 2 (two) times daily.   No current facility-administered medications for this visit.  (Other)       REVIEW OF SYSTEMS: ROS    Positive for: Endocrine, Eyes   Negative for: Constitutional, Gastrointestinal, Neurological, Skin, Genitourinary, Musculoskeletal, HENT, Cardiovascular, Respiratory, Psychiatric, Allergic/Imm, Heme/Lymph   Last edited by Doneen Poisson on 11/17/2018  1:24 PM. (History)       ALLERGIES Allergies  Allergen Reactions  . Naproxen Sodium Shortness Of Breath  . Penicillins Rash    Has patient had a PCN reaction causing immediate rash, facial/tongue/throat swelling, SOB or lightheadedness with hypotension: Yes Has patient had a PCN reaction causing severe rash involving mucus membranes or skin necrosis: No Has patient had a PCN reaction that required hospitalization No Has patient had a PCN reaction occurring within the last 10 years: No If all of the above answers are "NO", then may proceed with Cephalosporin use.     PAST MEDICAL HISTORY Past Medical History:  Diagnosis Date  . Arthritis   . Asthma   . COPD (chronic obstructive pulmonary disease) ([place])   . Diabetes mellitus    Type 2  . Diabetic retinopathy ([place])    NPDR OU  . GERD (gastroesophageal reflux disease)   . H/O hiatal hernia   . History of kidney stones   . Hyperlipemia   . Hypertension   . Hypertensive retinopathy    OU  . Nasal polyps   . Pneumonia 2014  . Shortness of breath   . Sleep apnea    pt has CPAP but is unable to use it d/t having polyps in his nose. Pt stated "I need to call and get a CPAP mask instead"  . Stomach ulcer    Past Surgical History:  Procedure Laterality Date  . CATARACT EXTRACTION Right 07/05/2018   Dr. Gershon Crane  . CATARACT EXTRACTION Left 07/12/2018   Dr. Gershon Crane  . CHOLECYSTECTOMY N/A 01/23/2015   Procedure: LAPAROSCOPIC CHOLECYSTECTOMY ;  Surgeon: Georganna Skeans, MD;  Location: [facility];  Service: General;  Laterality: N/A;  . COLONOSCOPY W/ POLYPECTOMY    . ESOPHAGOGASTRODUODENOSCOPY    . EYE SURGERY    . KNEE ARTHROSCOPY  03/24/2011    Procedure: ARTHROSCOPY KNEE;  Surgeon: Alta Corning, MD;  Location: [facility];  Service: Orthopedics;  Laterality: Right;  partial medial menisectomy, chondroplaty patella, and medial medial plica    FAMILY HISTORY Family History  Problem Relation Age of Onset  . Diabetes Father     SOCIAL HISTORY Social History   Tobacco Use  . Smoking status: Never Smoker  . Smokeless tobacco: Current User    Types: Chew  Substance Use Topics  . Alcohol use: Yes    Comment: social, 1x weekly  . Drug use: No         OPHTHALMIC EXAM:  Base Eye Exam    Visual Acuity (Snellen - Linear)      Right Left  Dist [place] 20/60 20/150 -2   Dist ph [place] 20/40 -2 NI       Tonometry (Tonopen, 1:29 PM)      Right Left   Pressure 21 19       Pupils      Dark Light Shape React APD   Right 3 2 Round Brisk 0   Left 3 2 Round Brisk 0       Visual Fields      Left Right    Full Full       Extraocular Movement      Right Left    Full Full       Neuro/Psych    Oriented x3: Yes   Mood/Affect: Normal       Dilation    Both eyes: 1.0% Mydriacyl, 2.5% Phenylephrine @ 1:30 PM        Slit Lamp and Fundus Exam    Slit Lamp Exam      Right Left   Lids/Lashes Dermatochalasis - upper lid, Telangiectasia, mild Meibomian gland dysfunction Dermatochalasis - upper lid, Telangiectasia, mild Meibomian gland dysfunction   Conjunctiva/Sclera White and quiet, +concretions on palpebral conj Mild nasal Pinguecula, +concretions on palpebral conj   Cornea Mild Arcus, Well healed temporal cataract wounds Arcus, 1+ Descemet's folds, Well healed temporal cataract wounds   Anterior Chamber 1+ cell/pigment deep, 1-2+ cell/pigment   Iris Round and dilated, mild patch of atrophy at 0800, no NVI Round and dilated, No NVI   Lens PC IOL in good position PC IOL in good position   Vitreous Vitreous syneresis Vitreous syneresis       Fundus Exam      Right Left   Disc Pink and Sharp Pink and Sharp    C/D Ratio 0.3 0.3   Macula Blunted foveal reflex, +cystic edema--mild increase in IRF but still massive, scattered MA--improved, no exudate Blunted foveal reflex, massive central edema, scattered DBH and MAs, puncatate exudates inferior to fovea   Vessels Vascular attenuation, Tortuous Vascular attenuation, Tortuous   Periphery Attached, scattered MA Attached, pigmented choroidal nevus superiorly with mild elevation, +drusen, no SRF          IMAGING AND PROCEDURES  Imaging and Procedures for @TODAY @  OCT, Retina - OU - Both Eyes       Right Eye Quality was good. Central Foveal Thickness: 659. Progression has improved. Findings include abnormal foveal contour, intraretinal hyper-reflective material, intraretinal fluid, subretinal fluid, outer retinal atrophy (Mild interval improvement in IRF/SRF, still significant edema).   Left Eye Quality was good. Central Foveal Thickness: 933. Progression has worsened. Findings include intraretinal fluid, intraretinal hyper-reflective material, subretinal fluid, abnormal foveal contour (mild interval increase in IRF, interval improvement in SRF, central edema remains severe, Sub-RPE hyper-reflective mass consistent with choroidal nevus--superior to disc, caught on widefield -- stable from prior).   Notes *Images captured and stored on drive  Diagnosis / Impression: Severe DME OU OD: interval improvement in IRF/SRF, still significant edema OS: Mild interval increase in IRF, mild interval improvement in SRF, central edema remains severe, Sub RPE hyper-reflective mass consistent with choroidal nevus, superior to disc caught on widefield -- stable from prior   Clinical management:  See below  Abbreviations: NFP - Normal foveal profile. CME - cystoid macular edema. PED - pigment epithelial detachment. IRF - intraretinal fluid. SRF - subretinal fluid. EZ - ellipsoid zone. ERM - epiretinal membrane. ORA - outer retinal atrophy. ORT - outer retinal  tubulation. SRHM - subretinal hyper-reflective material  Intravitreal Injection, Pharmacologic Agent - OD - Right Eye       Time Out 11/17/2018. 1:42 PM. Confirmed correct patient, procedure, site, and patient consented.   Anesthesia Topical anesthesia was used. Anesthetic medications included Lidocaine 2%, Proparacaine 0.5%.   Procedure Preparation included 5% betadine to ocular surface, eyelid speculum. A 30 gauge needle was used.   Injection:  1.25 mg Bevacizumab (AVASTIN) SOLN   NDC: [id number], Lot: [id number] , Expiration date: 12/20/2018   Route: Intravitreal, Site: Right Eye, Waste: 0 mL  Post-op Post injection exam found visual acuity of at least counting fingers. The patient tolerated the procedure well. There were no complications. The patient received written and verbal post procedure care education.        Intravitreal Injection, Pharmacologic Agent - OS - Left Eye       Time Out 11/17/2018. 1:43 PM. Confirmed correct patient, procedure, site, and patient consented.   Anesthesia Topical anesthesia was used. Anesthetic medications included Lidocaine 2%, Proparacaine 0.5%.   Procedure Preparation included 5% betadine to ocular surface, eyelid speculum. A 30 gauge needle was used.   Injection:  2 mg aflibercept 11/30/2018) SOLN   NDC: Alfonse Flavors, Lot: [id number], Expiration date: 01/31/2019   Route: Intravitreal, Site: Left Eye, Waste: 0.05 mL  Post-op Post injection exam found visual acuity of at least counting fingers. The patient tolerated the procedure well. There were no complications. The patient received written and verbal post procedure care education.   Notes **SAMPLE MEDICATION ADMINISTERED**                 ASSESSMENT/PLAN:    ICD-10-CM   1. Severe nonproliferative diabetic retinopathy of both eyes with macular edema associated with type 2 diabetes mellitus (HCC)  02/13/2019 Intravitreal Injection, Pharmacologic Agent - OD -  Right Eye    Intravitreal Injection, Pharmacologic Agent - OS - Left Eye    aflibercept (EYLEA) SOLN 2 mg    Bevacizumab (AVASTIN) SOLN 1.25 mg  2. 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 Essential hypertension  I10   5. Hypertensive retinopathy of both eyes  H35.033   6. Pseudophakia of both eyes  Z96.1     1. Severe nonproliferative diabetic retinopathy with DME, OU (OS>OD)  - s/p IVA OD #1 (06.26.20)  - s/p IVA OS #1 (07.17.20)  - s/p IVA OU (08.14.20), #3 (09.17.20)  - The incidence, risk factors for progression, natural history and treatment options for diabetic retinopathy were discussed with patient.    - The need for close monitoring of blood glucose, blood pressure, and serum lipids, avoiding cigarette or any type of tobacco, and the need for long term follow up was also discussed with patient.  - FA (06.26.20) shows Severe NPDR OU w/ Late leaking MA OU, Enlarged FAZ OU, No NV OU, No significant hyperfluorescence of disc  - BCVA OD 20/40 (stable); OS 20/150 (stable)  - OCT shows mild interval improvement in IRF OD; and mild interval increase in IRF OS, but central edema remains severe OU  - discussed possibility of resistance to Avastin  - recommend IVA OD #4 and IVE #1 OS -- sample, today, 10.16.20  - pt wishes to proceed  - RBA of procedure discussed, questions answered  - informed consent obtained and signed  - see procedure note  - [id number] Benefits Investigation initiated 10.16.2020  - f/u in 4 weeks -- DFE/OCT/possible injection OU  2. Retinal  Edema OU  - based on FA, suspect majority of macular edema due to DM as above, but there may be some edema secondary to post-op CME  - cont PF qid OU and ketorolac qid OU  - monitor  - f/u 4 wks  3. Choroidal Nevus OS  - located at 1200, mild elevation, +drusen, no SRF  - baseline optos pictures obtained, 06.26.20  - discussed possible referral to ocular oncologist at  [facility] for evaluation/management  4,5.Hypertensive retinopathy OU  - discussed importance of tight BP control  - monitor  6. Pseudophakia OU  - s/p CE/IOL OU (OD on 06.03.20 and OS 06.10.20 by Dr. Gershon Crane)  - beautiful surgeries w/ IOLs in excellent position  - macular edema limiting vision as above  - monitor   Ophthalmic Meds Ordered this visit:  Meds ordered this encounter  Medications  . aflibercept (EYLEA) SOLN 2 mg  . Bevacizumab (AVASTIN) SOLN 1.25 mg      Return in about 4 weeks (around 12/15/2018) for Dilated Exam, OCT, Possible Injxn.  There are no Patient Instructions on file for this visit.   Explained the diagnoses, plan, and follow up with the patient and they expressed understanding.  Patient expressed understanding of the importance of proper follow up care.   This document serves as a record of services personally performed by Gardiner Sleeper, MD, PhD. It was created on their behalf by Roselee Nova, COMT. The creation of this record is the provider's dictation and/or activities during the visit.  Electronically signed by: Roselee Nova, COMT 11/17/18 4:16 PM   This document serves as a record of services personally performed by Gardiner Sleeper, MD, PhD. It was created on their behalf by Ernest Mallick, OA, an ophthalmic assistant. The creation of this record is the provider's dictation and/or activities during the visit.    Electronically signed by: Ernest Mallick, OA 10.16.2020 4:16 PM  Gardiner Sleeper, M.D., Ph.D. Diseases & Surgery of the Retina and Vitreous [facility] 11/17/18  I have reviewed the above documentation for accuracy and completeness, and I agree with the above. Gardiner Sleeper, M.D., Ph.D. 11/17/18 4:16 PM   Abbreviations: M myopia (nearsighted); A astigmatism; H hyperopia (farsighted); P presbyopia; Mrx spectacle prescription;  CTL contact lenses; OD right eye; OS left eye; OU both eyes  XT exotropia; ET  esotropia; PEK punctate epithelial keratitis; PEE punctate epithelial erosions; DES dry eye syndrome; MGD meibomian gland dysfunction; ATs artificial tears; PFAT's preservative free artificial tears; [place] nuclear sclerotic cataract; PSC posterior subcapsular cataract; ERM epi-retinal membrane; PVD posterior vitreous detachment; RD retinal detachment; DM diabetes mellitus; DR diabetic retinopathy; NPDR non-proliferative diabetic retinopathy; PDR proliferative diabetic retinopathy; CSME clinically significant macular edema; DME diabetic macular edema; dbh dot blot hemorrhages; CWS cotton wool spot; POAG primary open angle glaucoma; C/D cup-to-disc ratio; HVF humphrey visual field; GVF goldmann visual field; OCT optical coherence tomography; IOP intraocular pressure; BRVO Branch retinal vein occlusion; CRVO central retinal vein occlusion; CRAO central retinal artery occlusion; BRAO branch retinal artery occlusion; RT retinal tear; SB scleral buckle; PPV pars plana vitrectomy; VH Vitreous hemorrhage; PRP panretinal laser photocoagulation; IVK intravitreal kenalog; VMT vitreomacular traction; MH Macular hole;  NVD neovascularization of the disc; NVE neovascularization elsewhere; AREDS age related eye disease study; ARMD age related macular degeneration; POAG primary open angle glaucoma; EBMD epithelial/anterior basement membrane dystrophy; ACIOL anterior chamber intraocular lens; IOL intraocular lens; PCIOL posterior chamber intraocular lens; Phaco/IOL phacoemulsification with intraocular lens  placement; [place] photorefractive keratectomy; LASIK laser assisted in situ keratomileusis; HTN hypertension; DM diabetes mellitus; COPD chronic obstructive pulmonary disease

## 2018-11-17 ENCOUNTER — Encounter (INDEPENDENT_AMBULATORY_CARE_PROVIDER_SITE_OTHER): Payer: Self-pay | Admitting: Ophthalmology

## 2018-11-17 ENCOUNTER — Ambulatory Visit (INDEPENDENT_AMBULATORY_CARE_PROVIDER_SITE_OTHER): Payer: BC Managed Care – PPO | Admitting: Ophthalmology

## 2018-11-17 DIAGNOSIS — I10 Essential (primary) hypertension: Secondary | ICD-10-CM

## 2018-11-17 DIAGNOSIS — D3132 Benign neoplasm of left choroid: Secondary | ICD-10-CM | POA: Diagnosis not present

## 2018-11-17 DIAGNOSIS — H3581 Retinal edema: Secondary | ICD-10-CM

## 2018-11-17 DIAGNOSIS — E113413 Type 2 diabetes mellitus with severe nonproliferative diabetic retinopathy with macular edema, bilateral: Secondary | ICD-10-CM | POA: Diagnosis not present

## 2018-11-17 DIAGNOSIS — Z961 Presence of intraocular lens: Secondary | ICD-10-CM

## 2018-11-17 DIAGNOSIS — H35033 Hypertensive retinopathy, bilateral: Secondary | ICD-10-CM

## 2018-11-17 MED ORDER — BEVACIZUMAB CHEMO INJECTION 1.25MG/0.05ML SYRINGE FOR KALEIDOSCOPE
1.2500 mg | INTRAVITREAL | Status: AC | PRN
  Administered 2018-11-17: 15:00:00 1.25 mg via INTRAVITREAL

## 2018-11-17 MED ORDER — AFLIBERCEPT 2MG/0.05ML IZ SOLN FOR KALEIDOSCOPE
2.0000 mg | INTRAVITREAL | Status: AC | PRN
  Administered 2018-11-17: 15:00:00 2 mg via INTRAVITREAL

## 2018-11-20 DIAGNOSIS — M25521 Pain in right elbow: Secondary | ICD-10-CM | POA: Diagnosis not present

## 2018-12-13 NOTE — Progress Notes (Signed)
[facility] Note  12/15/2018     CHIEF COMPLAINT Patient presents for Retina Follow Up   HISTORY OF PRESENT ILLNESS: Juan [name] is a 58 y.o. male who presents to the clinic today for:   HPI    Retina Follow Up    Patient presents with  Diabetic Retinopathy.  In both eyes.  This started weeks ago.  Severity is moderate.  Duration of weeks.  Since onset it is stable.  I, the attending physician,  performed the HPI with the patient and updated documentation appropriately.          Comments    Patient states his left eye has blank spot again.  Patient states just after eylea OS, he noticed a considerable improvement OS but states its back to being blurry again.  Patient denies eye pain or discomfort and denies any new or worsening floaters or fol OU.       Last edited by Bernarda Caffey, MD on 12/17/2018 10:32 PM. (History)    Patient states he feels like the vision in his left eye improved after the injection of Eylea last time for about 4-5 days, but it is back to it's baseline now, he states his sugars were high for about 2 weeks bc he had to be put on prednisone bc his elbow was swollen   Referring physician: Rutherford Guys, MD [place],  [place] [address]  HISTORICAL INFORMATION:   Selected notes from the [facility] Referred by Dr. Rutherford Guys for concern of CME s/p cataract sx LEE:  Ocular Hx- PMH-   CURRENT MEDICATIONS: Current Outpatient Medications (Ophthalmic Drugs)  Medication Sig  . ketorolac (ACULAR) 0.5 % ophthalmic solution Place 1 drop into both eyes 4 (four) times daily.  Marland Kitchen ofloxacin (OCUFLOX) 0.3 % ophthalmic solution Place 1 drop into both eyes 3 (three) times daily.  . prednisoLONE acetate (PRED FORTE) 1 % ophthalmic suspension Place 1 drop into both eyes 4 (four) times daily.   No current facility-administered medications for this visit.  (Ophthalmic Drugs)   Current Outpatient Medications  (Other)  Medication Sig  . ADVAIR DISKUS 500-50 MCG/DOSE AEPB Inhale 1 puff into the lungs 2 (two) times daily.  Marland Kitchen albuterol (PROVENTIL HFA;VENTOLIN HFA) 108 (90 BASE) MCG/ACT inhaler Inhale 1-2 puffs into the lungs every 6 (six) hours as needed for wheezing or shortness of breath.   Marland Kitchen albuterol (PROVENTIL) (2.5 MG/3ML) 0.083% nebulizer solution INHALE CONTENTS OF ONE VIAL IN NEBULIZER THREE TIMES DAILY AS NEEDED  . alfuzosin (UROXATRAL) 10 MG 24 hr tablet   . aspirin EC 81 MG tablet Take 81 mg by mouth daily.  Marland Kitchen atorvastatin (LIPITOR) 40 MG tablet Take 40 mg by mouth daily.  Marland Kitchen atorvastatin (LIPITOR) 40 MG tablet Take 40 mg by mouth daily.  Marland Kitchen azithromycin (ZITHROMAX) 250 MG tablet Take 1 tablet (250 mg total) by mouth daily. Take first 2 tablets together, then 1 every day until finished.  . benzonatate (TESSALON) 100 MG capsule Take 1 capsule (100 mg total) by mouth every 8 (eight) hours.  . Empagliflozin-metFORMIN HCl (SYNJARDY) 12.06-998 MG TABS Take by mouth 2 (two) times a day.  . esomeprazole (NEXIUM) 40 MG capsule Take 40 mg by mouth daily at 12 noon.  Marland Kitchen glimepiride (AMARYL) 4 MG tablet Take 4 mg by mouth 2 (two) times daily.  Marland Kitchen HYDROMET 5-1.5 MG/5ML syrup TAKE ONE TEASPOONFUL BY MOUTH EVERY 4 TO 6 HOURS AS NEEDED FOR COUGH  .  KOMBIGLYZE XR 2.06-998 MG TB24 Take 2 tablets by mouth at bedtime.   Marland Kitchen LANTUS SOLOSTAR 100 UNIT/ML Solostar Pen Inject 42 Units into the skin at bedtime.   Marland Kitchen lisinopril (ZESTRIL) 40 MG tablet Take 40 mg by mouth daily.  Marland Kitchen lisinopril-hydrochlorothiazide (PRINZIDE,ZESTORETIC) 20-25 MG per tablet Take 1 tablet by mouth daily.  . Multiple Vitamins-Minerals (CENTRUM SILVER 50+MEN PO) as directed.  . nitrofurantoin, macrocrystal-monohydrate, (MACROBID) 100 MG capsule Take 100 mg by mouth 2 (two) times daily.  Marland Kitchen oxyCODONE (OXY IR/ROXICODONE) 5 MG immediate release tablet Take 1-2 tablets (5-10 mg total) by mouth every 4 (four) hours as needed (5mg  for moderate pain, 10mg   for severe pain).  . predniSONE (DELTASONE) 20 MG tablet 3 tabs po day one, then 2 po daily x 4 days  . sulfamethoxazole-trimethoprim (BACTRIM DS) 800-160 MG tablet Take 1 tablet by mouth 2 (two) times daily.   No current facility-administered medications for this visit.  (Other)      REVIEW OF SYSTEMS: ROS    Positive for: Endocrine, Eyes   Negative for: Constitutional, Gastrointestinal, Neurological, Skin, Genitourinary, Musculoskeletal, HENT, Cardiovascular, Respiratory, Psychiatric, Allergic/Imm, Heme/Lymph   Last edited by Doneen Poisson on 12/15/2018  2:03 PM. (History)       ALLERGIES Allergies  Allergen Reactions  . Naproxen Sodium Shortness Of Breath  . Penicillins Rash    Has patient had a PCN reaction causing immediate rash, facial/tongue/throat swelling, SOB or lightheadedness with hypotension: Yes Has patient had a PCN reaction causing severe rash involving mucus membranes or skin necrosis: No Has patient had a PCN reaction that required hospitalization No Has patient had a PCN reaction occurring within the last 10 years: No If all of the above answers are "NO", then may proceed with Cephalosporin use.     PAST MEDICAL HISTORY Past Medical History:  Diagnosis Date  . Arthritis   . Asthma   . COPD (chronic obstructive pulmonary disease) ([facility])   . Diabetes mellitus    Type 2  . Diabetic retinopathy ([place])    NPDR OU  . GERD (gastroesophageal reflux disease)   . H/O hiatal hernia   . History of kidney stones   . Hyperlipemia   . Hypertension   . Hypertensive retinopathy    OU  . Nasal polyps   . Pneumonia 2014  . Shortness of breath   . Sleep apnea    pt has CPAP but is unable to use it d/t having polyps in his nose. Pt stated "I need to call and get a CPAP mask instead"  . Stomach ulcer    Past Surgical History:  Procedure Laterality Date  . CATARACT EXTRACTION Right 07/05/2018   Dr. Gershon Crane  . CATARACT EXTRACTION Left 07/12/2018   Dr. Gershon Crane   . CHOLECYSTECTOMY N/A 01/23/2015   Procedure: LAPAROSCOPIC CHOLECYSTECTOMY ;  Surgeon: Georganna Skeans, MD;  Location: [facility];  Service: General;  Laterality: N/A;  . COLONOSCOPY W/ POLYPECTOMY    . ESOPHAGOGASTRODUODENOSCOPY    . EYE SURGERY    . KNEE ARTHROSCOPY  03/24/2011   Procedure: ARTHROSCOPY KNEE;  Surgeon: Alta Corning, MD;  Location: [facility];  Service: Orthopedics;  Laterality: Right;  partial medial menisectomy, chondroplaty patella, and medial medial plica    FAMILY HISTORY Family History  Problem Relation Age of Onset  . Diabetes Father     SOCIAL HISTORY Social History   Tobacco Use  . Smoking status: Never Smoker  . Smokeless tobacco: Current User  Types: Chew  Substance Use Topics  . Alcohol use: Yes    Comment: social, 1x weekly  . Drug use: No         OPHTHALMIC EXAM:  Base Eye Exam    Visual Acuity (Snellen - Linear)      Right Left   Dist [place] 20/50 -2 20/200   Dist ph [place] 20/40 -2 NI       Tonometry (Tonopen, 2:06 PM)      Right Left   Pressure 16 13       Pupils      Dark Light Shape React APD   Right 3 2 Round Brisk 0   Left 3 2 Round Brisk 0       Visual Fields      Left Right    Full Full       Extraocular Movement      Right Left    Full Full       Neuro/Psych    Oriented x3: Yes   Mood/Affect: Normal       Dilation    Both eyes: 1.0% Mydriacyl, 2.5% Phenylephrine @ 2:06 PM        Slit Lamp and Fundus Exam    Slit Lamp Exam      Right Left   Lids/Lashes Dermatochalasis - upper lid, Telangiectasia, mild Meibomian gland dysfunction Dermatochalasis - upper lid, Telangiectasia, mild Meibomian gland dysfunction   Conjunctiva/Sclera White and quiet, +concretions on palpebral conj Mild nasal Pinguecula, +concretions on palpebral conj   Cornea Mild Arcus, Well healed temporal cataract wounds Arcus, 1+ Descemet's folds, Well healed temporal cataract wounds   Anterior Chamber 1+ cell/pigment deep, 1-2+  cell/pigment   Iris Round and dilated, mild patch of atrophy at 0800, no NVI Round and dilated, No NVI   Lens PC IOL in good position PC IOL in good position   Vitreous Vitreous syneresis Vitreous syneresis       Fundus Exam      Right Left   Disc Pink and Sharp Pink and Sharp   C/D Ratio 0.3 0.3   Macula Blunted foveal reflex, +cystic edema--persistent, severe IRF, scattered MA--improved, no exudate Blunted foveal reflex, massive central edema, scattered DBH and MAs, puncatate exudates inferior to fovea - improving   Vessels Vascular attenuation, Tortuous Vascular attenuation, Tortuous   Periphery Attached, scattered MA Attached, pigmented choroidal nevus superiorly with mild elevation, +drusen, no SRF          IMAGING AND PROCEDURES  Imaging and Procedures for @TODAY @  OCT, Retina - OU - Both Eyes       Right Eye Quality was good. Central Foveal Thickness: 663. Progression has been stable. Findings include abnormal foveal contour, intraretinal hyper-reflective material, intraretinal fluid, subretinal fluid, outer retinal atrophy (Persistent massive IRF/SRF -- no significant change from prior).   Left Eye Quality was good. Central Foveal Thickness: 848. Progression has improved. Findings include intraretinal fluid, intraretinal hyper-reflective material, subretinal fluid, abnormal foveal contour (mild interval improvement in IRF - still massive, stable improvement in SRF, central edema remains severe, Sub-RPE hyper-reflective mass consistent with choroidal nevus--superior to disc, caught on widefield -- stable from prior).   Notes *Images captured and stored on drive  Diagnosis / Impression: Severe DME OU OD: Persistent massive IRF/SRF -- no significant change from prior OS: Mild interval improvement in IRF, persistent improvement in SRF, central edema remains severe, Sub RPE hyper-reflective mass consistent with choroidal nevus, superior to disc caught on widefield -- stable  from prior   Clinical management:  See below  Abbreviations: NFP - Normal foveal profile. CME - cystoid macular edema. PED - pigment epithelial detachment. IRF - intraretinal fluid. SRF - subretinal fluid. EZ - ellipsoid zone. ERM - epiretinal membrane. ORA - outer retinal atrophy. ORT - outer retinal tubulation. SRHM - subretinal hyper-reflective material        Intravitreal Injection, Pharmacologic Agent - OD - Right Eye       Time Out 12/15/2018. 3:08 PM. Confirmed correct patient, procedure, site, and patient consented.   Anesthesia Topical anesthesia was used. Anesthetic medications included Lidocaine 2%, Proparacaine 0.5%.   Procedure Preparation included 5% betadine to ocular surface, eyelid speculum. A 30 gauge needle was used.   Injection:  2 mg aflibercept Alfonse Flavors) SOLN   NDC: A3590391, Lot: [id number], Expiration date: 06/28/2019   Route: Intravitreal, Site: Right Eye, Waste: 0.05 mL  Post-op Post injection exam found visual acuity of at least counting fingers. The patient tolerated the procedure well. There were no complications. The patient received written and verbal post procedure care education.        Intravitreal Injection, Pharmacologic Agent - OS - Left Eye       Time Out 12/15/2018. 3:09 PM. Confirmed correct patient, procedure, site, and patient consented.   Anesthesia Topical anesthesia was used. Anesthetic medications included Lidocaine 2%, Proparacaine 0.5%.   Procedure Preparation included 5% betadine to ocular surface, eyelid speculum. A 30 gauge needle was used.   Injection:  2 mg aflibercept Alfonse Flavors) SOLN   NDC: A3590391, [id number], Expiration date: 05/29/2019   Route: Intravitreal, Site: Left Eye, Waste: 0.05 mL  Post-op Post injection exam found visual acuity of at least counting fingers. The patient tolerated the procedure well. There were no complications. The patient received written and verbal post procedure care  education.                 ASSESSMENT/PLAN:    ICD-10-CM   1. Severe nonproliferative diabetic retinopathy of both eyes with macular edema associated with type 2 diabetes mellitus (HCC)  [id number] Intravitreal Injection, Pharmacologic Agent - OD - Right Eye    Intravitreal Injection, Pharmacologic Agent - OS - Left Eye    aflibercept (EYLEA) SOLN 2 mg    aflibercept (EYLEA) SOLN 2 mg  2. 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 Essential hypertension  I10   5. Hypertensive retinopathy of both eyes  H35.033   6. Pseudophakia of both eyes  Z96.1     1. Severe nonproliferative diabetic retinopathy with DME, OU (OS>OD)  - s/p IVA OD #1 (06.26.20), #2 (08.14.20), #3 (09.17.20), #4 (10.16.20)  - s/p IVA OS #1 (07.17.20), #2 (08.14.20), #3 (09.17.20)  - s/p IVA OU (08.14.20), #3 (09.17.20)  - s/p IVE OS #1 (10.16.20) - sample  - FA (06.26.20) shows Severe NPDR OU w/ Late leaking MA OU, Enlarged FAZ OU, No NV OU, No significant hyperfluorescence of disc  - BCVA OD 20/40 (stable); OS 20/150 (stable)  - OCT shows persistent severe IRF/SRF OD,  mild interval improvement in IRF OS -- still severe  - recommend treating both eyes with Eylea today  - recommend IVE OD #1 and IVE #2 OS today, 11.13.20  - pt wishes to proceed  - RBA of procedure discussed, questions answered  - informed consent obtained and signed  - see procedure note  - [email or web address] Benefits Investigation initiated  10.16.2020  - f/u in 4 weeks -- DFE/OCT/possible injection OU  2. Retinal Edema OU  - based on FA, suspect majority of macular edema due to DM as above, but there may be some edema secondary to post-op CME  - cont PF qid OU and ketorolac qid OU -- pt is only using bid -- difficulty with compliance at work  - monitor  - f/u 4 wks  3. Choroidal Nevus OS  - located at 1200, mild elevation, +drusen, no SRF  - baseline optos pictures obtained, 06.26.20  -  discussed possible referral to ocular oncologist at [facility] for evaluation/management  4,5.Hypertensive retinopathy OU  - discussed importance of tight BP control  - monitor  6. Pseudophakia OU  - s/p CE/IOL OU (OD on 06.03.20 and OS 06.10.20 by Dr. Gershon Crane)  - beautiful surgeries w/ IOLs in excellent position  - macular edema limiting vision as above  - monitor   Ophthalmic Meds Ordered this visit:  Meds ordered this encounter  Medications  . aflibercept (EYLEA) SOLN 2 mg  . aflibercept (EYLEA) SOLN 2 mg      Return in about 4 weeks (around 01/12/2019) for f/u NPDR OU, DFE, OCT.  There are no Patient Instructions on file for this visit.   Explained the diagnoses, plan, and follow up with the patient and they expressed understanding.  Patient expressed understanding of the importance of proper follow up care.   This document serves as a record of services personally performed by Gardiner Sleeper, MD, PhD. It was created on their behalf by Ernest Mallick, OA, an ophthalmic assistant. The creation of this record is the provider's dictation and/or activities during the visit.    Electronically signed by: Ernest Mallick, OA 11.11.2020 10:38 PM  Gardiner Sleeper, M.D., Ph.D. Diseases & Surgery of the Retina and Vitreous [facility]  I have reviewed the above documentation for accuracy and completeness, and I agree with the above. Gardiner Sleeper, M.D., Ph.D. 12/17/18 10:38 PM    Abbreviations: M myopia (nearsighted); A astigmatism; H hyperopia (farsighted); P presbyopia; Mrx spectacle prescription;  CTL contact lenses; OD right eye; OS left eye; OU both eyes  XT exotropia; ET esotropia; PEK punctate epithelial keratitis; PEE punctate epithelial erosions; DES dry eye syndrome; MGD meibomian gland dysfunction; ATs artificial tears; PFAT's preservative free artificial tears; [place] nuclear sclerotic cataract; PSC posterior subcapsular cataract; ERM epi-retinal  membrane; PVD posterior vitreous detachment; RD retinal detachment; DM diabetes mellitus; DR diabetic retinopathy; NPDR non-proliferative diabetic retinopathy; PDR proliferative diabetic retinopathy; CSME clinically significant macular edema; DME diabetic macular edema; dbh dot blot hemorrhages; CWS cotton wool spot; POAG primary open angle glaucoma; C/D cup-to-disc ratio; HVF humphrey visual field; GVF goldmann visual field; OCT optical coherence tomography; IOP intraocular pressure; BRVO Branch retinal vein occlusion; CRVO central retinal vein occlusion; CRAO central retinal artery occlusion; BRAO branch retinal artery occlusion; RT retinal tear; SB scleral buckle; PPV pars plana vitrectomy; VH Vitreous hemorrhage; PRP panretinal laser photocoagulation; IVK intravitreal kenalog; VMT vitreomacular traction; MH Macular hole;  NVD neovascularization of the disc; NVE neovascularization elsewhere; AREDS age related eye disease study; ARMD age related macular degeneration; POAG primary open angle glaucoma; EBMD epithelial/anterior basement membrane dystrophy; ACIOL anterior chamber intraocular lens; IOL intraocular lens; PCIOL posterior chamber intraocular lens; Phaco/IOL phacoemulsification with intraocular lens placement; [place] photorefractive keratectomy; LASIK laser assisted in situ keratomileusis; HTN hypertension; DM diabetes mellitus; COPD chronic obstructive pulmonary disease

## 2018-12-15 ENCOUNTER — Ambulatory Visit (INDEPENDENT_AMBULATORY_CARE_PROVIDER_SITE_OTHER): Payer: BC Managed Care – PPO | Admitting: Ophthalmology

## 2018-12-15 DIAGNOSIS — I10 Essential (primary) hypertension: Secondary | ICD-10-CM | POA: Diagnosis not present

## 2018-12-15 DIAGNOSIS — E113413 Type 2 diabetes mellitus with severe nonproliferative diabetic retinopathy with macular edema, bilateral: Secondary | ICD-10-CM

## 2018-12-15 DIAGNOSIS — H3581 Retinal edema: Secondary | ICD-10-CM | POA: Diagnosis not present

## 2018-12-15 DIAGNOSIS — D3132 Benign neoplasm of left choroid: Secondary | ICD-10-CM

## 2018-12-15 DIAGNOSIS — Z961 Presence of intraocular lens: Secondary | ICD-10-CM

## 2018-12-15 DIAGNOSIS — H35033 Hypertensive retinopathy, bilateral: Secondary | ICD-10-CM

## 2018-12-17 ENCOUNTER — Encounter (INDEPENDENT_AMBULATORY_CARE_PROVIDER_SITE_OTHER): Payer: Self-pay | Admitting: Ophthalmology

## 2018-12-17 MED ORDER — AFLIBERCEPT 2MG/0.05ML IZ SOLN FOR KALEIDOSCOPE
2.0000 mg | INTRAVITREAL | Status: AC | PRN
  Administered 2018-12-17: 2 mg via INTRAVITREAL

## 2019-01-12 ENCOUNTER — Encounter (INDEPENDENT_AMBULATORY_CARE_PROVIDER_SITE_OTHER): Payer: Self-pay | Admitting: Ophthalmology

## 2019-01-12 ENCOUNTER — Other Ambulatory Visit: Payer: Self-pay

## 2019-01-12 ENCOUNTER — Ambulatory Visit (INDEPENDENT_AMBULATORY_CARE_PROVIDER_SITE_OTHER): Payer: BC Managed Care – PPO | Admitting: Ophthalmology

## 2019-01-12 DIAGNOSIS — I10 Essential (primary) hypertension: Secondary | ICD-10-CM | POA: Diagnosis not present

## 2019-01-12 DIAGNOSIS — H3581 Retinal edema: Secondary | ICD-10-CM

## 2019-01-12 DIAGNOSIS — D3132 Benign neoplasm of left choroid: Secondary | ICD-10-CM

## 2019-01-12 DIAGNOSIS — E113413 Type 2 diabetes mellitus with severe nonproliferative diabetic retinopathy with macular edema, bilateral: Secondary | ICD-10-CM | POA: Diagnosis not present

## 2019-01-12 DIAGNOSIS — H35033 Hypertensive retinopathy, bilateral: Secondary | ICD-10-CM

## 2019-01-12 DIAGNOSIS — Z961 Presence of intraocular lens: Secondary | ICD-10-CM

## 2019-01-12 NOTE — Progress Notes (Signed)
[facility] Note  01/12/2019     CHIEF COMPLAINT Patient presents for Retina Follow Up   HISTORY OF PRESENT ILLNESS: Juan [name] is a 58 y.o. male who presents to the clinic today for:   HPI    Retina Follow Up    Patient presents with  Diabetic Retinopathy.  In both eyes.  This started 4 weeks ago.  Severity is moderate.  I, the attending physician,  performed the HPI with the patient and updated documentation appropriately.          Comments    Patient here for 4 weeks retina follow up for NPDR OU. Patient states vision getting a little better. No eye pain. OS center is blurred.       Last edited by Bernarda Caffey, MD on 01/13/2019 10:43 PM. (History)    Patient states he can see a little better since receiving the Eylea injection at last visit  Referring physician: Harlan Stains, MD [place],  [place] [id number]  HISTORICAL INFORMATION:   Selected notes from the MEDICAL RECORD NUMBER Referred by Dr. Rutherford Guys for concern of CME s/p cataract sx LEE:  Ocular Hx- PMH-   CURRENT MEDICATIONS: Current Outpatient Medications (Ophthalmic Drugs)  Medication Sig  . ketorolac (ACULAR) 0.5 % ophthalmic solution Place 1 drop into both eyes 4 (four) times daily.  Marland Kitchen ofloxacin (OCUFLOX) 0.3 % ophthalmic solution Place 1 drop into both eyes 3 (three) times daily.  . prednisoLONE acetate (PRED FORTE) 1 % ophthalmic suspension Place 1 drop into both eyes 4 (four) times daily.   No current facility-administered medications for this visit. (Ophthalmic Drugs)   Current Outpatient Medications (Other)  Medication Sig  . ADVAIR DISKUS 500-50 MCG/DOSE AEPB Inhale 1 puff into the lungs 2 (two) times daily.  Marland Kitchen albuterol (PROVENTIL HFA;VENTOLIN HFA) 108 (90 BASE) MCG/ACT inhaler Inhale 1-2 puffs into the lungs every 6 (six) hours as needed for wheezing or shortness of breath.   Marland Kitchen albuterol (PROVENTIL) (2.5 MG/3ML) 0.083% nebulizer  solution INHALE CONTENTS OF ONE VIAL IN NEBULIZER THREE TIMES DAILY AS NEEDED  . alfuzosin (UROXATRAL) 10 MG 24 hr tablet   . aspirin EC 81 MG tablet Take 81 mg by mouth daily.  Marland Kitchen atorvastatin (LIPITOR) 40 MG tablet Take 40 mg by mouth daily.  Marland Kitchen atorvastatin (LIPITOR) 40 MG tablet Take 40 mg by mouth daily.  Marland Kitchen azithromycin (ZITHROMAX) 250 MG tablet Take 1 tablet (250 mg total) by mouth daily. Take first 2 tablets together, then 1 every day until finished.  . benzonatate (TESSALON) 100 MG capsule Take 1 capsule (100 mg total) by mouth every 8 (eight) hours.  . Empagliflozin-metFORMIN HCl (SYNJARDY) 12.06-998 MG TABS Take by mouth 2 (two) times a day.  . esomeprazole (NEXIUM) 40 MG capsule Take 40 mg by mouth daily at 12 noon.  Marland Kitchen glimepiride (AMARYL) 4 MG tablet Take 4 mg by mouth 2 (two) times daily.  Marland Kitchen HYDROMET 5-1.5 MG/5ML syrup TAKE ONE TEASPOONFUL BY MOUTH EVERY 4 TO 6 HOURS AS NEEDED FOR COUGH  . KOMBIGLYZE XR 2.06-998 MG TB24 Take 2 tablets by mouth at bedtime.   Marland Kitchen LANTUS SOLOSTAR 100 UNIT/ML Solostar Pen Inject 42 Units into the skin at bedtime.   Marland Kitchen lisinopril (ZESTRIL) 40 MG tablet Take 40 mg by mouth daily.  Marland Kitchen lisinopril-hydrochlorothiazide (PRINZIDE,ZESTORETIC) 20-25 MG per tablet Take 1 tablet by mouth daily.  . Multiple Vitamins-Minerals (CENTRUM SILVER 50+MEN PO) as directed.  Marland Kitchen  nitrofurantoin, macrocrystal-monohydrate, (MACROBID) 100 MG capsule Take 100 mg by mouth 2 (two) times daily.  Marland Kitchen oxyCODONE (OXY IR/ROXICODONE) 5 MG immediate release tablet Take 1-2 tablets (5-10 mg total) by mouth every 4 (four) hours as needed (5mg  for moderate pain, 10mg  for severe pain).  . predniSONE (DELTASONE) 20 MG tablet 3 tabs po day one, then 2 po daily x 4 days  . sulfamethoxazole-trimethoprim (BACTRIM DS) 800-160 MG tablet Take 1 tablet by mouth 2 (two) times daily.   No current facility-administered medications for this visit. (Other)      REVIEW OF SYSTEMS: ROS    Positive for:  Endocrine, Eyes   Negative for: Constitutional, Gastrointestinal, Neurological, Skin, Genitourinary, Musculoskeletal, HENT, Cardiovascular, Respiratory, Psychiatric, Allergic/Imm, Heme/Lymph   Last edited by Theodore Demark, COA on 01/12/2019  2:47 PM. (History)       ALLERGIES Allergies  Allergen Reactions  . Naproxen Sodium Shortness Of Breath  . Penicillins Rash    Has patient had a PCN reaction causing immediate rash, facial/tongue/throat swelling, SOB or lightheadedness with hypotension: Yes Has patient had a PCN reaction causing severe rash involving mucus membranes or skin necrosis: No Has patient had a PCN reaction that required hospitalization No Has patient had a PCN reaction occurring within the last 10 years: No If all of the above answers are "NO", then may proceed with Cephalosporin use.     PAST MEDICAL HISTORY Past Medical History:  Diagnosis Date  . Arthritis   . Asthma   . COPD (chronic obstructive pulmonary disease) ([place])   . Diabetes mellitus    Type 2  . Diabetic retinopathy ([place])    NPDR OU  . GERD (gastroesophageal reflux disease)   . H/O hiatal hernia   . History of kidney stones   . Hyperlipemia   . Hypertension   . Hypertensive retinopathy    OU  . Nasal polyps   . Pneumonia 2014  . Shortness of breath   . Sleep apnea    pt has CPAP but is unable to use it d/t having polyps in his nose. Pt stated "I need to call and get a CPAP mask instead"  . Stomach ulcer    Past Surgical History:  Procedure Laterality Date  . CATARACT EXTRACTION Right 07/05/2018   Dr. Gershon Crane  . CATARACT EXTRACTION Left 07/12/2018   Dr. Gershon Crane  . CHOLECYSTECTOMY N/A 01/23/2015   Procedure: LAPAROSCOPIC CHOLECYSTECTOMY ;  Surgeon: Georganna Skeans, MD;  Location: [facility];  Service: General;  Laterality: N/A;  . COLONOSCOPY W/ POLYPECTOMY    . ESOPHAGOGASTRODUODENOSCOPY    . EYE SURGERY    . KNEE ARTHROSCOPY  03/24/2011   Procedure: ARTHROSCOPY KNEE;  Surgeon: Alta Corning, MD;  Location: [facility];  Service: Orthopedics;  Laterality: Right;  partial medial menisectomy, chondroplaty patella, and medial medial plica    FAMILY HISTORY Family History  Problem Relation Age of Onset  . Diabetes Father     SOCIAL HISTORY Social History   Tobacco Use  . Smoking status: Never Smoker  . Smokeless tobacco: Current User    Types: Chew  Substance Use Topics  . Alcohol use: Yes    Comment: social, 1x weekly  . Drug use: No         OPHTHALMIC EXAM:  Base Eye Exam    Visual Acuity (Snellen - Linear)      Right Left   Dist [place] 20/40 20/150 -1   Dist ph [place] 20/30 20/100 -2  OS vision looking to the side.       Tonometry (Tonopen, 2:43 PM)      Right Left   Pressure 19 18       Tonometry #2 (Tonopen, 3:35 PM)      Right Left   Pressure 32 32       Tonometry #3 (Tonopen, 3:50 PM)      Right Left   Pressure 20 19       Tonometry Comments   T2 and T3 are post-injection IOP's. T3 was taken 10 minutes after 1 drop of cosopt and brimonidine given OU per Dr. Coralyn Pear.       Pupils      Dark Light Shape React APD   Right 3 2 Round Brisk None   Left 3 2 Round Brisk None       Visual Fields (Counting fingers)      Left Right    Full Full       Extraocular Movement      Right Left    Full, Ortho Full, Ortho       Neuro/Psych    Oriented x3: Yes   Mood/Affect: Normal       Dilation    Both eyes: 1.0% Mydriacyl, 2.5% Phenylephrine @ 2:43 PM        Slit Lamp and Fundus Exam    Slit Lamp Exam      Right Left   Lids/Lashes Dermatochalasis - upper lid, Telangiectasia, mild Meibomian gland dysfunction Dermatochalasis - upper lid, Telangiectasia, mild Meibomian gland dysfunction   Conjunctiva/Sclera White and quiet, +concretions on palpebral conj Mild nasal Pinguecula, +concretions on palpebral conj   Cornea Mild Arcus, Well healed temporal cataract wounds Arcus, 1+ Descemet's folds, Well healed temporal cataract  wounds   Anterior Chamber 1+ cell/pigment deep, 1-2+ cell/pigment   Iris Round and dilated, mild patch of atrophy at 0800, no NVI Round and dilated, No NVI   Lens PC IOL in good position PC IOL in good position   Vitreous Vitreous syneresis Vitreous syneresis       Fundus Exam      Right Left   Disc Pink and Sharp Pink and Sharp   C/D Ratio 0.3 0.3   Macula Blunted foveal reflex, +cystic edema / IRF -- improved, scattered MA--improved, no exudate Blunted foveal reflex, massive central edema -- improving, scattered DBH and MAs, puncatate exudates inferior to fovea - improving   Vessels Vascular attenuation, Tortuous Vascular attenuation, Tortuous   Periphery Attached, scattered MA Attached, pigmented choroidal nevus superiorly with mild elevation, +drusen, no SRF          IMAGING AND PROCEDURES  Imaging and Procedures for @TODAY @  OCT, Retina - OU - Both Eyes       Right Eye Quality was good. Central Foveal Thickness: 364. Progression has improved. Findings include abnormal foveal contour, intraretinal hyper-reflective material, intraretinal fluid, subretinal fluid, outer retinal atrophy (Interval improvement in IRF/foveal profile).   Left Eye Quality was good. Central Foveal Thickness: 711. Progression has improved. Findings include intraretinal fluid, intraretinal hyper-reflective material, subretinal fluid, abnormal foveal contour (mild interval improvement in IRF - still severe, central edema remains severe, Sub-RPE hyper-reflective mass consistent with choroidal nevus--superior to disc, caught on widefield -- stable from prior).   Notes *Images captured and stored on drive  Diagnosis / Impression: Severe DME OU OD: Interval improvement in IRF/foveal profile OS: Mild interval improvement in IRF -- still severe; Sub RPE hyper-reflective mass consistent with choroidal nevus, superior  to disc caught on widefield -- stable from prior   Clinical management:  See  below  Abbreviations: NFP - Normal foveal profile. CME - cystoid macular edema. PED - pigment epithelial detachment. IRF - intraretinal fluid. SRF - subretinal fluid. EZ - ellipsoid zone. ERM - epiretinal membrane. ORA - outer retinal atrophy. ORT - outer retinal tubulation. SRHM - subretinal hyper-reflective material        Intravitreal Injection, Pharmacologic Agent - OD - Right Eye       Time Out 01/12/2019. 3:05 PM. Confirmed correct patient, procedure, site, and patient consented.   Anesthesia Topical anesthesia was used. Anesthetic medications included Lidocaine 2%, Proparacaine 0.5%.   Procedure Preparation included 5% betadine to ocular surface, eyelid speculum. A supplied (32g) needle was used.   Injection:  2 mg aflibercept Alfonse Flavors) SOLN   NDC: O5083423, Lot: [id number], Expiration date: 06/28/2019   Route: Intravitreal, Site: Right Eye, Waste: 0.05 mL  Post-op Post injection exam found visual acuity of at least counting fingers. The patient tolerated the procedure well. There were no complications. The patient received written and verbal post procedure care education.        Intravitreal Injection, Pharmacologic Agent - OS - Left Eye       Time Out 01/12/2019. 3:06 PM. Confirmed correct patient, procedure, site, and patient consented.   Anesthesia Topical anesthesia was used. Anesthetic medications included Lidocaine 2%, Proparacaine 0.5%.   Procedure Preparation included 5% betadine to ocular surface, eyelid speculum. A supplied (32g) needle was used.   Injection:  2 mg aflibercept Alfonse Flavors) SOLN   NDC: O5083423, Lot: [id number], Expiration date: 07/29/2019   Route: Intravitreal, Site: Left Eye, Waste: 0.05 mL  Post-op Post injection exam found visual acuity of at least counting fingers. The patient tolerated the procedure well. There were no complications. The patient received written and verbal post procedure care education.                  ASSESSMENT/PLAN:    ICD-10-CM   1. Severe nonproliferative diabetic retinopathy of both eyes with macular edema associated with type 2 diabetes mellitus (HCC)  [id number] Intravitreal Injection, Pharmacologic Agent - OD - Right Eye    Intravitreal Injection, Pharmacologic Agent - OS - Left Eye    aflibercept (EYLEA) SOLN 2 mg    aflibercept (EYLEA) SOLN 2 mg  2. 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 Essential hypertension  I10   5. Hypertensive retinopathy of both eyes  H35.033   6. Pseudophakia of both eyes  Z96.1     1. Severe nonproliferative diabetic retinopathy with DME, OU (OS>OD)  - s/p IVA OD #1 (06.26.20), #2 (08.14.20), #3 (09.17.20), #4 (10.16.20)  - s/p IVA OS #1 (07.17.20), #2 (08.14.20), #3 (09.17.20)  - s/p IVA OU (08.14.20), #3 (09.17.20)  - s/p IVE OS #1 (10.16.20) - sample, #2 (11.13.20)  - s/p IVE OD #1 (11.13.20)  - FA (06.26.20) shows Severe NPDR OU w/ Late leaking MA OU, Enlarged FAZ OU, No NV OU, No significant hyperfluorescence of disc  - BCVA improved OU: OD 20/30; OS 20/100  - OCT shows interval improvement in IRF/foveal profile OD, interval improvement in IRF OS -- still severe  - recommend treating both eyes with Eylea today  - recommend IVE OD #2 and IVE #3 OS today, 12.11.20  - pt wishes to proceed  - RBA of procedure discussed, questions answered  -  informed consent obtained and signed  - see procedure note  - [id number] Benefits Investigation initiated 10.16.2020  - f/u in 4 weeks -- DFE/OCT/possible injection OU  2. Retinal Edema OU  - based on FA, suspect majority of macular edema due to DM as above, but there may be some edema secondary to post-op CME  - cont PF qid OU and ketorolac qid OU -- pt is only using bid -- difficulty with compliance at work  - monitor  - f/u 4 wks  3. Choroidal Nevus OS  - located at 1200, mild elevation, +drusen, no SRF  - baseline optos pictures obtained,  06.26.20  - discussed possible referral to ocular oncologist at [facility] for evaluation/management  4,5.Hypertensive retinopathy OU  - discussed importance of tight BP control  - monitor  6. Pseudophakia OU  - s/p CE/IOL OU (OD on 06.03.20 and OS 06.10.20 by Dr. Gershon Crane)  - beautiful surgeries w/ IOLs in excellent position  - macular edema limiting vision as above  - monitor   Ophthalmic Meds Ordered this visit:  Meds ordered this encounter  Medications  . aflibercept (EYLEA) SOLN 2 mg  . aflibercept (EYLEA) SOLN 2 mg      Return in about 4 weeks (around 02/09/2019) for f/u NPDR OU, DFE, OCT.  There are no Patient Instructions on file for this visit.   Explained the diagnoses, plan, and follow up with the patient and they expressed understanding.  Patient expressed understanding of the importance of proper follow up care.   This document serves as a record of services personally performed by Gardiner Sleeper, MD, PhD. It was created on their behalf by Ernest Mallick, OA, an ophthalmic assistant. The creation of this record is the provider's dictation and/or activities during the visit.    Electronically signed by: Ernest Mallick, OA 12.11.2020 10:53 PM  Gardiner Sleeper, M.D., Ph.D. Diseases & Surgery of the Retina and Vitreous [facility]  I have reviewed the above documentation for accuracy and completeness, and I agree with the above. Gardiner Sleeper, M.D., Ph.D. 01/13/19 10:53 PM   Abbreviations: M myopia (nearsighted); A astigmatism; H hyperopia (farsighted); P presbyopia; Mrx spectacle prescription;  CTL contact lenses; OD right eye; OS left eye; OU both eyes  XT exotropia; ET esotropia; PEK punctate epithelial keratitis; PEE punctate epithelial erosions; DES dry eye syndrome; MGD meibomian gland dysfunction; ATs artificial tears; PFAT's preservative free artificial tears; [place] nuclear sclerotic cataract; PSC posterior subcapsular cataract; ERM  epi-retinal membrane; PVD posterior vitreous detachment; RD retinal detachment; DM diabetes mellitus; DR diabetic retinopathy; NPDR non-proliferative diabetic retinopathy; PDR proliferative diabetic retinopathy; CSME clinically significant macular edema; DME diabetic macular edema; dbh dot blot hemorrhages; CWS cotton wool spot; POAG primary open angle glaucoma; C/D cup-to-disc ratio; HVF humphrey visual field; GVF goldmann visual field; OCT optical coherence tomography; IOP intraocular pressure; BRVO Branch retinal vein occlusion; CRVO central retinal vein occlusion; CRAO central retinal artery occlusion; BRAO branch retinal artery occlusion; RT retinal tear; SB scleral buckle; PPV pars plana vitrectomy; VH Vitreous hemorrhage; PRP panretinal laser photocoagulation; IVK intravitreal kenalog; VMT vitreomacular traction; MH Macular hole;  NVD neovascularization of the disc; NVE neovascularization elsewhere; AREDS age related eye disease study; ARMD age related macular degeneration; POAG primary open angle glaucoma; EBMD epithelial/anterior basement membrane dystrophy; ACIOL anterior chamber intraocular lens; IOL intraocular lens; PCIOL posterior chamber intraocular lens; Phaco/IOL phacoemulsification with intraocular lens placement; PRK photorefractive keratectomy; LASIK laser assisted in situ keratomileusis; HTN  hypertension; DM diabetes mellitus; COPD chronic obstructive pulmonary disease

## 2019-01-13 ENCOUNTER — Encounter (INDEPENDENT_AMBULATORY_CARE_PROVIDER_SITE_OTHER): Payer: Self-pay | Admitting: Ophthalmology

## 2019-01-13 MED ORDER — AFLIBERCEPT 2MG/0.05ML IZ SOLN FOR KALEIDOSCOPE
2.0000 mg | INTRAVITREAL | Status: AC | PRN
  Administered 2019-01-13: 23:00:00 2 mg via INTRAVITREAL

## 2019-01-13 MED ORDER — AFLIBERCEPT 2MG/0.05ML IZ SOLN FOR KALEIDOSCOPE
2.0000 mg | INTRAVITREAL | Status: AC | PRN
  Administered 2019-01-13: 2 mg via INTRAVITREAL

## 2019-01-23 ENCOUNTER — Other Ambulatory Visit (INDEPENDENT_AMBULATORY_CARE_PROVIDER_SITE_OTHER): Payer: Self-pay | Admitting: Ophthalmology

## 2019-02-09 ENCOUNTER — Encounter (INDEPENDENT_AMBULATORY_CARE_PROVIDER_SITE_OTHER): Payer: BC Managed Care – PPO | Admitting: Ophthalmology

## 2019-02-14 NOTE — Progress Notes (Signed)
[facility] Note  02/19/2019     CHIEF COMPLAINT Patient presents for Retina Follow Up   HISTORY OF PRESENT ILLNESS: Juan [name] is a 59 y.o. male who presents to the clinic today for:   HPI    Retina Follow Up    Patient presents with  Diabetic Retinopathy.  In both eyes.  This started months ago.  Severity is moderate.  Duration of 5.5 weeks.  Since onset it is stable.  I, the attending physician,  performed the HPI with the patient and updated documentation appropriately.          Comments    59 y/o male pt here for 5.5 wk f/u for severe NPDR w/mac edema OU.  No major change in [place] OU, but "dark spot" in cental vision OS has gone away.  Denies pain, flashes, floaters.  Pred & Ketorolac bid OU.  BS 118 this a.m.  A1C was under 7 about a yr ago.       Last edited by Bernarda Caffey, MD on 02/19/2019  5:39 PM. (History)    Patient feels like his left eye is doing better, he states he can see shapes and colors better than before, he is still using PF and ketorolac  Referring physician: Harlan Stains, MD [place],   [id number]  HISTORICAL INFORMATION:   Selected notes from the MEDICAL RECORD NUMBER Referred by Dr. Rutherford Guys for concern of CME s/p cataract sx LEE:  Ocular Hx- PMH-   CURRENT MEDICATIONS: Current Outpatient Medications (Ophthalmic Drugs)  Medication Sig  . ketorolac (ACULAR) 0.5 % ophthalmic solution Place 1 drop into both eyes 4 (four) times daily.  . prednisoLONE acetate (PRED FORTE) 1 % ophthalmic suspension INSTILL 1 DROP INTO BOTH EYES 4 TIMES A DAY  . ofloxacin (OCUFLOX) 0.3 % ophthalmic solution Place 1 drop into both eyes 3 (three) times daily.   No current facility-administered medications for this visit. (Ophthalmic Drugs)   Current Outpatient Medications (Other)  Medication Sig  . ADVAIR DISKUS 500-50 MCG/DOSE AEPB Inhale 1 puff into the lungs 2 (two) times daily.  Marland Kitchen albuterol  (PROVENTIL HFA;VENTOLIN HFA) 108 (90 BASE) MCG/ACT inhaler Inhale 1-2 puffs into the lungs every 6 (six) hours as needed for wheezing or shortness of breath.   Marland Kitchen albuterol (PROVENTIL) (2.5 MG/3ML) 0.083% nebulizer solution INHALE CONTENTS OF ONE VIAL IN NEBULIZER THREE TIMES DAILY AS NEEDED  . alfuzosin (UROXATRAL) 10 MG 24 hr tablet   . aspirin EC 81 MG tablet Take 81 mg by mouth daily.  Marland Kitchen atorvastatin (LIPITOR) 40 MG tablet Take 40 mg by mouth daily.  Marland Kitchen azithromycin (ZITHROMAX) 250 MG tablet Take 1 tablet (250 mg total) by mouth daily. Take first 2 tablets together, then 1 every day until finished.  . benzonatate (TESSALON) 100 MG capsule Take 1 capsule (100 mg total) by mouth every 8 (eight) hours.  . Empagliflozin-metFORMIN HCl (SYNJARDY) 12.06-998 MG TABS Take by mouth 2 (two) times a day.  . esomeprazole (NEXIUM) 40 MG capsule Take 40 mg by mouth daily at 12 noon.  Marland Kitchen glimepiride (AMARYL) 4 MG tablet Take 4 mg by mouth 2 (two) times daily.  Marland Kitchen HYDROMET 5-1.5 MG/5ML syrup TAKE ONE TEASPOONFUL BY MOUTH EVERY 4 TO 6 HOURS AS NEEDED FOR COUGH  . KOMBIGLYZE XR 2.06-998 MG TB24 Take 2 tablets by mouth at bedtime.   Marland Kitchen LANTUS SOLOSTAR 100 UNIT/ML Solostar Pen Inject 42 Units into the  skin at bedtime.   Marland Kitchen lisinopril (ZESTRIL) 40 MG tablet Take 40 mg by mouth daily.  Marland Kitchen lisinopril-hydrochlorothiazide (PRINZIDE,ZESTORETIC) 20-25 MG per tablet Take 1 tablet by mouth daily.  . Multiple Vitamins-Minerals (CENTRUM SILVER 50+MEN PO) as directed.  . nitrofurantoin, macrocrystal-monohydrate, (MACROBID) 100 MG capsule Take 100 mg by mouth 2 (two) times daily.  Marland Kitchen oxyCODONE (OXY IR/ROXICODONE) 5 MG immediate release tablet Take 1-2 tablets (5-10 mg total) by mouth every 4 (four) hours as needed (83m for moderate pain, 170mfor severe pain).  . Marland Kitchenulfamethoxazole-trimethoprim (BACTRIM DS) 800-160 MG tablet Take 1 tablet by mouth 2 (two) times daily.  . Marland Kitchentorvastatin (LIPITOR) 40 MG tablet Take 40 mg by mouth daily.  .  predniSONE (DELTASONE) 20 MG tablet 3 tabs po day one, then 2 po daily x 4 days (Patient not taking: Reported on 02/19/2019)   No current facility-administered medications for this visit. (Other)      REVIEW OF SYSTEMS: ROS    Positive for: Endocrine, Eyes   Negative for: Constitutional, Gastrointestinal, Neurological, Skin, Genitourinary, Musculoskeletal, HENT, Cardiovascular, Respiratory, Psychiatric, Allergic/Imm, Heme/Lymph   Last edited by BaRoselee Nova, COT on 02/19/2019  3:14 PM. (History)       ALLERGIES Allergies  Allergen Reactions  . Naproxen Sodium Shortness Of Breath  . Penicillins Rash    Has patient had a PCN reaction causing immediate rash, facial/tongue/throat swelling, SOB or lightheadedness with hypotension: Yes Has patient had a PCN reaction causing severe rash involving mucus membranes or skin necrosis: No Has patient had a PCN reaction that required hospitalization No Has patient had a PCN reaction occurring within the last 10 years: No If all of the above answers are "NO", then may proceed with Cephalosporin use.     PAST MEDICAL HISTORY Past Medical History:  Diagnosis Date  . Arthritis   . Asthma   . COPD (chronic obstructive pulmonary disease) ([facility]  . Diabetes mellitus    Type 2  . Diabetic retinopathy ([place]   NPDR OU  . GERD (gastroesophageal reflux disease)   . H/O hiatal hernia   . History of kidney stones   . Hyperlipemia   . Hypertension   . Hypertensive retinopathy    OU  . Nasal polyps   . Pneumonia 2014  . Shortness of breath   . Sleep apnea    pt has CPAP but is unable to use it d/t having polyps in his nose. Pt stated "I need to call and get a CPAP mask instead"  . Stomach ulcer    Past Surgical History:  Procedure Laterality Date  . CATARACT EXTRACTION Right 07/05/2018   Dr. ShGershon Crane. CATARACT EXTRACTION Left 07/12/2018   Dr. ShGershon Crane. CHOLECYSTECTOMY N/A 01/23/2015   Procedure: LAPAROSCOPIC CHOLECYSTECTOMY ;   Surgeon: BuGeorganna SkeansMD;  [facility] Service: General;  Laterality: N/A;  . COLONOSCOPY W/ POLYPECTOMY    . ESOPHAGOGASTRODUODENOSCOPY    . EYE SURGERY    . KNEE ARTHROSCOPY  03/24/2011   Procedure: ARTHROSCOPY KNEE;  Surgeon: JoAlta CorningMD;  [facility] Service: Orthopedics;  Laterality: Right;  partial medial menisectomy, chondroplaty patella, and medial medial plica    FAMILY HISTORY Family History  Problem Relation Age of Onset  . Diabetes Father     SOCIAL HISTORY Social History   Tobacco Use  . Smoking status: Never Smoker  . Smokeless tobacco: Current User    Types: Chew  Substance Use Topics  .  Alcohol use: Yes    Comment: social, 1x weekly  . Drug use: No         OPHTHALMIC EXAM:  Base Eye Exam    Visual Acuity (Snellen - Linear)      Right Left   Dist [place] 20/40 20/150 -2   Dist ph [place] 20/30 20/100 -2       Tonometry (Tonopen, 3:30 PM)      Right Left   Pressure 22 15       Pupils      Dark Light Shape React APD   Right 3 2 Round Brisk None   Left 3 2 Round Brisk None       Visual Fields (Counting fingers)      Left Right    Full Full       Extraocular Movement      Right Left    Full, Ortho Full, Ortho       Neuro/Psych    Oriented x3: Yes   Mood/Affect: Normal       Dilation    Both eyes: 1.0% Mydriacyl, 2.5% Phenylephrine @ 3:30 PM        Slit Lamp and Fundus Exam    Slit Lamp Exam      Right Left   Lids/Lashes Dermatochalasis - upper lid, Telangiectasia, mild Meibomian gland dysfunction Dermatochalasis - upper lid, Telangiectasia, mild Meibomian gland dysfunction   Conjunctiva/Sclera White and quiet, +concretions on palpebral conj Mild nasal Pinguecula, +concretions on palpebral conj   Cornea Mild Arcus, Well healed temporal cataract wounds Arcus, 1+ Descemet's folds, Well healed temporal cataract wounds   Anterior Chamber 1+ cell/pigment deep, 1-2+ cell/pigment   Iris Round and dilated,  mild patch of atrophy at 0800, no NVI Round and dilated, No NVI   Lens PC IOL in good position PC IOL in good position   Vitreous Vitreous syneresis Vitreous syneresis       Fundus Exam      Right Left   Disc Pink and Sharp Pink and Sharp   C/D Ratio 0.3 0.3   Macula Blunted foveal reflex, +cystic edema / IRF -- improved, scattered MA--improved, no exudate Blunted foveal reflex, massive central edema -- improving, scattered DBH and MAs, puncatate exudates inferior to fovea - improving   Vessels Vascular attenuation, Tortuous Vascular attenuation, Tortuous   Periphery Attached, scattered MA Attached, pigmented choroidal nevus superiorly with mild elevation, +drusen, no SRF          IMAGING AND PROCEDURES  Imaging and Procedures for _0 @  OCT, Retina - OU - Both Eyes       Right Eye Quality was good. Central Foveal Thickness: 358. Progression has improved. Findings include abnormal foveal contour, intraretinal hyper-reflective material, intraretinal fluid, subretinal fluid, outer retinal atrophy (Mild Interval improvement in IRF).   Left Eye Quality was good. Central Foveal Thickness: 657. Progression has improved. Findings include intraretinal fluid, intraretinal hyper-reflective material, subretinal fluid, abnormal foveal contour (mild interval improvement in IRF - still severe, central edema remains severe, Sub-RPE hyper-reflective mass consistent with choroidal nevus--superior to disc, caught on widefield -- stable from prior).   Notes *Images captured and stored on drive  Diagnosis / Impression: Severe DME OU OD: mild Interval improvement in IRF OS: Mild interval improvement in IRF -- still severe; Sub RPE hyper-reflective mass consistent with choroidal nevus, superior to disc caught on widefield -- stable from prior   Clinical management:  See below  Abbreviations: NFP - Normal foveal profile. CME - cystoid  macular edema. PED - pigment epithelial detachment. IRF -  intraretinal fluid. SRF - subretinal fluid. EZ - ellipsoid zone. ERM - epiretinal membrane. ORA - outer retinal atrophy. ORT - outer retinal tubulation. SRHM - subretinal hyper-reflective material        Intravitreal Injection, Pharmacologic Agent - OD - Right Eye       Time Out 02/19/2019. 4:00 PM. Confirmed correct patient, procedure, site, and patient consented.   Anesthesia Topical anesthesia was used. Anesthetic medications included Lidocaine 2%, Proparacaine 0.5%.   Procedure Preparation included 5% betadine to ocular surface, eyelid speculum. A supplied (32 g) needle was used.   Injection:  2 mg aflibercept Alfonse Flavors) SOLN   NDC: A3590391, Lot: [id number], Expiration date: 08/01/2019   Route: Intravitreal, Site: Right Eye, Waste: 0.05 mL  Post-op Post injection exam found visual acuity of at least counting fingers. The patient tolerated the procedure well. There were no complications. The patient received written and verbal post procedure care education.   Notes An AC tap was performed following injection due to elevated IOP using a 30 gauge needle on a syringe with the plunger removed. The needle was placed at the limbus at 5 oclock and approximately 0.03 cc of aqueous was removed from the anterior chamber. Betadine was applied to the tap area before and after the paracentesis was performed. There were no complications. The patient tolerated the procedure well. The IOP was rechecked and was found to be 12 mmHg by palpation.        Intravitreal Injection, Pharmacologic Agent - OS - Left Eye       Time Out 02/19/2019. 4:01 PM. Confirmed correct patient, procedure, site, and patient consented.   Anesthesia Topical anesthesia was used. Anesthetic medications included Lidocaine 2%, Proparacaine 0.5%.   Procedure Preparation included 5% betadine to ocular surface, eyelid speculum. A supplied (32 g) needle was used.   Injection:  2 mg aflibercept Alfonse Flavors) SOLN   NDC:  A3590391, Lot: [id number], Expiration date: 08/01/2019   Route: Intravitreal, Site: Left Eye, Waste: 0.05 mL  Post-op Post injection exam found visual acuity of at least counting fingers. The patient tolerated the procedure well. There were no complications. The patient received written and verbal post procedure care education.                 ASSESSMENT/PLAN:    ICD-10-CM   1. Severe nonproliferative diabetic retinopathy of both eyes with macular edema associated with type 2 diabetes mellitus (HCC)  D22.0254 Intravitreal Injection, Pharmacologic Agent - OD - Right Eye    Intravitreal Injection, Pharmacologic Agent - OS - Left Eye    aflibercept (EYLEA) SOLN 2 mg    aflibercept (EYLEA) SOLN 2 mg  2. 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 Essential hypertension  I10   5. Hypertensive retinopathy of both eyes  H35.033   6. Pseudophakia of both eyes  Z96.1     1. Severe nonproliferative diabetic retinopathy with DME, OU (OS>OD)  - s/p IVA OD #1 (06.26.20), #2 (08.14.20), #3 (09.17.20), #4 (10.16.20)  - s/p IVA OS #1 (07.17.20), #2 (08.14.20), #3 (09.17.20)  - s/p IVA OU (08.14.20), #3 (09.17.20)  - s/p IVE OS #1 (10.16.20) - sample, #2 (11.13.20), #3 (12.11.20)  - s/p IVE OD #1 (11.13.20), #2 (12.11.20)  - FA (06.26.20) shows Severe NPDR OU w/ Late leaking MA OU, Enlarged FAZ OU, No NV OU, No significant hyperfluorescence  of disc  - BCVA stable OU: OD 20/30; OS 20/100  - OCT shows mild interval improvement in IRF OD, mild interval improvement in IRF OS -- still severe  - recommend treating both eyes with Eylea today  - recommend IVE OD #3 and IVE #4 OS today, 01.18.21  - pt wishes to proceed  - RBA of procedure discussed, questions answered  - informed consent obtained and signed  - see procedure note -- AC tap OD  - Eylea4U Benefits Investigation initiated 10.16.2020 -- approved for 2021  - f/u in 4 weeks --  DFE/OCT/possible injection OU  2. Retinal Edema OU  - based on FA, suspect majority of macular edema due to DM as above, but there may be some edema secondary to post-op CME  - cont PF qid OU and ketorolac qid OU -- pt is only using bid -- difficulty with compliance at work  - monitor  - f/u 4 wks  3. Choroidal Nevus OS  - located at 1200, mild elevation, +drusen, no SRF  - baseline optos pictures obtained, 06.26.20  - discussed possible referral to ocular oncologist at [facility] for evaluation/management  4,5.Hypertensive retinopathy OU  - discussed importance of tight BP control  - monitor  6. Pseudophakia OU  - s/p CE/IOL OU (OD on 06.03.20 and OS 06.10.20 by Dr. Gershon Crane)  - beautiful surgeries w/ IOLs in excellent position  - macular edema limiting vision as above  - monitor  7. Ocular hypertension OD  - IOP 22 OD, up from 20 last visit  - will start Cosopt BID OD -- bottle given from clinic  - monitor   Ophthalmic Meds Ordered this visit:  Meds ordered this encounter  Medications  . aflibercept (EYLEA) SOLN 2 mg  . aflibercept (EYLEA) SOLN 2 mg      Return in about 4 weeks (around 03/19/2019) for f/u NPDR OU, DFE, OCT.  There are no Patient Instructions on file for this visit.   Explained the diagnoses, plan, and follow up with the patient and they expressed understanding.  Patient expressed understanding of the importance of proper follow up care.   This document serves as a record of services personally performed by Gardiner Sleeper, MD, PhD. It was created on their behalf by Leeann Must, [place], a certified ophthalmic assistant. The creation of this record is the provider's dictation and/or activities during the visit.    Electronically signed by: Leeann Must, COA _0 @ 6:04 PM   This document serves as a record of services personally performed by Gardiner Sleeper, MD, PhD. It was created on their behalf by Ernest Mallick, OA, an ophthalmic assistant. The  creation of this record is the provider's dictation and/or activities during the visit.    Electronically signed by: Ernest Mallick, OA  6:04 PM  Gardiner Sleeper, M.D., Ph.D. Diseases & Surgery of the Retina and Vitreous [facility]  I have reviewed the above documentation for accuracy and completeness, and I agree with the above. Gardiner Sleeper, M.D., Ph.D. 02/19/19 6:04 PM    Abbreviations: M myopia (nearsighted); A astigmatism; H hyperopia (farsighted); P presbyopia; Mrx spectacle prescription;  CTL contact lenses; OD right eye; OS left eye; OU both eyes  XT exotropia; ET esotropia; PEK punctate epithelial keratitis; PEE punctate epithelial erosions; DES dry eye syndrome; MGD meibomian gland dysfunction; ATs artificial tears; PFAT's preservative free artificial tears; [place] nuclear sclerotic cataract; PSC posterior subcapsular cataract; ERM epi-retinal membrane; PVD posterior  vitreous detachment; RD retinal detachment; DM diabetes mellitus; DR diabetic retinopathy; NPDR non-proliferative diabetic retinopathy; PDR proliferative diabetic retinopathy; CSME clinically significant macular edema; DME diabetic macular edema; dbh dot blot hemorrhages; CWS cotton wool spot; POAG primary open angle glaucoma; C/D cup-to-disc ratio; HVF humphrey visual field; GVF goldmann visual field; OCT optical coherence tomography; IOP intraocular pressure; BRVO Branch retinal vein occlusion; CRVO central retinal vein occlusion; CRAO central retinal artery occlusion; BRAO branch retinal artery occlusion; RT retinal tear; SB scleral buckle; PPV pars plana vitrectomy; VH Vitreous hemorrhage; PRP panretinal laser photocoagulation; IVK intravitreal kenalog; VMT vitreomacular traction; MH Macular hole;  NVD neovascularization of the disc; NVE neovascularization elsewhere; AREDS age related eye disease study; ARMD age related macular degeneration; POAG primary open angle glaucoma; EBMD epithelial/anterior  basement membrane dystrophy; ACIOL anterior chamber intraocular lens; IOL intraocular lens; PCIOL posterior chamber intraocular lens; Phaco/IOL phacoemulsification with intraocular lens placement; [place] photorefractive keratectomy; LASIK laser assisted in situ keratomileusis; HTN hypertension; DM diabetes mellitus; COPD chronic obstructive pulmonary disease

## 2019-02-16 DIAGNOSIS — Z0000 Encounter for general adult medical examination without abnormal findings: Secondary | ICD-10-CM | POA: Diagnosis not present

## 2019-02-16 DIAGNOSIS — E785 Hyperlipidemia, unspecified: Secondary | ICD-10-CM | POA: Diagnosis not present

## 2019-02-16 DIAGNOSIS — G4733 Obstructive sleep apnea (adult) (pediatric): Secondary | ICD-10-CM | POA: Diagnosis not present

## 2019-02-16 DIAGNOSIS — I10 Essential (primary) hypertension: Secondary | ICD-10-CM | POA: Diagnosis not present

## 2019-02-16 DIAGNOSIS — Z125 Encounter for screening for malignant neoplasm of prostate: Secondary | ICD-10-CM | POA: Diagnosis not present

## 2019-02-19 ENCOUNTER — Other Ambulatory Visit: Payer: Self-pay

## 2019-02-19 ENCOUNTER — Encounter (INDEPENDENT_AMBULATORY_CARE_PROVIDER_SITE_OTHER): Payer: Self-pay | Admitting: Ophthalmology

## 2019-02-19 ENCOUNTER — Ambulatory Visit (INDEPENDENT_AMBULATORY_CARE_PROVIDER_SITE_OTHER): Payer: BC Managed Care – PPO | Admitting: Ophthalmology

## 2019-02-19 DIAGNOSIS — Z961 Presence of intraocular lens: Secondary | ICD-10-CM

## 2019-02-19 DIAGNOSIS — D3132 Benign neoplasm of left choroid: Secondary | ICD-10-CM

## 2019-02-19 DIAGNOSIS — E113413 Type 2 diabetes mellitus with severe nonproliferative diabetic retinopathy with macular edema, bilateral: Secondary | ICD-10-CM | POA: Diagnosis not present

## 2019-02-19 DIAGNOSIS — I10 Essential (primary) hypertension: Secondary | ICD-10-CM | POA: Diagnosis not present

## 2019-02-19 DIAGNOSIS — H3581 Retinal edema: Secondary | ICD-10-CM | POA: Diagnosis not present

## 2019-02-19 DIAGNOSIS — H35033 Hypertensive retinopathy, bilateral: Secondary | ICD-10-CM

## 2019-02-19 MED ORDER — AFLIBERCEPT 2MG/0.05ML IZ SOLN FOR KALEIDOSCOPE
2.0000 mg | INTRAVITREAL | Status: AC | PRN
  Administered 2019-02-19: 18:00:00 2 mg via INTRAVITREAL

## 2019-02-19 MED ORDER — AFLIBERCEPT 2MG/0.05ML IZ SOLN FOR KALEIDOSCOPE
2.0000 mg | INTRAVITREAL | Status: AC | PRN
  Administered 2019-02-19: 2 mg via INTRAVITREAL

## 2019-02-23 DIAGNOSIS — E785 Hyperlipidemia, unspecified: Secondary | ICD-10-CM | POA: Diagnosis not present

## 2019-02-23 DIAGNOSIS — E1169 Type 2 diabetes mellitus with other specified complication: Secondary | ICD-10-CM | POA: Diagnosis not present

## 2019-02-23 DIAGNOSIS — Z125 Encounter for screening for malignant neoplasm of prostate: Secondary | ICD-10-CM | POA: Diagnosis not present

## 2019-02-23 DIAGNOSIS — Z1159 Encounter for screening for other viral diseases: Secondary | ICD-10-CM | POA: Diagnosis not present

## 2019-02-23 DIAGNOSIS — Z0000 Encounter for general adult medical examination without abnormal findings: Secondary | ICD-10-CM | POA: Diagnosis not present

## 2019-02-28 DIAGNOSIS — D649 Anemia, unspecified: Secondary | ICD-10-CM | POA: Diagnosis not present

## 2019-02-28 DIAGNOSIS — K219 Gastro-esophageal reflux disease without esophagitis: Secondary | ICD-10-CM | POA: Diagnosis not present

## 2019-03-18 ENCOUNTER — Other Ambulatory Visit (INDEPENDENT_AMBULATORY_CARE_PROVIDER_SITE_OTHER): Payer: Self-pay | Admitting: Ophthalmology

## 2019-03-20 NOTE — Progress Notes (Signed)
[facility] Note  03/23/2019     CHIEF COMPLAINT Patient presents for Retina Follow Up   HISTORY OF PRESENT ILLNESS: Juan [name] is a 59 y.o. male who presents to the clinic today for:   HPI    Retina Follow Up    Patient presents with  Diabetic Retinopathy.  In both eyes.  This started weeks ago.  Severity is moderate.  Duration of weeks.  Since onset it is stable.  I, the attending physician,  performed the HPI with the patient and updated documentation appropriately.          Comments    Pt states his vision is about the same--states left eye is weak.  Patient denies eye pain or discomfort.       Last edited by Bernarda Caffey, MD on 03/24/2019  2:07 AM. (History)    Patient    Referring physician: Harlan Stains, MD [place],  [place] [address]  HISTORICAL INFORMATION:   Selected notes from the MEDICAL RECORD NUMBER Referred by Dr. Rutherford Guys for concern of CME s/p cataract sx LEE:  Ocular Hx- PMH-   CURRENT MEDICATIONS: Current Outpatient Medications (Ophthalmic Drugs)  Medication Sig  . ketorolac (ACULAR) 0.5 % ophthalmic solution Place 1 drop into both eyes 4 (four) times daily.  Marland Kitchen ofloxacin (OCUFLOX) 0.3 % ophthalmic solution Place 1 drop into both eyes 3 (three) times daily.  . prednisoLONE acetate (PRED FORTE) 1 % ophthalmic suspension INSTILL 1 DROP INTO BOTH EYES 4 TIMES A DAY   No current facility-administered medications for this visit. (Ophthalmic Drugs)   Current Outpatient Medications (Other)  Medication Sig  . ADVAIR DISKUS 500-50 MCG/DOSE AEPB Inhale 1 puff into the lungs 2 (two) times daily.  Marland Kitchen albuterol (PROVENTIL HFA;VENTOLIN HFA) 108 (90 BASE) MCG/ACT inhaler Inhale 1-2 puffs into the lungs every 6 (six) hours as needed for wheezing or shortness of breath.   Marland Kitchen albuterol (PROVENTIL) (2.5 MG/3ML) 0.083% nebulizer solution INHALE CONTENTS OF ONE VIAL IN NEBULIZER THREE TIMES DAILY AS  NEEDED  . alfuzosin (UROXATRAL) 10 MG 24 hr tablet   . aspirin EC 81 MG tablet Take 81 mg by mouth daily.  Marland Kitchen atorvastatin (LIPITOR) 40 MG tablet Take 40 mg by mouth daily.  Marland Kitchen atorvastatin (LIPITOR) 40 MG tablet Take 40 mg by mouth daily.  Marland Kitchen azithromycin (ZITHROMAX) 250 MG tablet Take 1 tablet (250 mg total) by mouth daily. Take first 2 tablets together, then 1 every day until finished.  . benzonatate (TESSALON) 100 MG capsule Take 1 capsule (100 mg total) by mouth every 8 (eight) hours.  . Empagliflozin-metFORMIN HCl (SYNJARDY) 12.06-998 MG TABS Take by mouth 2 (two) times a day.  . esomeprazole (NEXIUM) 40 MG capsule Take 40 mg by mouth daily at 12 noon.  Marland Kitchen glimepiride (AMARYL) 4 MG tablet Take 4 mg by mouth 2 (two) times daily.  Marland Kitchen HYDROMET 5-1.5 MG/5ML syrup TAKE ONE TEASPOONFUL BY MOUTH EVERY 4 TO 6 HOURS AS NEEDED FOR COUGH  . KOMBIGLYZE XR 2.06-998 MG TB24 Take 2 tablets by mouth at bedtime.   Marland Kitchen LANTUS SOLOSTAR 100 UNIT/ML Solostar Pen Inject 42 Units into the skin at bedtime.   Marland Kitchen lisinopril (ZESTRIL) 40 MG tablet Take 40 mg by mouth daily.  Marland Kitchen lisinopril-hydrochlorothiazide (PRINZIDE,ZESTORETIC) 20-25 MG per tablet Take 1 tablet by mouth daily.  . Multiple Vitamins-Minerals (CENTRUM SILVER 50+MEN PO) as directed.  . nitrofurantoin, macrocrystal-monohydrate, (MACROBID) 100 MG capsule Take  100 mg by mouth 2 (two) times daily.  Marland Kitchen oxyCODONE (OXY IR/ROXICODONE) 5 MG immediate release tablet Take 1-2 tablets (5-10 mg total) by mouth every 4 (four) hours as needed (5mg  for moderate pain, 10mg  for severe pain).  . predniSONE (DELTASONE) 20 MG tablet 3 tabs po day one, then 2 po daily x 4 days (Patient not taking: Reported on 02/19/2019)  . sulfamethoxazole-trimethoprim (BACTRIM DS) 800-160 MG tablet Take 1 tablet by mouth 2 (two) times daily.   No current facility-administered medications for this visit. (Other)      REVIEW OF SYSTEMS: ROS    Positive for: Endocrine, Eyes   Negative for:  Constitutional, Gastrointestinal, Neurological, Skin, Genitourinary, Musculoskeletal, HENT, Cardiovascular, Respiratory, Psychiatric, Allergic/Imm, Heme/Lymph   Last edited by Doneen Poisson on 03/23/2019  1:40 PM. (History)       ALLERGIES Allergies  Allergen Reactions  . Naproxen Sodium Shortness Of Breath  . Penicillins Rash    Has patient had a PCN reaction causing immediate rash, facial/tongue/throat swelling, SOB or lightheadedness with hypotension: Yes Has patient had a PCN reaction causing severe rash involving mucus membranes or skin necrosis: No Has patient had a PCN reaction that required hospitalization No Has patient had a PCN reaction occurring within the last 10 years: No If all of the above answers are "NO", then may proceed with Cephalosporin use.     PAST MEDICAL HISTORY Past Medical History:  Diagnosis Date  . Arthritis   . Asthma   . COPD (chronic obstructive pulmonary disease) ([place])   . Diabetes mellitus    Type 2  . Diabetic retinopathy ([place])    NPDR OU  . GERD (gastroesophageal reflux disease)   . H/O hiatal hernia   . History of kidney stones   . Hyperlipemia   . Hypertension   . Hypertensive retinopathy    OU  . Nasal polyps   . Pneumonia 2014  . Shortness of breath   . Sleep apnea    pt has CPAP but is unable to use it d/t having polyps in his nose. Pt stated "I need to call and get a CPAP mask instead"  . Stomach ulcer    Past Surgical History:  Procedure Laterality Date  . CATARACT EXTRACTION Right 07/05/2018   Dr. Gershon Crane  . CATARACT EXTRACTION Left 07/12/2018   Dr. Gershon Crane  . CHOLECYSTECTOMY N/A 01/23/2015   Procedure: LAPAROSCOPIC CHOLECYSTECTOMY ;  Surgeon: Georganna Skeans, MD;  Location: [facility];  Service: General;  Laterality: N/A;  . COLONOSCOPY W/ POLYPECTOMY    . ESOPHAGOGASTRODUODENOSCOPY    . EYE SURGERY    . KNEE ARTHROSCOPY  03/24/2011   Procedure: ARTHROSCOPY KNEE;  Surgeon: Alta Corning, MD;  Location: [facility];  Service: Orthopedics;  Laterality: Right;  partial medial menisectomy, chondroplaty patella, and medial medial plica    FAMILY HISTORY Family History  Problem Relation Age of Onset  . Diabetes Father     SOCIAL HISTORY Social History   Tobacco Use  . Smoking status: Never Smoker  . Smokeless tobacco: Current User    Types: Chew  Substance Use Topics  . Alcohol use: Yes    Comment: social, 1x weekly  . Drug use: No         OPHTHALMIC EXAM:  Base Eye Exam    Visual Acuity (Snellen - Linear)      Right Left   Dist [place] 20/40 +2 20/150   Dist ph [place] 20/30 -1 NI  Tonometry (Tonopen, 1:43 PM)      Right Left   Pressure 17 20       Pupils      Dark Light Shape React APD   Right 3 2 Round Minimal 0   Left 3 2 Round Minimal 0       Visual Fields      Left Right    Full Full       Extraocular Movement      Right Left    Full Full       Neuro/Psych    Oriented x3: Yes   Mood/Affect: Normal       Dilation    Both eyes: 1.0% Mydriacyl, 2.5% Phenylephrine @ 1:43 PM        Slit Lamp and Fundus Exam    Slit Lamp Exam      Right Left   Lids/Lashes Dermatochalasis - upper lid, Telangiectasia, mild Meibomian gland dysfunction Dermatochalasis - upper lid, Telangiectasia, mild Meibomian gland dysfunction   Conjunctiva/Sclera White and quiet, +concretions on palpebral conj Mild nasal Pinguecula, +concretions on palpebral conj   Cornea Mild Arcus, Well healed temporal cataract wounds Arcus, 1+ Descemet's folds, Well healed temporal cataract wounds   Anterior Chamber deep, 1+ cell/pigment deep, 1+ cell/pigment   Iris Round and dilated, mild patch of atrophy at 0800, no NVI Round and dilated, No NVI   Lens PC IOL in good position PC IOL in good position   Vitreous Vitreous syneresis Vitreous syneresis       Fundus Exam      Right Left   Disc Pink and Sharp Pink and Sharp   C/D Ratio 0.3 0.3   Macula Blunted foveal reflex, +cystic edema / IRF --  persistent, scattered MA -- improved, no exudate Blunted foveal reflex, massive central edema -- improving, scattered DBH and MAs, puncatate exudates inferior to fovea - improving   Vessels Vascular attenuation, Tortuous Vascular attenuation, Tortuous   Periphery Attached, scattered MA Attached, pigmented choroidal nevus superiorly with mild elevation, +drusen, no SRF          IMAGING AND PROCEDURES  Imaging and Procedures for @TODAY @  OCT, Retina - OU - Both Eyes       Right Eye Quality was good. Central Foveal Thickness: 356. Progression has been stable. Findings include abnormal foveal contour, intraretinal hyper-reflective material, intraretinal fluid, subretinal fluid, outer retinal atrophy (Minimal improvement in IRF/IRHM).   Left Eye Quality was good. Central Foveal Thickness: 529. Progression has improved. Findings include intraretinal fluid, intraretinal hyper-reflective material, subretinal fluid, abnormal foveal contour (interval improvement in IRF/IRHM, Sub-RPE hyper-reflective mass consistent with choroidal nevus--superior to disc, caught on widefield -- stable from prior).   Notes *Images captured and stored on drive  Diagnosis / Impression: Severe DME OU OD: Minimal improvement in IRF/IRHM  OS: Mild interval improvement in IRF/IRHM, Sub RPE hyper-reflective mass consistent with choroidal nevus, superior to disc caught on widefield -- stable from prior   Clinical management:  See below  Abbreviations: NFP - Normal foveal profile. CME - cystoid macular edema. PED - pigment epithelial detachment. IRF - intraretinal fluid. SRF - subretinal fluid. EZ - ellipsoid zone. ERM - epiretinal membrane. ORA - outer retinal atrophy. ORT - outer retinal tubulation. SRHM - subretinal hyper-reflective material        Intravitreal Injection, Pharmacologic Agent - OD - Right Eye       Time Out 03/23/2019. 3:07 PM. Confirmed correct patient, procedure, site, and patient consented.    Anesthesia  Topical anesthesia was used. Anesthetic medications included Lidocaine 2%, Proparacaine 0.5%.   Procedure Preparation included 5% betadine to ocular surface, eyelid speculum. A (32 g) needle was used.   Injection:  2 mg aflibercept Alfonse Flavors) SOLN   NDC: O5083423, Lot: [id number], Expiration date: 08/28/2019   Route: Intravitreal, Site: Right Eye, Waste: 0.05 mL  Post-op Post injection exam found visual acuity of at least counting fingers. The patient tolerated the procedure well. There were no complications. The patient received written and verbal post procedure care education.        Intravitreal Injection, Pharmacologic Agent - OS - Left Eye       Time Out 03/23/2019. 3:15 PM. Confirmed correct patient, procedure, site, and patient consented.   Anesthesia Topical anesthesia was used. Anesthetic medications included Lidocaine 2%, Proparacaine 0.5%.   Procedure Preparation included 5% betadine to ocular surface, eyelid speculum. A (32 g) needle was used.   Injection:  2 mg aflibercept Alfonse Flavors) SOLN   NDC: L454919, Lot: [id number], Expiration date: 08/01/2019   Route: Intravitreal, Site: Left Eye, Waste: 0.05 mL  Post-op Post injection exam found visual acuity of at least counting fingers. The patient tolerated the procedure well. There were no complications. The patient received written and verbal post procedure care education.                 ASSESSMENT/PLAN:    ICD-10-CM   1. Severe nonproliferative diabetic retinopathy of both eyes with macular edema associated with type 2 diabetes mellitus (HCC)  [id number] Intravitreal Injection, Pharmacologic Agent - OD - Right Eye    Intravitreal Injection, Pharmacologic Agent - OS - Left Eye    aflibercept (EYLEA) SOLN 2 mg    aflibercept (EYLEA) SOLN 2 mg  2. 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 Essential hypertension  I10   5. Hypertensive  retinopathy of both eyes  H35.033   6. Pseudophakia of both eyes  Z96.1     1. Severe nonproliferative diabetic retinopathy with DME, OU (OS>OD)  - s/p IVA OD #1 (06.26.20), #2 (08.14.20), #3 (09.17.20), #4 (10.16.20)  - s/p IVA OS #1 (07.17.20), #2 (08.14.20), #3 (09.17.20)  - s/p IVA OU (08.14.20), #3 (09.17.20)  - s/p IVE OS #1 (10.16.20) - sample, #2 (11.13.20), #3 (12.11.20), #4 (01.18.21)  - s/p IVE OD #1 (11.13.20), #2 (12.11.20), #3 (01.18.21)  - FA (06.26.20) shows Severe NPDR OU w/ Late leaking MA OU, Enlarged FAZ OU, No NV OU, No significant hyperfluorescence of disc  - BCVA: OD 20/30 - stable; OS 20/150 - decreased from 20/100  - OCT shows persistent IRF OD (minimal improvement); mild interval improvement in IRF OS -- still severe  - recommend treating both eyes with Eylea today  - recommend IVE OD #4 and IVE #5 OS today, 02.19.21  - pt wishes to proceed  - RBA of procedure discussed, questions answered  - informed consent obtained  - Eylea informed consent form signed and scanned on 01.18.2021  - see procedure note  - [email or web address] Benefits Investigation initiated 10.16.2020 -- approved for 2021  - f/u in 4 weeks -- DFE/OCT/possible injection OU  2. Retinal Edema OU  - based on FA, suspect majority of macular edema due to DM as above, but there may be some edema secondary to post-op CME  - cont PF qid OU and ketorolac qid OU -- pt is only using bid -- difficulty with compliance  at work  - monitor  - f/u 4 wks  3. Choroidal Nevus OS  - located at 1200, mild elevation, +drusen, no SRF  - baseline optos pictures obtained, 06.26.20  - discussed possible referral to [facility] oncologist at [facility] for evaluation/management  4,5.Hypertensive retinopathy OU  - discussed importance of tight BP control  - monitor  6. Pseudophakia OU  - s/p CE/IOL OU (OD on 06.03.20 and OS 06.10.20 by Dr. Gershon Crane)  - beautiful surgeries w/ IOLs in excellent position  - macular edema  limiting vision as above  - monitor  7. Ocular hypertension OD  - IOP 17 OD today from 22  - cont Cosopt BID OD  - monitor   Ophthalmic Meds Ordered this visit:  Meds ordered this encounter  Medications  . ketorolac (ACULAR) 0.5 % ophthalmic solution    Sig: Place 1 drop into both eyes 4 (four) times daily.    Dispense:  10 mL    Refill:  3  . aflibercept (EYLEA) SOLN 2 mg  . aflibercept (EYLEA) SOLN 2 mg      Return in about 4 weeks (around 04/20/2019) for f/u NPDR OU, DFE, OCT.  There are no Patient Instructions on file for this visit.   Explained the diagnoses, plan, and follow up with the patient and they expressed understanding.  Patient expressed understanding of the importance of proper follow up care.   This document serves as a record of services personally performed by Gardiner Sleeper, MD, PhD. It was created on their behalf by Ernest Mallick, OA, an ophthalmic assistant. The creation of this record is the provider's dictation and/or activities during the visit.    Electronically signed by: Ernest Mallick, OA 02.16.2021 2:16 AM  Gardiner Sleeper, M.D., Ph.D. Diseases & Surgery of the Retina and Vitreous [facility]  I have reviewed the above documentation for accuracy and completeness, and I agree with the above. Gardiner Sleeper, M.D., Ph.D. 03/24/19 2:16 AM   Abbreviations: M myopia (nearsighted); A astigmatism; H hyperopia (farsighted); P presbyopia; Mrx spectacle prescription;  CTL contact lenses; OD right eye; OS left eye; OU both eyes  XT exotropia; ET esotropia; PEK punctate epithelial keratitis; PEE punctate epithelial erosions; DES dry eye syndrome; MGD meibomian gland dysfunction; ATs artificial tears; PFAT's preservative free artificial tears; [place] nuclear sclerotic cataract; PSC posterior subcapsular cataract; ERM epi-retinal membrane; PVD posterior vitreous detachment; RD retinal detachment; DM diabetes mellitus; DR diabetic retinopathy;  NPDR non-proliferative diabetic retinopathy; PDR proliferative diabetic retinopathy; CSME clinically significant macular edema; DME diabetic macular edema; dbh dot blot hemorrhages; CWS cotton wool spot; POAG primary open angle glaucoma; C/D cup-to-disc ratio; HVF humphrey visual field; GVF goldmann visual field; OCT optical coherence tomography; IOP intraocular pressure; BRVO Branch retinal vein occlusion; CRVO central retinal vein occlusion; CRAO central retinal artery occlusion; BRAO branch retinal artery occlusion; RT retinal tear; SB scleral buckle; PPV pars plana vitrectomy; VH Vitreous hemorrhage; PRP panretinal laser photocoagulation; IVK intravitreal kenalog; VMT vitreomacular traction; MH Macular hole;  NVD neovascularization of the disc; NVE neovascularization elsewhere; AREDS age related eye disease study; ARMD age related macular degeneration; POAG primary open angle glaucoma; EBMD epithelial/anterior basement membrane dystrophy; ACIOL anterior chamber intraocular lens; IOL intraocular lens; PCIOL posterior chamber intraocular lens; Phaco/IOL phacoemulsification with intraocular lens placement; [place] photorefractive keratectomy; LASIK laser assisted in situ keratomileusis; HTN hypertension; DM diabetes mellitus; COPD chronic obstructive pulmonary disease

## 2019-03-23 ENCOUNTER — Ambulatory Visit (INDEPENDENT_AMBULATORY_CARE_PROVIDER_SITE_OTHER): Payer: BC Managed Care – PPO | Admitting: Ophthalmology

## 2019-03-23 DIAGNOSIS — I10 Essential (primary) hypertension: Secondary | ICD-10-CM | POA: Diagnosis not present

## 2019-03-23 DIAGNOSIS — H3581 Retinal edema: Secondary | ICD-10-CM | POA: Diagnosis not present

## 2019-03-23 DIAGNOSIS — D3132 Benign neoplasm of left choroid: Secondary | ICD-10-CM

## 2019-03-23 DIAGNOSIS — E113413 Type 2 diabetes mellitus with severe nonproliferative diabetic retinopathy with macular edema, bilateral: Secondary | ICD-10-CM

## 2019-03-23 DIAGNOSIS — H35033 Hypertensive retinopathy, bilateral: Secondary | ICD-10-CM

## 2019-03-23 DIAGNOSIS — Z961 Presence of intraocular lens: Secondary | ICD-10-CM

## 2019-03-23 MED ORDER — KETOROLAC TROMETHAMINE 0.5 % OP SOLN: 1.0000 [drp] | Freq: Four times a day (QID) | OPHTHALMIC | 3 refills | Status: DC

## 2019-03-24 ENCOUNTER — Encounter (INDEPENDENT_AMBULATORY_CARE_PROVIDER_SITE_OTHER): Payer: Self-pay | Admitting: Ophthalmology

## 2019-03-24 MED ORDER — AFLIBERCEPT 2MG/0.05ML IZ SOLN FOR KALEIDOSCOPE
2.0000 mg | INTRAVITREAL | Status: AC | PRN
  Administered 2019-03-24: 2 mg via INTRAVITREAL

## 2019-03-24 MED ORDER — AFLIBERCEPT 2MG/0.05ML IZ SOLN FOR KALEIDOSCOPE
2.0000 mg | INTRAVITREAL | Status: AC | PRN
  Administered 2019-03-24: 02:00:00 2 mg via INTRAVITREAL

## 2019-04-04 DIAGNOSIS — D509 Iron deficiency anemia, unspecified: Secondary | ICD-10-CM | POA: Diagnosis not present

## 2019-04-11 ENCOUNTER — Emergency Department (HOSPITAL_COMMUNITY): Payer: BC Managed Care – PPO

## 2019-04-11 ENCOUNTER — Emergency Department (HOSPITAL_COMMUNITY)
Admission: EM | Admit: 2019-04-11 | Discharge: 2019-04-11 | Disposition: A | Discharge status: Home / Self Care | Payer: BC Managed Care – PPO | Attending: Emergency Medicine | Admitting: Emergency Medicine

## 2019-04-11 ENCOUNTER — Other Ambulatory Visit: Payer: Self-pay

## 2019-04-11 ENCOUNTER — Encounter (HOSPITAL_COMMUNITY): Payer: Self-pay | Admitting: Emergency Medicine

## 2019-04-11 DIAGNOSIS — R079 Chest pain, unspecified: Secondary | ICD-10-CM | POA: Diagnosis not present

## 2019-04-11 DIAGNOSIS — R911 Solitary pulmonary nodule: Secondary | ICD-10-CM | POA: Insufficient documentation

## 2019-04-11 DIAGNOSIS — E11319 Type 2 diabetes mellitus with unspecified diabetic retinopathy without macular edema: Secondary | ICD-10-CM | POA: Diagnosis not present

## 2019-04-11 DIAGNOSIS — R0602 Shortness of breath: Secondary | ICD-10-CM | POA: Diagnosis not present

## 2019-04-11 DIAGNOSIS — J441 Chronic obstructive pulmonary disease with (acute) exacerbation: Secondary | ICD-10-CM | POA: Diagnosis not present

## 2019-04-11 DIAGNOSIS — Z7982 Long term (current) use of aspirin: Secondary | ICD-10-CM | POA: Diagnosis not present

## 2019-04-11 DIAGNOSIS — D649 Anemia, unspecified: Secondary | ICD-10-CM | POA: Insufficient documentation

## 2019-04-11 DIAGNOSIS — Z794 Long term (current) use of insulin: Secondary | ICD-10-CM | POA: Diagnosis not present

## 2019-04-11 DIAGNOSIS — Z79899 Other long term (current) drug therapy: Secondary | ICD-10-CM | POA: Insufficient documentation

## 2019-04-11 DIAGNOSIS — H35033 Hypertensive retinopathy, bilateral: Secondary | ICD-10-CM | POA: Diagnosis not present

## 2019-04-11 LAB — BASIC METABOLIC PANEL
Anion gap: 15 (ref 5–15)
BUN: 15 mg/dL (ref 6–20)
CO2: 22 mmol/L (ref 22–32)
Calcium: 9.4 mg/dL (ref 8.9–10.3)
Chloride: 104 mmol/L (ref 98–111)
Creatinine, Ser: 1.17 mg/dL (ref 0.61–1.24)
GFR calc Af Amer: >60 mL/min (ref >60)
GFR calc non Af Amer: >60 mL/min (ref >60)
Glucose, Bld: 190 mg/dL — ABNORMAL HIGH (ref 70–99)
Potassium: 3.8 mmol/L (ref 3.5–5.1)
Sodium: 141 mmol/L (ref 135–145)

## 2019-04-11 LAB — CBC
HCT: 33.3 % — ABNORMAL LOW (ref 39.0–52.0)
Hemoglobin: 9.8 g/dL — ABNORMAL LOW (ref 13.0–17.0)
MCH: 25.4 pg — ABNORMAL LOW (ref 26.0–34.0)
MCHC: 29.4 g/dL — ABNORMAL LOW (ref 30.0–36.0)
MCV: 86.3 fL (ref 80.0–100.0)
Platelets: 118 10*3/uL — ABNORMAL LOW (ref 150–400)
RBC: 3.86 MIL/uL — ABNORMAL LOW (ref 4.22–5.81)
RDW: 18.4 % — ABNORMAL HIGH (ref 11.5–15.5)
WBC: 4.5 10*3/uL (ref 4.0–10.5)
nRBC: 0 % (ref 0.0–0.2)

## 2019-04-11 LAB — TROPONIN I (HIGH SENSITIVITY): Troponin I (High Sensitivity): 4 ng/L (ref <18)

## 2019-04-11 MED ORDER — PREDNISONE 10 MG (21) PO TBPK: ORAL_TABLET | Freq: Every day | ORAL | 0 refills | Status: DC

## 2019-04-11 MED ORDER — PREDNISONE 20 MG PO TABS
60.0000 mg | ORAL_TABLET | Freq: Once | ORAL | Status: AC
  Administered 2019-04-11: 60 mg via ORAL
  Filled 2019-04-11: qty 3

## 2019-04-11 MED ORDER — SODIUM CHLORIDE 0.9% FLUSH: 3.0000 mL | Freq: Once | INTRAVENOUS | Status: DC

## 2019-04-11 MED ORDER — ALBUTEROL SULFATE HFA 108 (90 BASE) MCG/ACT IN AERS
8.0000 | INHALATION_SPRAY | Freq: Once | RESPIRATORY_TRACT | Status: AC
  Administered 2019-04-11: 8 via RESPIRATORY_TRACT
  Filled 2019-04-11: qty 6.7

## 2019-04-11 NOTE — ED Provider Notes (Signed)
[facility] DEPT Provider Note   CSN: [id number] Arrival date & time: 04/11/19  0034     History Chief Complaint  Patient presents with  . Chest Pain    Juan [name] is a 59 y.o. male.  The history is provided by the patient.  Shortness of Breath Severity:  Moderate Onset quality:  Gradual Timing:  Constant Progression:  Worsening Chronicity:  Recurrent Relieved by:  Nothing Worsened by:  Nothing Associated symptoms: cough and wheezing   Associated symptoms: no chest pain, no fever, no hemoptysis and no vomiting   Patient with history of COPD, diabetes presents with shortness of breath.  He reports he has had increasing cough over the past several days, but at work he may been exposed to ammonia which has triggered shortness of breath.  He reports he is wheezing.  This is similar to prior episodes of his COPD.  He has no pleuritic chest pain.  He reports having mild chest tightness due to wheezing.  No hemoptysis. He is a non-smoker.  No previous history of CAD/PE     Past Medical History:  Diagnosis Date  . Arthritis   . Asthma   . COPD (chronic obstructive pulmonary disease) ([place])   . Diabetes mellitus    Type 2  . Diabetic retinopathy ([place])    NPDR OU  . GERD (gastroesophageal reflux disease)   . H/O hiatal hernia   . History of kidney stones   . Hyperlipemia   . Hypertension   . Hypertensive retinopathy    OU  . Nasal polyps   . Pneumonia 2014  . Shortness of breath   . Sleep apnea    pt has CPAP but is unable to use it d/t having polyps in his nose. Pt stated "I need to call and get a CPAP mask instead"  . Stomach ulcer     Patient Active Problem List   Diagnosis Date Noted  . Chest pain 02/28/2013  . HTN (hypertension) 02/28/2013  . DM2 (diabetes mellitus, type 2) ([place]) 02/28/2013  . Dyslipidemia 02/28/2013  . Hyposmolality and/or hyponatremia 02/28/2013  . Obesity, unspecified 02/28/2013  . Community acquired  pneumonia 02/28/2013    Past Surgical History:  Procedure Laterality Date  . CATARACT EXTRACTION Right 07/05/2018   Dr. Gershon Crane  . CATARACT EXTRACTION Left 07/12/2018   Dr. Gershon Crane  . CHOLECYSTECTOMY N/A 01/23/2015   Procedure: LAPAROSCOPIC CHOLECYSTECTOMY ;  Surgeon: Georganna Skeans, MD;  Location: [facility];  Service: General;  Laterality: N/A;  . COLONOSCOPY W/ POLYPECTOMY    . ESOPHAGOGASTRODUODENOSCOPY    . EYE SURGERY    . KNEE ARTHROSCOPY  03/24/2011   Procedure: ARTHROSCOPY KNEE;  Surgeon: Alta Corning, MD;  Location: [facility];  Service: Orthopedics;  Laterality: Right;  partial medial menisectomy, chondroplaty patella, and medial medial plica       Family History  Problem Relation Age of Onset  . Diabetes Father     Social History   Tobacco Use  . Smoking status: Never Smoker  . Smokeless tobacco: Current User    Types: Chew  Substance Use Topics  . Alcohol use: Yes    Comment: social, 1x weekly  . Drug use: No    Home Medications Prior to Admission medications   Medication Sig Start Date End Date Taking? Authorizing Provider  ADVAIR DISKUS 500-50 MCG/DOSE AEPB Inhale 1 puff into the lungs 2 (two) times daily. 01/09/15   [provider]  albuterol (PROVENTIL HFA;VENTOLIN  HFA) 108 (90 BASE) MCG/ACT inhaler Inhale 1-2 puffs into the lungs every 6 (six) hours as needed for wheezing or shortness of breath.     [provider]  albuterol (PROVENTIL) (2.5 MG/3ML) 0.083% nebulizer solution INHALE CONTENTS OF ONE VIAL IN NEBULIZER THREE TIMES DAILY AS NEEDED 03/27/15   [provider]  alfuzosin (UROXATRAL) 10 MG 24 hr tablet  07/17/18   [provider]  aspirin EC 81 MG tablet Take 81 mg by mouth daily.    [provider]  atorvastatin (LIPITOR) 40 MG tablet Take 40 mg by mouth daily.    [provider]  atorvastatin (LIPITOR) 40 MG tablet Take 40 mg by mouth daily. 11/03/17   [provider]    Empagliflozin-metFORMIN HCl (SYNJARDY) 12.06-998 MG TABS Take by mouth 2 (two) times a day. 07/06/18   [provider]  esomeprazole (NEXIUM) 40 MG capsule Take 40 mg by mouth daily at 12 noon.    [provider]  glimepiride (AMARYL) 4 MG tablet Take 4 mg by mouth 2 (two) times daily.    [provider]  HYDROMET 5-1.5 MG/5ML syrup TAKE ONE TEASPOONFUL BY MOUTH EVERY 4 TO 6 HOURS AS NEEDED FOR COUGH 03/27/15   [provider]  ketorolac (ACULAR) 0.5 % ophthalmic solution Place 1 drop into both eyes 4 (four) times daily. 03/23/19   Bernarda Caffey, MD  KOMBIGLYZE XR 2.06-998 MG TB24 Take 2 tablets by mouth at bedtime.  02/25/13   [provider]  LANTUS SOLOSTAR 100 UNIT/ML Solostar Pen Inject 42 Units into the skin at bedtime.  01/10/15   [provider]  lisinopril (ZESTRIL) 40 MG tablet Take 40 mg by mouth daily. 11/03/17   [provider]  lisinopril-hydrochlorothiazide (PRINZIDE,ZESTORETIC) 20-25 MG per tablet Take 1 tablet by mouth daily. 02/25/13   [provider]  Multiple Vitamins-Minerals (CENTRUM SILVER 50+MEN PO) as directed.    [provider]  nitrofurantoin, macrocrystal-monohydrate, (MACROBID) 100 MG capsule Take 100 mg by mouth 2 (two) times daily. 07/28/18   [provider]  ofloxacin (OCUFLOX) 0.3 % ophthalmic solution Place 1 drop into both eyes 3 (three) times daily. 06/28/18   [provider]  oxyCODONE (OXY IR/ROXICODONE) 5 MG immediate release tablet Take 1-2 tablets (5-10 mg total) by mouth every 4 (four) hours as needed (5mg  for moderate pain, 10mg  for severe pain). 01/23/15   Georganna Skeans, MD  prednisoLONE acetate (PRED FORTE) 1 % ophthalmic suspension INSTILL 1 DROP INTO BOTH EYES 4 TIMES A DAY 03/19/19   Bernarda Caffey, MD  sulfamethoxazole-trimethoprim (BACTRIM DS) 800-160 MG tablet Take 1 tablet by mouth 2 (two) times daily. 07/06/18   [provider]    Allergies     Naproxen, Naproxen sodium, and Penicillins  Review of Systems   Review of Systems  Constitutional: Negative for fever.  Respiratory: Positive for cough, shortness of breath and wheezing. Negative for hemoptysis.   Cardiovascular: Negative for chest pain and leg swelling.  Gastrointestinal: Negative for blood in stool and vomiting.  All other systems reviewed and are negative.   Physical Exam Updated Vital Signs BP (!) 153/84 (BP Location: Left Arm)   Pulse (!) 122   Temp 98.3 F (36.8 C) (Oral)   Resp 13   Ht 1.753 m (5\' 9" )   Wt 102.1 kg   SpO2 98%   BMI 33.23 kg/m   Physical Exam CONSTITUTIONAL: Well developed/well nourished, no acute distress HEAD: Normocephalic/atraumatic EYES: EOMI/PERRL ENMT: Mucous membranes moist  NECK: supple no meningeal signs SPINE/BACK:entire spine nontender CV: S1/S2 noted, no murmurs/rubs/gallops noted LUNGS: Wheezing bilaterally ABDOMEN: soft, nontender, no rebound or guarding, bowel sounds noted throughout abdomen GU:no cva tenderness NEURO: Pt is awake/alert/appropriate, moves all extremitiesx4.  No facial droop.   EXTREMITIES: pulses normal/equal, full ROM, no lower extremity edema noted or calf tenderness SKIN: warm, color normal PSYCH: no abnormalities of mood noted, alert and oriented to situation  ED Results / Procedures / Treatments   Labs (all labs ordered are listed, but only abnormal results are displayed) Labs Reviewed  BASIC METABOLIC PANEL - Abnormal; Notable for the following components:      Result Value   Glucose, Bld 190 (*)    All other components within normal limits  CBC - Abnormal; Notable for the following components:   RBC 3.86 (*)    Hemoglobin 9.8 (*)    HCT 33.3 (*)    MCH 25.4 (*)    MCHC 29.4 (*)    RDW 18.4 (*)    Platelets 118 (*)    All other components within normal limits  TROPONIN I (HIGH SENSITIVITY)    EKG EKG Interpretation  Date/Time:  Wednesday April 11 2019 00:47:35  EST Ventricular Rate:  121 PR Interval:    QRS Duration: 93 QT Interval:  311 QTC Calculation: 442 R Axis:   34 Text Interpretation: Sinus tachycardia Borderline low voltage, extremity leads Confirmed by Ripley Fraise [phone]) on 04/11/2019 1:01:20 AM   Radiology DG Chest 2 View  Result Date: 04/11/2019 CLINICAL DATA:  Chest pain and shortness of breath EXAM: CHEST - 2 VIEW COMPARISON:  03/29/2015 FINDINGS: The heart size and mediastinal contours are within normal limits. Both lungs are clear. The visualized skeletal structures are unremarkable. IMPRESSION: No active cardiopulmonary disease. Electronically Signed   By: Constance Holster M.D.   On: 04/11/2019 01:21    Procedures .1-3 Lead EKG Interpretation Performed by: Ripley Fraise, MD Authorized by: Ripley Fraise, MD     Interpretation: abnormal     ECG rate:  120   ECG rate assessment: tachycardic     Rhythm: sinus rhythm     Ectopy: none     Conduction: normal       Medications Ordered in ED Medications  sodium chloride flush (NS) 0.9 % injection 3 mL (has no administration in time range)  albuterol (VENTOLIN HFA) 108 (90 Base) MCG/ACT inhaler 8 puff (8 puffs Inhalation Given 04/11/19 0227)  predniSONE (DELTASONE) tablet 60 mg (60 mg Oral Given 04/11/19 0225)    ED Course  I have reviewed the triage vital signs and the nursing notes.  Pertinent labs & imaging results that were available during my care of the patient were reviewed by me and considered in my medical decision making (see chart for details).    MDM Rules/Calculators/A&P                      2:21 AM Patient presents for probable COPD exacerbation.  He denies any chest pain at this time.  Chest x-ray is negative, but he has had previous CT chest that revealed possible pulmonary nodule and recommended repeat CT scan I advised patient to follow-up for outpatient CT scan.  He also has anemia of unclear etiology that will require further exploration,  reports his PCP is already exploring this Will treat COPD and reassess 3:23 AM Overall patient is improving, wheezing improving.  However he is still tachycardic likely due to albuterol He denies any  chest pain. He requests prednisone taper as outpatient   Patient was monitored for several hours.  Overall he feels improved His wheezing is improving.  He is in no acute distress.  He denies any pleuritic chest pain. He reports this feels like prior COPD exacerbations.  His heart rate is consistently 120 with a sinus rhythm.  No other acute EKG changes. It appears he has had these issues in the past.  Patient was able to stand up and walk without any distress Low suspicion for acute PE at this time.  Tachycardia could be explained by albuterol he received in the ED as well as prior to arrival. We discussed strict return precautions Final Clinical Impression(s) / ED Diagnoses Final diagnoses:  COPD exacerbation ([place])  Chronic anemia  Pulmonary nodule    Rx / DC Orders ED Discharge Orders         Ordered    predniSONE (STERAPRED UNI-PAK 21 TAB) 10 MG (21) TBPK tablet  Daily     04/11/19 0322           Ripley Fraise, MD 04/11/19 [phone]

## 2019-04-11 NOTE — Discharge Instructions (Signed)
You will need to have a CAT scan of your chest in the next month to follow-up for any signs of lung cancer Please talk to your primary doctor about this. I will also refer you to a lung specialist

## 2019-04-11 NOTE — ED Triage Notes (Signed)
Patient is complaining of sob and mid chest pain. Patient states this started this evening.

## 2019-04-16 ENCOUNTER — Other Ambulatory Visit: Payer: Self-pay | Admitting: Family Medicine

## 2019-04-16 DIAGNOSIS — R911 Solitary pulmonary nodule: Secondary | ICD-10-CM

## 2019-04-19 ENCOUNTER — Ambulatory Visit: Payer: BC Managed Care – PPO | Admitting: Pulmonary Disease

## 2019-04-19 ENCOUNTER — Encounter: Payer: Self-pay | Admitting: Pulmonary Disease

## 2019-04-19 ENCOUNTER — Other Ambulatory Visit: Payer: Self-pay

## 2019-04-19 VITALS — BP 156/78 | HR 62 | Temp 97.6°F | Ht 69.0 in | Wt 232.4 lb

## 2019-04-19 DIAGNOSIS — R0602 Shortness of breath: Secondary | ICD-10-CM

## 2019-04-19 MED ORDER — CLARITHROMYCIN 500 MG PO TABS: 500.0000 mg | ORAL_TABLET | Freq: Two times a day (BID) | ORAL | 0 refills | Status: DC

## 2019-04-19 MED ORDER — PREDNISONE 10 MG PO TABS: 20.0000 mg | ORAL_TABLET | Freq: Every day | ORAL | 0 refills | Status: AC

## 2019-04-19 NOTE — Patient Instructions (Signed)
COPD exacerbation  Course of antibiotics-Biaxin 500 p.o. twice daily for 7 days  Prednisone 20 p.o. daily for 5 days and then cut it down, may stop prednisone once you are feeling much better  Follow-up with your CT scan ordered for April  We will order a breathing study for you  I will see you back first week in May  Call with significant concerns

## 2019-04-19 NOTE — Progress Notes (Signed)
Juan [name]    [id number]    November 11, 1960  Primary Care Physician:White, Caren Griffins, MD  Referring Physician: Ripley Fraise, MD [place],  [place] [address]  Chief complaint:   Patient seen for COPD exacerbation Was recently in the emergency department   HPI:  He has a cough, sputum production, wheezing As of COPD for a few years Chronic bronchitis Nasal stuffiness and congestion  Brings up yellow phlegm  Quit smoking over 20 years ago  Uses Advair, albuterol  History of hypertension, emphysema, hypercholesterolemia   Outpatient Encounter Medications as of 04/19/2019  Medication Sig  . ADVAIR DISKUS 500-50 MCG/DOSE AEPB Inhale 1 puff into the lungs 2 (two) times daily.  Marland Kitchen albuterol (PROVENTIL HFA;VENTOLIN HFA) 108 (90 BASE) MCG/ACT inhaler Inhale 1-2 puffs into the lungs every 6 (six) hours as needed for wheezing or shortness of breath.   Marland Kitchen albuterol (PROVENTIL) (2.5 MG/3ML) 0.083% nebulizer solution INHALE CONTENTS OF ONE VIAL IN NEBULIZER THREE TIMES DAILY AS NEEDED  . aspirin EC 81 MG tablet Take 81 mg by mouth daily.  Marland Kitchen atorvastatin (LIPITOR) 40 MG tablet Take 40 mg by mouth daily.  . Empagliflozin-metFORMIN HCl (SYNJARDY) 12.06-998 MG TABS Take by mouth 2 (two) times a day.  . esomeprazole (NEXIUM) 40 MG capsule Take 40 mg by mouth daily at 12 noon.  . insulin glargine, 2 Unit Dial, (TOUJEO MAX SOLOSTAR) 300 UNIT/ML Solostar Pen Inject into the skin.  Marland Kitchen lisinopril (ZESTRIL) 40 MG tablet Take 40 mg by mouth daily.  . Multiple Vitamins-Minerals (CENTRUM SILVER 50+MEN PO) as directed.  Marland Kitchen alfuzosin (UROXATRAL) 10 MG 24 hr tablet   . clarithromycin (BIAXIN) 500 MG tablet Take 1 tablet (500 mg total) by mouth 2 (two) times daily for 7 days.  Marland Kitchen glimepiride (AMARYL) 4 MG tablet Take 4 mg by mouth 2 (two) times daily.  Marland Kitchen HYDROMET 5-1.5 MG/5ML syrup TAKE ONE TEASPOONFUL BY MOUTH EVERY 4 TO 6 HOURS AS NEEDED FOR COUGH  . ketorolac (ACULAR) 0.5 %  ophthalmic solution Place 1 drop into both eyes 4 (four) times daily. (Patient not taking: Reported on 04/19/2019)  . KOMBIGLYZE XR 2.06-998 MG TB24 Take 2 tablets by mouth at bedtime.   Marland Kitchen LANTUS SOLOSTAR 100 UNIT/ML Solostar Pen Inject 42 Units into the skin at bedtime.   Marland Kitchen lisinopril-hydrochlorothiazide (PRINZIDE,ZESTORETIC) 20-25 MG per tablet Take 1 tablet by mouth daily.  . nitrofurantoin, macrocrystal-monohydrate, (MACROBID) 100 MG capsule Take 100 mg by mouth 2 (two) times daily.  Marland Kitchen ofloxacin (OCUFLOX) 0.3 % ophthalmic solution Place 1 drop into both eyes 3 (three) times daily.  . prednisoLONE acetate (PRED FORTE) 1 % ophthalmic suspension INSTILL 1 DROP INTO BOTH EYES 4 TIMES A DAY (Patient not taking: Reported on 04/19/2019)  . predniSONE (DELTASONE) 10 MG tablet Take 2 tablets (20 mg total) by mouth daily with breakfast for 10 days.  . [DISCONTINUED] atorvastatin (LIPITOR) 40 MG tablet Take 40 mg by mouth daily.  . [DISCONTINUED] oxyCODONE (OXY IR/ROXICODONE) 5 MG immediate release tablet Take 1-2 tablets (5-10 mg total) by mouth every 4 (four) hours as needed (5mg  for moderate pain, 10mg  for severe pain). (Patient not taking: Reported on 04/19/2019)  . [DISCONTINUED] predniSONE (STERAPRED UNI-PAK 21 TAB) 10 MG (21) TBPK tablet Take by mouth daily. Take 6 tabs by mouth daily  for 2 days, then 5 tabs for 2 days, then 4 tabs for 2 days, then 3 tabs for 2 days, 2 tabs for  2 days, then 1 tab by mouth daily for 2 days (Patient not taking: Reported on 04/19/2019)  . [DISCONTINUED] sulfamethoxazole-trimethoprim (BACTRIM DS) 800-160 MG tablet Take 1 tablet by mouth 2 (two) times daily.   No facility-administered encounter medications on file as of 04/19/2019.    Allergies as of 04/19/2019 - Review Complete 04/11/2019  Allergen Reaction Noted  . Naproxen Shortness Of Breath 01/28/2017  . Naproxen sodium Shortness Of Breath 07/28/2018  . Penicillins Rash 03/22/2011    Past Medical History:    Diagnosis Date  . Arthritis   . Asthma   . COPD (chronic obstructive pulmonary disease) ([place])   . Diabetes mellitus    Type 2  . Diabetic retinopathy ([place])    NPDR OU  . GERD (gastroesophageal reflux disease)   . H/O hiatal hernia   . History of kidney stones   . Hyperlipemia   . Hypertension   . Hypertensive retinopathy    OU  . Nasal polyps   . Pneumonia 2014  . Shortness of breath   . Sleep apnea    pt has CPAP but is unable to use it d/t having polyps in his nose. Pt stated "I need to call and get a CPAP mask instead"  . Stomach ulcer     Past Surgical History:  Procedure Laterality Date  . CATARACT EXTRACTION Right 07/05/2018   Dr. Gershon Crane  . CATARACT EXTRACTION Left 07/12/2018   Dr. Gershon Crane  . CHOLECYSTECTOMY N/A 01/23/2015   Procedure: LAPAROSCOPIC CHOLECYSTECTOMY ;  Surgeon: Georganna Skeans, MD;  Location: [facility];  Service: General;  Laterality: N/A;  . COLONOSCOPY W/ POLYPECTOMY    . ESOPHAGOGASTRODUODENOSCOPY    . EYE SURGERY    . KNEE ARTHROSCOPY  03/24/2011   Procedure: ARTHROSCOPY KNEE;  Surgeon: Alta Corning, MD;  Location: [facility];  Service: Orthopedics;  Laterality: Right;  partial medial menisectomy, chondroplaty patella, and medial medial plica    Family History  Problem Relation Age of Onset  . Diabetes Father     Social History   Socioeconomic History  . Marital status: Single    Spouse name: Not on file  . Number of children: Not on file  . Years of education: Not on file  . Highest education level: Not on file  Occupational History  . Not on file  Tobacco Use  . Smoking status: Former Smoker    Years: 20.00    Types: Cigarettes  . Smokeless tobacco: Current User    Types: Chew  . Tobacco comment: 1 can/daily  Substance and Sexual Activity  . Alcohol use: Yes    Comment: social, 1x weekly  . Drug use: No  . Sexual activity: Never  Other Topics Concern  . Not on file  Social History Narrative  . Not on file    Social Determinants of Health   Financial Resource Strain:   . Difficulty of Paying Living Expenses:   Food Insecurity:   . Worried About Charity fundraiser in the Last Year:   . Arboriculturist in the Last Year:   Transportation Needs:   . Film/video editor (Medical):   Marland Kitchen Lack of Transportation (Non-Medical):   Physical Activity:   . Days of Exercise per Week:   . Minutes of Exercise per Session:   Stress:   . Feeling of Stress :   Social Connections:   . Frequency of Communication with Friends and Family:   . Frequency of Social Gatherings with  Friends and Family:   . Attends Religious Services:   . Active Member of Clubs or Organizations:   . Attends Archivist Meetings:   Marland Kitchen Marital Status:   Intimate Partner Violence:   . Fear of Current or Ex-Partner:   . Emotionally Abused:   Marland Kitchen Physically Abused:   . Sexually Abused:     Review of Systems  Respiratory: Positive for cough and shortness of breath.     Vitals:   04/19/19 1506  BP: (!) 156/78  Pulse: 62  Temp: 97.6 F (36.4 C)  SpO2: 97%     Physical Exam  Constitutional: He is oriented to person, place, and time. He appears well-developed.  HENT:  Head: Normocephalic and atraumatic.  Eyes: Pupils are equal, round, and reactive to light.  Neck: No tracheal deviation present. No thyromegaly present.  Cardiovascular: Normal rate and regular rhythm.  Pulmonary/Chest: Effort normal and breath sounds normal. No respiratory distress. He has no wheezes. He has no rales. He exhibits no tenderness.  Musculoskeletal:        General: Normal range of motion.     Cervical back: Normal range of motion and neck supple.  Neurological: He is alert and oriented to person, place, and time.  Skin: Skin is warm and dry.  Psychiatric: He has a normal mood and affect.   CT scan of the chest reviewed -Reviewed with the patient showing 2 groundglass changes in the right upper lobe Recent chest x-ray during  recent ED visit reviewed with the patient-no infiltrate  Assessment:  COPD - acute exacerbation of COPD Chronic bronchitis CT scan from 2017 revealing 2 lung nodules  Plan/Recommendations: Course of antibiotics-Biaxin 500 twice daily for 7 days, prednisone 20 daily for 5 to 7 days Obtain pulmonary function test CT scan of the chest scheduled for mid April  I will follow-up with him first week of May   Sherrilyn Rist MD [facility] 04/19/2019, 3:38 PM  CC: Ripley Fraise, MD

## 2019-04-20 ENCOUNTER — Ambulatory Visit (INDEPENDENT_AMBULATORY_CARE_PROVIDER_SITE_OTHER): Payer: BC Managed Care – PPO | Admitting: Ophthalmology

## 2019-04-20 ENCOUNTER — Encounter (INDEPENDENT_AMBULATORY_CARE_PROVIDER_SITE_OTHER): Payer: Self-pay | Admitting: Ophthalmology

## 2019-04-20 ENCOUNTER — Encounter (INDEPENDENT_AMBULATORY_CARE_PROVIDER_SITE_OTHER): Payer: BC Managed Care – PPO | Admitting: Ophthalmology

## 2019-04-20 DIAGNOSIS — H3581 Retinal edema: Secondary | ICD-10-CM

## 2019-04-20 DIAGNOSIS — E113413 Type 2 diabetes mellitus with severe nonproliferative diabetic retinopathy with macular edema, bilateral: Secondary | ICD-10-CM

## 2019-04-20 DIAGNOSIS — Z961 Presence of intraocular lens: Secondary | ICD-10-CM

## 2019-04-20 DIAGNOSIS — I10 Essential (primary) hypertension: Secondary | ICD-10-CM | POA: Diagnosis not present

## 2019-04-20 DIAGNOSIS — H35033 Hypertensive retinopathy, bilateral: Secondary | ICD-10-CM

## 2019-04-20 DIAGNOSIS — D3132 Benign neoplasm of left choroid: Secondary | ICD-10-CM

## 2019-04-20 MED ORDER — KETOROLAC TROMETHAMINE 0.5 % OP SOLN: 1.0000 [drp] | Freq: Four times a day (QID) | OPHTHALMIC | 3 refills | Status: DC

## 2019-04-20 MED ORDER — PREDNISOLONE ACETATE 1 % OP SUSP: 1.0000 [drp] | Freq: Four times a day (QID) | OPHTHALMIC | 2 refills | Status: DC

## 2019-04-20 MED ORDER — DORZOLAMIDE HCL-TIMOLOL MAL 2-0.5 % OP SOLN: 1.0000 [drp] | Freq: Two times a day (BID) | OPHTHALMIC | 5 refills | Status: DC

## 2019-04-20 NOTE — Progress Notes (Addendum)
[facility] Note  04/20/2019     CHIEF COMPLAINT Patient presents for Retina Follow Up   HISTORY OF PRESENT ILLNESS: Juan [name] is a 59 y.o. male who presents to the clinic today for:   HPI    Retina Follow Up    Patient presents with  Diabetic Retinopathy.  In both eyes.  Severity is severe.  Duration of 4 weeks.  Since onset it is stable.  I, the attending physician,  performed the HPI with the patient and updated documentation appropriately.          Comments    Patient states on prednisone taper for coughing, phlem. On prednisone for the past week. Patient has COPD. Does not have COVID. Had COVID vaccine on 03.17.21 BS was 152 this am. Last a1c was under 7, but this was last checked almost a year ago. Patient ran out of Pred Forte and ketorolac about 1 week ago. Has a small amount of cosopt left, but not taking currently (last used a week and a half ago). Was using cosopt bid OD.        Last edited by Bernarda Caffey, MD on 04/20/2019  2:51 PM. (History)    Patient    Referring physician: Harlan Stains, MD [place],  [place] [address]  HISTORICAL INFORMATION:   Selected notes from the MEDICAL RECORD NUMBER Referred by Dr. Rutherford Guys for concern of CME s/p cataract sx LEE:  Ocular Hx- PMH-   CURRENT MEDICATIONS: Current Outpatient Medications (Ophthalmic Drugs)  Medication Sig  . dorzolamide-timolol (COSOPT) 22.3-6.8 MG/ML ophthalmic solution Place 1 drop into the right eye 2 (two) times daily.  Marland Kitchen ketorolac (ACULAR) 0.5 % ophthalmic solution Place 1 drop into both eyes 4 (four) times daily.  Marland Kitchen ofloxacin (OCUFLOX) 0.3 % ophthalmic solution Place 1 drop into both eyes 3 (three) times daily.  . prednisoLONE acetate (PRED FORTE) 1 % ophthalmic suspension Place 1 drop into both eyes 4 (four) times daily.   No current facility-administered medications for this visit. (Ophthalmic Drugs)   Current Outpatient  Medications (Other)  Medication Sig  . ADVAIR DISKUS 500-50 MCG/DOSE AEPB Inhale 1 puff into the lungs 2 (two) times daily.  Marland Kitchen albuterol (PROVENTIL HFA;VENTOLIN HFA) 108 (90 BASE) MCG/ACT inhaler Inhale 1-2 puffs into the lungs every 6 (six) hours as needed for wheezing or shortness of breath.   Marland Kitchen albuterol (PROVENTIL) (2.5 MG/3ML) 0.083% nebulizer solution INHALE CONTENTS OF ONE VIAL IN NEBULIZER THREE TIMES DAILY AS NEEDED  . alfuzosin (UROXATRAL) 10 MG 24 hr tablet   . aspirin EC 81 MG tablet Take 81 mg by mouth daily.  Marland Kitchen atorvastatin (LIPITOR) 40 MG tablet Take 40 mg by mouth daily.  . Empagliflozin-metFORMIN HCl (SYNJARDY) 12.06-998 MG TABS Take by mouth 2 (two) times a day.  . esomeprazole (NEXIUM) 40 MG capsule Take 40 mg by mouth daily at 12 noon.  Marland Kitchen glimepiride (AMARYL) 4 MG tablet Take 4 mg by mouth 2 (two) times daily.  Marland Kitchen HYDROMET 5-1.5 MG/5ML syrup TAKE ONE TEASPOONFUL BY MOUTH EVERY 4 TO 6 HOURS AS NEEDED FOR COUGH  . insulin glargine, 2 Unit Dial, (TOUJEO MAX SOLOSTAR) 300 UNIT/ML Solostar Pen Inject into the skin.  Marland Kitchen KOMBIGLYZE XR 2.06-998 MG TB24 Take 2 tablets by mouth at bedtime.   Marland Kitchen LANTUS SOLOSTAR 100 UNIT/ML Solostar Pen Inject 42 Units into the skin at bedtime.   Marland Kitchen lisinopril (ZESTRIL) 40 MG tablet Take 40  mg by mouth daily.  Marland Kitchen lisinopril-hydrochlorothiazide (PRINZIDE,ZESTORETIC) 20-25 MG per tablet Take 1 tablet by mouth daily.  . Multiple Vitamins-Minerals (CENTRUM SILVER 50+MEN PO) as directed.  . nitrofurantoin, macrocrystal-monohydrate, (MACROBID) 100 MG capsule Take 100 mg by mouth 2 (two) times daily.  . predniSONE (DELTASONE) 10 MG tablet Take 2 tablets (20 mg total) by mouth daily with breakfast for 10 days.  . clarithromycin (BIAXIN) 500 MG tablet Take 1 tablet (500 mg total) by mouth 2 (two) times daily for 7 days. (Patient not taking: Reported on 04/20/2019)   No current facility-administered medications for this visit. (Other)      REVIEW OF SYSTEMS: ROS     Positive for: Endocrine, Eyes   Negative for: Constitutional, Gastrointestinal, Neurological, Skin, Genitourinary, Musculoskeletal, HENT, Cardiovascular, Respiratory, Psychiatric, Allergic/Imm, Heme/Lymph   Last edited by Roselee Nova D, COT on 04/20/2019  2:20 PM. (History)       ALLERGIES Allergies  Allergen Reactions  . Naproxen Shortness Of Breath  . Naproxen Sodium Shortness Of Breath  . Penicillins Rash    Has patient had a PCN reaction causing immediate rash, facial/tongue/throat swelling, SOB or lightheadedness with hypotension: Yes Has patient had a PCN reaction causing severe rash involving mucus membranes or skin necrosis: No Has patient had a PCN reaction that required hospitalization No Has patient had a PCN reaction occurring within the last 10 years: No If all of the above answers are "NO", then may proceed with Cephalosporin use.  Has patient had a PCN reaction causing immediate rash, facial/tongue/throat swelling, SOB or lightheadedness with hypotension: Yes Has patient had a PCN reaction causing severe rash involving mucus membranes or skin necrosis: No Has patient had a PCN reaction that required hospitalization No Has patient had a PCN reaction occurring within the last 10 years: No If all of the above answers are "NO", then may proceed with Cephalosporin use.     PAST MEDICAL HISTORY Past Medical History:  Diagnosis Date  . Arthritis   . Asthma   . COPD (chronic obstructive pulmonary disease) ([place])   . Diabetes mellitus    Type 2  . Diabetic retinopathy ([place])    NPDR OU  . GERD (gastroesophageal reflux disease)   . H/O hiatal hernia   . History of kidney stones   . Hyperlipemia   . Hypertension   . Hypertensive retinopathy    OU  . Nasal polyps   . Pneumonia 2014  . Shortness of breath   . Sleep apnea    pt has CPAP but is unable to use it d/t having polyps in his nose. Pt stated "I need to call and get a CPAP mask instead"  . Stomach ulcer     Past Surgical History:  Procedure Laterality Date  . CATARACT EXTRACTION Right 07/05/2018   Dr. Gershon Crane  . CATARACT EXTRACTION Left 07/12/2018   Dr. Gershon Crane  . CHOLECYSTECTOMY N/A 01/23/2015   Procedure: LAPAROSCOPIC CHOLECYSTECTOMY ;  Surgeon: Georganna Skeans, MD;  Location: [facility];  Service: General;  Laterality: N/A;  . COLONOSCOPY W/ POLYPECTOMY    . ESOPHAGOGASTRODUODENOSCOPY    . EYE SURGERY    . KNEE ARTHROSCOPY  03/24/2011   Procedure: ARTHROSCOPY KNEE;  Surgeon: Alta Corning, MD;  Location: [facility];  Service: Orthopedics;  Laterality: Right;  partial medial menisectomy, chondroplaty patella, and medial medial plica    FAMILY HISTORY Family History  Problem Relation Age of Onset  . Diabetes Father     SOCIAL HISTORY Social  History   Tobacco Use  . Smoking status: Former Smoker    Years: 20.00    Types: Cigarettes  . Smokeless tobacco: Current User    Types: Chew  . Tobacco comment: 1 can/daily  Substance Use Topics  . Alcohol use: Yes    Comment: social, 1x weekly  . Drug use: No         OPHTHALMIC EXAM:  Base Eye Exam    Visual Acuity (Snellen - Linear)      Right Left   Dist [place] 20/40 -1 20/60 -2   Dist ph [place] 20/25 -1 NI       Tonometry (Tonopen, 2:31 PM)      Right Left   Pressure 18 17       Pupils      Dark Light Shape React APD   Right 3 2 Round Brisk None   Left 3 2 Round Brisk None       Visual Fields (Counting fingers)      Left Right    Full Full       Extraocular Movement      Right Left    Full, Ortho Full, Ortho       Neuro/Psych    Oriented x3: Yes   Mood/Affect: Normal       Dilation    Both eyes: 1.0% Mydriacyl, 2.5% Phenylephrine @ 2:31 PM        Slit Lamp and Fundus Exam    Slit Lamp Exam      Right Left   Lids/Lashes Dermatochalasis - upper lid, Telangiectasia, mild Meibomian gland dysfunction Dermatochalasis - upper lid, Telangiectasia, mild Meibomian gland dysfunction    Conjunctiva/Sclera White and quiet, +concretions on palpebral conj Mild nasal Pinguecula, +concretions on palpebral conj   Cornea Mild Arcus, Well healed temporal cataract wounds Arcus, 1+ Descemet's folds, Well healed temporal cataract wounds   Anterior Chamber deep, 1+ cell/pigment deep, 1+ cell/pigment   Iris Round and dilated, mild patch of atrophy at 0800, no NVI Round and dilated, No NVI   Lens PC IOL in good position PC IOL in good position   Vitreous Vitreous syneresis Vitreous syneresis       Fundus Exam      Right Left   Disc Pink and Sharp Pallor, Sharp rim   C/D Ratio 0.4 0.3   Macula Blunted foveal reflex, +cystic edema / IRF -- persistent, but slightly improved, scattered MA and exudate temporal mac Blunted foveal reflex, massive central edema -- improving, scattered DBH and MAs, puncatate exudates inferior to fovea - improving   Vessels Vascular attenuation, Tortuous Vascular attenuation, Tortuous   Periphery Attached, scattered MA Attached, pigmented choroidal nevus superiorly with mild elevation, +drusen, no SRF          IMAGING AND PROCEDURES  Imaging and Procedures for _0 @  OCT, Retina - OU - Both Eyes       Right Eye Quality was good. Central Foveal Thickness: 334. Progression has improved. Findings include abnormal foveal contour, intraretinal hyper-reflective material, intraretinal fluid, outer retinal atrophy, no SRF (Mild interval improvement in IRF).   Left Eye Quality was good. Central Foveal Thickness: 469. Progression has improved. Findings include intraretinal fluid, intraretinal hyper-reflective material, abnormal foveal contour, no SRF (Mild interval improvement in IRF/IRHM, Sub-RPE hyper-reflective mass consistent with choroidal nevus--superior to disc, caught on widefield -- stable from prior).   Notes *Images captured and stored on drive  Diagnosis / Impression: Severe DME OU OD: mild interval improvement in IRF  OS: Mild interval  improvement in IRF/IRHM, Sub RPE hyper-reflective mass consistent with choroidal nevus, superior to disc caught on widefield -- stable from prior   Clinical management:  See below  Abbreviations: NFP - Normal foveal profile. CME - cystoid macular edema. PED - pigment epithelial detachment. IRF - intraretinal fluid. SRF - subretinal fluid. EZ - ellipsoid zone. ERM - epiretinal membrane. ORA - outer retinal atrophy. ORT - outer retinal tubulation. SRHM - subretinal hyper-reflective material        Intravitreal Injection, Pharmacologic Agent - OD - Right Eye       Time Out 04/20/2019. 3:00 PM. Confirmed correct patient, procedure, site, and patient consented.   Anesthesia Topical anesthesia was used. Anesthetic medications included Lidocaine 2%, Proparacaine 0.5%.   Procedure Preparation included 5% betadine to ocular surface, eyelid speculum. A (32g) needle was used.   Injection:  2 mg aflibercept Alfonse Flavors) SOLN   NDC: A3590391, Lot: [id number], Expiration date: 09/28/2019   Route: Intravitreal, Site: Right Eye, Waste: 0.05 mL  Post-op Post injection exam found visual acuity of at least counting fingers. The patient tolerated the procedure well. There were no complications. The patient received written and verbal post procedure care education.        Intravitreal Injection, Pharmacologic Agent - OS - Left Eye       Time Out 04/20/2019. 3:05 PM. Confirmed correct patient, procedure, site, and patient consented.   Anesthesia Topical anesthesia was used. Anesthetic medications included Tetracaine 0.5%, Lidocaine 2%.   Procedure Preparation included 5% betadine to ocular surface. A (32g) needle was used.   Injection:  2 mg aflibercept Alfonse Flavors) SOLN   NDC: A3590391, Lot: [id number], Expiration date: 09/28/2019   Route: Intravitreal, Site: Left Eye, Waste: 0.05 mL  Post-op Post injection exam found visual acuity of at least counting fingers. The patient tolerated the  procedure well. There were no complications. The patient received written and verbal post procedure care education.                 ASSESSMENT/PLAN:    ICD-10-CM   1. Severe nonproliferative diabetic retinopathy of both eyes with macular edema associated with type 2 diabetes mellitus (HCC)  Q19.7588 Intravitreal Injection, Pharmacologic Agent - OD - Right Eye    Intravitreal Injection, Pharmacologic Agent - OS - Left Eye    aflibercept (EYLEA) SOLN 2 mg    aflibercept (EYLEA) SOLN 2 mg  2. 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 Essential hypertension  I10   5. Hypertensive retinopathy of both eyes  H35.033   6. Pseudophakia of both eyes  Z96.1     1. Severe nonproliferative diabetic retinopathy with DME, OU (OS>OD)  - s/p IVA OD #1 (06.26.20), #2 (08.14.20), #3 (09.17.20), #4 (10.16.20)  - s/p IVA OS #1 (07.17.20), #2 (08.14.20), #3 (09.17.20)  - s/p IVA OU (08.14.20), #3 (09.17.20)  - s/p IVE OS #1 (10.16.20) - sample, #2 (11.13.20), #3 (12.11.20), #4 (01.18.21), #5 (02.19.21)  - s/p IVE OD #1 (11.13.20), #2 (12.11.20), #3 (01.18.21), #4 (02.19.21)  - FA (06.26.20) shows Severe NPDR OU w/ Late leaking MA OU, Enlarged FAZ OU, No NV OU, No significant hyperfluorescence of disc  - BCVA: OD 20/30 - stable; OS 20/60 from 20/150  - OCT shows interval improvement in IRF OU  - recommend IVE OD #5 and IVE #6 OS today, 03.19.21  - pt wishes to proceed  -  RBA of procedure discussed, questions answered  - informed consent obtained  - Eylea informed consent form signed and scanned on 01.18.2021  - see procedure note  - [email or web address] Benefits Investigation initiated 10.16.2020 -- approved for 2021  - f/u in 4 weeks -- DFE/OCT/possible injection OU  2. Retinal Edema OU  - based on FA, suspect majority of macular edema due to DM as above, but there may be some edema secondary to post-op CME  - cont PF qid OU and ketorolac qid OU -- pt  ran out of drops a couple of weeks ago  - history of difficulty with compliance at work  - monitor  - f/u 4 wks  3. Choroidal Nevus OS  - located at 1200, mild elevation, +drusen, no SRF  - baseline optos pictures obtained, 06.26.20  - discussed possible referral to ocular oncologist at [facility] for evaluation/management  4,5.Hypertensive retinopathy OU  - discussed importance of tight BP control  - monitor  6. Pseudophakia OU  - s/p CE/IOL OU (OD on 06.03.20 and OS 06.10.20 by Dr. Gershon Crane)  - beautiful surgeries w/ IOLs in excellent position  - macular edema limiting vision as above  - monitor  7. Ocular hypertension OD  - IOP 18 OD  - cont Cosopt BID OD  - monitor   Ophthalmic Meds Ordered this visit:  Meds ordered this encounter  Medications  . prednisoLONE acetate (PRED FORTE) 1 % ophthalmic suspension    Sig: Place 1 drop into both eyes 4 (four) times daily.    Dispense:  15 mL    Refill:  2  . ketorolac (ACULAR) 0.5 % ophthalmic solution    Sig: Place 1 drop into both eyes 4 (four) times daily.    Dispense:  10 mL    Refill:  3  . dorzolamide-timolol (COSOPT) 22.3-6.8 MG/ML ophthalmic solution    Sig: Place 1 drop into the right eye 2 (two) times daily.    Dispense:  10 mL    Refill:  5  . aflibercept (EYLEA) SOLN 2 mg  . aflibercept (EYLEA) SOLN 2 mg      Return in about 4 weeks (around 05/18/2019) for f/u NPDR OU, DFE, OCT.  There are no Patient Instructions on file for this visit.   Explained the diagnoses, plan, and follow up with the patient and they expressed understanding.  Patient expressed understanding of the importance of proper follow up care.   This document serves as a record of services personally performed by Gardiner Sleeper, MD, PhD. It was created on their behalf by Ernest Mallick, OA, an ophthalmic assistant. The creation of this record is the provider's dictation and/or activities during the visit.    Electronically signed by:  Ernest Mallick, OA 03.19.2021 8:28 AM  Gardiner Sleeper, M.D., Ph.D. Diseases & Surgery of the Retina and Vitreous [facility]  I have reviewed the above documentation for accuracy and completeness, and I agree with the above. Gardiner Sleeper, M.D., Ph.D. 04/23/19 8:28 AM   Abbreviations: M myopia (nearsighted); A astigmatism; H hyperopia (farsighted); P presbyopia; Mrx spectacle prescription;  CTL contact lenses; OD right eye; OS left eye; OU both eyes  XT exotropia; ET esotropia; PEK punctate epithelial keratitis; PEE punctate epithelial erosions; DES dry eye syndrome; MGD meibomian gland dysfunction; ATs artificial tears; PFAT's preservative free artificial tears; [place] nuclear sclerotic cataract; PSC posterior subcapsular cataract; ERM epi-retinal membrane; PVD posterior vitreous detachment; RD retinal detachment; DM diabetes mellitus;  DR diabetic retinopathy; NPDR non-proliferative diabetic retinopathy; PDR proliferative diabetic retinopathy; CSME clinically significant macular edema; DME diabetic macular edema; dbh dot blot hemorrhages; CWS cotton wool spot; POAG primary open angle glaucoma; C/D cup-to-disc ratio; HVF humphrey visual field; GVF goldmann visual field; OCT optical coherence tomography; IOP intraocular pressure; BRVO Branch retinal vein occlusion; CRVO central retinal vein occlusion; CRAO central retinal artery occlusion; BRAO branch retinal artery occlusion; RT retinal tear; SB scleral buckle; PPV pars plana vitrectomy; VH Vitreous hemorrhage; PRP panretinal laser photocoagulation; IVK intravitreal kenalog; VMT vitreomacular traction; MH Macular hole;  NVD neovascularization of the disc; NVE neovascularization elsewhere; AREDS age related eye disease study; ARMD age related macular degeneration; POAG primary open angle glaucoma; EBMD epithelial/anterior basement membrane dystrophy; ACIOL anterior chamber intraocular lens; IOL intraocular lens; PCIOL posterior chamber  intraocular lens; Phaco/IOL phacoemulsification with intraocular lens placement; [place] photorefractive keratectomy; LASIK laser assisted in situ keratomileusis; HTN hypertension; DM diabetes mellitus; COPD chronic obstructive pulmonary disease

## 2019-04-23 ENCOUNTER — Encounter (INDEPENDENT_AMBULATORY_CARE_PROVIDER_SITE_OTHER): Payer: BC Managed Care – PPO | Admitting: Ophthalmology

## 2019-04-23 MED ORDER — AFLIBERCEPT 2MG/0.05ML IZ SOLN FOR KALEIDOSCOPE
2.0000 mg | INTRAVITREAL | Status: AC | PRN
  Administered 2019-04-23: 2 mg via INTRAVITREAL

## 2019-04-25 ENCOUNTER — Telehealth: Payer: Self-pay | Admitting: Pulmonary Disease

## 2019-04-25 ENCOUNTER — Other Ambulatory Visit: Payer: Self-pay | Admitting: Pulmonary Disease

## 2019-04-25 MED ORDER — DOXYCYCLINE HYCLATE 100 MG PO TABS: 100.0000 mg | ORAL_TABLET | Freq: Two times a day (BID) | ORAL | 0 refills | Status: DC

## 2019-04-25 NOTE — Telephone Encounter (Signed)
Kindly let patient know that I saw the interaction with his medications with the antibiotic I recently prescribed  I sent in a prescription for doxycycline

## 2019-04-25 NOTE — Telephone Encounter (Signed)
LMTCB and will hold in triage to f/u on

## 2019-04-26 NOTE — Telephone Encounter (Signed)
Left message for patient to call back  

## 2019-04-27 NOTE — Telephone Encounter (Signed)
Called pt's pharmacy to see if pt had ever picked up doxy abx and was told that abx is still at pharmacy waiting to be picked up.  Attempted to call pt to let him know this info and the message stated by AO but unable to reach pt. Left pt a detailed message on machine in regards to the message from AO and that abx was at pharmacy. Nothing further needed.

## 2019-05-01 ENCOUNTER — Ambulatory Visit
Admission: RE | Admit: 2019-05-01 | Discharge: 2019-05-01 | Disposition: A | Discharge status: Home / Self Care | Payer: BC Managed Care – PPO | Source: Ambulatory Visit | Attending: Family Medicine | Admitting: Family Medicine

## 2019-05-01 DIAGNOSIS — J439 Emphysema, unspecified: Secondary | ICD-10-CM | POA: Diagnosis not present

## 2019-05-01 DIAGNOSIS — R911 Solitary pulmonary nodule: Secondary | ICD-10-CM

## 2019-05-01 DIAGNOSIS — R918 Other nonspecific abnormal finding of lung field: Secondary | ICD-10-CM | POA: Diagnosis not present

## 2019-05-06 ENCOUNTER — Other Ambulatory Visit: Payer: Self-pay | Admitting: Urology

## 2019-05-08 DIAGNOSIS — E1169 Type 2 diabetes mellitus with other specified complication: Secondary | ICD-10-CM | POA: Diagnosis not present

## 2019-05-08 DIAGNOSIS — E113413 Type 2 diabetes mellitus with severe nonproliferative diabetic retinopathy with macular edema, bilateral: Secondary | ICD-10-CM | POA: Diagnosis not present

## 2019-05-08 DIAGNOSIS — E1140 Type 2 diabetes mellitus with diabetic neuropathy, unspecified: Secondary | ICD-10-CM | POA: Diagnosis not present

## 2019-05-08 DIAGNOSIS — E1129 Type 2 diabetes mellitus with other diabetic kidney complication: Secondary | ICD-10-CM | POA: Diagnosis not present

## 2019-05-14 DIAGNOSIS — D509 Iron deficiency anemia, unspecified: Secondary | ICD-10-CM | POA: Diagnosis not present

## 2019-05-14 NOTE — Progress Notes (Addendum)
[facility] Note  05/18/2019     CHIEF COMPLAINT Patient presents for Retina Follow Up   HISTORY OF PRESENT ILLNESS: Juan [name] is a 59 y.o. male who presents to the clinic today for:   HPI    Retina Follow Up    Patient presents with  Diabetic Retinopathy.  In both eyes.  This started 4 weeks ago.  Severity is moderate.  I, the attending physician,  performed the HPI with the patient and updated documentation appropriately.          Comments    Patient here for 4 weeks for retina follow up for NPDR OU. Patient states vision doing fair. May be a little better. No eye pain. Dealing with cardio vascular disease. On Prednisone 20 mg for 7 more days.        Last edited by Bernarda Caffey, MD on 05/18/2019  3:38 PM. (History)    Patient states vision is pretty good in the right eye, left eye is about the same   Referring physician: Harlan Stains, MD [place],  [place] [address]  HISTORICAL INFORMATION:   Selected notes from the MEDICAL RECORD NUMBER Referred by Dr. Rutherford Guys for concern of CME s/p cataract sx LEE:  Ocular Hx- PMH-   CURRENT MEDICATIONS: Current Outpatient Medications (Ophthalmic Drugs)  Medication Sig  . dorzolamide-timolol (COSOPT) 22.3-6.8 MG/ML ophthalmic solution Place 1 drop into the right eye 2 (two) times daily.  Marland Kitchen ketorolac (ACULAR) 0.5 % ophthalmic solution Place 1 drop into both eyes 4 (four) times daily.  Marland Kitchen ofloxacin (OCUFLOX) 0.3 % ophthalmic solution Place 1 drop into both eyes 3 (three) times daily.  . prednisoLONE acetate (PRED FORTE) 1 % ophthalmic suspension Place 1 drop into both eyes 4 (four) times daily.   No current facility-administered medications for this visit. (Ophthalmic Drugs)   Current Outpatient Medications (Other)  Medication Sig  . ADVAIR DISKUS 500-50 MCG/DOSE AEPB Inhale 1 puff into the lungs 2 (two) times daily.  Marland Kitchen albuterol (PROVENTIL HFA;VENTOLIN HFA) 108  (90 BASE) MCG/ACT inhaler Inhale 1-2 puffs into the lungs every 6 (six) hours as needed for wheezing or shortness of breath.   Marland Kitchen albuterol (PROVENTIL) (2.5 MG/3ML) 0.083% nebulizer solution INHALE CONTENTS OF ONE VIAL IN NEBULIZER THREE TIMES DAILY AS NEEDED  . alfuzosin (UROXATRAL) 10 MG 24 hr tablet TAKE 1 TABLET BY MOUTH EVERYDAY AT BEDTIME  . aspirin EC 81 MG tablet Take 81 mg by mouth daily.  Marland Kitchen atorvastatin (LIPITOR) 40 MG tablet Take 40 mg by mouth daily.  Marland Kitchen doxycycline (VIBRA-TABS) 100 MG tablet Take 1 tablet (100 mg total) by mouth 2 (two) times daily.  . Empagliflozin-metFORMIN HCl (SYNJARDY) 12.06-998 MG TABS Take by mouth 2 (two) times a day.  . esomeprazole (NEXIUM) 40 MG capsule Take 40 mg by mouth daily at 12 noon.  Marland Kitchen glimepiride (AMARYL) 4 MG tablet Take 4 mg by mouth 2 (two) times daily.  Marland Kitchen HYDROMET 5-1.5 MG/5ML syrup TAKE ONE TEASPOONFUL BY MOUTH EVERY 4 TO 6 HOURS AS NEEDED FOR COUGH  . insulin glargine, 2 Unit Dial, (TOUJEO MAX SOLOSTAR) 300 UNIT/ML Solostar Pen Inject into the skin.  Marland Kitchen KOMBIGLYZE XR 2.06-998 MG TB24 Take 2 tablets by mouth at bedtime.   Marland Kitchen LANTUS SOLOSTAR 100 UNIT/ML Solostar Pen Inject 42 Units into the skin at bedtime.   Marland Kitchen lisinopril (ZESTRIL) 40 MG tablet Take 40 mg by mouth daily.  Marland Kitchen lisinopril-hydrochlorothiazide (PRINZIDE,ZESTORETIC)  20-25 MG per tablet Take 1 tablet by mouth daily.  . Multiple Vitamins-Minerals (CENTRUM SILVER 50+MEN PO) as directed.  . nitrofurantoin, macrocrystal-monohydrate, (MACROBID) 100 MG capsule Take 100 mg by mouth 2 (two) times daily.   No current facility-administered medications for this visit. (Other)      REVIEW OF SYSTEMS: ROS    Positive for: Endocrine, Cardiovascular, Eyes   Negative for: Constitutional, Gastrointestinal, Neurological, Skin, Genitourinary, Musculoskeletal, HENT, Respiratory, Psychiatric, Allergic/Imm, Heme/Lymph   Last edited by Theodore Demark, COA on 05/18/2019  3:04 PM. (History)        ALLERGIES Allergies  Allergen Reactions  . Naproxen Shortness Of Breath  . Naproxen Sodium Shortness Of Breath  . Penicillins Rash    Has patient had a PCN reaction causing immediate rash, facial/tongue/throat swelling, SOB or lightheadedness with hypotension: Yes Has patient had a PCN reaction causing severe rash involving mucus membranes or skin necrosis: No Has patient had a PCN reaction that required hospitalization No Has patient had a PCN reaction occurring within the last 10 years: No If all of the above answers are "NO", then may proceed with Cephalosporin use.  Has patient had a PCN reaction causing immediate rash, facial/tongue/throat swelling, SOB or lightheadedness with hypotension: Yes Has patient had a PCN reaction causing severe rash involving mucus membranes or skin necrosis: No Has patient had a PCN reaction that required hospitalization No Has patient had a PCN reaction occurring within the last 10 years: No If all of the above answers are "NO", then may proceed with Cephalosporin use.     PAST MEDICAL HISTORY Past Medical History:  Diagnosis Date  . Arthritis   . Asthma   . COPD (chronic obstructive pulmonary disease) ([place])   . Diabetes mellitus    Type 2  . Diabetic retinopathy ([place])    NPDR OU  . GERD (gastroesophageal reflux disease)   . H/O hiatal hernia   . History of kidney stones   . Hyperlipemia   . Hypertension   . Hypertensive retinopathy    OU  . Nasal polyps   . Pneumonia 2014  . Shortness of breath   . Sleep apnea    pt has CPAP but is unable to use it d/t having polyps in his nose. Pt stated "I need to call and get a CPAP mask instead"  . Stomach ulcer    Past Surgical History:  Procedure Laterality Date  . CATARACT EXTRACTION Right 07/05/2018   Dr. Gershon Crane  . CATARACT EXTRACTION Left 07/12/2018   Dr. Gershon Crane  . CHOLECYSTECTOMY N/A 01/23/2015   Procedure: LAPAROSCOPIC CHOLECYSTECTOMY ;  Surgeon: Georganna Skeans, MD;  Location:  [facility];  Service: General;  Laterality: N/A;  . COLONOSCOPY W/ POLYPECTOMY    . ESOPHAGOGASTRODUODENOSCOPY    . EYE SURGERY    . KNEE ARTHROSCOPY  03/24/2011   Procedure: ARTHROSCOPY KNEE;  Surgeon: Alta Corning, MD;  Location: [facility];  Service: Orthopedics;  Laterality: Right;  partial medial menisectomy, chondroplaty patella, and medial medial plica    FAMILY HISTORY Family History  Problem Relation Age of Onset  . Diabetes Father     SOCIAL HISTORY Social History   Tobacco Use  . Smoking status: Former Smoker    Years: 20.00    Types: Cigarettes  . Smokeless tobacco: Current User    Types: Chew  . Tobacco comment: 1 can/daily  Substance Use Topics  . Alcohol use: Yes    Comment: social, 1x weekly  . Drug  use: No         OPHTHALMIC EXAM:  Base Eye Exam    Visual Acuity (Snellen - Linear)      Right Left   Dist [place] 20/30 20/70 -2   Dist ph [place] 20/20 -1 NI       Tonometry (Tonopen, 3:00 PM)      Right Left   Pressure 18 16       Pupils      Dark Light Shape React APD   Right 3 2 Round Brisk None   Left 3 2 Round Brisk None       Visual Fields (Counting fingers)      Left Right    Full Full       Extraocular Movement      Right Left    Full, Ortho Full, Ortho       Neuro/Psych    Oriented x3: Yes   Mood/Affect: Normal       Dilation    Both eyes: 1.0% Mydriacyl, 2.5% Phenylephrine @ 3:00 PM        Slit Lamp and Fundus Exam    Slit Lamp Exam      Right Left   Lids/Lashes Dermatochalasis - upper lid, Telangiectasia, mild Meibomian gland dysfunction Dermatochalasis - upper lid, Telangiectasia, mild Meibomian gland dysfunction   Conjunctiva/Sclera White and quiet, +concretions on palpebral conj Mild nasal Pinguecula, +concretions on palpebral conj   Cornea Mild Arcus, Well healed temporal cataract wounds Arcus, 1+ Descemet's folds, Well healed temporal cataract wounds   Anterior Chamber deep, 1+ cell/pigment deep, 1+  cell/pigment   Iris Round and dilated, mild patch of atrophy at 0800, no NVI Round and dilated, No NVI   Lens PC IOL in good position PC IOL in good position   Vitreous Vitreous syneresis Vitreous syneresis       Fundus Exam      Right Left   Disc Pink and Sharp Pallor, Sharp rim   C/D Ratio 0.4 0.3   Macula Blunted foveal reflex, +cystic edema / IRF -- persistent, but slightly improved, scattered MA and exudate temporal mac Blunted foveal reflex, +central edema -- improving, scattered DBH and MAs, puncatate exudates superior to fovea - improving   Vessels Vascular attenuation, Tortuous Vascular attenuation, Tortuous   Periphery Attached, scattered MA Attached, pigmented choroidal nevus superiorly with mild elevation, +drusen, no SRF          IMAGING AND PROCEDURES  Imaging and Procedures for _0 @  OCT, Retina - OU - Both Eyes       Right Eye Quality was good. Central Foveal Thickness: 338. Progression has improved. Findings include abnormal foveal contour, intraretinal hyper-reflective material, intraretinal fluid, outer retinal atrophy, no SRF (Mild interval improvement in IRF).   Left Eye Quality was good. Central Foveal Thickness: 466. Progression has improved. Findings include intraretinal fluid, intraretinal hyper-reflective material, abnormal foveal contour, no SRF (Persistent IRF/IRHM -- minimal improvement from prior, Sub-RPE hyper-reflective mass consistent with choroidal nevus--superior to disc, caught on widefield -- stable from prior).   Notes *Images captured and stored on drive  Diagnosis / Impression: Severe DME OU OD: mild interval improvement in IRF OS: Persistent IRF/IRHM -- minimal improvement from prior, Sub-RPE hyper-reflective mass consistent with choroidal nevus--superior to disc, caught on widefield -- stable from prior   Clinical management:  See below  Abbreviations: NFP - Normal foveal profile. CME - cystoid macular edema. PED - pigment  epithelial detachment. IRF - intraretinal fluid. SRF - subretinal fluid.  EZ - ellipsoid zone. ERM - epiretinal membrane. ORA - outer retinal atrophy. ORT - outer retinal tubulation. SRHM - subretinal hyper-reflective material        Intravitreal Injection, Pharmacologic Agent - OD - Right Eye       Time Out 05/18/2019. 3:13 PM. Confirmed correct patient, procedure, site, and patient consented.   Anesthesia Topical anesthesia was used. Anesthetic medications included Lidocaine 2%, Proparacaine 0.5%.   Procedure Preparation included 5% betadine to ocular surface, eyelid speculum. A (32g) needle was used.   Injection:  2 mg aflibercept Alfonse Flavors) SOLN   NDC: A3590391, Lot: [id number], Expiration date: 09/28/2019   Route: Intravitreal, Site: Right Eye, Waste: 0.05 mL  Post-op Post injection exam found visual acuity of at least counting fingers. The patient tolerated the procedure well. There were no complications. The patient received written and verbal post procedure care education.   Notes An AC tap was performed following injection due to elevated IOP using a 30 gauge needle on a syringe with the plunger removed. The needle was placed at the limbus at 7 oclock and approximately 0.08 cc of aqueous was removed from the anterior chamber. Betadine was applied to the tap area before and after the paracentesis was performed. There were no complications. The patient tolerated the procedure well. The IOP was rechecked and was found to be ~8 mmHg by palpation.        Intravitreal Injection, Pharmacologic Agent - OS - Left Eye       Time Out 05/18/2019. 3:14 PM. Confirmed correct patient, procedure, site, and patient consented.   Anesthesia Topical anesthesia was used. Anesthetic medications included Lidocaine 2%, Proparacaine 0.5%.   Procedure Preparation included 5% betadine to ocular surface, eyelid speculum. A (32g) needle was used.   Injection:  2 mg aflibercept Alfonse Flavors) SOLN    NDC: A3590391, Lot: [id number], Expiration date: 10/29/2019   Route: Intravitreal, Site: Left Eye, Waste: 0.05 mL  Post-op Post injection exam found visual acuity of at least counting fingers. The patient tolerated the procedure well. There were no complications. The patient received written and verbal post procedure care education.                 ASSESSMENT/PLAN:    ICD-10-CM   1. Severe nonproliferative diabetic retinopathy of both eyes with macular edema associated with type 2 diabetes mellitus (HCC)  H41.9379 Intravitreal Injection, Pharmacologic Agent - OD - Right Eye    Intravitreal Injection, Pharmacologic Agent - OS - Left Eye    aflibercept (EYLEA) SOLN 2 mg    aflibercept (EYLEA) SOLN 2 mg  2. 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 Essential hypertension  I10   5. Hypertensive retinopathy of both eyes  H35.033   6. Pseudophakia of both eyes  Z96.1   7. Ocular hypertension of right eye  H40.051     1. Severe nonproliferative diabetic retinopathy with DME, OU (OS>OD)  - s/p IVA OD #1 (06.26.20), #2 (08.14.20), #3 (09.17.20), #4 (10.16.20)  - s/p IVA OS #1 (07.17.20), #2 (08.14.20), #3 (09.17.20)  - s/p IVA OU (08.14.20), #3 (09.17.20)  - s/p IVE OS #1 (10.16.20) - sample, #2 (11.13.20), #3 (12.11.20), #4 (01.18.21), #5 (02.19.21), #6 (03.19.21)  - s/p IVE OD #1 (11.13.20), #2 (12.11.20), #3 (01.18.21), #4 (02.19.21), #5 (03.19.21)  - FA (06.26.20) shows Severe NPDR OU w/ Late leaking MA OU, Enlarged FAZ OU, No NV OU, No  significant hyperfluorescence of disc  - BCVA: OD 20/20 - improved; OS 20/70 from 20/60  - OCT shows interval improvement in IRF OD; persistent IRF/IRHM OS  - recommend IVE OD #6 and IVE #7 OS today, 04.16.21  - pt wishes to proceed  - RBA of procedure discussed, questions answered  - informed consent obtained  - Eylea informed consent form signed and scanned on 01.18.2021  - see procedure  note  - Eylea4U Benefits Investigation initiated 10.16.2020 -- approved for 2021  - f/u in 4 weeks -- DFE/OCT/possible injection OU  2. Retinal Edema OU  - based on FA, suspect majority of macular edema due to DM as above, but there may be some edema secondary to post-op CME  - cont PF qid OU and ketorolac qid OU -- pt ran out of drops a couple of weeks ago  - history of difficulty with compliance at work  - monitor  - f/u 4 wks  3. Choroidal Nevus OS  - located at 1200, mild elevation, +drusen, no SRF  - baseline optos pictures obtained, 06.26.20  - discussed possible referral to ocular oncologist at [facility] for evaluation/management  4,5.Hypertensive retinopathy OU  - discussed importance of tight BP control  - monitor  6. Pseudophakia OU  - s/p CE/IOL OU (OD on 06.03.20 and OS 06.10.20 by Dr. Gershon Crane)  - beautiful surgeries w/ IOLs in excellent position  - macular edema limiting vision as above  - monitor  7. Ocular hypertension OD  - IOP 18 OD  - cont Cosopt BID OD  - monitor   Ophthalmic Meds Ordered this visit:  Meds ordered this encounter  Medications  . aflibercept (EYLEA) SOLN 2 mg  . aflibercept (EYLEA) SOLN 2 mg      Return in about 4 weeks (around 06/15/2019) for f/u NPDR OU, DFE, OCT.  There are no Patient Instructions on file for this visit.   Explained the diagnoses, plan, and follow up with the patient and they expressed understanding.  Patient expressed understanding of the importance of proper follow up care.   This document serves as a record of services personally performed by Gardiner Sleeper, MD, PhD. It was created on their behalf by Ernest Mallick, OA, an ophthalmic assistant. The creation of this record is the provider's dictation and/or activities during the visit.    Electronically signed by: Ernest Mallick, OA 04.12.2021 4:37 PM  Gardiner Sleeper, M.D., Ph.D. Diseases & Surgery of the Retina and Vitreous [facility]  I have reviewed the above documentation for accuracy and completeness, and I agree with the above. Gardiner Sleeper, M.D., Ph.D. 05/18/19 4:37 PM    Abbreviations: M myopia (nearsighted); A astigmatism; H hyperopia (farsighted); P presbyopia; Mrx spectacle prescription;  CTL contact lenses; OD right eye; OS left eye; OU both eyes  XT exotropia; ET esotropia; PEK punctate epithelial keratitis; PEE punctate epithelial erosions; DES dry eye syndrome; MGD meibomian gland dysfunction; ATs artificial tears; PFAT's preservative free artificial tears; H. Rivera Colon nuclear sclerotic cataract; PSC posterior subcapsular cataract; ERM epi-retinal membrane; PVD posterior vitreous detachment; RD retinal detachment; DM diabetes mellitus; DR diabetic retinopathy; NPDR non-proliferative diabetic retinopathy; PDR proliferative diabetic retinopathy; CSME clinically significant macular edema; DME diabetic macular edema; dbh dot blot hemorrhages; CWS cotton wool spot; POAG primary open angle glaucoma; C/D cup-to-disc ratio; HVF humphrey visual field; GVF goldmann visual field; OCT optical coherence tomography; IOP intraocular pressure; BRVO Branch retinal vein occlusion; CRVO central retinal vein  occlusion; CRAO central retinal artery occlusion; BRAO branch retinal artery occlusion; RT retinal tear; SB scleral buckle; PPV pars plana vitrectomy; VH Vitreous hemorrhage; PRP panretinal laser photocoagulation; IVK intravitreal kenalog; VMT vitreomacular traction; MH Macular hole;  NVD neovascularization of the disc; NVE neovascularization elsewhere; AREDS age related eye disease study; ARMD age related macular degeneration; POAG primary open angle glaucoma; EBMD epithelial/anterior basement membrane dystrophy; ACIOL anterior chamber intraocular lens; IOL intraocular lens; PCIOL posterior chamber intraocular lens; Phaco/IOL phacoemulsification with intraocular lens placement; [place] photorefractive keratectomy; LASIK laser assisted in situ  keratomileusis; HTN hypertension; DM diabetes mellitus; COPD chronic obstructive pulmonary disease

## 2019-05-17 DIAGNOSIS — R1013 Epigastric pain: Secondary | ICD-10-CM | POA: Diagnosis not present

## 2019-05-17 DIAGNOSIS — D509 Iron deficiency anemia, unspecified: Secondary | ICD-10-CM | POA: Diagnosis not present

## 2019-05-17 DIAGNOSIS — J449 Chronic obstructive pulmonary disease, unspecified: Secondary | ICD-10-CM | POA: Diagnosis not present

## 2019-05-17 DIAGNOSIS — I2510 Atherosclerotic heart disease of native coronary artery without angina pectoris: Secondary | ICD-10-CM | POA: Diagnosis not present

## 2019-05-17 DIAGNOSIS — I700 Atherosclerosis of aorta: Secondary | ICD-10-CM | POA: Diagnosis not present

## 2019-05-18 ENCOUNTER — Ambulatory Visit (INDEPENDENT_AMBULATORY_CARE_PROVIDER_SITE_OTHER): Payer: BC Managed Care – PPO | Admitting: Ophthalmology

## 2019-05-18 ENCOUNTER — Other Ambulatory Visit: Payer: Self-pay

## 2019-05-18 ENCOUNTER — Encounter (INDEPENDENT_AMBULATORY_CARE_PROVIDER_SITE_OTHER): Payer: Self-pay | Admitting: Ophthalmology

## 2019-05-18 DIAGNOSIS — D3132 Benign neoplasm of left choroid: Secondary | ICD-10-CM | POA: Diagnosis not present

## 2019-05-18 DIAGNOSIS — H40051 Ocular hypertension, right eye: Secondary | ICD-10-CM

## 2019-05-18 DIAGNOSIS — Z961 Presence of intraocular lens: Secondary | ICD-10-CM

## 2019-05-18 DIAGNOSIS — I10 Essential (primary) hypertension: Secondary | ICD-10-CM | POA: Diagnosis not present

## 2019-05-18 DIAGNOSIS — E113413 Type 2 diabetes mellitus with severe nonproliferative diabetic retinopathy with macular edema, bilateral: Secondary | ICD-10-CM

## 2019-05-18 DIAGNOSIS — H3581 Retinal edema: Secondary | ICD-10-CM | POA: Diagnosis not present

## 2019-05-18 DIAGNOSIS — H35033 Hypertensive retinopathy, bilateral: Secondary | ICD-10-CM

## 2019-05-18 MED ORDER — AFLIBERCEPT 2MG/0.05ML IZ SOLN FOR KALEIDOSCOPE
2.0000 mg | INTRAVITREAL | Status: AC | PRN
  Administered 2019-05-18: 2 mg via INTRAVITREAL

## 2019-05-18 MED ORDER — AFLIBERCEPT 2MG/0.05ML IZ SOLN FOR KALEIDOSCOPE
2.0000 mg | INTRAVITREAL | Status: AC | PRN
  Administered 2019-05-18: 17:00:00 2 mg via INTRAVITREAL

## 2019-06-01 ENCOUNTER — Ambulatory Visit: Payer: BC Managed Care – PPO | Admitting: Internal Medicine

## 2019-06-01 ENCOUNTER — Other Ambulatory Visit: Payer: Self-pay

## 2019-06-01 VITALS — BP 142/82 | HR 95 | Ht 69.0 in | Wt 227.0 lb

## 2019-06-01 DIAGNOSIS — I2510 Atherosclerotic heart disease of native coronary artery without angina pectoris: Secondary | ICD-10-CM | POA: Diagnosis not present

## 2019-06-01 DIAGNOSIS — R079 Chest pain, unspecified: Secondary | ICD-10-CM | POA: Diagnosis not present

## 2019-06-01 NOTE — Patient Instructions (Signed)
Medication Instructions:  NO CHANGES  *If you need a refill on your cardiac medications before your next appointment, please call your pharmacy*   Lab Work: NOT NEEDED  Testing/Procedure  WILL BE SCHEDULE AT [facility] TEST 3 DAYS ( [place] )  PRIOR TO TEST,  QUARANTINE INT BETWEEN TIME  Your physician has requested that you have en exercise stress myoview. Please follow instruction sheet, as given.    Follow-Up: At [facility], you and your health needs are our priority.  As part of our continuing mission to provide you with exceptional heart care, we have created designated Provider Care Teams.  These Care Teams include your primary Cardiologist (physician) and Advanced Practice Providers (APPs -  Physician Assistants and Nurse Practitioners) who all work together to provide you with the care you need, when you need it.  We recommend signing up for the patient portal called "MyChart".  Sign up information is provided on this After Visit Summary.  MyChart is used to connect with patients for Virtual Visits (Telemedicine).  Patients are able to view lab/test results, encounter notes, upcoming appointments, etc.  Non-urgent messages can be sent to your provider as well.   To learn more about what you can do with MyChart, go to [email or web address].    Your next appointment:    3 TO 4 week(s)  The format for your next appointment:   Either In Person or Virtual  Provider:   Cherlynn Kaiser, MD   Other Instructions N/A

## 2019-06-01 NOTE — Progress Notes (Deleted)
0 0

## 2019-06-02 NOTE — Progress Notes (Signed)
Cardiology Office Note:    Date:  06/02/2019   ID:  Juan [name], DOB [birth year], MRN [id number]  PCP:  Harlan Stains, MD  Cardiologist:  No primary care provider on file.  Electrophysiologist:  None   Referring MD: Chipper Herb Family M*   Chief Complaint: Coronary artery calcifications  History of Present Illness:    Juan [name] is a 59 y.o. male with a history of DM, HTN, HLD, COPD/chronic bronchitis with recent exacerbations, OSA on CPAP who presents for coronary artery calcifications noted on chest CT.   We discussed the natural history and implications of CAC in detail, including work up and management.   He is largely asymptomatic from a cardiac standpoint, and denies resting or exertional angina. He initially states that he's asymptomatic while being active, he and his wife liked to go dancing prior to [place] and he notes after detailed conversation that he will have shortness of breath and chest tightness and have to stop. He is currently working with his pulmonologist on his COPD (former smoker) which he has recently experienced exacerbations.   The patient denies chest pain, chest pressure, palpitations, PND, orthopnea, or leg swelling. Denies cough, fever, chills. Denies nausea, vomiting. Denies syncope or presyncope. Denies dizziness or lightheadedness.   Past Medical History:  Diagnosis Date  . Arthritis   . Asthma   . COPD (chronic obstructive pulmonary disease) ([place])   . Diabetes mellitus    Type 2  . Diabetic retinopathy ([place])    NPDR OU  . GERD (gastroesophageal reflux disease)   . H/O hiatal hernia   . History of kidney stones   . Hyperlipemia   . Hypertension   . Hypertensive retinopathy    OU  . Nasal polyps   . Pneumonia 2014  . Shortness of breath   . Sleep apnea    pt has CPAP but is unable to use it d/t having polyps in his nose. Pt stated "I need to call and get a CPAP mask instead"  . Stomach ulcer     Past Surgical History:    Procedure Laterality Date  . CATARACT EXTRACTION Right 07/05/2018   Dr. Gershon Crane  . CATARACT EXTRACTION Left 07/12/2018   Dr. Gershon Crane  . CHOLECYSTECTOMY N/A 01/23/2015   Procedure: LAPAROSCOPIC CHOLECYSTECTOMY ;  Surgeon: Georganna Skeans, MD;  Location: [facility];  Service: General;  Laterality: N/A;  . COLONOSCOPY W/ POLYPECTOMY    . ESOPHAGOGASTRODUODENOSCOPY    . EYE SURGERY    . KNEE ARTHROSCOPY  03/24/2011   Procedure: ARTHROSCOPY KNEE;  Surgeon: Alta Corning, MD;  Location: [facility];  Service: Orthopedics;  Laterality: Right;  partial medial menisectomy, chondroplaty patella, and medial medial plica    Current Medications: No outpatient medications have been marked as taking for the 06/01/19 encounter (Office Visit) with Elouise Munroe, MD.     Allergies:   Naproxen, Naproxen sodium, and Penicillins   Social History   Socioeconomic History  . Marital status: Single    Spouse name: Not on file  . Number of children: Not on file  . Years of education: Not on file  . Highest education level: Not on file  Occupational History  . Not on file  Tobacco Use  . Smoking status: Former Smoker    Years: 20.00    Types: Cigarettes  . Smokeless tobacco: Current User    Types: Chew  . Tobacco comment: 1 can/daily  Substance and Sexual Activity  . Alcohol use:  Yes    Comment: social, 1x weekly  . Drug use: No  . Sexual activity: Never  Other Topics Concern  . Not on file  Social History Narrative  . Not on file   Social Determinants of Health   Financial Resource Strain:   . Difficulty of Paying Living Expenses:   Food Insecurity:   . Worried About Charity fundraiser in the Last Year:   . Arboriculturist in the Last Year:   Transportation Needs:   . Film/video editor (Medical):   Marland Kitchen Lack of Transportation (Non-Medical):   Physical Activity:   . Days of Exercise per Week:   . Minutes of Exercise per Session:   Stress:   . Feeling of Stress :    Social Connections:   . Frequency of Communication with Friends and Family:   . Frequency of Social Gatherings with Friends and Family:   . Attends Religious Services:   . Active Member of Clubs or Organizations:   . Attends Archivist Meetings:   Marland Kitchen Marital Status:      Family History: The patient's family history includes Diabetes in his father.  ROS:   Please see the history of present illness.    All other systems reviewed and are negative.  EKGs/Labs/Other Studies Reviewed:    The following studies were reviewed today:  EKG:  NSR  I have independently reviewed the images from CT chest 05/01/19 and reviewed these images with the patient during the visit. Severe LM and LAD calcifications, 3 vessel CAC.  Recent Labs: 04/11/2019: BUN 15; Creatinine, Ser 1.17; Hemoglobin 9.8; Platelets 118; Potassium 3.8; Sodium 141  Recent Lipid Panel    Component Value Date/Time   CHOL  03/05/2010 0310    90        ATP III CLASSIFICATION:  <200     mg/dL   Desirable  200-239  mg/dL   Borderline High  >=240    mg/dL   High          TRIG 66 03/05/2010 0310   HDL 32 (L) 03/05/2010 0310   CHOLHDL 2.8 03/05/2010 0310   VLDL 13 03/05/2010 0310   LDLCALC  03/05/2010 0310    45        Total Cholesterol/HDL:CHD Risk Coronary Heart Disease Risk Table                     Men   Women  1/2 Average Risk   3.4   3.3  Average Risk       5.0   4.4  2 X Average Risk   9.6   7.1  3 X Average Risk  23.4   11.0        Use the calculated Patient Ratio above and the CHD Risk Table to determine the patient's CHD Risk.        ATP III CLASSIFICATION (LDL):  <100     mg/dL   Optimal  100-129  mg/dL   Near or Above                    Optimal  130-159  mg/dL   Borderline  160-189  mg/dL   High  >190     mg/dL   Very High    Physical Exam:    VS:  BP (!) 142/82   Pulse 95   Ht 5\' 9"  (1.753 m)   Wt 227 lb (103 kg)   SpO2  97%   BMI 33.52 kg/m     Wt Readings from Last 5 Encounters:   06/01/19 227 lb (103 kg)  04/19/19 232 lb 6.4 oz (105.4 kg)  04/11/19 225 lb (102.1 kg)  12/08/15 260 lb (117.9 kg)  03/29/15 240 lb (108.9 kg)     Constitutional: No acute distress Eyes: sclera non-icteric, normal conjunctiva and lids ENMT: normal dentition, moist mucous membranes Cardiovascular: regular rhythm, normal rate, no murmurs. S1 and S2 normal. Radial pulses normal bilaterally. No jugular venous distention.  Respiratory: clear to auscultation bilaterally GI : normal bowel sounds, soft and nontender. No distention.   MSK: extremities warm, well perfused. No edema.  NEURO: grossly nonfocal exam, moves all extremities. PSYCH: alert and oriented x 3, normal mood and affect.   ASSESSMENT:    1. Coronary artery calcification seen on CT scan   2. Chest pain, unspecified type    PLAN:    Coronary artery calcification seen on CT scan - Plan: EKG 12-Lead, MYOCARDIAL PERFUSION IMAGING  Chest pain, unspecified type - Plan: EKG 12-Lead, MYOCARDIAL PERFUSION IMAGING   He describes chest tightness and shortness of breath in the setting of CAC which appear dense in the LM and proximal LAD. We will need to exclude ischemia on stress testing given degree and location of calcifications. This has been discussed in great detail with the patient, images reviewed with patient, and we participated in shared decision making. Will pursue exercise stress myoview.   We discussed use of beta blockade but defer at this time given reactive airway disease not optimally controlled yet.   Cherlynn Kaiser, MD   [facility]    Medication Adjustments/Labs and Tests Ordered: Current medicines are reviewed at length with the patient today.  Concerns regarding medicines are outlined above.  Orders Placed This Encounter  Procedures  . MYOCARDIAL PERFUSION IMAGING  . EKG 12-Lead   No orders of the defined types were placed in this encounter.   Patient Instructions   Medication  Instructions:  NO CHANGES  *If you need a refill on your cardiac medications before your next appointment, please call your pharmacy*   Lab Work: NOT NEEDED  Testing/Procedure  WILL BE SCHEDULE AT [facility]   YOU WILL NEED TO HAVE COVID TEST 3 DAYS ( [place] )  PRIOR TO TEST,  QUARANTINE INT BETWEEN TIME  Your physician has requested that you have en exercise stress myoview. Please follow instruction sheet, as given.    Follow-Up: At [facility], you and your health needs are our priority.  As part of our continuing mission to provide you with exceptional heart care, we have created designated Provider Care Teams.  These Care Teams include your primary Cardiologist (physician) and Advanced Practice Providers (APPs -  Physician Assistants and Nurse Practitioners) who all work together to provide you with the care you need, when you need it.  We recommend signing up for the patient portal called "MyChart".  Sign up information is provided on this After Visit Summary.  MyChart is used to connect with patients for Virtual Visits (Telemedicine).  Patients are able to view lab/test results, encounter notes, upcoming appointments, etc.  Non-urgent messages can be sent to your provider as well.   To learn more about what you can do with MyChart, go to [email or web address].    Your next appointment:    3 TO 4 week(s)  The format for your next appointment:   Either In Person  or Virtual  Provider:   Cherlynn Kaiser, MD   Other Instructions N/A

## 2019-06-04 ENCOUNTER — Encounter: Payer: Self-pay | Admitting: Internal Medicine

## 2019-06-06 NOTE — Progress Notes (Signed)
[facility] Note  06/15/2019     CHIEF COMPLAINT Patient presents for Retina Follow Up   HISTORY OF PRESENT ILLNESS: Juan [name] is a 59 y.o. male who presents to the clinic today for:   HPI    Retina Follow Up    Patient presents with  Diabetic Retinopathy.  In both eyes.  This started 4 weeks ago.  Severity is moderate.  I, the attending physician,  performed the HPI with the patient and updated documentation appropriately.          Comments    Patient here for 4 weeks retina follow up for NPDR OU. Patient states vision about the same as last month. No eye pain. Found plaque around heart. Having checked.       Last edited by Bernarda Caffey, MD on 06/16/2019 10:30 PM. (History)    Patient states vision about the same OU.Found plaque around heart, which patient is having checked.   Referring physician: Harlan Stains, MD [place],  [place] [address]  HISTORICAL INFORMATION:   Selected notes from the MEDICAL RECORD NUMBER Referred by Dr. Rutherford Guys for concern of CME s/p cataract sx LEE:  Ocular Hx- PMH-   CURRENT MEDICATIONS: Current Outpatient Medications (Ophthalmic Drugs)  Medication Sig  . dorzolamide-timolol (COSOPT) 22.3-6.8 MG/ML ophthalmic solution Place 1 drop into the right eye 2 (two) times daily.  Marland Kitchen ketorolac (ACULAR) 0.5 % ophthalmic solution Place 1 drop into both eyes 4 (four) times daily.  Marland Kitchen ofloxacin (OCUFLOX) 0.3 % ophthalmic solution Place 1 drop into both eyes 3 (three) times daily.  . prednisoLONE acetate (PRED FORTE) 1 % ophthalmic suspension Place 1 drop into both eyes 4 (four) times daily.   No current facility-administered medications for this visit. (Ophthalmic Drugs)   Current Outpatient Medications (Other)  Medication Sig  . ADVAIR DISKUS 500-50 MCG/DOSE AEPB Inhale 1 puff into the lungs 2 (two) times daily.  Marland Kitchen albuterol (PROVENTIL HFA;VENTOLIN HFA) 108 (90 BASE) MCG/ACT inhaler  Inhale 1-2 puffs into the lungs every 6 (six) hours as needed for wheezing or shortness of breath.   Marland Kitchen albuterol (PROVENTIL) (2.5 MG/3ML) 0.083% nebulizer solution INHALE CONTENTS OF ONE VIAL IN NEBULIZER THREE TIMES DAILY AS NEEDED  . alfuzosin (UROXATRAL) 10 MG 24 hr tablet TAKE 1 TABLET BY MOUTH EVERYDAY AT BEDTIME  . aspirin EC 81 MG tablet Take 81 mg by mouth daily.  Marland Kitchen atorvastatin (LIPITOR) 40 MG tablet Take 40 mg by mouth daily.  Marland Kitchen doxycycline (VIBRA-TABS) 100 MG tablet Take 1 tablet (100 mg total) by mouth 2 (two) times daily.  . Empagliflozin-metFORMIN HCl (SYNJARDY) 12.06-998 MG TABS Take by mouth 2 (two) times a day.  . esomeprazole (NEXIUM) 40 MG capsule Take 40 mg by mouth daily at 12 noon.  Marland Kitchen glimepiride (AMARYL) 4 MG tablet Take 4 mg by mouth 2 (two) times daily.  Marland Kitchen HYDROMET 5-1.5 MG/5ML syrup TAKE ONE TEASPOONFUL BY MOUTH EVERY 4 TO 6 HOURS AS NEEDED FOR COUGH  . insulin glargine, 2 Unit Dial, (TOUJEO MAX SOLOSTAR) 300 UNIT/ML Solostar Pen Inject into the skin.  Marland Kitchen KOMBIGLYZE XR 2.06-998 MG TB24 Take 2 tablets by mouth at bedtime.   Marland Kitchen LANTUS SOLOSTAR 100 UNIT/ML Solostar Pen Inject 42 Units into the skin at bedtime.   Marland Kitchen lisinopril (ZESTRIL) 40 MG tablet Take 40 mg by mouth daily.  Marland Kitchen lisinopril-hydrochlorothiazide (PRINZIDE,ZESTORETIC) 20-25 MG per tablet Take 1 tablet by mouth daily.  Marland Kitchen  Multiple Vitamins-Minerals (CENTRUM SILVER 50+MEN PO) as directed.  . nitrofurantoin, macrocrystal-monohydrate, (MACROBID) 100 MG capsule Take 100 mg by mouth 2 (two) times daily.   No current facility-administered medications for this visit. (Other)      REVIEW OF SYSTEMS: ROS    Positive for: Endocrine, Cardiovascular, Eyes   Negative for: Constitutional, Gastrointestinal, Neurological, Skin, Genitourinary, Musculoskeletal, HENT, Respiratory, Psychiatric, Allergic/Imm, Heme/Lymph   Last edited by Theodore Demark, COA on 06/15/2019  2:19 PM. (History)       ALLERGIES Allergies   Allergen Reactions  . Naproxen Shortness Of Breath  . Naproxen Sodium Shortness Of Breath  . Penicillins Rash    Has patient had a PCN reaction causing immediate rash, facial/tongue/throat swelling, SOB or lightheadedness with hypotension: Yes Has patient had a PCN reaction causing severe rash involving mucus membranes or skin necrosis: No Has patient had a PCN reaction that required hospitalization No Has patient had a PCN reaction occurring within the last 10 years: No If all of the above answers are "NO", then may proceed with Cephalosporin use.  Has patient had a PCN reaction causing immediate rash, facial/tongue/throat swelling, SOB or lightheadedness with hypotension: Yes Has patient had a PCN reaction causing severe rash involving mucus membranes or skin necrosis: No Has patient had a PCN reaction that required hospitalization No Has patient had a PCN reaction occurring within the last 10 years: No If all of the above answers are "NO", then may proceed with Cephalosporin use.     PAST MEDICAL HISTORY Past Medical History:  Diagnosis Date  . Arthritis   . Asthma   . COPD (chronic obstructive pulmonary disease) ([place])   . Diabetes mellitus    Type 2  . Diabetic retinopathy ([place])    NPDR OU  . GERD (gastroesophageal reflux disease)   . H/O hiatal hernia   . History of kidney stones   . Hyperlipemia   . Hypertension   . Hypertensive retinopathy    OU  . Nasal polyps   . Pneumonia 2014  . Shortness of breath   . Sleep apnea    pt has CPAP but is unable to use it d/t having polyps in his nose. Pt stated "I need to call and get a CPAP mask instead"  . Stomach ulcer    Past Surgical History:  Procedure Laterality Date  . CATARACT EXTRACTION Right 07/05/2018   Dr. Gershon Crane  . CATARACT EXTRACTION Left 07/12/2018   Dr. Gershon Crane  . CHOLECYSTECTOMY N/A 01/23/2015   Procedure: LAPAROSCOPIC CHOLECYSTECTOMY ;  Surgeon: Georganna Skeans, MD;  Location: [facility];  Service: General;   Laterality: N/A;  . COLONOSCOPY W/ POLYPECTOMY    . ESOPHAGOGASTRODUODENOSCOPY    . EYE SURGERY    . KNEE ARTHROSCOPY  03/24/2011   Procedure: ARTHROSCOPY KNEE;  Surgeon: Alta Corning, MD;  Location: [facility];  Service: Orthopedics;  Laterality: Right;  partial medial menisectomy, chondroplaty patella, and medial medial plica    FAMILY HISTORY Family History  Problem Relation Age of Onset  . Diabetes Father     SOCIAL HISTORY Social History   Tobacco Use  . Smoking status: Former Smoker    Years: 20.00    Types: Cigarettes  . Smokeless tobacco: Current User    Types: Chew  . Tobacco comment: 1 can/daily  Substance Use Topics  . Alcohol use: Yes    Comment: social, 1x weekly  . Drug use: No         OPHTHALMIC EXAM:  Base Eye Exam    Visual Acuity (Snellen - Linear)      Right Left   Dist [place] 20/30 +2 20/70 -2   Dist ph [place] 20/20 -1 NI       Tonometry (Tonopen, 2:16 PM)      Right Left   Pressure 16 18       Pupils      Dark Light Shape React APD   Right 3 2 Round Brisk None   Left 3 2 Round Brisk None       Visual Fields (Counting fingers)      Left Right    Full Full       Extraocular Movement      Right Left    Full, Ortho Full, Ortho       Neuro/Psych    Oriented x3: Yes   Mood/Affect: Normal       Dilation    Both eyes: 1.0% Mydriacyl, 2.5% Phenylephrine @ 2:16 PM        Slit Lamp and Fundus Exam    Slit Lamp Exam      Right Left   Lids/Lashes Dermatochalasis - upper lid, Telangiectasia, mild Meibomian gland dysfunction Dermatochalasis - upper lid, Telangiectasia, mild Meibomian gland dysfunction   Conjunctiva/Sclera White and quiet, +concretions on palpebral conj Mild nasal Pinguecula, +concretions on palpebral conj   Cornea Mild Arcus, Well healed temporal cataract wounds Arcus, 1+ Descemet's folds, Well healed temporal cataract wounds   Anterior Chamber deep, 1+ cell/pigment deep, 1+ cell/pigment   Iris Round and  dilated, mild patch of atrophy at 0800, no NVI Round and dilated, No NVI   Lens PC IOL in good position PC IOL in good position   Vitreous Vitreous syneresis Vitreous syneresis       Fundus Exam      Right Left   Disc Pink and Sharp Pallor, Sharp rim   C/D Ratio 0.4 0.3   Macula Blunted foveal reflex, +cystic edema / IRF -- persistent, but slightly improved, scattered MA and exudate temporal mac Blunted foveal reflex, +central edema -- improving, scattered DBH and MAs, scattered puncatate exudates greatest superiorly - improving   Vessels Vascular attenuation, Tortuous Vascular attenuation, Tortuous   Periphery Attached, scattered MA Attached, pigmented choroidal nevus superiorly with mild elevation, +drusen, no SRF          IMAGING AND PROCEDURES  Imaging and Procedures for _0 @  SARS CORONAVIRUS 2 (TAT 6-24 HRS) Nasopharyngeal Nasopharyngeal Swab     Specimen Information: Nasopharyngeal Swab        Component Value Flag Ref Range Units Status   SARS Coronavirus 2 NEGATIVE      NEGATIVE  Final   Comment:   (NOTE) SARS-CoV-2 target nucleic acids are NOT DETECTED. The SARS-CoV-2 RNA is generally detectable in upper and lower respiratory specimens during the acute phase of infection. Negative results do not preclude SARS-CoV-2 infection, do not rule out co-infections with other pathogens, and should not be used as the sole basis for treatment or other patient management decisions. Negative results must be combined with clinical observations, patient history, and epidemiological information. The expected result is Negative. Fact Sheet for Patients: SugarRoll.be Fact Sheet for Healthcare Providers: https://www.woods-mathews.com/ This test is not yet approved or cleared by the [place] FDA and  has been authorized for detection and/or diagnosis of SARS-CoV-2 by FDA under an Emergency Use Authorization (EUA). This EUA will remain  in  effect (meaning this test can be used) for the duration  of the COVID-19 declaration under Section 564(b)(1) of the Act, 21 U.S.C. section 360bbb-3(b)(1), unless the authorization is terminated or revoked sooner. Performed at [facility], [place] [address], [place],  [address]         OCT, Retina - OU - Both Eyes       Right Eye Quality was good. Central Foveal Thickness: 326. Progression has improved. Findings include abnormal foveal contour, intraretinal hyper-reflective material, intraretinal fluid, outer retinal atrophy, no SRF (? Mild interval improvement in IRF).   Left Eye Quality was good. Central Foveal Thickness: 416. Progression has improved. Findings include intraretinal fluid, intraretinal hyper-reflective material, abnormal foveal contour, no SRF (Persistent IRF/IRHM -- mild improvement from prior, Sub-RPE hyper-reflective mass consistent with choroidal nevus--superior to disc, caught on widefield -- stable from prior).   Notes *Images captured and stored on drive  Diagnosis / Impression: Severe DME OU OD: ? mild interval improvement in IRF OS: Persistent IRF/IRHM -- mild improvement from prior, Sub-RPE hyper-reflective mass consistent with choroidal nevus--superior to disc, caught on widefield -- stable from prior   Clinical management:  See below  Abbreviations: NFP - Normal foveal profile. CME - cystoid macular edema. PED - pigment epithelial detachment. IRF - intraretinal fluid. SRF - subretinal fluid. EZ - ellipsoid zone. ERM - epiretinal membrane. ORA - outer retinal atrophy. ORT - outer retinal tubulation. SRHM - subretinal hyper-reflective material        Intravitreal Injection, Pharmacologic Agent - OD - Right Eye       Time Out 06/15/2019. 2:23 PM. Confirmed correct patient, procedure, site, and patient consented.   Anesthesia Topical anesthesia was used. Anesthetic medications included Lidocaine 2%, Proparacaine 0.5%.    Procedure Preparation included 5% betadine to ocular surface, eyelid speculum. A (33g) needle was used.   Injection:  2 mg aflibercept Alfonse Flavors) SOLN   NDC: A3590391, Lot: [id number], Expiration date: 10/29/2019   Route: Intravitreal, Site: Right Eye, Waste: 0.05 mL  Post-op Post injection exam found visual acuity of at least counting fingers. The patient tolerated the procedure well. There were no complications. The patient received written and verbal post procedure care education.        Intravitreal Injection, Pharmacologic Agent - OS - Left Eye       Time Out 06/15/2019. 2:24 PM. Confirmed correct patient, procedure, site, and patient consented.   Anesthesia Topical anesthesia was used. Anesthetic medications included Lidocaine 2%, Proparacaine 0.5%.   Procedure Preparation included 5% betadine to ocular surface, eyelid speculum. A (33g) needle was used.   Injection:  2 mg aflibercept Alfonse Flavors) SOLN   NDC: A3590391, Lot: [id number], Expiration date: 10/29/2019   Route: Intravitreal, Site: Left Eye, Waste: 0.05 mL  Post-op Post injection exam found visual acuity of at least counting fingers. The patient tolerated the procedure well. There were no complications. The patient received written and verbal post procedure care education.                 ASSESSMENT/PLAN:    ICD-10-CM   1. Severe nonproliferative diabetic retinopathy of both eyes with macular edema associated with type 2 diabetes mellitus (HCC)  X32.3557 Intravitreal Injection, Pharmacologic Agent - OD - Right Eye    Intravitreal Injection, Pharmacologic Agent - OS - Left Eye    aflibercept (EYLEA) SOLN 2 mg    aflibercept (EYLEA) SOLN 2 mg  2. 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  Essential hypertension  I10   5. Hypertensive retinopathy of both eyes  H35.033   6. Pseudophakia of both eyes  Z96.1   7. Ocular hypertension of right eye   H40.051     1. Severe nonproliferative diabetic retinopathy with DME, OU (OS>OD)  - s/p IVA OD #1 (06.26.20), #2 (08.14.20), #3 (09.17.20), #4 (10.16.20)  - s/p IVA OS #1 (07.17.20), #2 (08.14.20), #3 (09.17.20)  - s/p IVA OU (08.14.20), #3 (09.17.20)  - s/p IVE OS #1 (10.16.20) - sample, #2 (11.13.20), #3 (12.11.20), #4 (01.18.21), #5 (02.19.21), #6 (03.19.21), #7 (04.16.21)  - s/p IVE OD #1 (11.13.20), #2 (12.11.20), #3 (01.18.21), #4 (02.19.21), #5 (03.19.21), #6 (04.16.21)  - FA (06.26.20) shows Severe NPDR OU w/ Late leaking MA OU, Enlarged FAZ OU, No NV OU, No significant hyperfluorescence of disc  - BCVA: OD 20/20 - stable; OS stable at 20/70  - OCT shows ?mild interval improvement in IRF OD; mild improvement in  IRF/IRHM OS  - recommend IVE OD #7 and IVE #8 OS today, 05.14.21  - pt wishes to proceed  - RBA of procedure discussed, questions answered  - informed consent obtained  - Eylea informed consent form signed and scanned on 01.18.2021  - see procedure note  - Eylea4U Benefits Investigation initiated 10.16.2020 -- approved for 2021  - f/u in 4 weeks -- DFE/OCT/possible injection OU  2. Retinal Edema OU  - based on FA, suspect majority of macular edema due to DM as above, but there may be some edema secondary to post-op CME  - cont PF qid OU and ketorolac qid OU   - history of difficulty with compliance at work  - monitor  - f/u 4 wks  3. Choroidal Nevus OS  - located at 1200, mild elevation, +drusen, no SRF  - baseline optos pictures obtained, 06.26.20  - discussed possible referral to ocular oncologist at [facility] for evaluation/management  4,5.Hypertensive retinopathy OU  - discussed importance of tight BP control  - monitor  6. Pseudophakia OU  - s/p CE/IOL OU (OD on 06.03.20 and OS 06.10.20 by Dr. Gershon Crane)  - beautiful surgeries w/ IOLs in excellent position  - macular edema limiting vision as above  - monitor  7. Ocular hypertension OD  - IOP 16  OD  - cont Cosopt BID OD  - monitor   Ophthalmic Meds Ordered this visit:  Meds ordered this encounter  Medications  . aflibercept (EYLEA) SOLN 2 mg  . aflibercept (EYLEA) SOLN 2 mg      Return in about 4 weeks (around 07/13/2019) for DFE, OCT.  There are no Patient Instructions on file for this visit.   Explained the diagnoses, plan, and follow up with the patient and they expressed understanding.  Patient expressed understanding of the importance of proper follow up care.   This document serves as a record of services personally performed by Gardiner Sleeper, MD, PhD. It was created on their behalf by Roselee Nova, COMT. The creation of this record is the provider's dictation and/or activities during the visit.  Electronically signed by: Roselee Nova, COMT 06/16/19 10:34 PM  Gardiner Sleeper, M.D., Ph.D. Diseases & Surgery of the Retina and Vitreous [facility]  I have reviewed the above documentation for accuracy and completeness, and I agree with the above. Gardiner Sleeper, M.D., Ph.D. 06/16/19 10:34 PM   Abbreviations: M myopia (nearsighted); A astigmatism; H hyperopia (farsighted); P presbyopia; Mrx spectacle prescription;  CTL  contact lenses; OD right eye; OS left eye; OU both eyes  XT exotropia; ET esotropia; PEK punctate epithelial keratitis; PEE punctate epithelial erosions; DES dry eye syndrome; MGD meibomian gland dysfunction; ATs artificial tears; PFAT's preservative free artificial tears; Rel[place]ago nuclear sclerotic cataract; PSC posterior subcapsular cataract; ERM epi-retinal membrane; PVD posterior vitreous detachment; RD retinal detachment; DM diabetes mellitus; DR diabetic retinopathy; NPDR non-proliferative diabetic retinopathy; PDR proliferative diabetic retinopathy; CSME clinically significant macular edema; DME diabetic macular edema; dbh dot blot hemorrhages; CWS cotton wool spot; POAG primary open angle glaucoma; C/D cup-to-disc ratio; HVF  humphrey visual field; GVF goldmann visual field; OCT optical coherence tomography; IOP intraocular pressure; BRVO Branch retinal vein occlusion; CRVO central retinal vein occlusion; CRAO central retinal artery occlusion; BRAO branch retinal artery occlusion; RT retinal tear; SB scleral buckle; PPV pars plana vitrectomy; VH Vitreous hemorrhage; PRP panretinal laser photocoagulation; IVK intravitreal kenalog; VMT vitreomacular traction; MH Macular hole;  NVD neovascularization of the disc; NVE neovascularization elsewhere; AREDS age related eye disease study; ARMD age related macular degeneration; POAG primary open angle glaucoma; EBMD epithelial/anterior basement membrane dystrophy; ACIOL anterior chamber intraocular lens; IOL intraocular lens; PCIOL posterior chamber intraocular lens; Phaco/IOL phacoemulsification with intraocular lens placement; [place] photorefractive keratectomy; LASIK laser assisted in situ keratomileusis; HTN hypertension; DM diabetes mellitus; COPD chronic obstructive pulmonary disease

## 2019-06-14 ENCOUNTER — Telehealth (HOSPITAL_COMMUNITY): Payer: Self-pay

## 2019-06-14 NOTE — Telephone Encounter (Signed)
Encounter complete. 

## 2019-06-15 ENCOUNTER — Encounter (INDEPENDENT_AMBULATORY_CARE_PROVIDER_SITE_OTHER): Payer: Self-pay | Admitting: Ophthalmology

## 2019-06-15 ENCOUNTER — Other Ambulatory Visit (HOSPITAL_COMMUNITY)
Admission: RE | Admit: 2019-06-15 | Discharge: 2019-06-15 | Disposition: A | Discharge status: Home / Self Care | Payer: BC Managed Care – PPO | Source: Ambulatory Visit | Attending: Internal Medicine | Admitting: Internal Medicine

## 2019-06-15 ENCOUNTER — Ambulatory Visit (INDEPENDENT_AMBULATORY_CARE_PROVIDER_SITE_OTHER): Payer: BC Managed Care – PPO | Admitting: Ophthalmology

## 2019-06-15 ENCOUNTER — Other Ambulatory Visit: Payer: Self-pay

## 2019-06-15 ENCOUNTER — Other Ambulatory Visit (HOSPITAL_COMMUNITY): Payer: BC Managed Care – PPO

## 2019-06-15 DIAGNOSIS — Z20822 Contact with and (suspected) exposure to covid-19: Secondary | ICD-10-CM | POA: Insufficient documentation

## 2019-06-15 DIAGNOSIS — D3132 Benign neoplasm of left choroid: Secondary | ICD-10-CM | POA: Diagnosis not present

## 2019-06-15 DIAGNOSIS — Z961 Presence of intraocular lens: Secondary | ICD-10-CM

## 2019-06-15 DIAGNOSIS — H3581 Retinal edema: Secondary | ICD-10-CM

## 2019-06-15 DIAGNOSIS — Z01812 Encounter for preprocedural laboratory examination: Secondary | ICD-10-CM | POA: Diagnosis not present

## 2019-06-15 DIAGNOSIS — H40051 Ocular hypertension, right eye: Secondary | ICD-10-CM

## 2019-06-15 DIAGNOSIS — I10 Essential (primary) hypertension: Secondary | ICD-10-CM

## 2019-06-15 DIAGNOSIS — E113413 Type 2 diabetes mellitus with severe nonproliferative diabetic retinopathy with macular edema, bilateral: Secondary | ICD-10-CM

## 2019-06-15 DIAGNOSIS — H35033 Hypertensive retinopathy, bilateral: Secondary | ICD-10-CM

## 2019-06-15 LAB — SARS CORONAVIRUS 2 (TAT 6-24 HRS): SARS Coronavirus 2: NEGATIVE

## 2019-06-16 ENCOUNTER — Encounter (INDEPENDENT_AMBULATORY_CARE_PROVIDER_SITE_OTHER): Payer: Self-pay | Admitting: Ophthalmology

## 2019-06-16 MED ORDER — AFLIBERCEPT 2MG/0.05ML IZ SOLN FOR KALEIDOSCOPE
2.0000 mg | INTRAVITREAL | Status: AC | PRN
  Administered 2019-06-16: 2 mg via INTRAVITREAL

## 2019-06-19 ENCOUNTER — Ambulatory Visit (HOSPITAL_COMMUNITY)
Admission: RE | Admit: 2019-06-19 | Discharge: 2019-06-19 | Disposition: A | Discharge status: Home / Self Care | Payer: BC Managed Care – PPO | Source: Ambulatory Visit | Attending: Cardiology | Admitting: Cardiology

## 2019-06-19 ENCOUNTER — Other Ambulatory Visit: Payer: Self-pay

## 2019-06-19 DIAGNOSIS — R079 Chest pain, unspecified: Secondary | ICD-10-CM | POA: Insufficient documentation

## 2019-06-19 DIAGNOSIS — I2510 Atherosclerotic heart disease of native coronary artery without angina pectoris: Secondary | ICD-10-CM | POA: Insufficient documentation

## 2019-06-19 LAB — MYOCARDIAL PERFUSION IMAGING
Estimated workload: 7 METS
Exercise duration (min): 6 min
Exercise duration (sec): 0 s
LV dias vol: 117 mL (ref 62–150)
LV sys vol: 54 mL
MPHR: 162 {beats}/min
Peak HR: 155 {beats}/min
Percent HR: 95 %
RPE: 18
Rest HR: 91 {beats}/min
SDS: 2
SRS: 1
SSS: 3
TID: 1.14

## 2019-06-19 MED ORDER — TECHNETIUM TC 99M TETROFOSMIN IV KIT
10.5000 | PACK | Freq: Once | INTRAVENOUS | Status: AC | PRN
  Administered 2019-06-19: 10.5 via INTRAVENOUS
  Filled 2019-06-19: qty 11

## 2019-06-19 MED ORDER — TECHNETIUM TC 99M TETROFOSMIN IV KIT
29.0000 | PACK | Freq: Once | INTRAVENOUS | Status: AC | PRN
  Administered 2019-06-19: 29 via INTRAVENOUS
  Filled 2019-06-19: qty 29

## 2019-06-21 ENCOUNTER — Encounter: Payer: Self-pay | Admitting: Internal Medicine

## 2019-06-21 ENCOUNTER — Telehealth (INDEPENDENT_AMBULATORY_CARE_PROVIDER_SITE_OTHER): Payer: BC Managed Care – PPO | Admitting: Internal Medicine

## 2019-06-21 ENCOUNTER — Telehealth: Payer: Self-pay | Admitting: *Deleted

## 2019-06-21 ENCOUNTER — Other Ambulatory Visit: Payer: Self-pay | Admitting: *Deleted

## 2019-06-21 VITALS — Ht 69.0 in | Wt 230.0 lb

## 2019-06-21 DIAGNOSIS — R079 Chest pain, unspecified: Secondary | ICD-10-CM | POA: Diagnosis not present

## 2019-06-21 DIAGNOSIS — I10 Essential (primary) hypertension: Secondary | ICD-10-CM | POA: Diagnosis not present

## 2019-06-21 DIAGNOSIS — G4733 Obstructive sleep apnea (adult) (pediatric): Secondary | ICD-10-CM

## 2019-06-21 DIAGNOSIS — Z01818 Encounter for other preprocedural examination: Secondary | ICD-10-CM | POA: Diagnosis not present

## 2019-06-21 DIAGNOSIS — E785 Hyperlipidemia, unspecified: Secondary | ICD-10-CM

## 2019-06-21 DIAGNOSIS — R9439 Abnormal result of other cardiovascular function study: Secondary | ICD-10-CM

## 2019-06-21 MED ORDER — SODIUM CHLORIDE 0.9% FLUSH: 3.0000 mL | Freq: Two times a day (BID) | INTRAVENOUS | Status: DC

## 2019-06-21 NOTE — Patient Instructions (Addendum)
Medication Instructions:  No changes *If you need a refill on your cardiac medications before your next appointment, please call your pharmacy*  Testing/Procedures: Your physician has requested that you have a cardiac catheterization. Cardiac catheterization is used to diagnose and/or treat various heart conditions. Doctors may recommend this procedure for a number of different reasons. The most common reason is to evaluate chest pain. Chest pain can be a symptom of coronary artery disease (CAD), and cardiac catheterization can show whether plaque is narrowing or blocking your heart's arteries. This procedure is also used to evaluate the valves, as well as measure the blood flow and oxygen levels in different parts of your heart. For further information please visit HugeFiesta.tn. Please follow instruction sheet, as given.   Follow-Up: At [facility], you and your health needs are our priority.  As part of our continuing mission to provide you with exceptional heart care, we have created designated Provider Care Teams.  These Care Teams include your primary Cardiologist (physician) and Advanced Practice Providers (APPs -  Physician Assistants and Nurse Practitioners) who all work together to provide you with the care you need, when you need it.  We recommend signing up for the patient portal called "MyChart".  Sign up information is provided on this After Visit Summary.  MyChart is used to connect with patients for Virtual Visits (Telemedicine).  Patients are able to view lab/test results, encounter notes, upcoming appointments, etc.  Non-urgent messages can be sent to your provider as well.   To learn more about what you can do with MyChart, go to [email or web address].    Your next appointment:   Follow up with Dr. Margaretann Loveless in 7-10 days after the heart catheterization. We will call you to set this up.  Other Instructions    Juan [name] [place] [address] Dept: [phone] Loc: [place]  06/21/2019  You are scheduled for a Cardiac Catheterization on Friday, May 28 with Dr. Kathlyn Sacramento.  1. Please arrive at the [facility] (Main Entrance A) at [facility]: [address] [place], [place] [address] at 5:30 AM (This time is two hours before your procedure to ensure your preparation). Free valet parking service is available.   Special note: Every effort is made to have your procedure done on time. Please understand that emergencies sometimes delay scheduled procedures.  2. Diet: Do not eat solid foods after midnight.  The patient may have clear liquids until 5am upon the day of the procedure.  3. Labs:  Your provider would like for you to return  06/26/19 to have the following labs drawn: CBC and BMET. You do not need an appointment for the lab. Once in our office lobby there is a podium where you can sign in and ring the doorbell to alert [place] that you are here. The lab is open from 8:00 am to 4:30 pm; closed for lunch from 12:45pm-1:45pm.  You will need to have the coronavirus test completed prior to your procedure. An appointment has been made at 9:20 am on 06/26/19. This is a Drive Up Visit at the [employer] [address]. Someone will direct you to the appropriate testing line. Please tell them that you are there for procedure testing. Stay in your car and someone will be with you shortly. Please make sure to have all other labs completed before this test because you will need to  stay quarantined until your procedure.  4. Medication instructions in preparation for your procedure: Hold all diabetic medication the morning of the catheterization. Hold Synjardy the morning of the procedure and 48 hours after since this does contain Metformin. TAKE half the dose of the Insulin Glargine the night before the procedure.   Hold the Lisinopril-Hydrochlorothiazide the morning of the procedure.   On the morning of your procedure, take your Aspirin and any morning medicines NOT listed above.  You may use sips of water.  5. Plan for one night stay--bring personal belongings. 6. Bring a current list of your medications and current insurance cards. 7. You MUST have a responsible person to drive you home. 8. Someone MUST be with you the first 24 hours after you arrive home or your discharge will be delayed. 9. Please wear clothes that are easy to get on and off and wear slip-on shoes.  Thank you for allowing [place] to care for you!   -- [facility]

## 2019-06-21 NOTE — Progress Notes (Signed)
Virtual Visit via Telephone Note   This visit type was conducted due to national recommendations for restrictions regarding the COVID-19 Pandemic (e.g. social distancing) in an effort to limit this patient's exposure and mitigate transmission in our community.  Due to his co-morbid illnesses, this patient is at least at moderate risk for complications without adequate follow up.  This format is felt to be most appropriate for this patient at this time.  The patient did not have access to video technology/had technical difficulties with video requiring transitioning to audio format only (telephone).  All issues noted in this document were discussed and addressed.  No physical exam could be performed with this format.  Please refer to the patient's chart for his  consent to telehealth for [facility].   The patient was identified using 2 identifiers.  Date:  06/21/2019   ID:  Juan [name], DOB 1960-07-12, MRN [id number]  Patient Location: Home Provider Location: Home Office  PCP:  Harlan Stains, MD  Cardiologist:  No primary care provider on file.  Electrophysiologist:  None   Evaluation Performed:  Follow-Up Visit  Chief Complaint:  SOB, abnormal stress test with CAC  History of Present Illness:    Juan [name] is a 59 y.o. male with DOE and chest tightness with incidentally noted CAC on chest CT, with dense calcium in the LM and proximal LAD who presents after stress testing.   Stress study shows apical ischemia, however study was not performed on D-Spect camera, there fore cannot exclude possibility of apical attenuation artifact due to body habitus. Test stopped per report due to angina, and patient notes this occurred at the very end of exercise. Findings overall were low risk to low-intermediate risk.   Discussed this in detail with patient, that stress study suggestive of ischemia but in a small, distal area. We participated in shared decision making.   The patient  does not have symptoms concerning for COVID-19 infection (fever, chills, cough, or new shortness of breath).    Past Medical History:  Diagnosis Date  . Arthritis   . Asthma   . COPD (chronic obstructive pulmonary disease) ([place])   . Diabetes mellitus    Type 2  . Diabetic retinopathy ([place])    NPDR OU  . GERD (gastroesophageal reflux disease)   . H/O hiatal hernia   . History of kidney stones   . Hyperlipemia   . Hypertension   . Hypertensive retinopathy    OU  . Nasal polyps   . Pneumonia 2014  . Shortness of breath   . Sleep apnea    pt has CPAP but is unable to use it d/t having polyps in his nose. Pt stated "I need to call and get a CPAP mask instead"  . Stomach ulcer    Past Surgical History:  Procedure Laterality Date  . CATARACT EXTRACTION Right 07/05/2018   Dr. Gershon Crane  . CATARACT EXTRACTION Left 07/12/2018   Dr. Gershon Crane  . CHOLECYSTECTOMY N/A 01/23/2015   Procedure: LAPAROSCOPIC CHOLECYSTECTOMY ;  Surgeon: Georganna Skeans, MD;  Location: [place];  Service: General;  Laterality: N/A;  . COLONOSCOPY W/ POLYPECTOMY    . ESOPHAGOGASTRODUODENOSCOPY    . EYE SURGERY    . KNEE ARTHROSCOPY  03/24/2011   Procedure: ARTHROSCOPY KNEE;  Surgeon: Alta Corning, MD;  Location: [place];  Service: Orthopedics;  Laterality: Right;  partial medial menisectomy, chondroplaty patella, and medial medial plica     Current Meds  Medication Sig  .  ADVAIR DISKUS 500-50 MCG/DOSE AEPB Inhale 1 puff into the lungs 2 (two) times daily.  Marland Kitchen albuterol (PROVENTIL HFA;VENTOLIN HFA) 108 (90 BASE) MCG/ACT inhaler Inhale 1-2 puffs into the lungs every 6 (six) hours as needed for wheezing or shortness of breath.   Marland Kitchen albuterol (PROVENTIL) (2.5 MG/3ML) 0.083% nebulizer solution INHALE CONTENTS OF ONE VIAL IN NEBULIZER THREE TIMES DAILY AS NEEDED  . alfuzosin (UROXATRAL) 10 MG 24 hr tablet TAKE 1 TABLET BY MOUTH EVERYDAY AT BEDTIME  . aspirin EC 81 MG tablet Take 81 mg by mouth daily.  Marland Kitchen  atorvastatin (LIPITOR) 40 MG tablet Take 40 mg by mouth daily.  . dorzolamide-timolol (COSOPT) 22.3-6.8 MG/ML ophthalmic solution Place 1 drop into the right eye 2 (two) times daily.  Marland Kitchen doxycycline (VIBRA-TABS) 100 MG tablet Take 1 tablet (100 mg total) by mouth 2 (two) times daily.  . Empagliflozin-metFORMIN HCl (SYNJARDY) 12.06-998 MG TABS Take by mouth 2 (two) times a day.  . esomeprazole (NEXIUM) 40 MG capsule Take 40 mg by mouth daily at 12 noon.  Marland Kitchen glimepiride (AMARYL) 4 MG tablet Take 4 mg by mouth 2 (two) times daily.  Marland Kitchen HYDROMET 5-1.5 MG/5ML syrup TAKE ONE TEASPOONFUL BY MOUTH EVERY 4 TO 6 HOURS AS NEEDED FOR COUGH  . insulin glargine, 2 Unit Dial, (TOUJEO MAX SOLOSTAR) 300 UNIT/ML Solostar Pen Inject into the skin.  Marland Kitchen ketorolac (ACULAR) 0.5 % ophthalmic solution Place 1 drop into both eyes 4 (four) times daily.  Marland Kitchen KOMBIGLYZE XR 2.06-998 MG TB24 Take 2 tablets by mouth at bedtime.   Marland Kitchen lisinopril-hydrochlorothiazide (PRINZIDE,ZESTORETIC) 20-25 MG per tablet Take 1 tablet by mouth daily.  . Multiple Vitamins-Minerals (CENTRUM SILVER 50+MEN PO) as directed.  Marland Kitchen ofloxacin (OCUFLOX) 0.3 % ophthalmic solution Place 1 drop into both eyes 3 (three) times daily.  . prednisoLONE acetate (PRED FORTE) 1 % ophthalmic suspension Place 1 drop into both eyes 4 (four) times daily.     Allergies:   Naproxen, Naproxen sodium, and Penicillins   Social History   Tobacco Use  . Smoking status: Former Smoker    Years: 20.00    Types: Cigarettes  . Smokeless tobacco: Current User    Types: Chew  . Tobacco comment: 1 can/daily  Substance Use Topics  . Alcohol use: Yes    Comment: social, 1x weekly  . Drug use: No     Family Hx: The patient's family history includes Diabetes in his father.  ROS:   Please see the history of present illness.     All other systems reviewed and are negative.   Prior CV studies:   The following studies were reviewed today:    Labs/Other Tests and Data  Reviewed:    EKG:  No ECG reviewed.  Recent Labs: 04/11/2019: BUN 15; Creatinine, Ser 1.17; Hemoglobin 9.8; Platelets 118; Potassium 3.8; Sodium 141   Recent Lipid Panel Lab Results  Component Value Date/Time   CHOL  03/05/2010 03:10 AM    90        ATP III CLASSIFICATION:  <200     mg/dL   Desirable  200-239  mg/dL   Borderline High  >=240    mg/dL   High          TRIG 66 03/05/2010 03:10 AM   HDL 32 (L) 03/05/2010 03:10 AM   CHOLHDL 2.8 03/05/2010 03:10 AM   LDLCALC  03/05/2010 03:10 AM    45        Total Cholesterol/HDL:CHD Risk Coronary Heart Disease Risk  Table                     Men   Women  1/2 Average Risk   3.4   3.3  Average Risk       5.0   4.4  2 X Average Risk   9.6   7.1  3 X Average Risk  23.4   11.0        Use the calculated Patient Ratio above and the CHD Risk Table to determine the patient's CHD Risk.        ATP III CLASSIFICATION (LDL):  <100     mg/dL   Optimal  100-129  mg/dL   Near or Above                    Optimal  130-159  mg/dL   Borderline  160-189  mg/dL   High  >190     mg/dL   Very High    Wt Readings from Last 3 Encounters:  06/21/19 230 lb (104.3 kg)  06/19/19 227 lb (103 kg)  06/01/19 227 lb (103 kg)     Objective:    Vital Signs:  Ht 5\' 9"  (1.753 m)   Wt 230 lb (104.3 kg)   BMI 33.97 kg/m    VITAL SIGNS:  reviewed GEN:  no acute distress RESPIRATORY:  normal respiratory effort, no increased work of breathing NEURO:  alert and oriented x 3, speech normal PSYCH:  normal affect   ASSESSMENT & PLAN:    Chest pain, unspecified type  Abnormal cardiovascular stress test - discussed in detail the management of CAC and abnormal stress test. Shared decision making performed. Patient would like to be proactive and purse coronary angiography, in setting of abnormal stress test and exertional chest tightness, it is reasonable to define coronary anatomy and assess for high grade stenosis with LHC. Will plan this for next  available.  INFORMED CONSENT: I have reviewed the risks, indications, and alternatives to cardiac catheterization, possible angioplasty, and stenting with the patient. Risks include but are not limited to bleeding, infection, vascular injury, stroke, myocardial infection, arrhythmia, kidney injury, radiation-related injury in the case of prolonged fluoroscopy use, emergency cardiac surgery, and death. The patient understands the risks of serious complication is 1-2 in 123XX123 with diagnostic cardiac cath and 1-2% or less with angioplasty/stenting.   Pre-op testing - Plan: CBC, Basic metabolic panel  Essential hypertension - continue lisinopril  Hyperlipidemia, unspecified hyperlipidemia type - continue atorvastatin.   OSA (obstructive sleep apnea) - continue CPAP.    COVID-19 Education: The signs and symptoms of COVID-19 were discussed with the patient and how to seek care for testing (follow up with PCP or arrange E-visit).  The importance of social distancing was discussed today.  Time:   Today, I have spent 25 minutes with the patient with telehealth technology discussing the above problems.     Medication Adjustments/Labs and Tests Ordered: Current medicines are reviewed at length with the patient today.  Concerns regarding medicines are outlined above.   Tests Ordered: Orders Placed This Encounter  Procedures  . CBC  . Basic metabolic panel    Medication Changes: No orders of the defined types were placed in this encounter.   Follow Up: after cath  Signed, Elouise Munroe, MD  06/21/2019 8:27 AM    [place]

## 2019-06-21 NOTE — H&P (View-Only) (Signed)
Virtual Visit via Telephone Note   This visit type was conducted due to national recommendations for restrictions regarding the COVID-19 Pandemic (e.g. social distancing) in an effort to limit this patient's exposure and mitigate transmission in our community.  Due to his co-morbid illnesses, this patient is at least at moderate risk for complications without adequate follow up.  This format is felt to be most appropriate for this patient at this time.  The patient did not have access to video technology/had technical difficulties with video requiring transitioning to audio format only (telephone).  All issues noted in this document were discussed and addressed.  No physical exam could be performed with this format.  Please refer to the patient's chart for his  consent to telehealth for [facility].   The patient was identified using 2 identifiers.  Date:  06/21/2019   ID:  Juan [name], DOB 29-Jan-1961, MRN [id number]  Patient Location: Home Provider Location: Home Office  PCP:  Harlan Stains, MD  Cardiologist:  No primary care provider on file.  Electrophysiologist:  None   Evaluation Performed:  Follow-Up Visit  Chief Complaint:  SOB, abnormal stress test with CAC  History of Present Illness:    Juan [name] is a 59 y.o. male with DOE and chest tightness with incidentally noted CAC on chest CT, with dense calcium in the LM and proximal LAD who presents after stress testing.   Stress study shows apical ischemia, however study was not performed on D-Spect camera, there fore cannot exclude possibility of apical attenuation artifact due to body habitus. Test stopped per report due to angina, and patient notes this occurred at the very end of exercise. Findings overall were low risk to low-intermediate risk.   Discussed this in detail with patient, that stress study suggestive of ischemia but in a small, distal area. We participated in shared decision making.   The patient  does not have symptoms concerning for COVID-19 infection (fever, chills, cough, or new shortness of breath).    Past Medical History:  Diagnosis Date  . Arthritis   . Asthma   . COPD (chronic obstructive pulmonary disease) ([place])   . Diabetes mellitus    Type 2  . Diabetic retinopathy ([place])    NPDR OU  . GERD (gastroesophageal reflux disease)   . H/O hiatal hernia   . History of kidney stones   . Hyperlipemia   . Hypertension   . Hypertensive retinopathy    OU  . Nasal polyps   . Pneumonia 2014  . Shortness of breath   . Sleep apnea    pt has CPAP but is unable to use it d/t having polyps in his nose. Pt stated "I need to call and get a CPAP mask instead"  . Stomach ulcer    Past Surgical History:  Procedure Laterality Date  . CATARACT EXTRACTION Right 07/05/2018   Dr. Gershon Crane  . CATARACT EXTRACTION Left 07/12/2018   Dr. Gershon Crane  . CHOLECYSTECTOMY N/A 01/23/2015   Procedure: LAPAROSCOPIC CHOLECYSTECTOMY ;  Surgeon: Georganna Skeans, MD;  Location: [place];  Service: General;  Laterality: N/A;  . COLONOSCOPY W/ POLYPECTOMY    . ESOPHAGOGASTRODUODENOSCOPY    . EYE SURGERY    . KNEE ARTHROSCOPY  03/24/2011   Procedure: ARTHROSCOPY KNEE;  Surgeon: Alta Corning, MD;  Location: [place];  Service: Orthopedics;  Laterality: Right;  partial medial menisectomy, chondroplaty patella, and medial medial plica     Current Meds  Medication Sig  .  ADVAIR DISKUS 500-50 MCG/DOSE AEPB Inhale 1 puff into the lungs 2 (two) times daily.  Marland Kitchen albuterol (PROVENTIL HFA;VENTOLIN HFA) 108 (90 BASE) MCG/ACT inhaler Inhale 1-2 puffs into the lungs every 6 (six) hours as needed for wheezing or shortness of breath.   Marland Kitchen albuterol (PROVENTIL) (2.5 MG/3ML) 0.083% nebulizer solution INHALE CONTENTS OF ONE VIAL IN NEBULIZER THREE TIMES DAILY AS NEEDED  . alfuzosin (UROXATRAL) 10 MG 24 hr tablet TAKE 1 TABLET BY MOUTH EVERYDAY AT BEDTIME  . aspirin EC 81 MG tablet Take 81 mg by mouth daily.  Marland Kitchen  atorvastatin (LIPITOR) 40 MG tablet Take 40 mg by mouth daily.  . dorzolamide-timolol (COSOPT) 22.3-6.8 MG/ML ophthalmic solution Place 1 drop into the right eye 2 (two) times daily.  Marland Kitchen doxycycline (VIBRA-TABS) 100 MG tablet Take 1 tablet (100 mg total) by mouth 2 (two) times daily.  . Empagliflozin-metFORMIN HCl (SYNJARDY) 12.06-998 MG TABS Take by mouth 2 (two) times a day.  . esomeprazole (NEXIUM) 40 MG capsule Take 40 mg by mouth daily at 12 noon.  Marland Kitchen glimepiride (AMARYL) 4 MG tablet Take 4 mg by mouth 2 (two) times daily.  Marland Kitchen HYDROMET 5-1.5 MG/5ML syrup TAKE ONE TEASPOONFUL BY MOUTH EVERY 4 TO 6 HOURS AS NEEDED FOR COUGH  . insulin glargine, 2 Unit Dial, (TOUJEO MAX SOLOSTAR) 300 UNIT/ML Solostar Pen Inject into the skin.  Marland Kitchen ketorolac (ACULAR) 0.5 % ophthalmic solution Place 1 drop into both eyes 4 (four) times daily.  Marland Kitchen KOMBIGLYZE XR 2.06-998 MG TB24 Take 2 tablets by mouth at bedtime.   Marland Kitchen lisinopril-hydrochlorothiazide (PRINZIDE,ZESTORETIC) 20-25 MG per tablet Take 1 tablet by mouth daily.  . Multiple Vitamins-Minerals (CENTRUM SILVER 50+MEN PO) as directed.  Marland Kitchen ofloxacin (OCUFLOX) 0.3 % ophthalmic solution Place 1 drop into both eyes 3 (three) times daily.  . prednisoLONE acetate (PRED FORTE) 1 % ophthalmic suspension Place 1 drop into both eyes 4 (four) times daily.     Allergies:   Naproxen, Naproxen sodium, and Penicillins   Social History   Tobacco Use  . Smoking status: Former Smoker    Years: 20.00    Types: Cigarettes  . Smokeless tobacco: Current User    Types: Chew  . Tobacco comment: 1 can/daily  Substance Use Topics  . Alcohol use: Yes    Comment: social, 1x weekly  . Drug use: No     Family Hx: The patient's family history includes Diabetes in his father.  ROS:   Please see the history of present illness.     All other systems reviewed and are negative.   Prior CV studies:   The following studies were reviewed today:    Labs/Other Tests and Data  Reviewed:    EKG:  No ECG reviewed.  Recent Labs: 04/11/2019: BUN 15; Creatinine, Ser 1.17; Hemoglobin 9.8; Platelets 118; Potassium 3.8; Sodium 141   Recent Lipid Panel Lab Results  Component Value Date/Time   CHOL  03/05/2010 03:10 AM    90        ATP III CLASSIFICATION:  <200     mg/dL   Desirable  200-239  mg/dL   Borderline High  >=240    mg/dL   High          TRIG 66 03/05/2010 03:10 AM   HDL 32 (L) 03/05/2010 03:10 AM   CHOLHDL 2.8 03/05/2010 03:10 AM   LDLCALC  03/05/2010 03:10 AM    45        Total Cholesterol/HDL:CHD Risk Coronary Heart Disease Risk  Table                     Men   Women  1/2 Average Risk   3.4   3.3  Average Risk       5.0   4.4  2 X Average Risk   9.6   7.1  3 X Average Risk  23.4   11.0        Use the calculated Patient Ratio above and the CHD Risk Table to determine the patient's CHD Risk.        ATP III CLASSIFICATION (LDL):  <100     mg/dL   Optimal  100-129  mg/dL   Near or Above                    Optimal  130-159  mg/dL   Borderline  160-189  mg/dL   High  >190     mg/dL   Very High    Wt Readings from Last 3 Encounters:  06/21/19 230 lb (104.3 kg)  06/19/19 227 lb (103 kg)  06/01/19 227 lb (103 kg)     Objective:    Vital Signs:  Ht 5\' 9"  (1.753 m)   Wt 230 lb (104.3 kg)   BMI 33.97 kg/m    VITAL SIGNS:  reviewed GEN:  no acute distress RESPIRATORY:  normal respiratory effort, no increased work of breathing NEURO:  alert and oriented x 3, speech normal PSYCH:  normal affect   ASSESSMENT & PLAN:    Chest pain, unspecified type  Abnormal cardiovascular stress test - discussed in detail the management of CAC and abnormal stress test. Shared decision making performed. Patient would like to be proactive and purse coronary angiography, in setting of abnormal stress test and exertional chest tightness, it is reasonable to define coronary anatomy and assess for high grade stenosis with LHC. Will plan this for next  available.  INFORMED CONSENT: I have reviewed the risks, indications, and alternatives to cardiac catheterization, possible angioplasty, and stenting with the patient. Risks include but are not limited to bleeding, infection, vascular injury, stroke, myocardial infection, arrhythmia, kidney injury, radiation-related injury in the case of prolonged fluoroscopy use, emergency cardiac surgery, and death. The patient understands the risks of serious complication is 1-2 in 123XX123 with diagnostic cardiac cath and 1-2% or less with angioplasty/stenting.   Pre-op testing - Plan: CBC, Basic metabolic panel  Essential hypertension - continue lisinopril  Hyperlipidemia, unspecified hyperlipidemia type - continue atorvastatin.   OSA (obstructive sleep apnea) - continue CPAP.    COVID-19 Education: The signs and symptoms of COVID-19 were discussed with the patient and how to seek care for testing (follow up with PCP or arrange E-visit).  The importance of social distancing was discussed today.  Time:   Today, I have spent 25 minutes with the patient with telehealth technology discussing the above problems.     Medication Adjustments/Labs and Tests Ordered: Current medicines are reviewed at length with the patient today.  Concerns regarding medicines are outlined above.   Tests Ordered: Orders Placed This Encounter  Procedures  . CBC  . Basic metabolic panel    Medication Changes: No orders of the defined types were placed in this encounter.   Follow Up: after cath  Signed, Elouise Munroe, MD  06/21/2019 8:27 AM    [place]

## 2019-06-21 NOTE — Telephone Encounter (Signed)
  Patient Consent for Virtual Visit         Juan [name] has provided verbal consent on 06/21/2019 for a virtual visit (video or telephone).   CONSENT FOR VIRTUAL VISIT FOR:  Juan [name]  By participating in this virtual visit I agree to the following:  I hereby voluntarily request, consent and authorize [facility] and its employed or contracted physicians, physician assistants, nurse practitioners or other licensed health care professionals (the Practitioner), to provide me with telemedicine health care services (the "Services") as deemed necessary by the treating Practitioner. I acknowledge and consent to receive the Services by the Practitioner via telemedicine. I understand that the telemedicine visit will involve communicating with the Practitioner through live audiovisual communication technology and the disclosure of certain medical information by electronic transmission. I acknowledge that I have been given the opportunity to request an in-person assessment or other available alternative prior to the telemedicine visit and am voluntarily participating in the telemedicine visit.  I understand that I have the right to withhold or withdraw my consent to the use of telemedicine in the course of my care at any time, without affecting my right to future care or treatment, and that the Practitioner or I may terminate the telemedicine visit at any time. I understand that I have the right to inspect all information obtained and/or recorded in the course of the telemedicine visit and may receive copies of available information for a reasonable fee.  I understand that some of the potential risks of receiving the Services via telemedicine include:  Marland Kitchen Delay or interruption in medical evaluation due to technological equipment failure or disruption; . Information transmitted may not be sufficient (e.g. poor resolution of images) to allow for appropriate medical decision making by the  Practitioner; and/or  . In rare instances, security protocols could fail, causing a breach of personal health information.  Furthermore, I acknowledge that it is my responsibility to provide information about my medical history, conditions and care that is complete and accurate to the best of my ability. I acknowledge that Practitioner's advice, recommendations, and/or decision may be based on factors not within their control, such as incomplete or inaccurate data provided by me or distortions of diagnostic images or specimens that may result from electronic transmissions. I understand that the practice of medicine is not an exact science and that Practitioner makes no warranties or guarantees regarding treatment outcomes. I acknowledge that a copy of this consent can be made available to me via my patient portal ([facility]), or I can request a printed copy by calling the office of [facility].    I understand that my insurance will be billed for this visit.   I have read or had this consent read to me. . I understand the contents of this consent, which adequately explains the benefits and risks of the Services being provided via telemedicine.  . I have been provided ample opportunity to ask questions regarding this consent and the Services and have had my questions answered to my satisfaction. . I give my informed consent for the services to be provided through the use of telemedicine in my medical care

## 2019-06-21 NOTE — Telephone Encounter (Signed)
Left a message for the patient to call back to go over cath instructions:  You are scheduled for a Cardiac Catheterization on Friday, May 28 with Dr. Kathlyn Sacramento.  1. Please arrive at the [facility] (Main Entrance A) at [facility]: [address] [place], [place] [address] at 5:30 AM (This time is two hours before your procedure to ensure your preparation). Free valet parking service is available.   Special note: Every effort is made to have your procedure done on time. Please understand that emergencies sometimes delay scheduled procedures.  2. Diet: Do not eat solid foods after midnight.  The patient may have clear liquids until 5am upon the day of the procedure.  3. Labs:  Your provider would like for you to return  06/26/19 to have the following labs drawn: CBC and BMET. You do not need an appointment for the lab. Once in our office lobby there is a podium where you can sign in and ring the doorbell to alert [place] that you are here. The lab is open from 8:00 am to 4:30 pm; closed for lunch from 12:45pm-1:45pm.  You will need to have the coronavirus test completed prior to your procedure. An appointment has been made at 9:20 am on 06/26/19. This is [facility] [employer] [address] Someone will direct you to the appropriate testing line. Please tell them that you are there for procedure testing. Stay in your car and someone will be with you shortly. Please make sure to have all other labs completed before this test because you will need to stay quarantined until your procedure.  4. Medication instructions in preparation for your procedure: Hold all diabetic medication the morning of the catheterization. Hold Synjardy the morning of the procedure and 48 hours after since this does contain Metformin. TAKE half the dose of the Insulin Glargine the night before the procedure.  Hold the Lisinopril-Hydrochlorothiazide the morning of the  procedure.   On the morning of your procedure, take your Aspirin and any morning medicines NOT listed above.  You may use sips of water.

## 2019-06-23 ENCOUNTER — Encounter: Payer: Self-pay | Admitting: *Deleted

## 2019-06-25 ENCOUNTER — Telehealth: Payer: Self-pay | Admitting: Internal Medicine

## 2019-06-25 NOTE — Telephone Encounter (Signed)
Patient has been made aware that instructions have been mailed and sent to [place]. Per his request, his covid test has been changed to 2:35 pm on 5/25. He will come to the [place] office for his pre op labs.   He has been advised to call back if he has any questions.

## 2019-06-25 NOTE — Telephone Encounter (Signed)
Patient has a heart cath scheduled for this Friday 06/29/19. He wants to know if it can be scheduled for later in the day but if not that is perfectly fine. Please advise.

## 2019-06-26 ENCOUNTER — Other Ambulatory Visit (HOSPITAL_COMMUNITY): Payer: BC Managed Care – PPO

## 2019-06-26 ENCOUNTER — Other Ambulatory Visit (HOSPITAL_COMMUNITY)
Admission: RE | Admit: 2019-06-26 | Discharge: 2019-06-26 | Disposition: A | Discharge status: Home / Self Care | Payer: BC Managed Care – PPO | Source: Ambulatory Visit | Attending: Cardiovascular Disease | Admitting: Cardiovascular Disease

## 2019-06-26 DIAGNOSIS — Z01812 Encounter for preprocedural laboratory examination: Secondary | ICD-10-CM | POA: Insufficient documentation

## 2019-06-26 DIAGNOSIS — Z01818 Encounter for other preprocedural examination: Secondary | ICD-10-CM | POA: Diagnosis not present

## 2019-06-26 DIAGNOSIS — R079 Chest pain, unspecified: Secondary | ICD-10-CM | POA: Diagnosis not present

## 2019-06-26 DIAGNOSIS — Z20822 Contact with and (suspected) exposure to covid-19: Secondary | ICD-10-CM | POA: Insufficient documentation

## 2019-06-26 NOTE — Telephone Encounter (Signed)
Called left message  For patient to call back .   Maybe able to change cath time slot  to 10 am if needed. Awaiting to discuss with patient.

## 2019-06-27 LAB — BASIC METABOLIC PANEL
BUN/Creatinine Ratio: 16 (ref 9–20)
BUN: 21 mg/dL (ref 6–24)
CO2: 22 mmol/L (ref 20–29)
Calcium: 9.6 mg/dL (ref 8.7–10.2)
Chloride: 103 mmol/L (ref 96–106)
Creatinine, Ser: 1.33 mg/dL — ABNORMAL HIGH (ref 0.76–1.27)
GFR calc Af Amer: 68 mL/min/{1.73_m2} (ref >59)
GFR calc non Af Amer: 59 mL/min/{1.73_m2} — ABNORMAL LOW (ref >59)
Glucose: 131 mg/dL — ABNORMAL HIGH (ref 65–99)
Potassium: 4.8 mmol/L (ref 3.5–5.2)
Sodium: 140 mmol/L (ref 134–144)

## 2019-06-27 LAB — SARS CORONAVIRUS 2 (TAT 6-24 HRS): SARS Coronavirus 2: NEGATIVE

## 2019-06-27 LAB — CBC
Hematocrit: 33.1 % — ABNORMAL LOW (ref 37.5–51.0)
Hemoglobin: 10.5 g/dL — ABNORMAL LOW (ref 13.0–17.7)
MCH: 25.5 pg — ABNORMAL LOW (ref 26.6–33.0)
MCHC: 31.7 g/dL (ref 31.5–35.7)
MCV: 80 fL (ref 79–97)
Platelets: 134 10*3/uL — ABNORMAL LOW (ref 150–450)
RBC: 4.12 x10E6/uL — ABNORMAL LOW (ref 4.14–5.80)
RDW: 16 % — ABNORMAL HIGH (ref 11.6–15.4)
WBC: 4.8 10*3/uL (ref 3.4–10.8)

## 2019-06-28 ENCOUNTER — Telehealth: Payer: Self-pay | Admitting: *Deleted

## 2019-06-28 NOTE — Telephone Encounter (Signed)
Pt contacted pre-catheterization scheduled at [facility] for: Friday Jun 29, 2019 7:30 AM Verified arrival time and place: [facility]) at: 5:30 AM   No solid food after midnight prior to cath, clear liquids until 5 AM day of procedure.  Hold: Lisinopril-HCT-AM of procedure Amaryl-AM of procedure Insulin-AM of procedure/1/2 usual HS dose PM prior to procedure if taking Synjardy -AM of procedure and 48 hours post procedure. Kombiglyze-on medication list HS -not sure if patient is taking.  Except hold medications AM meds can be  taken pre-cath with sip of water including: ASA 81 mg   Confirmed patient has responsible adult to drive home post procedure and observe 24 hours after arriving home:   You are allowed ONE visitor in the waiting room during your procedure. Both you and your visitor must wear masks.      COVID-19 Pre-Screening Questions:  . In the past 7 to 10 days have you had a cough,  shortness of breath, headache, congestion, fever (100 or greater) body aches, chills, sore throat, or sudden loss of taste or sense of smell? . Have you been around anyone with known Covid 19 in the past 7 to 10 days? . Have you been around anyone who is awaiting Covid 19 test results in the past 7 to 10 days? . Have you been around anyone who has mentioned symptoms of Covid 19 within the past 7 to 10 days?   LMTCB to review procedure instructions with patient.

## 2019-06-28 NOTE — Telephone Encounter (Signed)
Voicemail message, no answer. 

## 2019-06-29 ENCOUNTER — Telehealth: Payer: Self-pay | Admitting: Internal Medicine

## 2019-06-29 ENCOUNTER — Ambulatory Visit (HOSPITAL_COMMUNITY)
Admission: RE | Admit: 2019-06-29 | Discharge: 2019-06-29 | Disposition: A | Discharge status: Home / Self Care | Payer: BC Managed Care – PPO | Attending: Cardiovascular Disease | Admitting: Cardiovascular Disease

## 2019-06-29 ENCOUNTER — Ambulatory Visit (HOSPITAL_COMMUNITY)
Admission: RE | Disposition: A | Discharge status: Home / Self Care | Payer: BC Managed Care – PPO | Source: Home / Self Care | Attending: Cardiovascular Disease

## 2019-06-29 ENCOUNTER — Other Ambulatory Visit: Payer: Self-pay

## 2019-06-29 DIAGNOSIS — Z794 Long term (current) use of insulin: Secondary | ICD-10-CM | POA: Diagnosis not present

## 2019-06-29 DIAGNOSIS — I10 Essential (primary) hypertension: Secondary | ICD-10-CM | POA: Insufficient documentation

## 2019-06-29 DIAGNOSIS — I25119 Atherosclerotic heart disease of native coronary artery with unspecified angina pectoris: Secondary | ICD-10-CM | POA: Insufficient documentation

## 2019-06-29 DIAGNOSIS — F17290 Nicotine dependence, other tobacco product, uncomplicated: Secondary | ICD-10-CM | POA: Insufficient documentation

## 2019-06-29 DIAGNOSIS — Z79899 Other long term (current) drug therapy: Secondary | ICD-10-CM | POA: Diagnosis not present

## 2019-06-29 DIAGNOSIS — J449 Chronic obstructive pulmonary disease, unspecified: Secondary | ICD-10-CM | POA: Insufficient documentation

## 2019-06-29 DIAGNOSIS — E785 Hyperlipidemia, unspecified: Secondary | ICD-10-CM | POA: Diagnosis not present

## 2019-06-29 DIAGNOSIS — R9439 Abnormal result of other cardiovascular function study: Secondary | ICD-10-CM | POA: Diagnosis not present

## 2019-06-29 DIAGNOSIS — E119 Type 2 diabetes mellitus without complications: Secondary | ICD-10-CM | POA: Diagnosis not present

## 2019-06-29 DIAGNOSIS — R079 Chest pain, unspecified: Secondary | ICD-10-CM

## 2019-06-29 DIAGNOSIS — Z7982 Long term (current) use of aspirin: Secondary | ICD-10-CM | POA: Diagnosis not present

## 2019-06-29 DIAGNOSIS — K219 Gastro-esophageal reflux disease without esophagitis: Secondary | ICD-10-CM | POA: Diagnosis not present

## 2019-06-29 DIAGNOSIS — Z886 Allergy status to analgesic agent status: Secondary | ICD-10-CM | POA: Diagnosis not present

## 2019-06-29 DIAGNOSIS — G4733 Obstructive sleep apnea (adult) (pediatric): Secondary | ICD-10-CM | POA: Insufficient documentation

## 2019-06-29 DIAGNOSIS — Z7951 Long term (current) use of inhaled steroids: Secondary | ICD-10-CM | POA: Insufficient documentation

## 2019-06-29 DIAGNOSIS — Z880 Allergy status to penicillin: Secondary | ICD-10-CM | POA: Diagnosis not present

## 2019-06-29 HISTORY — PX: LEFT HEART CATH AND CORONARY ANGIOGRAPHY: CATH118249

## 2019-06-29 LAB — GLUCOSE, CAPILLARY: Glucose-Capillary: 139 mg/dL — ABNORMAL HIGH (ref 70–99)

## 2019-06-29 SURGERY — LEFT HEART CATH AND CORONARY ANGIOGRAPHY
Anesthesia: LOCAL

## 2019-06-29 MED ORDER — CARVEDILOL 3.125 MG PO TABS
3.1250 mg | ORAL_TABLET | Freq: Two times a day (BID) | ORAL | 3 refills | Status: DC
Start: 2019-06-29 — End: 2019-07-20

## 2019-06-29 MED ORDER — HEPARIN (PORCINE) IN NACL 1000-0.9 UT/500ML-% IV SOLN
INTRAVENOUS | Status: AC
  Filled 2019-06-29: qty 500

## 2019-06-29 MED ORDER — SODIUM CHLORIDE 0.9% FLUSH: 3.0000 mL | INTRAVENOUS | Status: DC | PRN

## 2019-06-29 MED ORDER — SODIUM CHLORIDE 0.9 % WEIGHT BASED INFUSION
3.0000 mL/kg/h | INTRAVENOUS | Status: AC
  Administered 2019-06-29: 3 mL/kg/h via INTRAVENOUS

## 2019-06-29 MED ORDER — SODIUM CHLORIDE 0.9% FLUSH: 3.0000 mL | Freq: Two times a day (BID) | INTRAVENOUS | Status: DC

## 2019-06-29 MED ORDER — VERAPAMIL HCL 2.5 MG/ML IV SOLN
INTRAVENOUS | Status: DC | PRN
  Administered 2019-06-29: 10 mL via INTRA_ARTERIAL

## 2019-06-29 MED ORDER — MIDAZOLAM HCL 2 MG/2ML IJ SOLN
INTRAMUSCULAR | Status: AC
  Filled 2019-06-29: qty 2

## 2019-06-29 MED ORDER — ASPIRIN 81 MG PO CHEW
81.0000 mg | CHEWABLE_TABLET | ORAL | Status: AC
  Administered 2019-06-29: 81 mg via ORAL
  Filled 2019-06-29: qty 1

## 2019-06-29 MED ORDER — VERAPAMIL HCL 2.5 MG/ML IV SOLN
INTRAVENOUS | Status: AC
  Filled 2019-06-29: qty 2

## 2019-06-29 MED ORDER — SODIUM CHLORIDE 0.9 % WEIGHT BASED INFUSION: 1.0000 mL/kg/h | INTRAVENOUS | Status: DC

## 2019-06-29 MED ORDER — IOHEXOL 350 MG/ML SOLN
INTRAVENOUS | Status: DC | PRN
  Administered 2019-06-29: 55 mL

## 2019-06-29 MED ORDER — SODIUM CHLORIDE 0.9 % IV SOLN: 250.0000 mL | INTRAVENOUS | Status: DC | PRN

## 2019-06-29 MED ORDER — MIDAZOLAM HCL 2 MG/2ML IJ SOLN
INTRAMUSCULAR | Status: DC | PRN
  Administered 2019-06-29: 1 mg via INTRAVENOUS

## 2019-06-29 MED ORDER — LIDOCAINE HCL (PF) 1 % IJ SOLN
INTRAMUSCULAR | Status: DC | PRN
  Administered 2019-06-29: 2 mL

## 2019-06-29 MED ORDER — LIDOCAINE HCL (PF) 1 % IJ SOLN
INTRAMUSCULAR | Status: AC
  Filled 2019-06-29: qty 30

## 2019-06-29 MED ORDER — HEPARIN (PORCINE) IN NACL 1000-0.9 UT/500ML-% IV SOLN
INTRAVENOUS | Status: DC | PRN
  Administered 2019-06-29 (×2): 500 mL

## 2019-06-29 MED ORDER — ACETAMINOPHEN 325 MG PO TABS: 650.0000 mg | ORAL_TABLET | ORAL | Status: DC | PRN

## 2019-06-29 MED ORDER — ONDANSETRON HCL 4 MG/2ML IJ SOLN: 4.0000 mg | Freq: Four times a day (QID) | INTRAMUSCULAR | Status: DC | PRN

## 2019-06-29 MED ORDER — SODIUM CHLORIDE 0.9 % IV SOLN: INTRAVENOUS | Status: DC

## 2019-06-29 MED ORDER — HEPARIN (PORCINE) IN NACL 1000-0.9 UT/500ML-% IV SOLN
INTRAVENOUS | Status: AC
  Filled 2019-06-29: qty 1000

## 2019-06-29 MED ORDER — LABETALOL HCL 5 MG/ML IV SOLN: 10.0000 mg | INTRAVENOUS | Status: DC | PRN

## 2019-06-29 MED ORDER — HEPARIN SODIUM (PORCINE) 1000 UNIT/ML IJ SOLN
INTRAMUSCULAR | Status: DC | PRN
  Administered 2019-06-29: 5000 [IU] via INTRAVENOUS

## 2019-06-29 MED ORDER — HEPARIN SODIUM (PORCINE) 1000 UNIT/ML IJ SOLN
INTRAMUSCULAR | Status: AC
  Filled 2019-06-29: qty 1

## 2019-06-29 MED ORDER — FENTANYL CITRATE (PF) 100 MCG/2ML IJ SOLN
INTRAMUSCULAR | Status: DC | PRN
  Administered 2019-06-29: 25 ug via INTRAVENOUS

## 2019-06-29 MED ORDER — FENTANYL CITRATE (PF) 100 MCG/2ML IJ SOLN
INTRAMUSCULAR | Status: AC
  Filled 2019-06-29: qty 2

## 2019-06-29 SURGICAL SUPPLY — 12 items
CATH INFINITI 5 FR JL3.5 (CATHETERS) ×2 IMPLANT
CATH INFINITI 5FR JK (CATHETERS) ×2 IMPLANT
CATH INFINITI JR4 5F (CATHETERS) ×2 IMPLANT
DEVICE RAD COMP TR BAND LRG (VASCULAR PRODUCTS) ×2 IMPLANT
GLIDESHEATH SLEND SS 6F .021 (SHEATH) ×2 IMPLANT
GUIDEWIRE INQWIRE 1.5J.035X260 (WIRE) ×1 IMPLANT
INQWIRE 1.5J .035X260CM (WIRE) ×2
KIT ENCORE 26 ADVANTAGE (KITS) IMPLANT
KIT HEART LEFT (KITS) ×2 IMPLANT
PACK CARDIAC CATHETERIZATION (CUSTOM PROCEDURE TRAY) ×2 IMPLANT
TRANSDUCER W/STOPCOCK (MISCELLANEOUS) ×2 IMPLANT
TUBING CIL FLEX 10 FLL-RA (TUBING) ×2 IMPLANT

## 2019-06-29 NOTE — Telephone Encounter (Signed)
Patient states he is requesting to speak with Dr. Delphina Cahill nurse in regards to heart cath completed today, 06/29/19. Please call.

## 2019-06-29 NOTE — Telephone Encounter (Signed)
I called the patient and answered questions about cath. We discussed that medical therapy is the appropriate management strategy based on results of cath. He has been started on carvedilol and will start this today.   Please move patient's appointment from 6/1 out about 2 weeks and we will review medical therapy at that time.   All questions answered.

## 2019-06-29 NOTE — Discharge Instructions (Signed)
HOLD SYNJARDY // 5/31  RESTART Monday Radial Site Care  This sheet gives you information about how to care for yourself after your procedure. Your health care provider may also give you more specific instructions. If you have problems or questions, contact your health care provider. What can I expect after the procedure? After the procedure, it is common to have:  Bruising and tenderness at the catheter insertion area. Follow these instructions at home: Medicines  Take over-the-counter and prescription medicines only as told by your health care provider. Insertion site care  Follow instructions from your health care provider about how to take care of your insertion site. Make sure you: ? Wash your hands with soap and water before you change your bandage (dressing). If soap and water are not available, use hand sanitizer. ? Change your dressing as told by your health care provider. ? Leave stitches (sutures), skin glue, or adhesive strips in place. These skin closures may need to stay in place for 2 weeks or longer. If adhesive strip edges start to loosen and curl up, you may trim the loose edges. Do not remove adhesive strips completely unless your health care provider tells you to do that.  Check your insertion site every day for signs of infection. Check for: ? Redness, swelling, or pain. ? Fluid or blood. ? Pus or a bad smell. ? Warmth.  Do not take baths, swim, or use a hot tub until your health care provider approves.  You may shower 24-48 hours after the procedure, or as directed by your health care provider. ? Remove the dressing and gently wash the site with plain soap and water. ? Pat the area dry with a clean towel. ? Do not rub the site. That could cause bleeding.  Do not apply powder or lotion to the site. Activity   For 24 hours after the procedure, or as directed by your health care provider: ? Do not flex or bend the affected arm. ? Do not push or pull heavy  objects with the affected arm. ? Do not drive yourself home from the hospital or clinic. You may drive 24 hours after the procedure unless your health care provider tells you not to. ? Do not operate machinery or power tools.  Do not lift anything that is heavier than 10 lb (4.5 kg), or the limit that you are told, until your health care provider says that it is safe.  Ask your health care provider when it is okay to: ? Return to work or school. ? Resume usual physical activities or sports. ? Resume sexual activity. General instructions  If the catheter site starts to bleed, raise your arm and put firm pressure on the site. If the bleeding does not stop, get help right away. This is a medical emergency.  If you went home on the same day as your procedure, a responsible adult should be with you for the first 24 hours after you arrive home.  Keep all follow-up visits as told by your health care provider. This is important. Contact a health care provider if:  You have a fever.  You have redness, swelling, or yellow drainage around your insertion site. Get help right away if:  You have unusual pain at the radial site.  The catheter insertion area swells very fast.  The insertion area is bleeding, and the bleeding does not stop when you hold steady pressure on the area.  Your arm or hand becomes pale, cool, tingly, or numb.  These symptoms may represent a serious problem that is an emergency. Do not wait to see if the symptoms will go away. Get medical help right away. Call your local emergency services (911 in the [place] Do not drive yourself to the hospital. Summary  After the procedure, it is common to have bruising and tenderness at the site.  Follow instructions from your health care provider about how to take care of your radial site wound. Check the wound every day for signs of infection.  Do not lift anything that is heavier than 10 lb (4.5 kg), or the limit that you are told,  until your health care provider says that it is safe. This information is not intended to replace advice given to you by your health care provider. Make sure you discuss any questions you have with your health care provider. Document Revised: 02/23/2017 Document Reviewed: 02/23/2017 Elsevier Patient Education  2020 [employer].

## 2019-06-29 NOTE — Telephone Encounter (Signed)
Spoke with patient regarding cath procedure today. Patient felt like he should have had 2 stents that were not done    Scheduled follow up visit for 6/1

## 2019-06-29 NOTE — Telephone Encounter (Signed)
Scheduled follow up appointment 2 weeks out

## 2019-06-29 NOTE — Interval H&P Note (Signed)
Cath Lab Visit (complete for each Cath Lab visit)  Clinical Evaluation Leading to the Procedure:   ACS: No.  Non-ACS:    Anginal Classification: CCS III  Anti-ischemic medical therapy: No Therapy  Non-Invasive Test Results: Low-risk stress test findings: cardiac mortality <1%/year  Prior CABG: No previous CABG      History and Physical Interval Note:  06/29/2019 10:19 AM  Juan [name]  has presented today for surgery, with the diagnosis of Abnormal stress test.  The various methods of treatment have been discussed with the patient and family. After consideration of risks, benefits and other options for treatment, the patient has consented to  Procedure(s): LEFT HEART CATH AND CORONARY ANGIOGRAPHY (N/A) as a surgical intervention.  The patient's history has been reviewed, patient examined, no change in status, stable for surgery.  I have reviewed the patient's chart and labs.  Questions were answered to the patient's satisfaction.     Kathlyn Sacramento

## 2019-06-30 MED FILL — Heparin Sod (Porcine)-NaCl IV Soln 1000 Unit/500ML-0.9%: INTRAVENOUS | Qty: 1000 | Status: AC

## 2019-06-30 MED FILL — Lidocaine HCl Local Preservative Free (PF) Inj 1%: INTRAMUSCULAR | Qty: 30 | Status: AC

## 2019-06-30 MED FILL — Verapamil HCl IV Soln 2.5 MG/ML: INTRAVENOUS | Qty: 2 | Status: AC

## 2019-07-03 ENCOUNTER — Ambulatory Visit: Payer: BC Managed Care – PPO | Admitting: Internal Medicine

## 2019-07-13 ENCOUNTER — Ambulatory Visit: Payer: BC Managed Care – PPO | Admitting: Internal Medicine

## 2019-07-13 ENCOUNTER — Encounter: Payer: Self-pay | Admitting: Internal Medicine

## 2019-07-13 ENCOUNTER — Other Ambulatory Visit: Payer: Self-pay

## 2019-07-13 VITALS — BP 122/62 | HR 95 | Ht 69.0 in | Wt 232.6 lb

## 2019-07-13 DIAGNOSIS — R9439 Abnormal result of other cardiovascular function study: Secondary | ICD-10-CM

## 2019-07-13 DIAGNOSIS — E785 Hyperlipidemia, unspecified: Secondary | ICD-10-CM | POA: Diagnosis not present

## 2019-07-13 DIAGNOSIS — I10 Essential (primary) hypertension: Secondary | ICD-10-CM | POA: Diagnosis not present

## 2019-07-13 DIAGNOSIS — J449 Chronic obstructive pulmonary disease, unspecified: Secondary | ICD-10-CM

## 2019-07-13 DIAGNOSIS — I2510 Atherosclerotic heart disease of native coronary artery without angina pectoris: Secondary | ICD-10-CM | POA: Diagnosis not present

## 2019-07-13 DIAGNOSIS — I2584 Coronary atherosclerosis due to calcified coronary lesion: Secondary | ICD-10-CM

## 2019-07-13 DIAGNOSIS — G4733 Obstructive sleep apnea (adult) (pediatric): Secondary | ICD-10-CM

## 2019-07-13 NOTE — Progress Notes (Signed)
Cardiology Office Note:    Date:  07/13/2019   ID:  Juan [name], DOB 12-05-1960, MRN [id number]  PCP:  Harlan Stains, MD  Cardiologist:  No primary care provider on file.  Electrophysiologist:  None   Referring MD: Harlan Stains, MD   Chief Complaint: CAD  History of Present Illness:    Juan [name] is a 59 y.o. male with a history of moderate-severely calcified coronary artery disease on LHC performed for abnormal stress test, with distal circumflex disease recommended for medical management given discrepancy between area of ischemia and lesion location. I discussed this by phone with the patient after his cath and we started carvedilol for medical therapy of CAD.   He denies significant chest pain but does note continued SOB related to his known history of COPD. He is established with pulmonary medicine.   Medical therapy for CAD includes: ASA 81 mg daily, atorvastatin 40 mg daily, carvedilol 3.125 mg BID. We will add nitro to his regimen as needed for chest pain or paroxysms of SOB.    Past Medical History:  Diagnosis Date   Arthritis    Asthma    COPD (chronic obstructive pulmonary disease) ([place])    Diabetes mellitus    Type 2   Diabetic retinopathy ([place])    NPDR OU   GERD (gastroesophageal reflux disease)    H/O hiatal hernia    History of kidney stones    Hyperlipemia    Hypertension    Hypertensive retinopathy    OU   Nasal polyps    Pneumonia 2014   Shortness of breath    Sleep apnea    pt has CPAP but is unable to use it d/t having polyps in his nose. Pt stated "I need to call and get a CPAP mask instead"   Stomach ulcer     Past Surgical History:  Procedure Laterality Date   CATARACT EXTRACTION Right 07/05/2018   Dr. Gershon Crane   CATARACT EXTRACTION Left 07/12/2018   Dr. Gershon Crane   CHOLECYSTECTOMY N/A 01/23/2015   Procedure: LAPAROSCOPIC CHOLECYSTECTOMY ;  Surgeon: Georganna Skeans, MD;  Location: [place];  Service: General;   Laterality: N/A;   COLONOSCOPY W/ POLYPECTOMY     ESOPHAGOGASTRODUODENOSCOPY     EYE SURGERY     KNEE ARTHROSCOPY  03/24/2011   Procedure: ARTHROSCOPY KNEE;  Surgeon: Alta Corning, MD;  Location: [place];  Service: Orthopedics;  Laterality: Right;  partial medial menisectomy, chondroplaty patella, and medial medial plica   LEFT HEART CATH AND CORONARY ANGIOGRAPHY N/A 06/29/2019   Procedure: LEFT HEART CATH AND CORONARY ANGIOGRAPHY;  Surgeon: Wellington Hampshire, MD;  Location: [facility];  Service: Cardiovascular;  Laterality: N/A;    Current Medications: Current Meds  Medication Sig   ADVAIR DISKUS 500-50 MCG/DOSE AEPB Inhale 1 puff into the lungs 2 (two) times daily.   albuterol (PROVENTIL HFA;VENTOLIN HFA) 108 (90 BASE) MCG/ACT inhaler Inhale 1-2 puffs into the lungs every 6 (six) hours as needed for wheezing or shortness of breath.    aspirin EC 81 MG tablet Take 81 mg by mouth daily.   atorvastatin (LIPITOR) 40 MG tablet Take 40 mg by mouth daily.   carvedilol (COREG) 3.125 MG tablet Take 1 tablet (3.125 mg total) by mouth 2 (two) times daily.   Empagliflozin-metFORMIN HCl (SYNJARDY) 12.06-998 MG TABS Take 1 tablet by mouth 2 (two) times a day.    esomeprazole (NEXIUM) 40 MG capsule Take 40 mg by mouth daily  at 12 noon.   insulin glargine, 2 Unit Dial, (TOUJEO MAX SOLOSTAR) 300 UNIT/ML Solostar Pen Inject 40 Units into the skin daily.    ketorolac (ACULAR) 0.5 % ophthalmic solution Place 1 drop into both eyes 4 (four) times daily. (Patient taking differently: Place 2 drops into both eyes in the morning and at bedtime. )   lisinopril (ZESTRIL) 40 MG tablet Take 40 mg by mouth daily.   prednisoLONE acetate (PRED FORTE) 1 % ophthalmic suspension Place 1 drop into both eyes 4 (four) times daily. (Patient taking differently: Place 2 drops into both eyes in the morning and at bedtime. )     Allergies:   Naproxen, Naproxen sodium, and Penicillins    Social History   Socioeconomic History   Marital status: Single    Spouse name: Not on file   Number of children: Not on file   Years of education: Not on file   Highest education level: Not on file  Occupational History   Not on file  Tobacco Use   Smoking status: Former Smoker    Years: 20.00    Types: Cigarettes   Smokeless tobacco: Current User    Types: Chew   Tobacco comment: 1 can/daily  Substance and Sexual Activity   Alcohol use: Yes    Comment: social, 1x weekly   Drug use: No   Sexual activity: Never  Other Topics Concern   Not on file  Social History Narrative   Not on file   Social Determinants of Health   Financial Resource Strain:    Difficulty of Paying Living Expenses:   Food Insecurity:    Worried About Charity fundraiser in the Last Year:    Arboriculturist in the Last Year:   Transportation Needs:    Film/video editor (Medical):    Lack of Transportation (Non-Medical):   Physical Activity:    Days of Exercise per Week:    Minutes of Exercise per Session:   Stress:    Feeling of Stress :   Social Connections:    Frequency of Communication with Friends and Family:    Frequency of Social Gatherings with Friends and Family:    Attends Religious Services:    Active Member of Clubs or Organizations:    Attends Archivist Meetings:    Marital Status:      Family History: The patient's family history includes Diabetes in his father.  ROS:   Please see the history of present illness.    All other systems reviewed and are negative.  EKGs/Labs/Other Studies Reviewed:    The following studies were reviewed today:  Recent Labs: 06/26/2019: BUN 21; Creatinine, Ser 1.33; Hemoglobin 10.5; Platelets 134; Potassium 4.8; Sodium 140  Recent Lipid Panel    Component Value Date/Time   CHOL  03/05/2010 0310    90        ATP III CLASSIFICATION:  <200     mg/dL   Desirable  200-239  mg/dL   Borderline  High  >=240    mg/dL   High          TRIG 66 03/05/2010 0310   HDL 32 (L) 03/05/2010 0310   CHOLHDL 2.8 03/05/2010 0310   VLDL 13 03/05/2010 0310   LDLCALC  03/05/2010 0310    45        Total Cholesterol/HDL:CHD Risk Coronary Heart Disease Risk Table  Men   Women  1/2 Average Risk   3.4   3.3  Average Risk       5.0   4.4  2 X Average Risk   9.6   7.1  3 X Average Risk  23.4   11.0        Use the calculated Patient Ratio above and the CHD Risk Table to determine the patient's CHD Risk.        ATP III CLASSIFICATION (LDL):  <100     mg/dL   Optimal  100-129  mg/dL   Near or Above                    Optimal  130-159  mg/dL   Borderline  160-189  mg/dL   High  >190     mg/dL   Very High    Physical Exam:    VS:  BP 122/62    Pulse 95    Ht 5\' 9"  (1.753 m)    Wt 232 lb 9.6 oz (105.5 kg)    SpO2 94%    BMI 34.35 kg/m     Wt Readings from Last 5 Encounters:  07/13/19 232 lb 9.6 oz (105.5 kg)  06/29/19 235 lb (106.6 kg)  06/21/19 230 lb (104.3 kg)  06/19/19 227 lb (103 kg)  06/01/19 227 lb (103 kg)     Constitutional: No acute distress Eyes: sclera non-icteric, normal conjunctiva and lids ENMT: normal dentition, moist mucous membranes Cardiovascular: regular rhythm, normal rate, no murmurs. S1 and S2 normal. Radial pulses normal bilaterally. No jugular venous distention.  Respiratory: clear to auscultation bilaterally GI : normal bowel sounds, soft and nontender. No distention.   MSK: extremities warm, well perfused. No edema.  NEURO: grossly nonfocal exam, moves all extremities. PSYCH: alert and oriented x 3, normal mood and affect.   ASSESSMENT:    1. Coronary artery disease due to calcified coronary lesion   2. Abnormal stress test   3. Essential hypertension   4. Hyperlipidemia, unspecified hyperlipidemia type   5. OSA (obstructive sleep apnea)   6. Chronic obstructive pulmonary disease, unspecified COPD type ([place])    PLAN:    CAD - we  will try to uptitrate carvedilol to 6.25 mg BID. We will reassess at next follow up. We discussed possible need for a more selective BB if he has exacerbation of pulmonary symptoms. Continue med therapy as above. Add prn nitro.   HTN - increase carvedilol and monitor BP, well controlled today.   HLD - continue atorvastatin.   COPD - continue follow up with pulmonary med.   Total time of encounter: 30 minutes total time of encounter, including 25 minutes spent in face-to-face patient care on the date of this encounter. This time includes coordination of care and counseling regarding above mentioned problem list. Remainder of non-face-to-face time involved reviewing chart documents/testing relevant to the patient encounter and documentation in the medical record. I have independently reviewed documentation from referring provider.   Cherlynn Kaiser, MD [place]   [facility]    Medication Adjustments/Labs and Tests Ordered: Current medicines are reviewed at length with the patient today.  Concerns regarding medicines are outlined above.  No orders of the defined types were placed in this encounter.  No orders of the defined types were placed in this encounter.   Patient Instructions  Medication Instructions:   TRY TO INCREASE  OF CARVEDILOL TO 6.25 MG TWICE A DAY FOR 2 WEEKS  IF  CAN NOT TOLERATE REDUCE BACK TO 3.125 MG TWICE A DAY   *If you need a refill on your cardiac medications before your next appointment, please call your pharmacy*   Lab Work: NOT NEEDED If you have labs (blood work) drawn today and your tests are completely normal, you will receive your results only by:  [place] (if you have MyChart) OR  A paper copy in the mail If you have any lab test that is abnormal or we need to change your treatment, we will call you to review the results.   Testing/Procedures: NONE   Follow-Up: At [facility], you and your health needs are our priority.  As  part of our continuing mission to provide you with exceptional heart care, we have created designated Provider Care Teams.  These Care Teams include your primary Cardiologist (physician) and Advanced Practice Providers (APPs -  Physician Assistants and Nurse Practitioners) who all work together to provide you with the care you need, when you need it.  We recommend signing up for the patient portal called "MyChart".  Sign up information is provided on this After Visit Summary.  MyChart is used to connect with patients for Virtual Visits (Telemedicine).  Patients are able to view lab/test results, encounter notes, upcoming appointments, etc.  Non-urgent messages can be sent to your provider as well.   To learn more about what you can do with MyChart, go to [email or web address].    Your next appointment:   3 month(s)  The format for your next appointment:   In Person  Provider:   Cherlynn Kaiser, MD

## 2019-07-13 NOTE — Patient Instructions (Signed)
Medication Instructions:   TRY TO INCREASE  OF CARVEDILOL TO 6.25 MG TWICE A DAY FOR 2 WEEKS  IF CAN NOT TOLERATE REDUCE BACK TO 3.125 MG TWICE A DAY   *If you need a refill on your cardiac medications before your next appointment, please call your pharmacy*   Lab Work: NOT NEEDED If you have labs (blood work) drawn today and your tests are completely normal, you will receive your results only by:  MyChart Message (if you have MyChart) OR  A paper copy in the mail If you have any lab test that is abnormal or we need to change your treatment, we will call you to review the results.   Testing/Procedures: NONE   Follow-Up: At [facility], you and your health needs are our priority.  As part of our continuing mission to provide you with exceptional heart care, we have created designated Provider Care Teams.  These Care Teams include your primary Cardiologist (physician) and Advanced Practice Providers (APPs -  Physician Assistants and Nurse Practitioners) who all work together to provide you with the care you need, when you need it.  We recommend signing up for the patient portal called "MyChart".  Sign up information is provided on this After Visit Summary.  MyChart is used to connect with patients for Virtual Visits (Telemedicine).  Patients are able to view lab/test results, encounter notes, upcoming appointments, etc.  Non-urgent messages can be sent to your provider as well.   To learn more about what you can do with MyChart, go to [email or web address].    Your next appointment:   3 month(s)  The format for your next appointment:   In Person  Provider:   Cherlynn Kaiser, MD

## 2019-07-19 NOTE — Progress Notes (Signed)
[facility] Note  07/20/2019     CHIEF COMPLAINT Patient presents for Retina Follow Up   HISTORY OF PRESENT ILLNESS: Juan [name] is a 59 y.o. male who presents to the clinic today for:   HPI    Retina Follow Up    Patient presents with  Diabetic Retinopathy.  In both eyes.  This started months ago.  Severity is moderate.  Duration of 5 weeks.  Since onset it is stable.  I, the attending physician,  performed the HPI with the patient and updated documentation appropriately.          Comments    59 y/o male pt here for 5 wk f/u for severe NPDR w/mac edema OU.  No change in [place] OU.  Denies pain, FOL, but c/o sudden onset large black floaters OU x 2 days.  No obvious cause.  Cosopt BID OD.  BS 118 this a.m.  A1C unknown.  Had heart cath 3 wks ago.       Last edited by Bernarda Caffey, MD on 07/22/2019 12:23 AM. 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 Rutherford Guys for concern of CME s/p cataract sx LEE:  Ocular Hx- PMH-   CURRENT MEDICATIONS: Current Outpatient Medications (Ophthalmic Drugs)  Medication Sig  . ketorolac (ACULAR) 0.5 % ophthalmic solution Place 1 drop into both eyes 4 (four) times daily. (Patient not taking: Reported on 07/20/2019)  . prednisoLONE acetate (PRED FORTE) 1 % ophthalmic suspension Place 1 drop into both eyes 4 (four) times daily. (Patient not taking: Reported on 07/20/2019)   No current facility-administered medications for this  visit. (Ophthalmic Drugs)   Current Outpatient Medications (Other)  Medication Sig  . ADVAIR DISKUS 500-50 MCG/DOSE AEPB Inhale 1 puff into the lungs 2 (two) times daily.  Marland Kitchen albuterol (PROVENTIL HFA;VENTOLIN HFA) 108 (90 BASE) MCG/ACT inhaler Inhale 1-2 puffs into the lungs every 6 (six) hours as needed for wheezing or shortness of breath.   . alfuzosin (UROXATRAL) 10 MG 24 hr tablet Take 10 mg by mouth daily.  Marland Kitchen aspirin EC 81 MG tablet Take 81 mg by mouth daily.  Marland Kitchen atorvastatin (LIPITOR) 40 MG tablet Take 40 mg by mouth daily.  . carvedilol (COREG) 6.25 MG tablet Take 1 tablet (6.25 mg total) by mouth 2 (two) times daily with a meal.  . Empagliflozin-metFORMIN HCl (SYNJARDY) 12.06-998 MG TABS Take 1 tablet by mouth 2 (two) times a day.   . esomeprazole (NEXIUM) 40 MG capsule Take 40 mg by mouth daily at 12 noon.  . insulin glargine, 2 Unit Dial, (TOUJEO MAX SOLOSTAR) 300 UNIT/ML Solostar Pen Inject 40 Units into the skin daily.   Marland Kitchen lisinopril (ZESTRIL) 40 MG tablet Take 40 mg by mouth daily.  . pantoprazole (PROTONIX) 40 MG tablet Take 40 mg by mouth daily.   No current facility-administered medications for this visit. (Other)  REVIEW OF SYSTEMS: ROS    Positive for: Gastrointestinal, Musculoskeletal, Endocrine, Cardiovascular, Eyes, Respiratory   Negative for: Constitutional, Neurological, Skin, Genitourinary, HENT, Psychiatric, Allergic/Imm, Heme/Lymph   Last edited by Matthew Folks, COA on 07/20/2019  3:12 PM. (History)       ALLERGIES Allergies  Allergen Reactions  . Naproxen Shortness Of Breath  . Naproxen Sodium Shortness Of Breath  . Penicillins Rash    Has patient had a PCN reaction causing immediate rash, facial/tongue/throat swelling, SOB or lightheadedness with hypotension: Yes Has patient had a PCN reaction causing severe rash involving mucus membranes or skin necrosis: No Has patient had a PCN reaction that required hospitalization No Has patient had a PCN  reaction occurring within the last 10 years: No If all of the above answers are "NO", then may proceed with Cephalosporin use.  Has patient had a PCN reaction causing immediate rash, facial/tongue/throat swelling, SOB or lightheadedness with hypotension: Yes Has patient had a PCN reaction causing severe rash involving mucus membranes or skin necrosis: No Has patient had a PCN reaction that required hospitalization No Has patient had a PCN reaction occurring within the last 10 years: No If all of the above answers are "NO", then may proceed with Cephalosporin use.     PAST MEDICAL HISTORY Past Medical History:  Diagnosis Date  . Arthritis   . Asthma   . COPD (chronic obstructive pulmonary disease) ([place])   . Diabetes mellitus    Type 2  . Diabetic retinopathy ([place])    NPDR OU  . GERD (gastroesophageal reflux disease)   . H/O hiatal hernia   . History of kidney stones   . Hyperlipemia   . Hypertension   . Hypertensive retinopathy    OU  . Nasal polyps   . Pneumonia 2014  . Shortness of breath   . Sleep apnea    pt has CPAP but is unable to use it d/t having polyps in his nose. Pt stated "I need to call and get a CPAP mask instead"  . Stomach ulcer    Past Surgical History:  Procedure Laterality Date  . CATARACT EXTRACTION Right 07/05/2018   Dr. Gershon Crane  . CATARACT EXTRACTION Left 07/12/2018   Dr. Gershon Crane  . CHOLECYSTECTOMY N/A 01/23/2015   Procedure: LAPAROSCOPIC CHOLECYSTECTOMY ;  Surgeon: Georganna Skeans, MD;  Location: [facility];  Service: General;  Laterality: N/A;  . COLONOSCOPY W/ POLYPECTOMY    . ESOPHAGOGASTRODUODENOSCOPY    . EYE SURGERY    . KNEE ARTHROSCOPY  03/24/2011   Procedure: ARTHROSCOPY KNEE;  Surgeon: Alta Corning, MD;  Location: [facility];  Service: Orthopedics;  Laterality: Right;  partial medial menisectomy, chondroplaty patella, and medial medial plica  . LEFT HEART CATH AND CORONARY ANGIOGRAPHY N/A 06/29/2019   Procedure: LEFT HEART CATH  AND CORONARY ANGIOGRAPHY;  Surgeon: Wellington Hampshire, MD;  Location: [facility];  Service: Cardiovascular;  Laterality: N/A;    FAMILY HISTORY Family History  Problem Relation Age of Onset  . Diabetes Father     SOCIAL HISTORY Social History   Tobacco Use  . Smoking status: Former Smoker    Years: 20.00    Types: Cigarettes  . Smokeless tobacco: Current User    Types: Chew  . Tobacco comment: 1 can/daily  Substance Use Topics  . Alcohol use: Yes    Comment: social, 1x weekly  . Drug use: No         OPHTHALMIC EXAM:  Base Eye  Exam    Visual Acuity (Snellen - Linear)      Right Left   Dist [place] 20/30 20/70 -2   Dist ph [place] 20/20 -2 NI       Tonometry (Tonopen, 3:15 PM)      Right Left   Pressure 14 15       Pupils      Dark Light Shape React APD   Right 3 2 Round Brisk None   Left 3 2 Round Brisk None       Visual Fields (Counting fingers)      Left Right    Full Full       Extraocular Movement      Right Left    Full, Ortho Full, Ortho       Neuro/Psych    Oriented x3: Yes   Mood/Affect: Normal       Dilation    Both eyes: 1.0% Mydriacyl, 2.5% Phenylephrine @ 3:15 PM        Slit Lamp and Fundus Exam    Slit Lamp Exam      Right Left   Lids/Lashes Dermatochalasis - upper lid, Telangiectasia, mild Meibomian gland dysfunction Dermatochalasis - upper lid, Telangiectasia, mild Meibomian gland dysfunction   Conjunctiva/Sclera White and quiet, +concretions on palpebral conj Mild nasal Pinguecula, +concretions on palpebral conj   Cornea Mild Arcus, Well healed temporal cataract wounds Arcus, 1+ Descemet's folds, Well healed temporal cataract wounds   Anterior Chamber deep, 1+ cell/pigment deep, 1+ cell/pigment   Iris Round and dilated, mild patch of atrophy at 0800, no NVI Round and dilated, No NVI   Lens PC IOL in good position PC IOL in good position   Vitreous Vitreous syneresis Vitreous syneresis       Fundus Exam      Right Left    Disc Pink and Sharp Pallor, Sharp rim   C/D Ratio 0.4 0.3   Macula Blunted foveal reflex, +cystic edema / IRF -- persistent, but slightly improved, scattered MA and exudate temporal mac Blunted foveal reflex, +central edema -- improving, scattered DBH and MAs, scattered puncatate exudates greatest superiorly - improving   Vessels Vascular attenuation, Tortuous Vascular attenuation, Tortuous   Periphery Attached, scattered MA Attached, pigmented choroidal nevus superiorly with mild elevation, +drusen, no SRF          IMAGING AND PROCEDURES  Imaging and Procedures for _0 @  OCT, Retina - OU - Both Eyes       Right Eye Quality was good. Central Foveal Thickness: 327. Progression has been stable. Findings include abnormal foveal contour, intraretinal hyper-reflective material, intraretinal fluid, outer retinal atrophy, no SRF (persistent IRF).   Left Eye Quality was good. Central Foveal Thickness: 387. Progression has improved. Findings include intraretinal fluid, intraretinal hyper-reflective material, abnormal foveal contour, no SRF (Mild interval improvement in IRF/IRHM , Sub-RPE hyper-reflective mass consistent with choroidal nevus--superior to disc, caught on widefield -- stable from prior).   Notes *Images captured and stored on drive  Diagnosis / Impression: Severe DME OU OD: persistent IRF OS: mild interval improvement in IRF/IRHM, Sub-RPE hyper-reflective mass consistent with choroidal nevus--superior to disc, caught on widefield -- stable from prior   Clinical management:  See below  Abbreviations: NFP - Normal foveal profile. CME - cystoid macular edema. PED - pigment epithelial detachment. IRF - intraretinal fluid. SRF - subretinal fluid. EZ - ellipsoid zone. ERM - epiretinal membrane. ORA - outer retinal atrophy. ORT - outer retinal tubulation. SRHM - subretinal hyper-reflective material  Intravitreal Injection, Pharmacologic Agent - OD - Right Eye        Time Out 07/20/2019. 3:57 PM. Confirmed correct patient, procedure, site, and patient consented.   Anesthesia Topical anesthesia was used. Anesthetic medications included Lidocaine 2%, Proparacaine 0.5%.   Procedure Preparation included eyelid speculum, 5% betadine to ocular surface. A (32g) needle was used.   Injection:  2 mg aflibercept Alfonse Flavors) SOLN   NDC: A3590391, Lot: [id number], Expiration date: 10/28/2019   Route: Intravitreal, Site: Right Eye, Waste: 0.05 mL  Post-op Post injection exam found visual acuity of at least counting fingers. The patient tolerated the procedure well. There were no complications. The patient received written and verbal post procedure care education. Post injection medications were not given.        Intravitreal Injection, Pharmacologic Agent - OS - Left Eye       Time Out 07/20/2019. 3:57 PM. Confirmed correct patient, procedure, site, and patient consented.   Anesthesia Topical anesthesia was used. Anesthetic medications included Lidocaine 2%, Proparacaine 0.5%.   Procedure Preparation included 5% betadine to ocular surface, eyelid speculum. A (32g) needle was used.   Injection:  2 mg aflibercept Alfonse Flavors) SOLN   NDC: M7179715, Lot: [id number], Expiration date: 10/29/2019   Route: Intravitreal, Site: Left Eye, Waste: 0.05 mL  Post-op Post injection exam found visual acuity of at least counting fingers. The patient tolerated the procedure well. There were no complications. The patient received written and verbal post procedure care education.                 ASSESSMENT/PLAN:    ICD-10-CM   1. Severe nonproliferative diabetic retinopathy of both eyes with macular edema associated with type 2 diabetes mellitus (HCC)  Q11.9417 Intravitreal Injection, Pharmacologic Agent - OD - Right Eye    Intravitreal Injection, Pharmacologic Agent - OS - Left Eye    aflibercept (EYLEA) SOLN 2 mg    aflibercept (EYLEA) SOLN 2 mg  2.  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 Essential hypertension  I10   5. Hypertensive retinopathy of both eyes  H35.033   6. Pseudophakia of both eyes  Z96.1   7. Ocular hypertension of right eye  H40.051     1. Severe nonproliferative diabetic retinopathy with DME, OU (OS>OD)  - s/p IVA OD #1 (06.26.20), #2 (08.14.20), #3 (09.17.20), #4 (10.16.20)  - s/p IVA OS #1 (07.17.20), #2 (08.14.20), #3 (09.17.20)  - s/p IVA OU (08.14.20), #3 (09.17.20)  - s/p IVE OS #1 (10.16.20) - sample, #2 (11.13.20), #3 (12.11.20), #4 (01.18.21), #5 (02.19.21), #6 (03.19.21), #7 (04.16.21), #8 (05.14.21)  - s/p IVE OD #1 (11.13.20), #2 (12.11.20), #3 (01.18.21), #4 (02.19.21), #5 (03.19.21), #6 (04.16.21), #7 (05.14.21)  - FA (06.26.20) shows Severe NPDR OU w/ Late leaking MA OU, Enlarged FAZ OU, No NV OU, No significant hyperfluorescence of disc  - BCVA: OD 20/20 - stable; OS stable at 20/70  - OCT shows persistent IRF OD; mild improvement in  IRF/IRHM OS  - recommend IVE OD #8 and IVE #9 OS today, 06.17.21  - pt wishes to proceed  - RBA of procedure discussed, questions answered  - informed consent obtained  - Eylea informed consent form signed and scanned on 01.18.2021  - see procedure note  - [email or web address] Benefits Investigation initiated 10.16.2020 -- approved for 2021  - f/u in 4 weeks -- DFE/OCT/possible injection OU  2. Retinal Edema  OU  - based on FA, suspect majority of macular edema due to DM as above, but there may be some edema secondary to post-op CME  - cont PF qid OU and ketorolac qid OU   - history of difficulty with compliance at work  - monitor  - f/u 4 wks  3. Choroidal Nevus OS  - located at 1200, mild elevation, +drusen, no SRF  - baseline optos pictures obtained, 06.26.20  - discussed possible referral to ocular oncologist at [facility] for evaluation/management  4,5.Hypertensive retinopathy OU  - discussed importance of  tight BP control  - monitor  6. Pseudophakia OU  - s/p CE/IOL OU (OD on 06.03.20 and OS 06.10.20 by Dr. Gershon Crane)  - beautiful surgeries w/ IOLs in excellent position  - macular edema limiting vision as above  - monitor  7. Ocular hypertension OD  - IOP 16 OD  - cont Cosopt BID OD  - monitor   Ophthalmic Meds Ordered this visit:  Meds ordered this encounter  Medications  . aflibercept (EYLEA) SOLN 2 mg  . aflibercept (EYLEA) SOLN 2 mg      Return for f/u 4-5 weeks, NPDR OU, DFE, OCT.  There are no Patient Instructions on file for this visit.   Explained the diagnoses, plan, and follow up with the patient and they expressed understanding.  Patient expressed understanding of the importance of proper follow up care.   This document serves as a record of services personally performed by Gardiner Sleeper, MD, PhD. It was created on their behalf by Ernest Mallick, OA, an ophthalmic assistant. The creation of this record is the provider's dictation and/or activities during the visit.    Electronically signed by: Ernest Mallick, OA 06.17.2021 12:40 AM  Gardiner Sleeper, M.D., Ph.D. Diseases & Surgery of the Retina and Vitreous [facility]  I have reviewed the above documentation for accuracy and completeness, and I agree with the above. Gardiner Sleeper, M.D., Ph.D. 07/22/19 12:40 AM   Abbreviations: M myopia (nearsighted); A astigmatism; H hyperopia (farsighted); P presbyopia; Mrx spectacle prescription;  CTL contact lenses; OD right eye; OS left eye; OU both eyes  XT exotropia; ET esotropia; PEK punctate epithelial keratitis; PEE punctate epithelial erosions; DES dry eye syndrome; MGD meibomian gland dysfunction; ATs artificial tears; PFAT's preservative free artificial tears; [place] nuclear sclerotic cataract; PSC posterior subcapsular cataract; ERM epi-retinal membrane; PVD posterior vitreous detachment; RD retinal detachment; DM diabetes mellitus; DR diabetic  retinopathy; NPDR non-proliferative diabetic retinopathy; PDR proliferative diabetic retinopathy; CSME clinically significant macular edema; DME diabetic macular edema; dbh dot blot hemorrhages; CWS cotton wool spot; POAG primary open angle glaucoma; C/D cup-to-disc ratio; HVF humphrey visual field; GVF goldmann visual field; OCT optical coherence tomography; IOP intraocular pressure; BRVO Branch retinal vein occlusion; CRVO central retinal vein occlusion; CRAO central retinal artery occlusion; BRAO branch retinal artery occlusion; RT retinal tear; SB scleral buckle; PPV pars plana vitrectomy; VH Vitreous hemorrhage; PRP panretinal laser photocoagulation; IVK intravitreal kenalog; VMT vitreomacular traction; MH Macular hole;  NVD neovascularization of the disc; NVE neovascularization elsewhere; AREDS age related eye disease study; ARMD age related macular degeneration; POAG primary open angle glaucoma; EBMD epithelial/anterior basement membrane dystrophy; ACIOL anterior chamber intraocular lens; IOL intraocular lens; PCIOL posterior chamber intraocular lens; Phaco/IOL phacoemulsification with intraocular lens placement; [place] photorefractive keratectomy; LASIK laser assisted in situ keratomileusis; HTN hypertension; DM diabetes mellitus; COPD chronic obstructive pulmonary disease

## 2019-07-20 ENCOUNTER — Encounter (INDEPENDENT_AMBULATORY_CARE_PROVIDER_SITE_OTHER): Payer: Self-pay | Admitting: Ophthalmology

## 2019-07-20 ENCOUNTER — Other Ambulatory Visit (HOSPITAL_COMMUNITY): Payer: BC Managed Care – PPO

## 2019-07-20 ENCOUNTER — Ambulatory Visit (INDEPENDENT_AMBULATORY_CARE_PROVIDER_SITE_OTHER): Payer: BC Managed Care – PPO | Admitting: Ophthalmology

## 2019-07-20 ENCOUNTER — Other Ambulatory Visit: Payer: Self-pay | Admitting: Internal Medicine

## 2019-07-20 ENCOUNTER — Other Ambulatory Visit: Payer: Self-pay

## 2019-07-20 ENCOUNTER — Other Ambulatory Visit: Payer: Self-pay | Admitting: Urology

## 2019-07-20 DIAGNOSIS — E113413 Type 2 diabetes mellitus with severe nonproliferative diabetic retinopathy with macular edema, bilateral: Secondary | ICD-10-CM | POA: Diagnosis not present

## 2019-07-20 DIAGNOSIS — H3581 Retinal edema: Secondary | ICD-10-CM

## 2019-07-20 DIAGNOSIS — H35033 Hypertensive retinopathy, bilateral: Secondary | ICD-10-CM

## 2019-07-20 DIAGNOSIS — Z961 Presence of intraocular lens: Secondary | ICD-10-CM

## 2019-07-20 DIAGNOSIS — I10 Essential (primary) hypertension: Secondary | ICD-10-CM

## 2019-07-20 DIAGNOSIS — H40051 Ocular hypertension, right eye: Secondary | ICD-10-CM

## 2019-07-20 DIAGNOSIS — D3132 Benign neoplasm of left choroid: Secondary | ICD-10-CM

## 2019-07-20 MED ORDER — CARVEDILOL 6.25 MG PO TABS: 6.2500 mg | ORAL_TABLET | Freq: Two times a day (BID) | ORAL | 6 refills | Status: DC

## 2019-07-20 NOTE — Telephone Encounter (Signed)
*  STAT* If patient is at the pharmacy, call can be transferred to refill team.   1. Which medications need to be refilled? (please list name of each medication and dose if known) carvedilol (COREG) 6.25 MG tablet  2. Which pharmacy/location (including street and city if local pharmacy) is medication to be sent to? [id number] - [place], [place] - [address].  3. Do they need a 30 day or 90 day supply? 30 day supply  Patient states he is completely out of medication.

## 2019-07-22 ENCOUNTER — Encounter (INDEPENDENT_AMBULATORY_CARE_PROVIDER_SITE_OTHER): Payer: Self-pay | Admitting: Ophthalmology

## 2019-07-22 MED ORDER — AFLIBERCEPT 2MG/0.05ML IZ SOLN FOR KALEIDOSCOPE
2.0000 mg | INTRAVITREAL | Status: AC | PRN
  Administered 2019-07-22: 2 mg via INTRAVITREAL

## 2019-07-23 ENCOUNTER — Ambulatory Visit: Payer: BC Managed Care – PPO | Admitting: Adult Health

## 2019-07-23 ENCOUNTER — Other Ambulatory Visit: Payer: Self-pay

## 2019-07-24 ENCOUNTER — Ambulatory Visit: Payer: BC Managed Care – PPO | Admitting: Primary Care

## 2019-08-13 NOTE — Progress Notes (Signed)
[facility] Note  08/17/2019     CHIEF COMPLAINT Patient presents for Retina Follow Up   HISTORY OF PRESENT ILLNESS: Juan [name] is a 59 y.o. male who presents to the clinic today for:   HPI    Retina Follow Up    Patient presents with  Diabetic Retinopathy.  In both eyes.  Severity is moderate.  Duration of 4 weeks.  Since onset it is stable.  I, the attending physician,  performed the HPI with the patient and updated documentation appropriately.          Comments    Patient states vision the same OU. BS was 120 this am. Last a1c was around 8.1, checked about a year ago. Taking Pred Forte and ketorolac bid OU. Using cosopt bid OU, although notes say to use cosopt OD only.       Last edited by Bernarda Caffey, MD on 08/17/2019  9:38 PM. (History)    Patient    Referring physician: Harlan Stains, MD [place],  [place] [id number]  HISTORICAL INFORMATION:   Selected notes from the MEDICAL RECORD NUMBER Referred by Dr. Rutherford Guys for concern of CME s/p cataract sx LEE:  Ocular Hx- PMH-   CURRENT MEDICATIONS: Current Outpatient Medications (Ophthalmic Drugs)  Medication Sig  . dorzolamide-timolol (COSOPT) 22.3-6.8 MG/ML ophthalmic solution Place 1 drop into both eyes 2 (two) times daily.  Marland Kitchen ketorolac (ACULAR) 0.5 % ophthalmic solution Place 1 drop into both eyes 4 (four) times daily. (Patient taking differently: Place 1 drop into both eyes in the morning and at bedtime. )  . prednisoLONE acetate (PRED FORTE) 1 % ophthalmic suspension Place 1 drop into both eyes 4 (four) times daily.   No current facility-administered medications for this visit. (Ophthalmic Drugs)   Current Outpatient Medications (Other)  Medication Sig  . ADVAIR DISKUS 500-50 MCG/DOSE AEPB Inhale 1 puff into the lungs 2 (two) times daily.  Marland Kitchen albuterol (PROVENTIL HFA;VENTOLIN HFA) 108 (90 BASE) MCG/ACT inhaler Inhale 1-2 puffs into the lungs every  6 (six) hours as needed for wheezing or shortness of breath.   . alfuzosin (UROXATRAL) 10 MG 24 hr tablet TAKE 1 TABLET BY MOUTH TWICE A DAY  . alfuzosin (UROXATRAL) 10 MG 24 hr tablet Take 10 mg by mouth daily.  Marland Kitchen aspirin EC 81 MG tablet Take 81 mg by mouth daily.  Marland Kitchen atorvastatin (LIPITOR) 40 MG tablet Take 40 mg by mouth daily.  . carvedilol (COREG) 6.25 MG tablet Take 1 tablet (6.25 mg total) by mouth 2 (two) times daily with a meal.  . Empagliflozin-metFORMIN HCl (SYNJARDY) 12.06-998 MG TABS Take 1 tablet by mouth 2 (two) times a day.   . esomeprazole (NEXIUM) 40 MG capsule Take 40 mg by mouth daily at 12 noon.  . insulin glargine, 2 Unit Dial, (TOUJEO MAX SOLOSTAR) 300 UNIT/ML Solostar Pen Inject 40 Units into the skin daily.   Marland Kitchen lisinopril (ZESTRIL) 40 MG tablet Take 40 mg by mouth daily.  . pantoprazole (PROTONIX) 40 MG tablet Take 40 mg by mouth daily.   No current facility-administered medications for this visit. (Other)      REVIEW OF SYSTEMS: ROS    Positive for: Gastrointestinal, Musculoskeletal, Endocrine, Cardiovascular, Eyes, Respiratory   Negative for: Constitutional, Neurological, Skin, Genitourinary, HENT, Psychiatric, Allergic/Imm, Heme/Lymph   Last edited by Roselee Nova D, COT on 08/17/2019  2:59 PM. (History)       ALLERGIES Allergies  Allergen Reactions  . Naproxen Shortness Of Breath  . Naproxen Sodium Shortness Of Breath  . Penicillins Rash    Has patient had a PCN reaction causing immediate rash, facial/tongue/throat swelling, SOB or lightheadedness with hypotension: Yes Has patient had a PCN reaction causing severe rash involving mucus membranes or skin necrosis: No Has patient had a PCN reaction that required hospitalization No Has patient had a PCN reaction occurring within the last 10 years: No If all of the above answers are "NO", then may proceed with Cephalosporin use.  Has patient had a PCN reaction causing immediate rash, facial/tongue/throat  swelling, SOB or lightheadedness with hypotension: Yes Has patient had a PCN reaction causing severe rash involving mucus membranes or skin necrosis: No Has patient had a PCN reaction that required hospitalization No Has patient had a PCN reaction occurring within the last 10 years: No If all of the above answers are "NO", then may proceed with Cephalosporin use.     PAST MEDICAL HISTORY Past Medical History:  Diagnosis Date  . Arthritis   . Asthma   . COPD (chronic obstructive pulmonary disease) ([facility])   . Diabetes mellitus    Type 2  . Diabetic retinopathy ([place])    NPDR OU  . GERD (gastroesophageal reflux disease)   . H/O hiatal hernia   . History of kidney stones   . Hyperlipemia   . Hypertension   . Hypertensive retinopathy    OU  . Nasal polyps   . Pneumonia 2014  . Shortness of breath   . Sleep apnea    pt has CPAP but is unable to use it d/t having polyps in his nose. Pt stated "I need to call and get a CPAP mask instead"  . Stomach ulcer    Past Surgical History:  Procedure Laterality Date  . CATARACT EXTRACTION Right 07/05/2018   Dr. Gershon Crane  . CATARACT EXTRACTION Left 07/12/2018   Dr. Gershon Crane  . CHOLECYSTECTOMY N/A 01/23/2015   Procedure: LAPAROSCOPIC CHOLECYSTECTOMY ;  Surgeon: Georganna Skeans, MD;  Location: [facility];  Service: General;  Laterality: N/A;  . COLONOSCOPY W/ POLYPECTOMY    . ESOPHAGOGASTRODUODENOSCOPY    . EYE SURGERY    . KNEE ARTHROSCOPY  03/24/2011   Procedure: ARTHROSCOPY KNEE;  Surgeon: Alta Corning, MD;  Location: [facility];  Service: Orthopedics;  Laterality: Right;  partial medial menisectomy, chondroplaty patella, and medial medial plica  . LEFT HEART CATH AND CORONARY ANGIOGRAPHY N/A 06/29/2019   Procedure: LEFT HEART CATH AND CORONARY ANGIOGRAPHY;  Surgeon: Wellington Hampshire, MD;  Location: [facility];  Service: Cardiovascular;  Laterality: N/A;    FAMILY HISTORY Family History  Problem Relation Age of Onset  .  Diabetes Father     SOCIAL HISTORY Social History   Tobacco Use  . Smoking status: Former Smoker    Years: 20.00    Types: Cigarettes  . Smokeless tobacco: Current User    Types: Chew  . Tobacco comment: 1 can/daily  Substance Use Topics  . Alcohol use: Yes    Comment: social, 1x weekly  . Drug use: No         OPHTHALMIC EXAM:  Base Eye Exam    Visual Acuity (Snellen - Linear)      Right Left   Dist [place] 20/25 20/60 +1   Dist ph [place] 20/25 +1 20/60 +2       Tonometry (Tonopen, 3:11 PM)      Right Left  Pressure 14 12       Pupils      Dark Light Shape React APD   Right 3 2 Round Brisk None   Left 3 2 Round Brisk None       Visual Fields (Counting fingers)      Left Right    Full Full       Extraocular Movement      Right Left    Full, Ortho Full, Ortho       Neuro/Psych    Oriented x3: Yes   Mood/Affect: Normal       Dilation    Both eyes: 1.0% Mydriacyl, 2.5% Phenylephrine @ 3:11 PM        Slit Lamp and Fundus Exam    Slit Lamp Exam      Right Left   Lids/Lashes Dermatochalasis - upper lid, Telangiectasia, mild Meibomian gland dysfunction Dermatochalasis - upper lid, Telangiectasia, mild Meibomian gland dysfunction   Conjunctiva/Sclera White and quiet, +concretions on palpebral conj Mild nasal Pinguecula, +concretions on palpebral conj   Cornea Mild Arcus, Well healed temporal cataract wounds Arcus, 1+ Descemet's folds, Well healed temporal cataract wounds   Anterior Chamber deep, 1+ cell/pigment deep, 1+ cell/pigment   Iris Round and dilated, mild patch of atrophy at 0800, no NVI Round and dilated, No NVI   Lens PC IOL in good position PC IOL in good position   Vitreous Vitreous syneresis Vitreous syneresis       Fundus Exam      Right Left   Disc Pink and Sharp Pallor, Sharp rim   C/D Ratio 0.4 0.3   Macula Blunted foveal reflex, +cystic edema / IRF -- persistent, but slightly improved, scattered MA and exudate Blunted foveal reflex,  +central edema -- improving, scattered DBH and MAs, scattered puncatate exudates - improving   Vessels Vascular attenuation, Tortuous Vascular attenuation, Tortuous   Periphery Attached, scattered MA Attached, pigmented choroidal nevus superiorly with mild elevation, +drusen, no SRF          IMAGING AND PROCEDURES  Imaging and Procedures for @TODAY @  OCT, Retina - OU - Both Eyes       Right Eye Quality was good. Central Foveal Thickness: 316. Progression has improved. Findings include abnormal foveal contour, intraretinal hyper-reflective material, intraretinal fluid, outer retinal atrophy, no SRF (Mild interval improvement in cystic changes).   Left Eye Quality was good. Central Foveal Thickness: 350. Progression has improved. Findings include intraretinal fluid, intraretinal hyper-reflective material, abnormal foveal contour, no SRF (Mild interval improvement in IRF/IRHM , Sub-RPE hyper-reflective mass consistent with choroidal nevus--superior to disc, caught on widefield -- stable from prior).   Notes *Images captured and stored on drive  Diagnosis / Impression: Severe DME OU OD: mild interval improvement in cystic changes OS: mild interval improvement in IRF/IRHM, Sub-RPE hyper-reflective mass consistent with choroidal nevus--superior to disc, caught on widefield -- stable from prior   Clinical management:  See below  Abbreviations: NFP - Normal foveal profile. CME - cystoid macular edema. PED - pigment epithelial detachment. IRF - intraretinal fluid. SRF - subretinal fluid. EZ - ellipsoid zone. ERM - epiretinal membrane. ORA - outer retinal atrophy. ORT - outer retinal tubulation. SRHM - subretinal hyper-reflective material        Intravitreal Injection, Pharmacologic Agent - OD - Right Eye       Time Out 08/17/2019. 3:34 PM. Confirmed correct patient, procedure, site, and patient consented.   Anesthesia Topical anesthesia was used. Anesthetic medications included  Lidocaine 2%,  Proparacaine 0.5%.   Procedure Preparation included eyelid speculum, 5% betadine to ocular surface. A (32g) needle was used.   Injection:  2 mg aflibercept Alfonse Flavors) SOLN   NDC: A3590391, Lot: [id number], Expiration date: 12/02/2019   Route: Intravitreal, Site: Right Eye, Waste: 0.05 mL  Post-op Post injection exam found visual acuity of at least counting fingers. The patient tolerated the procedure well. There were no complications. The patient received written and verbal post procedure care education.        Intravitreal Injection, Pharmacologic Agent - OS - Left Eye       Time Out 08/17/2019. 3:34 PM. Confirmed correct patient, procedure, site, and patient consented.   Anesthesia Topical anesthesia was used. Anesthetic medications included Lidocaine 2%, Proparacaine 0.5%.   Procedure Preparation included 5% betadine to ocular surface, eyelid speculum. A (32g) needle was used.   Injection:  2 mg aflibercept Alfonse Flavors) SOLN   NDC: M7179715, Lot: [id number], Expiration date: 12/02/2019   Route: Intravitreal, Site: Left Eye, Waste: 0.05 mL  Post-op Post injection exam found visual acuity of at least counting fingers. The patient tolerated the procedure well. There were no complications. The patient received written and verbal post procedure care education.                 ASSESSMENT/PLAN:    ICD-10-CM   1. Severe nonproliferative diabetic retinopathy of both eyes with macular edema associated with type 2 diabetes mellitus (HCC)  Z85.8850 Intravitreal Injection, Pharmacologic Agent - OD - Right Eye    Intravitreal Injection, Pharmacologic Agent - OS - Left Eye    aflibercept (EYLEA) SOLN 2 mg    aflibercept (EYLEA) SOLN 2 mg  2. 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 Essential hypertension  I10   5. Hypertensive retinopathy of both eyes  H35.033   6. Pseudophakia of both eyes  Z96.1   7.  Ocular hypertension of right eye  H40.051     1. Severe nonproliferative diabetic retinopathy with DME, OU (OS>OD)  - s/p IVA OD #1 (06.26.20), #2 (08.14.20), #3 (09.17.20), #4 (10.16.20)  - s/p IVA OS #1 (07.17.20), #2 (08.14.20), #3 (09.17.20)  - s/p IVA OU (08.14.20), #3 (09.17.20)  - s/p IVE OS #1 (10.16.20) - sample, #2 (11.13.20), #3 (12.11.20), #4 (01.18.21), #5 (02.19.21), #6 (03.19.21), #7 (04.16.21), #8 (05.14.21), #9 (06.17.21)  - s/p IVE OD #1 (11.13.20), #2 (12.11.20), #3 (01.18.21), #4 (02.19.21), #5 (03.19.21), #6 (04.16.21), #7 (05.14.21), #8 (06.17.21)  - FA (06.26.20) shows Severe NPDR OU w/ Late leaking MA OU, Enlarged FAZ OU, No NV OU, No significant hyperfluorescence of disc  - BCVA: OD 20/20 - stable; OS improved to 20/60  - OCT shows mild interval improvement in cystic changes OD; mild improvement in  IRF/IRHM OS  - recommend IVE OD #9 and IVE #10 OS today, 06.17.21  - pt wishes to proceed  - RBA of procedure discussed, questions answered  - informed consent obtained  - Eylea informed consent form signed and scanned on 01.18.2021  - see procedure note  - Eylea4U Benefits Investigation initiated 10.16.2020 -- approved for 2021  - f/u in 4 weeks -- DFE/OCT/possible injection OU  2. Retinal Edema OU  - based on FA, suspect majority of macular edema due to DM as above, but there may be some edema secondary to post-op CME  - cont PF qid OU and ketorolac qid OU   -  history of difficulty with compliance at work  - monitor  - f/u 4 wks  3. Choroidal Nevus OS  - located at 1200, mild elevation, +drusen, no SRF  - baseline optos pictures obtained, 06.26.20  - discussed possible referral [facility] oncologist at [facility] for evaluation/management  4,5.Hypertensive retinopathy OU  - discussed importance of tight BP control  - monitor  6. Pseudophakia OU  - s/p CE/IOL OU (OD on 06.03.20 and OS 06.10.20 by Dr. Gershon Crane)  - beautiful surgeries w/ IOLs in excellent  position  - macular edema limiting vision as above  - monitor  7. Ocular hypertension OD  - IOP 14 OD  - cont Cosopt BID OD  - monitor   Ophthalmic Meds Ordered this visit:  Meds ordered this encounter  Medications  . aflibercept (EYLEA) SOLN 2 mg  . aflibercept (EYLEA) SOLN 2 mg      Return in about 4 weeks (around 09/14/2019) for f/u NPDR OU, DFE, OCT.  There are no Patient Instructions on file for this visit.   Explained the diagnoses, plan, and follow up with the patient and they expressed understanding.  Patient expressed understanding of the importance of proper follow up care.   This document serves as a record of services personally performed by Gardiner Sleeper, MD, PhD. It was created on their behalf by San Jetty. Owens Shark, OA an ophthalmic technician. The creation of this record is the provider's dictation and/or activities during the visit.    Electronically signed by: San Jetty. Owens Shark, [place] 07.12.2021 11:17 AM  Gardiner Sleeper, M.D., Ph.D. Diseases & Surgery of the Retina and Vitreous [facility]  I have reviewed the above documentation for accuracy and completeness, and I agree with the above. Gardiner Sleeper, M.D., Ph.D. 08/18/19 11:17 AM   Abbreviations: M myopia (nearsighted); A astigmatism; H hyperopia (farsighted); P presbyopia; Mrx spectacle prescription;  CTL contact lenses; OD right eye; OS left eye; OU both eyes  XT exotropia; ET esotropia; PEK punctate epithelial keratitis; PEE punctate epithelial erosions; DES dry eye syndrome; MGD meibomian gland dysfunction; ATs artificial tears; PFAT's preservative free artificial tears; [place] nuclear sclerotic cataract; PSC posterior subcapsular cataract; ERM epi-retinal membrane; PVD posterior vitreous detachment; RD retinal detachment; DM diabetes mellitus; DR diabetic retinopathy; NPDR non-proliferative diabetic retinopathy; PDR proliferative diabetic retinopathy; CSME clinically significant macular  edema; DME diabetic macular edema; dbh dot blot hemorrhages; CWS cotton wool spot; POAG primary open angle glaucoma; C/D cup-to-disc ratio; HVF humphrey visual field; GVF goldmann visual field; OCT optical coherence tomography; IOP intraocular pressure; BRVO Branch retinal vein occlusion; CRVO central retinal vein occlusion; CRAO central retinal artery occlusion; BRAO branch retinal artery occlusion; RT retinal tear; SB scleral buckle; PPV pars plana vitrectomy; VH Vitreous hemorrhage; PRP panretinal laser photocoagulation; IVK intravitreal kenalog; VMT vitreomacular traction; MH Macular hole;  NVD neovascularization of the disc; NVE neovascularization elsewhere; AREDS age related eye disease study; ARMD age related macular degeneration; POAG primary open angle glaucoma; EBMD epithelial/anterior basement membrane dystrophy; ACIOL anterior chamber intraocular lens; IOL intraocular lens; PCIOL posterior chamber intraocular lens; Phaco/IOL phacoemulsification with intraocular lens placement; [place] photorefractive keratectomy; LASIK laser assisted in situ keratomileusis; HTN hypertension; DM diabetes mellitus; COPD chronic obstructive pulmonary disease

## 2019-08-17 ENCOUNTER — Encounter (INDEPENDENT_AMBULATORY_CARE_PROVIDER_SITE_OTHER): Payer: Self-pay | Admitting: Ophthalmology

## 2019-08-17 ENCOUNTER — Other Ambulatory Visit: Payer: Self-pay

## 2019-08-17 ENCOUNTER — Ambulatory Visit (INDEPENDENT_AMBULATORY_CARE_PROVIDER_SITE_OTHER): Payer: BC Managed Care – PPO | Admitting: Ophthalmology

## 2019-08-17 DIAGNOSIS — H40051 Ocular hypertension, right eye: Secondary | ICD-10-CM

## 2019-08-17 DIAGNOSIS — D3132 Benign neoplasm of left choroid: Secondary | ICD-10-CM | POA: Diagnosis not present

## 2019-08-17 DIAGNOSIS — H3581 Retinal edema: Secondary | ICD-10-CM

## 2019-08-17 DIAGNOSIS — E113413 Type 2 diabetes mellitus with severe nonproliferative diabetic retinopathy with macular edema, bilateral: Secondary | ICD-10-CM

## 2019-08-17 DIAGNOSIS — H35033 Hypertensive retinopathy, bilateral: Secondary | ICD-10-CM

## 2019-08-17 DIAGNOSIS — Z961 Presence of intraocular lens: Secondary | ICD-10-CM

## 2019-08-17 DIAGNOSIS — I10 Essential (primary) hypertension: Secondary | ICD-10-CM

## 2019-08-18 MED ORDER — AFLIBERCEPT 2MG/0.05ML IZ SOLN FOR KALEIDOSCOPE
2.0000 mg | INTRAVITREAL | Status: AC | PRN
  Administered 2019-08-18: 2 mg via INTRAVITREAL

## 2019-08-30 NOTE — Progress Notes (Deleted)
@Patient  ID: Juan [name], male    DOB: 10-05-1960, 59 y.o.   MRN: [id number]  No chief complaint on file.   Referring provider: Harlan Stains, MD  HPI: 59 year old male, former smoker. PMH significant for HTN, CAP, DM2, abnormal stress test, dyslipidemia, obesity, Patient of Dr. Ander Slade, seen for initial consult on 04/19/19 for shortness of breath. Ordered for PFTs, scheduled for CT chest in April 2021.   08/31/2019 Patient presents today for 4 month follow-up with PFTs.      TESTING: 05/01/19 CT chest- Emphysema. Resolution of the ground-glass nodularity seen previously. No pulmonary nodules on today's study. No acute intrathoracic process.   Allergies  Allergen Reactions  . Naproxen Shortness Of Breath  . Naproxen Sodium Shortness Of Breath  . Penicillins Rash    Has patient had a PCN reaction causing immediate rash, facial/tongue/throat swelling, SOB or lightheadedness with hypotension: Yes Has patient had a PCN reaction causing severe rash involving mucus membranes or skin necrosis: No Has patient had a PCN reaction that required hospitalization No Has patient had a PCN reaction occurring within the last 10 years: No If all of the above answers are "NO", then may proceed with Cephalosporin use.  Has patient had a PCN reaction causing immediate rash, facial/tongue/throat swelling, SOB or lightheadedness with hypotension: Yes Has patient had a PCN reaction causing severe rash involving mucus membranes or skin necrosis: No Has patient had a PCN reaction that required hospitalization No Has patient had a PCN reaction occurring within the last 10 years: No If all of the above answers are "NO", then may proceed with Cephalosporin use.     Immunization History  Administered Date(s) Administered  . PFIZER SARS-COV-2 Vaccination 04/18/2019    Past Medical History:  Diagnosis Date  . Arthritis   . Asthma   . COPD (chronic obstructive pulmonary disease) ([place])   .  Diabetes mellitus    Type 2  . Diabetic retinopathy ([place])    NPDR OU  . GERD (gastroesophageal reflux disease)   . H/O hiatal hernia   . History of kidney stones   . Hyperlipemia   . Hypertension   . Hypertensive retinopathy    OU  . Nasal polyps   . Pneumonia 2014  . Shortness of breath   . Sleep apnea    pt has CPAP but is unable to use it d/t having polyps in his nose. Pt stated "I need to call and get a CPAP mask instead"  . Stomach ulcer     Tobacco History: Social History   Tobacco Use  Smoking Status Former Smoker  . Years: 20.00  . Types: Cigarettes  Smokeless Tobacco Current User  . Types: Chew  Tobacco Comment   1 can/daily   Ready to quit: Not Answered Counseling given: Not Answered Comment: 1 can/daily   Outpatient Medications Prior to Visit  Medication Sig Dispense Refill  . ADVAIR DISKUS 500-50 MCG/DOSE AEPB Inhale 1 puff into the lungs 2 (two) times daily.    Marland Kitchen albuterol (PROVENTIL HFA;VENTOLIN HFA) 108 (90 BASE) MCG/ACT inhaler Inhale 1-2 puffs into the lungs every 6 (six) hours as needed for wheezing or shortness of breath.     . alfuzosin (UROXATRAL) 10 MG 24 hr tablet TAKE 1 TABLET BY MOUTH TWICE A DAY 60 tablet 11  . alfuzosin (UROXATRAL) 10 MG 24 hr tablet Take 10 mg by mouth daily.    Marland Kitchen aspirin EC 81 MG tablet Take 81 mg by mouth daily.    Marland Kitchen  atorvastatin (LIPITOR) 40 MG tablet Take 40 mg by mouth daily.    . carvedilol (COREG) 6.25 MG tablet Take 1 tablet (6.25 mg total) by mouth 2 (two) times daily with a meal. 60 tablet 6  . dorzolamide-timolol (COSOPT) 22.3-6.8 MG/ML ophthalmic solution Place 1 drop into both eyes 2 (two) times daily.    . Empagliflozin-metFORMIN HCl (SYNJARDY) 12.06-998 MG TABS Take 1 tablet by mouth 2 (two) times a day.     . esomeprazole (NEXIUM) 40 MG capsule Take 40 mg by mouth daily at 12 noon.    . insulin glargine, 2 Unit Dial, (TOUJEO MAX SOLOSTAR) 300 UNIT/ML Solostar Pen Inject 40 Units into the skin daily.     Marland Kitchen  ketorolac (ACULAR) 0.5 % ophthalmic solution Place 1 drop into both eyes 4 (four) times daily. (Patient taking differently: Place 1 drop into both eyes in the morning and at bedtime. ) 10 mL 3  . lisinopril (ZESTRIL) 40 MG tablet Take 40 mg by mouth daily.    . pantoprazole (PROTONIX) 40 MG tablet Take 40 mg by mouth daily.    . prednisoLONE acetate (PRED FORTE) 1 % ophthalmic suspension Place 1 drop into both eyes 4 (four) times daily. 15 mL 2   No facility-administered medications prior to visit.      Review of Systems  Review of Systems   Physical Exam  There were no vitals taken for this visit. Physical Exam   Lab Results:  CBC    Component Value Date/Time   WBC 4.8 06/26/2019 1452   WBC 4.5 04/11/2019 0055   RBC 4.12 (L) 06/26/2019 1452   RBC 3.86 (L) 04/11/2019 0055   HGB 10.5 (L) 06/26/2019 1452   HCT 33.1 (L) 06/26/2019 1452   PLT 134 (L) 06/26/2019 1452   MCV 80 06/26/2019 1452   MCH 25.5 (L) 06/26/2019 1452   MCH 25.4 (L) 04/11/2019 0055   MCHC 31.7 06/26/2019 1452   MCHC 29.4 (L) 04/11/2019 0055   RDW 16.0 (H) 06/26/2019 1452   LYMPHSABS 1.1 03/29/2015 0637   MONOABS 0.7 03/29/2015 0637   EOSABS 0.1 03/29/2015 0637   BASOSABS 0.0 03/29/2015 0637    BMET    Component Value Date/Time   NA 140 06/26/2019 1452   K 4.8 06/26/2019 1452   CL 103 06/26/2019 1452   CO2 22 06/26/2019 1452   GLUCOSE 131 (H) 06/26/2019 1452   GLUCOSE 190 (H) 04/11/2019 0055   BUN 21 06/26/2019 1452   CREATININE 1.33 (H) 06/26/2019 1452   CALCIUM 9.6 06/26/2019 1452   GFRNONAA 59 (L) 06/26/2019 1452   GFRAA 68 06/26/2019 1452    BNP    Component Value Date/Time   BNP 71.8 03/29/2015 0556    ProBNP    Component Value Date/Time   PROBNP 31.3 02/28/2013 0758    Imaging: Intravitreal Injection, Pharmacologic Agent - OD - Right Eye  Result Date: 08/18/2019 Time Out 08/17/2019. 3:34 PM. Confirmed correct patient, procedure, site, and patient consented. Anesthesia  Topical anesthesia was used. Anesthetic medications included Lidocaine 2%, Proparacaine 0.5%. Procedure Preparation included eyelid speculum, 5% betadine to ocular surface. A (32g) needle was used. Injection: 2 mg aflibercept Alfonse Flavors) SOLN   NDC: A3590391, Lot: [id number], Expiration date: 12/02/2019   Route: Intravitreal, Site: Right Eye, Waste: 0.05 mL Post-op Post injection exam found visual acuity of at least counting fingers. The patient tolerated the procedure well. There were no complications. The patient received written and verbal post procedure care education.   Intravitreal  Injection, Pharmacologic Agent - OS - Left Eye  Result Date: 08/18/2019 Time Out 08/17/2019. 3:34 PM. Confirmed correct patient, procedure, site, and patient consented. Anesthesia Topical anesthesia was used. Anesthetic medications included Lidocaine 2%, Proparacaine 0.5%. Procedure Preparation included 5% betadine to ocular surface, eyelid speculum. A (32g) needle was used. Injection: 2 mg aflibercept Alfonse Flavors) SOLN   NDC: M7179715, Lot: [id number], Expiration date: 12/02/2019   Route: Intravitreal, Site: Left Eye, Waste: 0.05 mL Post-op Post injection exam found visual acuity of at least counting fingers. The patient tolerated the procedure well. There were no complications. The patient received written and verbal post procedure care education.   OCT, Retina - OU - Both Eyes  Result Date: 08/18/2019 Right Eye Quality was good. Central Foveal Thickness: 316. Progression has improved. Findings include abnormal foveal contour, intraretinal hyper-reflective material, intraretinal fluid, outer retinal atrophy, no SRF (Mild interval improvement in cystic changes). Left Eye Quality was good. Central Foveal Thickness: 350. Progression has improved. 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 CME - cystoid macular edema. PED - pigment epithelial detachment. IRF - intraretinal fluid. SRF - subretinal fluid. EZ - ellipsoid zone. ERM - epiretinal membrane. ORA - outer retinal atrophy. ORT - outer retinal tubulation. SRHM - subretinal hyper-reflective material     Assessment & Plan:   No problem-specific Assessment & Plan notes found for this encounter.     Martyn Ehrich, NP 08/30/2019

## 2019-08-31 ENCOUNTER — Ambulatory Visit: Payer: BC Managed Care – PPO | Admitting: Primary Care

## 2019-09-10 DIAGNOSIS — E113413 Type 2 diabetes mellitus with severe nonproliferative diabetic retinopathy with macular edema, bilateral: Secondary | ICD-10-CM | POA: Diagnosis not present

## 2019-09-10 DIAGNOSIS — E1140 Type 2 diabetes mellitus with diabetic neuropathy, unspecified: Secondary | ICD-10-CM | POA: Diagnosis not present

## 2019-09-10 DIAGNOSIS — E1165 Type 2 diabetes mellitus with hyperglycemia: Secondary | ICD-10-CM | POA: Diagnosis not present

## 2019-09-10 DIAGNOSIS — E1169 Type 2 diabetes mellitus with other specified complication: Secondary | ICD-10-CM | POA: Diagnosis not present

## 2019-09-10 DIAGNOSIS — Z794 Long term (current) use of insulin: Secondary | ICD-10-CM | POA: Diagnosis not present

## 2019-09-10 DIAGNOSIS — E1129 Type 2 diabetes mellitus with other diabetic kidney complication: Secondary | ICD-10-CM | POA: Diagnosis not present

## 2019-09-23 NOTE — Patient Instructions (Addendum)
Nice seeing you today Juan [name]  Pulmonary function testing today showed moderate restriction, no obstruction. No bronchodilatory response. Normal diffusion capacity. This is not consistent with asthma, however, it doesn't rule it out. CT scan showed mild emphysema. Recommend we try adding an inhaler called Spiriva. If no improvement with this addition recommend following up again with cardiology to discuss your next options. Continue to work on weight loss cause this is also likely contributing to your shortness of breath  Recommendations: - Work on weight loss effort - Continue Advair 1 puffs twice daily - Add Spiriva respimat 2 puffs once daily in the morning  Follow-up: - 2 week follow-up televisit with Juan Maize NP

## 2019-09-23 NOTE — Progress Notes (Signed)
@Patient  ID: Juan [name], male    DOB: 28-Jan-1961, 59 y.o.   MRN: [id number]  Chief Complaint  Patient presents with  . Follow-up    PFT performed today.  Pt states he has been okay since last visit. States he is still becoming SOB that is worse with activities.    Referring provider: Harlan Stains, MD  HPI: 59 year old male, former smoker. PMH significant for HTN, CAP, DM2, abnormal stress test, dyslipidemia, obesity, Patient of Dr. Ander Slade, seen for initial consult on 04/19/19 for shortness of breath. Ordered for PFTs, scheduled for CT chest in April 2021.   09/24/2019 Patient presents today for 4 month follow-up with PFTs. Reports having shortness of breath for the last year and half. Dyspnea occurs with activity. Associated np cough and wheezing. Prior to covid he would go dancing with his wife without any significant shortness or breath. He never tested positive for covid-19. He has been vaccinated. He has put on 20-25 lbs over the last year.  CT chest in March showed air trapping and mild emphysema, resolution ground glass nodularity and severe CAD. He had apical ischemia on stress testing. He had cardiac catheterization in May with cardiology that showed EF 55-65%, Dist RCA 50% stenosed, RPDA lesion 60% stenosed, distal cx was 85% stenosed.   Cardiac testing: - 06/29/19 Cardiac catheterization- Moderately to severely calcified coronary arteries with significant one-vessel coronary artery disease affecting the distal left circumflex. Normal LV systolic function mildly elevated left ventricular end-diastolic pressure  Pulmonary function testing: 09/24/2019 - FVC 2.76 (63%), FEV1 2.33 (70%), ratio 84, TLC 92%, DLCOcor 25.84 (101%) Interpretation: Moderate restriction with normal diffusion capacity. No evidence of obstruction or BD response.   Allergies  Allergen Reactions  . Naproxen Shortness Of Breath  . Naproxen Sodium Shortness Of Breath  . Penicillins Rash    Has patient  had a PCN reaction causing immediate rash, facial/tongue/throat swelling, SOB or lightheadedness with hypotension: Yes Has patient had a PCN reaction causing severe rash involving mucus membranes or skin necrosis: No Has patient had a PCN reaction that required hospitalization No Has patient had a PCN reaction occurring within the last 10 years: No If all of the above answers are "NO", then may proceed with Cephalosporin use.  Has patient had a PCN reaction causing immediate rash, facial/tongue/throat swelling, SOB or lightheadedness with hypotension: Yes Has patient had a PCN reaction causing severe rash involving mucus membranes or skin necrosis: No Has patient had a PCN reaction that required hospitalization No Has patient had a PCN reaction occurring within the last 10 years: No If all of the above answers are "NO", then may proceed with Cephalosporin use.     Immunization History  Administered Date(s) Administered  . PFIZER SARS-COV-2 Vaccination 04/18/2019, 05/09/2019    Past Medical History:  Diagnosis Date  . Arthritis   . Asthma   . COPD (chronic obstructive pulmonary disease) ([place])   . Diabetes mellitus    Type 2  . Diabetic retinopathy ([place])    NPDR OU  . GERD (gastroesophageal reflux disease)   . H/O hiatal hernia   . History of kidney stones   . Hyperlipemia   . Hypertension   . Hypertensive retinopathy    OU  . Nasal polyps   . Pneumonia 2014  . Shortness of breath   . Sleep apnea    pt has CPAP but is unable to use it d/t having polyps in his nose. Pt stated "I need to  call and get a CPAP mask instead"  . Stomach ulcer     Tobacco History: Social History   Tobacco Use  Smoking Status Former Smoker  . Years: 20.00  . Types: Cigarettes  Smokeless Tobacco Current User  . Types: Chew  Tobacco Comment   1 can/daily   Ready to quit: Not Answered Counseling given: Not Answered Comment: 1 can/daily   Outpatient Medications Prior to Visit  Medication  Sig Dispense Refill  . ADVAIR DISKUS 500-50 MCG/DOSE AEPB Inhale 1 puff into the lungs 2 (two) times daily.    Marland Kitchen albuterol (PROVENTIL HFA;VENTOLIN HFA) 108 (90 BASE) MCG/ACT inhaler Inhale 1-2 puffs into the lungs every 6 (six) hours as needed for wheezing or shortness of breath.     . alfuzosin (UROXATRAL) 10 MG 24 hr tablet TAKE 1 TABLET BY MOUTH TWICE A DAY 60 tablet 11  . aspirin EC 81 MG tablet Take 81 mg by mouth daily.    Marland Kitchen atorvastatin (LIPITOR) 40 MG tablet Take 40 mg by mouth daily.    . carvedilol (COREG) 6.25 MG tablet Take 1 tablet (6.25 mg total) by mouth 2 (two) times daily with a meal. 60 tablet 6  . dorzolamide-timolol (COSOPT) 22.3-6.8 MG/ML ophthalmic solution Place 1 drop into both eyes 2 (two) times daily.    . Empagliflozin-metFORMIN HCl (SYNJARDY) 12.06-998 MG TABS Take 1 tablet by mouth 2 (two) times a day.     . esomeprazole (NEXIUM) 40 MG capsule Take 40 mg by mouth daily at 12 noon.    . insulin glargine, 2 Unit Dial, (TOUJEO MAX SOLOSTAR) 300 UNIT/ML Solostar Pen Inject 40 Units into the skin daily.     Marland Kitchen ketorolac (ACULAR) 0.5 % ophthalmic solution Place 1 drop into both eyes 4 (four) times daily. (Patient taking differently: Place 1 drop into both eyes in the morning and at bedtime. ) 10 mL 3  . lisinopril (ZESTRIL) 40 MG tablet Take 40 mg by mouth daily.    . pantoprazole (PROTONIX) 40 MG tablet Take 40 mg by mouth daily.    . prednisoLONE acetate (PRED FORTE) 1 % ophthalmic suspension Place 1 drop into both eyes 4 (four) times daily. 15 mL 2  . Semaglutide,0.25 or 0.5MG /DOS, (OZEMPIC, 0.25 OR 0.5 MG/DOSE,) 2 MG/1.5ML SOPN Inject into the skin.    Marland Kitchen alfuzosin (UROXATRAL) 10 MG 24 hr tablet Take 10 mg by mouth daily.     No facility-administered medications prior to visit.    Review of Systems  Review of Systems  Constitutional: Negative.        Weight gain  Respiratory: Positive for cough, shortness of breath and wheezing.     Physical Exam  BP 130/72 (BP  Location: Left Arm, Cuff Size: Large)   Pulse 95   Ht 5\' 9"  (1.753 m)   Wt 241 lb (109.3 kg)   SpO2 98%   BMI 35.59 kg/m  Physical Exam Constitutional:      Appearance: Normal appearance.  HENT:     Head: Normocephalic and atraumatic.     Mouth/Throat:     Mouth: Mucous membranes are moist.     Pharynx: Oropharynx is clear.  Cardiovascular:     Rate and Rhythm: Normal rate and regular rhythm.     Comments: Trace LE swelling Pulmonary:     Effort: Pulmonary effort is normal.     Breath sounds: Normal breath sounds. No wheezing, rhonchi or rales.  Neurological:     General: No focal deficit present.  Mental Status: He is alert and oriented to person, place, and time. Mental status is at baseline.  Psychiatric:        Mood and Affect: Mood normal.        Behavior: Behavior normal.        Thought Content: Thought content normal.        Judgment: Judgment normal.      Lab Results:  CBC    Component Value Date/Time   WBC 4.8 06/26/2019 1452   WBC 4.5 04/11/2019 0055   RBC 4.12 (L) 06/26/2019 1452   RBC 3.86 (L) 04/11/2019 0055   HGB 10.5 (L) 06/26/2019 1452   HCT 33.1 (L) 06/26/2019 1452   PLT 134 (L) 06/26/2019 1452   MCV 80 06/26/2019 1452   MCH 25.5 (L) 06/26/2019 1452   MCH 25.4 (L) 04/11/2019 0055   MCHC 31.7 06/26/2019 1452   MCHC 29.4 (L) 04/11/2019 0055   RDW 16.0 (H) 06/26/2019 1452   LYMPHSABS 1.1 03/29/2015 0637   MONOABS 0.7 03/29/2015 0637   EOSABS 0.1 03/29/2015 0637   BASOSABS 0.0 03/29/2015 0637    BMET    Component Value Date/Time   NA 140 06/26/2019 1452   K 4.8 06/26/2019 1452   CL 103 06/26/2019 1452   CO2 22 06/26/2019 1452   GLUCOSE 131 (H) 06/26/2019 1452   GLUCOSE 190 (H) 04/11/2019 0055   BUN 21 06/26/2019 1452   CREATININE 1.33 (H) 06/26/2019 1452   CALCIUM 9.6 06/26/2019 1452   GFRNONAA 59 (L) 06/26/2019 1452   GFRAA 68 06/26/2019 1452    BNP    Component Value Date/Time   BNP 71.8 03/29/2015 0556    ProBNP      Component Value Date/Time   PROBNP 31.3 02/28/2013 0758    Imaging: No results found.   Assessment & Plan:   Dyspnea - Patient has had dyspnea on exertion for 1.5 years. He has significant coronary artery disease.  His weight is up 20-25lbs over the last year or two. - PFTs today showed moderate restriction with normal diffusion capacity. No evidence of obstruction or BD response. Restriction likely due to body habitus.  Spirometry not consistent with asthma or COPD.  - CT chest in March did show mild emphysema and he was a former smoker. We will trial LAMA in addition to current Advair 1 puff twice daily. If no improvement he can stop inhaler and needs to follow-up with Cardiology.  - Encourage weight loss efforts  Community acquired pneumonia - CT chest in March showed resolution of ground glass nodularity    Martyn Ehrich, NP 09/24/2019

## 2019-09-24 ENCOUNTER — Ambulatory Visit: Payer: BC Managed Care – PPO | Admitting: Primary Care

## 2019-09-24 ENCOUNTER — Other Ambulatory Visit: Payer: Self-pay

## 2019-09-24 ENCOUNTER — Ambulatory Visit (INDEPENDENT_AMBULATORY_CARE_PROVIDER_SITE_OTHER): Payer: BC Managed Care – PPO | Admitting: Pulmonary Disease

## 2019-09-24 ENCOUNTER — Encounter: Payer: Self-pay | Admitting: Primary Care

## 2019-09-24 ENCOUNTER — Other Ambulatory Visit: Payer: Self-pay | Admitting: *Deleted

## 2019-09-24 DIAGNOSIS — R0609 Other forms of dyspnea: Secondary | ICD-10-CM

## 2019-09-24 DIAGNOSIS — R0600 Dyspnea, unspecified: Secondary | ICD-10-CM | POA: Diagnosis not present

## 2019-09-24 DIAGNOSIS — R0602 Shortness of breath: Secondary | ICD-10-CM

## 2019-09-24 DIAGNOSIS — J189 Pneumonia, unspecified organism: Secondary | ICD-10-CM | POA: Diagnosis not present

## 2019-09-24 LAB — PULMONARY FUNCTION TEST
DL/VA % pred: 115 %
DL/VA: 4.99 ml/min/mmHg/L
DLCO cor % pred: 101 %
DLCO cor: 25.84 ml/min/mmHg
DLCO unc % pred: 101 %
DLCO unc: 25.84 ml/min/mmHg
FEF 25-75 Post: 2.69 L/sec
FEF 25-75 Pre: 2.75 L/sec
FEF2575-%Change-Post: -2 %
FEF2575-%Pred-Post: 95 %
FEF2575-%Pred-Pre: 98 %
FEV1-%Change-Post: 1 %
FEV1-%Pred-Post: 70 %
FEV1-%Pred-Pre: 69 %
FEV1-Post: 2.33 L
FEV1-Pre: 2.3 L
FEV1FVC-%Change-Post: -1 %
FEV1FVC-%Pred-Pre: 112 %
FEV6-%Change-Post: 2 %
FEV6-%Pred-Post: 66 %
FEV6-%Pred-Pre: 65 %
FEV6-Post: 2.76 L
FEV6-Pre: 2.69 L
FEV6FVC-%Pred-Post: 104 %
FEV6FVC-%Pred-Pre: 104 %
FVC-%Change-Post: 2 %
FVC-%Pred-Post: 63 %
FVC-%Pred-Pre: 62 %
FVC-Post: 2.76 L
FVC-Pre: 2.69 L
Post FEV1/FVC ratio: 84 %
Post FEV6/FVC ratio: 100 %
Pre FEV1/FVC ratio: 85 %
Pre FEV6/FVC Ratio: 100 %
RV % pred: 80 %
RV: 1.64 L
TLC % pred: 92 %
TLC: 5.89 L

## 2019-09-24 MED ORDER — SPIRIVA RESPIMAT 2.5 MCG/ACT IN AERS: 2.0000 | INHALATION_SPRAY | Freq: Every day | RESPIRATORY_TRACT | 5 refills | Status: DC

## 2019-09-24 MED ORDER — SPIRIVA RESPIMAT 2.5 MCG/ACT IN AERS: 2.0000 | INHALATION_SPRAY | Freq: Every day | RESPIRATORY_TRACT | 0 refills | Status: DC

## 2019-09-24 NOTE — Progress Notes (Signed)
Full PFT performed today. °

## 2019-09-24 NOTE — Assessment & Plan Note (Signed)
-   CT chest in March showed resolution of ground glass nodularity

## 2019-09-24 NOTE — Assessment & Plan Note (Addendum)
-   Patient has had dyspnea on exertion for 1.5 years. He has significant coronary artery disease.  His weight is up 20-25lbs over the last year or two. - PFTs today showed moderate restriction with normal diffusion capacity. No evidence of obstruction or BD response. Restriction likely due to body habitus.  Spirometry not consistent with asthma or COPD.  - CT chest in March did show mild emphysema and he was a former smoker. We will trial LAMA in addition to current Advair 1 puff twice daily. If no improvement he can stop inhaler and needs to follow-up with Cardiology.  - Encourage weight loss efforts

## 2019-10-02 ENCOUNTER — Encounter (INDEPENDENT_AMBULATORY_CARE_PROVIDER_SITE_OTHER): Payer: BC Managed Care – PPO | Admitting: Ophthalmology

## 2019-10-05 ENCOUNTER — Encounter (INDEPENDENT_AMBULATORY_CARE_PROVIDER_SITE_OTHER): Payer: BC Managed Care – PPO | Admitting: Ophthalmology

## 2019-10-10 ENCOUNTER — Encounter: Payer: Self-pay | Admitting: Primary Care

## 2019-10-10 ENCOUNTER — Other Ambulatory Visit: Payer: Self-pay

## 2019-10-10 ENCOUNTER — Ambulatory Visit (INDEPENDENT_AMBULATORY_CARE_PROVIDER_SITE_OTHER): Payer: BC Managed Care – PPO | Admitting: Primary Care

## 2019-10-10 DIAGNOSIS — J439 Emphysema, unspecified: Secondary | ICD-10-CM

## 2019-10-10 NOTE — Patient Instructions (Signed)
Continue Advair and Spiriva  Follow-up 3 months with Dr. Ander Slade

## 2019-10-10 NOTE — Progress Notes (Signed)
Virtual Visit via Telephone Note  I connected with Juan [name] on 10/10/19 at  9:30 AM EDT by telephone and verified that I am speaking with the correct person using two identifiers.  Location: Patient: Home Provider: Office   I discussed the limitations, risks, security and privacy concerns of performing an evaluation and management service by telephone and the availability of in person appointments. I also discussed with the patient that there may be a patient responsible charge related to this service. The patient expressed understanding and agreed to proceed.   History of Present Illness:  59 year old male, former smoker (approx 50 pack year hx). PMH significant for HTN, CAP, DM2, abnormal stress test, dyslipidemia, obesity, Patient of Dr. Ander Slade, seen for initial consult on 04/19/19 for shortness of breath. Ordered for PFTs, scheduled for CT chest in April 2021. Still using chewing tobacco.   09/24/2019 Patient presents today for 4 month follow-up with PFTs. Reports having shortness of breath for the last year and half. Dyspnea occurs with activity. Associated np cough and wheezing. Prior to covid he would go dancing with his wife without any significant shortness or breath. He never tested positive for covid-19. He has been vaccinated. He has put on 20-25 lbs over the last year.  CT chest in March showed air trapping and mild emphysema, resolution ground glass nodularity and severe CAD. He had apical ischemia on stress testing. He had cardiac catheterization in May with cardiology that showed EF 55-65%, Dist RCA 50% stenosed, RPDA lesion 60% stenosed, distal cx was 85% stenosed.  10/10/2019 Patient contacted today for follow-up televisit. PFTs showed moderate restriction with no BD response and normal diffusion capacity. CT chest in March showed mild emphysema. D/t patient smoking hx tried LAMA in addition to Advair. If no improvement recommend follow-up with cardiology.   He has a  moderate smoking hx. Reports that he smoked 2-3 ppd x 20 years. He is still using chewing tobacco. Current medications are Advair 500-74mcg and Spiriva Respimat. He has been at the beach the last week and reports that he did a lot of walking and his breathing did really well. He is experiencing less dyspnea with exertion. He had some chest tightness this morning, he used his albuterol nebulizer with improvement.    Observations/Objective:  - Able to speak in full sentences; no overt shortness of breath or wheezing  Cardiac testing: - 06/29/19 Cardiac catheterization- Moderately to severely calcified coronary arteries with significant one-vessel coronary artery disease affecting the distal left circumflex. Normal LV systolic function mildly elevated left ventricular end-diastolic pressure  Pulmonary function testing: 09/24/2019 - FVC 2.76 (63%), FEV1 2.33 (70%), ratio 84, TLC 92%, DLCOcor 25.84 (101%) Interpretation: Moderate restriction with normal diffusion capacity. No evidence of obstruction or BD response.   Assessment and Plan:  COPD GOLD 0/emphsyema: - Former 69 pack year hx smoker, emphysema on imaging  - Patient has moderate restriction on PFTs with no BD repsonse - Dyspnea significantly improved with addition of Spiriva Respimat - Continue Advair 500-41mcg one puff twice daily  - Consider stepping down ICS therapy at next visit   Follow Up Instructions:   - 3 months with Dr. Ander Slade  I discussed the assessment and treatment plan with the patient. The patient was provided an opportunity to ask questions and all were answered. The patient agreed with the plan and demonstrated an understanding of the instructions.   The patient was advised to call back or seek an in-person evaluation if the symptoms  worsen or if the condition fails to improve as anticipated.  I provided 18 minutes of non-face-to-face time during this encounter.   Martyn Ehrich, NP

## 2019-10-11 DIAGNOSIS — J441 Chronic obstructive pulmonary disease with (acute) exacerbation: Secondary | ICD-10-CM | POA: Diagnosis not present

## 2019-10-11 DIAGNOSIS — J40 Bronchitis, not specified as acute or chronic: Secondary | ICD-10-CM | POA: Diagnosis not present

## 2019-10-15 ENCOUNTER — Ambulatory Visit: Payer: BC Managed Care – PPO | Admitting: Internal Medicine

## 2019-10-22 NOTE — Progress Notes (Signed)
[facility] Note  10/23/2019     CHIEF COMPLAINT Patient presents for Retina Follow Up   HISTORY OF PRESENT ILLNESS: Juan [name] is a 59 y.o. male who presents to the clinic today for:   HPI    Retina Follow Up    Patient presents with  Diabetic Retinopathy.  In both eyes.  This started months ago.  Severity is moderate.  Duration of 9.5 weeks.  Since onset it is stable.  I, the attending physician,  performed the HPI with the patient and updated documentation appropriately.          Comments    59 y/o male pt here for 9.5 wk f/u for severe NPDR w/DME OU.  No change in [place] OU overall, but is noticing more frequent bouts of diplopia when going from near to far viewing and back.  Denies pain, FOL, floaters.  Pred and Ketorolac BID OU.  Cosopt BID OD.  BS running around 120.  A1C 7.1.       Last edited by Bernarda Caffey, MD on 10/23/2019  4:50 PM. (History)    pt states he has been having a lot of double vision over the past month, he states he still has a blank spot in his left eye vision, he states the double vision is worse when he is looking to the left   Referring physician: Rutherford Guys, MD [place],  [place] [address]  HISTORICAL INFORMATION:   Selected notes from the [place] Referred by Dr. Rutherford Guys for concern of CME s/p cataract sx   CURRENT MEDICATIONS: Current Outpatient Medications (Ophthalmic Drugs)  Medication Sig  . dorzolamide-timolol (COSOPT) 22.3-6.8 MG/ML ophthalmic solution Place 1 drop into both eyes 2 (two) times daily.  Marland Kitchen ketorolac (ACULAR) 0.5 % ophthalmic solution Place 1 drop into both eyes 4 (four) times daily. (Patient taking differently: Place 1 drop into both eyes in the morning and at bedtime. )  . prednisoLONE acetate (PRED FORTE) 1 % ophthalmic suspension Place 1 drop into both eyes 4 (four) times daily.   No current facility-administered medications for this visit. (Ophthalmic  Drugs)   Current Outpatient Medications (Other)  Medication Sig  . ADVAIR DISKUS 500-50 MCG/DOSE AEPB Inhale 1 puff into the lungs 2 (two) times daily.  Marland Kitchen albuterol (PROVENTIL HFA;VENTOLIN HFA) 108 (90 BASE) MCG/ACT inhaler Inhale 1-2 puffs into the lungs every 6 (six) hours as needed for wheezing or shortness of breath.   . alfuzosin (UROXATRAL) 10 MG 24 hr tablet TAKE 1 TABLET BY MOUTH TWICE A DAY  . aspirin EC 81 MG tablet Take 81 mg by mouth daily.  Marland Kitchen atorvastatin (LIPITOR) 40 MG tablet Take 40 mg by mouth daily.  . carvedilol (COREG) 6.25 MG tablet Take 1 tablet (6.25 mg total) by mouth 2 (two) times daily with a meal.  . Empagliflozin-metFORMIN HCl (SYNJARDY) 12.06-998 MG TABS Take 1 tablet by mouth 2 (two) times a day.   . esomeprazole (NEXIUM) 40 MG capsule Take 40 mg by mouth daily at 12 noon.  . insulin glargine, 2 Unit Dial, (TOUJEO MAX SOLOSTAR) 300 UNIT/ML Solostar Pen Inject 40 Units into the skin daily.   . iron polysaccharides (NIFEREX) 150 MG capsule Take 150 mg by mouth daily.  Marland Kitchen lisinopril (ZESTRIL) 40 MG tablet Take 40 mg by mouth daily.  . pantoprazole (PROTONIX) 40 MG tablet Take 40 mg by mouth daily.  . predniSONE (DELTASONE) 20 MG tablet Take  by mouth.  . Semaglutide,0.25 or 0.5MG /DOS, (OZEMPIC, 0.25 OR 0.5 MG/DOSE,) 2 MG/1.5ML SOPN Inject into the skin.  . Tiotropium Bromide Monohydrate (SPIRIVA RESPIMAT) 2.5 MCG/ACT AERS Inhale 2 puffs into the lungs daily.   No current facility-administered medications for this visit. (Other)      REVIEW OF SYSTEMS: ROS    Positive for: Genitourinary, Musculoskeletal, Endocrine, Eyes, Respiratory   Negative for: Constitutional, Gastrointestinal, Neurological, Skin, HENT, Cardiovascular, Psychiatric, Allergic/Imm, Heme/Lymph   Last edited by Matthew Folks, COA on 10/23/2019  2:45 PM. (History)       ALLERGIES Allergies  Allergen Reactions  . Naproxen Shortness Of Breath  . Naproxen Sodium Shortness Of Breath  .  Penicillins Rash    Has patient had a PCN reaction causing immediate rash, facial/tongue/throat swelling, SOB or lightheadedness with hypotension: Yes Has patient had a PCN reaction causing severe rash involving mucus membranes or skin necrosis: No Has patient had a PCN reaction that required hospitalization No Has patient had a PCN reaction occurring within the last 10 years: No If all of the above answers are "NO", then may proceed with Cephalosporin use.  Has patient had a PCN reaction causing immediate rash, facial/tongue/throat swelling, SOB or lightheadedness with hypotension: Yes Has patient had a PCN reaction causing severe rash involving mucus membranes or skin necrosis: No Has patient had a PCN reaction that required hospitalization No Has patient had a PCN reaction occurring within the last 10 years: No If all of the above answers are "NO", then may proceed with Cephalosporin use.     PAST MEDICAL HISTORY Past Medical History:  Diagnosis Date  . Arthritis   . Asthma   . COPD (chronic obstructive pulmonary disease) ([place])   . Diabetes mellitus    Type 2  . Diabetic retinopathy ([place])    NPDR OU  . GERD (gastroesophageal reflux disease)   . H/O hiatal hernia   . History of kidney stones   . Hyperlipemia   . Hypertension   . Hypertensive retinopathy    OU  . Nasal polyps   . Pneumonia 2014  . Shortness of breath   . Sleep apnea    pt has CPAP but is unable to use it d/t having polyps in his nose. Pt stated "I need to call and get a CPAP mask instead"  . Stomach ulcer    Past Surgical History:  Procedure Laterality Date  . CATARACT EXTRACTION Right 07/05/2018   Dr. Gershon Crane  . CATARACT EXTRACTION Left 07/12/2018   Dr. Gershon Crane  . CHOLECYSTECTOMY N/A 01/23/2015   Procedure: LAPAROSCOPIC CHOLECYSTECTOMY ;  Surgeon: Georganna Skeans, MD;  Location: [facility];  Service: General;  Laterality: N/A;  . COLONOSCOPY W/ POLYPECTOMY    . ESOPHAGOGASTRODUODENOSCOPY    . EYE SURGERY     . KNEE ARTHROSCOPY  03/24/2011   Procedure: ARTHROSCOPY KNEE;  Surgeon: Alta Corning, MD;  Location: [facility];  Service: Orthopedics;  Laterality: Right;  partial medial menisectomy, chondroplaty patella, and medial medial plica  . LEFT HEART CATH AND CORONARY ANGIOGRAPHY N/A 06/29/2019   Procedure: LEFT HEART CATH AND CORONARY ANGIOGRAPHY;  Surgeon: Wellington Hampshire, MD;  Location: [facility];  Service: Cardiovascular;  Laterality: N/A;    FAMILY HISTORY Family History  Problem Relation Age of Onset  . Diabetes Father     SOCIAL HISTORY Social History   Tobacco Use  . Smoking status: Former Smoker    Packs/day: 3.00    Years: 20.00  Pack years: 60.00    Types: Cigarettes  . Smokeless tobacco: Current User    Types: Chew  . Tobacco comment: 1 can/daily  Substance Use Topics  . Alcohol use: Yes    Comment: social, 1x weekly  . Drug use: No         OPHTHALMIC EXAM:  Base Eye Exam    Visual Acuity (Snellen - Linear)      Right Left   Dist [place] 20/40 -2 20/60   Dist ph [place] 20/25 -2 NI       Tonometry (Tonopen, 2:49 PM)      Right Left   Pressure 13 13       Pupils      Dark Light Shape React APD   Right 3 2 Round Brisk None   Left 3 2 Round Brisk None       Visual Fields (Counting fingers)      Left Right    Full Full       Extraocular Movement      Right Left    Full, Ortho Full, Ortho       Neuro/Psych    Oriented x3: Yes   Mood/Affect: Normal       Dilation    Both eyes: 1.0% Mydriacyl, 2.5% Phenylephrine @ 2:49 PM        Slit Lamp and Fundus Exam    Slit Lamp Exam      Right Left   Lids/Lashes Dermatochalasis - upper lid, Telangiectasia, mild Meibomian gland dysfunction Dermatochalasis - upper lid, Telangiectasia, mild Meibomian gland dysfunction   Conjunctiva/Sclera White and quiet, +concretions on palpebral conj Mild nasal Pinguecula, +concretions on palpebral conj   Cornea Mild Arcus, Well healed temporal  cataract wounds Arcus, 1+ Descemet's folds, Well healed temporal cataract wounds   Anterior Chamber deep, 1+ cell/pigment deep, 1+ cell/pigment   Iris Round and dilated, mild patch of atrophy at 0800, no NVI Round and dilated, No NVI   Lens PC IOL in good position PC IOL in good position   Vitreous Vitreous syneresis Vitreous syneresis       Fundus Exam      Right Left   Disc Pink and Sharp Pallor, Sharp rim   C/D Ratio 0.4 0.3   Macula Blunted foveal reflex, +cystic edema / IRF -- improved, scattered MA and exudate greatest temporal macula Blunted foveal reflex, +central edema -- improving, scattered DBH and MAs, scattered puncatate exudates - improving   Vessels Vascular attenuation, Tortuous Vascular attenuation, Tortuous   Periphery Attached, scattered MA Attached, pigmented choroidal nevus superiorly with mild elevation, +drusen, no SRF          IMAGING AND PROCEDURES  Imaging and Procedures for @TODAY @  OCT, Retina - OU - Both Eyes       Right Eye Quality was good. Central Foveal Thickness: 295. Progression has improved. Findings include abnormal foveal contour, intraretinal hyper-reflective material, intraretinal fluid, outer retinal atrophy, no SRF (Mild interval improvement in IRF/IRHM).   Left Eye Quality was good. Central Foveal Thickness: 303. Progression has improved. Findings include intraretinal fluid, intraretinal hyper-reflective material, abnormal foveal contour, no SRF (Mild interval improvement in IRF/IRHM, Sub-RPE hyper-reflective mass consistent with choroidal nevus--superior to disc, caught on widefield -- stable from prior).   Notes *Images captured and stored on drive  Diagnosis / Impression: Severe DME OU OD: Mild interval improvement in IRF/IRHM OS: mild interval improvement in IRF/IRHM, Sub-RPE hyper-reflective mass consistent with choroidal nevus--superior to disc, caught on widefield --  stable from prior   Clinical management:  See  below  Abbreviations: NFP - Normal foveal profile. CME - cystoid macular edema. PED - pigment epithelial detachment. IRF - intraretinal fluid. SRF - subretinal fluid. EZ - ellipsoid zone. ERM - epiretinal membrane. ORA - outer retinal atrophy. ORT - outer retinal tubulation. SRHM - subretinal hyper-reflective material        Intravitreal Injection, Pharmacologic Agent - OD - Right Eye       Time Out 10/23/2019. 3:29 PM. Confirmed correct patient, procedure, site, and patient consented.   Anesthesia Topical anesthesia was used. Anesthetic medications included Lidocaine 2%, Proparacaine 0.5%.   Procedure Preparation included eyelid speculum, 5% betadine to ocular surface. A (32g) needle was used.   Injection:  2 mg aflibercept Alfonse Flavors) SOLN   NDC: A3590391, Lot: [id number], Expiration date: 02/01/2020   Route: Intravitreal, Site: Right Eye, Waste: 0.05 mL  Post-op Post injection exam found visual acuity of at least counting fingers. The patient tolerated the procedure well. There were no complications. The patient received written and verbal post procedure care education. Post injection medications were not given.        Intravitreal Injection, Pharmacologic Agent - OS - Left Eye       Time Out 10/23/2019. 3:30 PM. Confirmed correct patient, procedure, site, and patient consented.   Anesthesia Topical anesthesia was used. Anesthetic medications included Lidocaine 2%, Proparacaine 0.5%.   Procedure Preparation included 5% betadine to ocular surface, eyelid speculum. A (32g) needle was used.   Injection:  2 mg aflibercept Alfonse Flavors) SOLN   NDC: M7179715, Lot: [id number], Expiration date: 07/31/2020   Route: Intravitreal, Site: Left Eye, Waste: 0.05 mL  Post-op Post injection exam found visual acuity of at least counting fingers. The patient tolerated the procedure well. There were no complications. The patient received written and verbal post procedure care education.  Post injection medications were not given.                 ASSESSMENT/PLAN:    ICD-10-CM   1. Severe nonproliferative diabetic retinopathy of both eyes with macular edema associated with type 2 diabetes mellitus (HCC)  G95.6213 Intravitreal Injection, Pharmacologic Agent - OD - Right Eye    Intravitreal Injection, Pharmacologic Agent - OS - Left Eye    aflibercept (EYLEA) SOLN 2 mg    aflibercept (EYLEA) SOLN 2 mg  2. 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 Essential hypertension  I10   5. Hypertensive retinopathy of both eyes  H35.033   6. Pseudophakia of both eyes  Z96.1   7. Ocular hypertension of right eye  H40.051     1. Severe nonproliferative diabetic retinopathy with DME, OU (OS>OD)  - s/p IVA OD #1 (06.26.20), #2 (08.14.20), #3 (09.17.20), #4 (10.16.20)  - s/p IVA OS #1 (07.17.20), #2 (08.14.20), #3 (09.17.20)  - s/p IVA OU (08.14.20), #3 (09.17.20)  - s/p IVE OS #1 (10.16.20) - sample, #2 (11.13.20), #3 (12.11.20), #4 (01.18.21), #5 (02.19.21), #6 (03.19.21), #7 (04.16.21), #8 (05.14.21), #9 (06.17.21), #10 (7.16.21)  - s/p IVE OD #1 (11.13.20), #2 (12.11.20), #3 (01.18.21), #4 (02.19.21), #5 (03.19.21), #6 (04.16.21), #7 (05.14.21), #8 (06.17.21), #9 (7.16.21)  - FA (06.26.20) shows Severe NPDR OU w/ Late leaking MA OU, Enlarged FAZ OU, No NV OU, No significant hyperfluorescence of disc  - pt reporting worsening binocular diplopia in left gaze over the last month; no diplopia in  primary gaze  - BCVA: OD 20/25 - stable; OS stable at 20/60  - OCT shows mild interval improvement in IRF OU  - recommend IVE OD #10 and IVE #11 OS today, 09.21.21  - pt wishes to proceed  - RBA of procedure discussed, questions answered  - informed consent obtained  - Eylea informed consent form signed and scanned on 01.18.2021  - see procedure note  - [id number] Benefits Investigation initiated 10.16.2020 -- approved for 2021  - f/u in  4 weeks -- DFE/OCT/possible injection OU  2. Retinal Edema OU  - based on FA, suspect majority of macular edema due to DM as above, but there may be some edema secondary to post-op CME  - cont PF qid OU and ketorolac qid OU   - history of difficulty with compliance at work  - monitor  - f/u 4 wks  3. Choroidal Nevus OS  - located at 1200, mild elevation, +drusen, no SRF  - baseline optos pictures obtained, 06.26.20  - discussed possible referral to ocular oncologist at [facility] for evaluation/management  4,5.Hypertensive retinopathy OU  - discussed importance of tight BP control  - monitor  6. Pseudophakia OU  - s/p CE/IOL OU (OD on 06.03.20 and OS 06.10.20 by Dr. Gershon Crane)  - beautiful surgeries w/ IOLs in excellent position  - macular edema limiting vision as above  - monitor  7. Ocular hypertension OD  - IOP 13 OD  - cont Cosopt BID OD  - monitor   Ophthalmic Meds Ordered this visit:  Meds ordered this encounter  Medications  . aflibercept (EYLEA) SOLN 2 mg  . aflibercept (EYLEA) SOLN 2 mg      Return in about 4 weeks (around 11/20/2019) for f/u NPDR OU, DFE, OCT.  There are no Patient Instructions on file for this visit.   Explained the diagnoses, plan, and follow up with the patient and they expressed understanding.  Patient expressed understanding of the importance of proper follow up care.   This document serves as a record of services personally performed by Gardiner Sleeper, MD, PhD. It was created on their behalf by Estill Bakes, COT an ophthalmic technician. The creation of this record is the provider's dictation and/or activities during the visit.    Electronically signed by: Estill Bakes, COT 9.20.21 @ 4:53 PM   This document serves as a record of services personally performed by Gardiner Sleeper, MD, PhD. It was created on their behalf by San Jetty. Owens Shark, OA an ophthalmic technician. The creation of this record is the provider's dictation and/or  activities during the visit.    Electronically signed by: San Jetty. Owens Shark, [place] 09.21.2021 4:53 PM  Gardiner Sleeper, M.D., Ph.D. Diseases & Surgery of the Retina and [place] 10/23/2019   I have reviewed the above documentation for accuracy and completeness, and I agree with the above. Gardiner Sleeper, M.D., Ph.D. 10/23/19 4:53 PM   Abbreviations: M myopia (nearsighted); A astigmatism; H hyperopia (farsighted); P presbyopia; Mrx spectacle prescription;  CTL contact lenses; OD right eye; OS left eye; OU both eyes  XT exotropia; ET esotropia; PEK punctate epithelial keratitis; PEE punctate epithelial erosions; DES dry eye syndrome; MGD meibomian gland dysfunction; ATs artificial tears; PFAT's preservative free artificial tears; [place] nuclear sclerotic cataract; PSC posterior subcapsular cataract; ERM epi-retinal membrane; PVD posterior vitreous detachment; RD retinal detachment; DM diabetes mellitus; DR diabetic retinopathy; NPDR non-proliferative diabetic retinopathy; PDR proliferative diabetic retinopathy; CSME clinically  significant macular edema; DME diabetic macular edema; dbh dot blot hemorrhages; CWS cotton wool spot; POAG primary open angle glaucoma; C/D cup-to-disc ratio; HVF humphrey visual field; GVF goldmann visual field; OCT optical coherence tomography; IOP intraocular pressure; BRVO Branch retinal vein occlusion; CRVO central retinal vein occlusion; CRAO central retinal artery occlusion; BRAO branch retinal artery occlusion; RT retinal tear; SB scleral buckle; PPV pars plana vitrectomy; VH Vitreous hemorrhage; PRP panretinal laser photocoagulation; IVK intravitreal kenalog; VMT vitreomacular traction; MH Macular hole;  NVD neovascularization of the disc; NVE neovascularization elsewhere; AREDS age related eye disease study; ARMD age related macular degeneration; POAG primary open angle glaucoma; EBMD epithelial/anterior basement membrane dystrophy; ACIOL  anterior chamber intraocular lens; IOL intraocular lens; PCIOL posterior chamber intraocular lens; Phaco/IOL phacoemulsification with intraocular lens placement; [place] photorefractive keratectomy; LASIK laser assisted in situ keratomileusis; HTN hypertension; DM diabetes mellitus; COPD chronic obstructive pulmonary disease

## 2019-10-23 ENCOUNTER — Other Ambulatory Visit: Payer: Self-pay

## 2019-10-23 ENCOUNTER — Ambulatory Visit (INDEPENDENT_AMBULATORY_CARE_PROVIDER_SITE_OTHER): Payer: BC Managed Care – PPO | Admitting: Ophthalmology

## 2019-10-23 ENCOUNTER — Encounter (INDEPENDENT_AMBULATORY_CARE_PROVIDER_SITE_OTHER): Payer: Self-pay | Admitting: Ophthalmology

## 2019-10-23 DIAGNOSIS — D3132 Benign neoplasm of left choroid: Secondary | ICD-10-CM

## 2019-10-23 DIAGNOSIS — E113413 Type 2 diabetes mellitus with severe nonproliferative diabetic retinopathy with macular edema, bilateral: Secondary | ICD-10-CM

## 2019-10-23 DIAGNOSIS — I10 Essential (primary) hypertension: Secondary | ICD-10-CM | POA: Diagnosis not present

## 2019-10-23 DIAGNOSIS — H40051 Ocular hypertension, right eye: Secondary | ICD-10-CM

## 2019-10-23 DIAGNOSIS — H3581 Retinal edema: Secondary | ICD-10-CM

## 2019-10-23 DIAGNOSIS — H35033 Hypertensive retinopathy, bilateral: Secondary | ICD-10-CM

## 2019-10-23 DIAGNOSIS — Z961 Presence of intraocular lens: Secondary | ICD-10-CM

## 2019-10-23 MED ORDER — AFLIBERCEPT 2MG/0.05ML IZ SOLN FOR KALEIDOSCOPE
2.0000 mg | INTRAVITREAL | Status: AC | PRN
  Administered 2019-10-23: 2 mg via INTRAVITREAL

## 2019-10-31 ENCOUNTER — Encounter (INDEPENDENT_AMBULATORY_CARE_PROVIDER_SITE_OTHER): Payer: BC Managed Care – PPO | Admitting: Ophthalmology

## 2019-11-21 ENCOUNTER — Encounter (INDEPENDENT_AMBULATORY_CARE_PROVIDER_SITE_OTHER): Payer: BC Managed Care – PPO | Admitting: Ophthalmology

## 2019-11-23 ENCOUNTER — Encounter (INDEPENDENT_AMBULATORY_CARE_PROVIDER_SITE_OTHER): Payer: BC Managed Care – PPO | Admitting: Ophthalmology

## 2019-11-23 DIAGNOSIS — H35033 Hypertensive retinopathy, bilateral: Secondary | ICD-10-CM

## 2019-11-23 DIAGNOSIS — E113413 Type 2 diabetes mellitus with severe nonproliferative diabetic retinopathy with macular edema, bilateral: Secondary | ICD-10-CM

## 2019-11-23 DIAGNOSIS — Z961 Presence of intraocular lens: Secondary | ICD-10-CM

## 2019-11-23 DIAGNOSIS — H40051 Ocular hypertension, right eye: Secondary | ICD-10-CM

## 2019-11-23 DIAGNOSIS — D3132 Benign neoplasm of left choroid: Secondary | ICD-10-CM

## 2019-11-23 DIAGNOSIS — I10 Essential (primary) hypertension: Secondary | ICD-10-CM

## 2019-11-23 DIAGNOSIS — H3581 Retinal edema: Secondary | ICD-10-CM

## 2019-11-26 NOTE — Progress Notes (Signed)
[facility] Note  11/30/2019     CHIEF COMPLAINT Patient presents for Retina Follow Up   HISTORY OF PRESENT ILLNESS: Juan [name] is a 59 y.o. male who presents to the clinic today for:   HPI    Retina Follow Up    Patient presents with  Diabetic Retinopathy.  In both eyes.  This started 4 weeks ago.  I, the attending physician,  performed the HPI with the patient and updated documentation appropriately.          Comments    Patient here for 4 weeks retina follow up for NPDR OU. Patient states vision about the same. No eye pain.        Last edited by Bernarda Caffey, MD on 11/30/2019  9:08 AM. (History)    pt states no change in vision  Referring physician: Harlan Stains, MD [place],  [place] [id number]  HISTORICAL INFORMATION:   Selected notes from the MEDICAL RECORD NUMBER Referred by Dr. Rutherford Guys for concern of CME s/p cataract sx   CURRENT MEDICATIONS: Current Outpatient Medications (Ophthalmic Drugs)  Medication Sig   dorzolamide-timolol (COSOPT) 22.3-6.8 MG/ML ophthalmic solution Place 1 drop into both eyes 2 (two) times daily.   ketorolac (ACULAR) 0.5 % ophthalmic solution Place 1 drop into both eyes 4 (four) times daily. (Patient taking differently: Place 1 drop into both eyes in the morning and at bedtime. )   prednisoLONE acetate (PRED FORTE) 1 % ophthalmic suspension Place 1 drop into both eyes 4 (four) times daily.   No current facility-administered medications for this visit. (Ophthalmic Drugs)   Current Outpatient Medications (Other)  Medication Sig   ADVAIR DISKUS 500-50 MCG/DOSE AEPB Inhale 1 puff into the lungs 2 (two) times daily.   albuterol (PROVENTIL HFA;VENTOLIN HFA) 108 (90 BASE) MCG/ACT inhaler Inhale 1-2 puffs into the lungs every 6 (six) hours as needed for wheezing or shortness of breath.    alfuzosin (UROXATRAL) 10 MG 24 hr tablet TAKE 1 TABLET BY MOUTH TWICE A DAY    aspirin EC 81 MG tablet Take 81 mg by mouth daily.   atorvastatin (LIPITOR) 40 MG tablet Take 40 mg by mouth daily.   carvedilol (COREG) 6.25 MG tablet Take 1 tablet (6.25 mg total) by mouth 2 (two) times daily with a meal.   Empagliflozin-metFORMIN HCl (SYNJARDY) 12.06-998 MG TABS Take 1 tablet by mouth 2 (two) times a day.    esomeprazole (NEXIUM) 40 MG capsule Take 40 mg by mouth daily at 12 noon.   insulin glargine, 2 Unit Dial, (TOUJEO MAX SOLOSTAR) 300 UNIT/ML Solostar Pen Inject 40 Units into the skin daily.    iron polysaccharides (NIFEREX) 150 MG capsule Take 150 mg by mouth daily.   lisinopril (ZESTRIL) 40 MG tablet Take 40 mg by mouth daily.   pantoprazole (PROTONIX) 40 MG tablet Take 40 mg by mouth daily.   predniSONE (DELTASONE) 20 MG tablet Take by mouth.   Semaglutide,0.25 or 0.5MG /DOS, (OZEMPIC, 0.25 OR 0.5 MG/DOSE,) 2 MG/1.5ML SOPN Inject into the skin.   Tiotropium Bromide Monohydrate (SPIRIVA RESPIMAT) 2.5 MCG/ACT AERS Inhale 2 puffs into the lungs daily.   No current facility-administered medications for this visit. (Other)      REVIEW OF SYSTEMS: ROS    Positive for: Genitourinary, Musculoskeletal, Endocrine, Eyes, Respiratory   Negative for: Constitutional, Gastrointestinal, Neurological, Skin, HENT, Cardiovascular, Psychiatric, Allergic/Imm, Heme/Lymph   Last edited by Theodore Demark, COA on 11/30/2019  8:14 AM. (History)       ALLERGIES Allergies  Allergen Reactions   Naproxen Shortness Of Breath   Naproxen Sodium Shortness Of Breath   Penicillins Rash    Has patient had a PCN reaction causing immediate rash, facial/tongue/throat swelling, SOB or lightheadedness with hypotension: Yes Has patient had a PCN reaction causing severe rash involving mucus membranes or skin necrosis: No Has patient had a PCN reaction that required hospitalization No Has patient had a PCN reaction occurring within the last 10 years: No If all of the above answers  are "NO", then may proceed with Cephalosporin use.  Has patient had a PCN reaction causing immediate rash, facial/tongue/throat swelling, SOB or lightheadedness with hypotension: Yes Has patient had a PCN reaction causing severe rash involving mucus membranes or skin necrosis: No Has patient had a PCN reaction that required hospitalization No Has patient had a PCN reaction occurring within the last 10 years: No If all of the above answers are "NO", then may proceed with Cephalosporin use.     PAST MEDICAL HISTORY Past Medical History:  Diagnosis Date   Arthritis    Asthma    COPD (chronic obstructive pulmonary disease) ([place])    Diabetes mellitus    Type 2   Diabetic retinopathy ([place])    NPDR OU   GERD (gastroesophageal reflux disease)    H/O hiatal hernia    History of kidney stones    Hyperlipemia    Hypertension    Hypertensive retinopathy    OU   Nasal polyps    Pneumonia 2014   Shortness of breath    Sleep apnea    pt has CPAP but is unable to use it d/t having polyps in his nose. Pt stated "I need to call and get a CPAP mask instead"   Stomach ulcer    Past Surgical History:  Procedure Laterality Date   CATARACT EXTRACTION Right 07/05/2018   Dr. Gershon Crane   CATARACT EXTRACTION Left 07/12/2018   Dr. Gershon Crane   CHOLECYSTECTOMY N/A 01/23/2015   Procedure: LAPAROSCOPIC CHOLECYSTECTOMY ;  Surgeon: Georganna Skeans, MD;  Location: [facility];  Service: General;  Laterality: N/A;   COLONOSCOPY W/ POLYPECTOMY     ESOPHAGOGASTRODUODENOSCOPY     EYE SURGERY     KNEE ARTHROSCOPY  03/24/2011   Procedure: ARTHROSCOPY KNEE;  Surgeon: Alta Corning, MD;  Location: [facility];  Service: Orthopedics;  Laterality: Right;  partial medial menisectomy, chondroplaty patella, and medial medial plica   LEFT HEART CATH AND CORONARY ANGIOGRAPHY N/A 06/29/2019   Procedure: LEFT HEART CATH AND CORONARY ANGIOGRAPHY;  Surgeon: Wellington Hampshire, MD;  Location: [facility];  Service: Cardiovascular;  Laterality: N/A;    FAMILY HISTORY Family History  Problem Relation Age of Onset   Diabetes Father     SOCIAL HISTORY Social History   Tobacco Use   Smoking status: Former Smoker    Packs/day: 3.00    Years: 20.00    Pack years: 60.00    Types: Cigarettes   Smokeless tobacco: Current User    Types: Chew   Tobacco comment: 1 can/daily  Substance Use Topics   Alcohol use: Yes    Comment: social, 1x weekly   Drug use: No         OPHTHALMIC EXAM:  Base Eye Exam    Visual Acuity (Snellen - Linear)      Right Left   Dist [place] 20/30 20/70 -1   Dist ph [place]  20/25 -2 20/60 -1       Tonometry (Tonopen, 8:12 AM)      Right Left   Pressure 12 14       Pupils      Dark Light Shape React APD   Right 3 2 Round Brisk None   Left 3 2 Round Brisk None       Visual Fields (Counting fingers)      Left Right    Full Full       Extraocular Movement      Right Left    Full, Ortho Full, Ortho       Neuro/Psych    Oriented x3: Yes   Mood/Affect: Normal       Dilation    Both eyes: 1.0% Mydriacyl, 2.5% Phenylephrine @ 8:12 AM        Slit Lamp and Fundus Exam    Slit Lamp Exam      Right Left   Lids/Lashes Dermatochalasis - upper lid, Telangiectasia, mild Meibomian gland dysfunction Dermatochalasis - upper lid, Telangiectasia, mild Meibomian gland dysfunction   Conjunctiva/Sclera White and quiet, +concretions on palpebral conj Mild nasal Pinguecula, +concretions on palpebral conj   Cornea Mild Arcus, Well healed temporal cataract wounds Arcus, 1+ Descemet's folds, Well healed temporal cataract wounds   Anterior Chamber deep, 1+ cell/pigment deep, 1+ cell/pigment   Iris Round and dilated, mild patch of atrophy at 0800, no NVI Round and dilated, No NVI   Lens PC IOL in good position PC IOL in good position   Vitreous Vitreous syneresis Vitreous syneresis       Fundus Exam      Right Left   Disc Pink and Sharp Pallor,  Sharp rim   C/D Ratio 0.3 0.3   Macula Blunted foveal reflex, scattered IRH/DBH/exudates -- greatest temporal macula Blunted foveal reflex, +central edema -- slightly increased, scattered DBH and MAs, scattered puncatate exudates - greatest superior and temporal macula - improving   Vessels Vascular attenuation, Tortuous Vascular attenuation, Tortuous   Periphery Attached, scattered MA and exudates greatest posteriorly Attached, pigmented choroidal nevus superiorly with mild elevation, +drusen, no SRF          IMAGING AND PROCEDURES  Imaging and Procedures for @TODAY @  OCT, Retina - OU - Both Eyes       Right Eye Quality was good. Central Foveal Thickness: 286. Progression has worsened. Findings include abnormal foveal contour, intraretinal hyper-reflective material, intraretinal fluid, outer retinal atrophy, no SRF (persistent IRF - slightly increased).   Left Eye Quality was good. Central Foveal Thickness: 288. Progression has worsened. Findings include intraretinal fluid, intraretinal hyper-reflective material, abnormal foveal contour, no SRF (Mild interval increase in IRF, Sub-RPE hyper-reflective mass consistent with choroidal nevus--superior to disc, caught on widefield -- stable from prior).   Notes *Images captured and stored on drive  Diagnosis / Impression: Severe DME OU OD: persistent IRF - slightly increased OS: interval increase in IRF/IRHM, Sub-RPE hyper-reflective mass consistent with choroidal nevus--superior to disc, caught on widefield -- stable from prior   Clinical management:  See below  Abbreviations: NFP - Normal foveal profile. CME - cystoid macular edema. PED - pigment epithelial detachment. IRF - intraretinal fluid. SRF - subretinal fluid. EZ - ellipsoid zone. ERM - epiretinal membrane. ORA - outer retinal atrophy. ORT - outer retinal tubulation. SRHM - subretinal hyper-reflective material        Intravitreal Injection, Pharmacologic Agent - OD -  Right Eye       Time Out  11/30/2019. 8:31 AM. Confirmed correct patient, procedure, site, and patient consented.   Anesthesia Topical anesthesia was used. Anesthetic medications included Lidocaine 2%, Proparacaine 0.5%.   Procedure Preparation included eyelid speculum, 5% betadine to ocular surface. A (32g) needle was used.   Injection:  2 mg aflibercept Alfonse Flavors) SOLN   NDC: A3590391, Lot: [id number], Expiration date: 02/02/2020   Route: Intravitreal, Site: Right Eye, Waste: 0.05 mL  Post-op Post injection exam found visual acuity of at least counting fingers. The patient tolerated the procedure well. There were no complications. The patient received written and verbal post procedure care education. Post injection medications were not given.        Intravitreal Injection, Pharmacologic Agent - OS - Left Eye       Time Out 11/30/2019. 8:31 AM. Confirmed correct patient, procedure, site, and patient consented.   Anesthesia Topical anesthesia was used. Anesthetic medications included Lidocaine 2%, Proparacaine 0.5%.   Procedure Preparation included 5% betadine to ocular surface, eyelid speculum. A (32g) needle was used.   Injection:  2 mg aflibercept Alfonse Flavors) SOLN   NDC: M7179715, Lot: [id number], Expiration date: 12/02/2020   Route: Intravitreal, Site: Left Eye, Waste: 0.05 mL  Post-op Post injection exam found visual acuity of at least counting fingers. The patient tolerated the procedure well. There were no complications. The patient received written and verbal post procedure care education. Post injection medications were not given.                 ASSESSMENT/PLAN:    ICD-10-CM   1. Severe nonproliferative diabetic retinopathy of both eyes with macular edema associated with type 2 diabetes mellitus (HCC)  V25.3664 Intravitreal Injection, Pharmacologic Agent - OD - Right Eye    Intravitreal Injection, Pharmacologic Agent - OS - Left Eye    aflibercept  (EYLEA) SOLN 2 mg    aflibercept (EYLEA) SOLN 2 mg  2. 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 Essential hypertension  I10   5. Hypertensive retinopathy of both eyes  H35.033   6. Pseudophakia of both eyes  Z96.1   7. Ocular hypertension of right eye  H40.051     1. Severe nonproliferative diabetic retinopathy with DME, OU (OS>OD)  - s/p IVA OD #1 (06.26.20), #2 (08.14.20), #3 (09.17.20), #4 (10.16.20)  - s/p IVA OS #1 (07.17.20), #2 (08.14.20), #3 (09.17.20)  - s/p IVA OU (08.14.20), #3 (09.17.20)  - s/p IVE OS #1 (10.16.20) - sample, #2 (11.13.20), #3 (12.11.20), #4 (01.18.21), #5 (02.19.21), #6 (03.19.21), #7 (04.16.21), #8 (05.14.21), #9 (06.17.21), #10 (7.16.21), #11 (09.21.21)  - s/p IVE OD #1 (11.13.20), #2 (12.11.20), #3 (01.18.21), #4 (02.19.21), #5 (03.19.21), #6 (04.16.21), #7 (05.14.21), #8 (06.17.21), #9 (07.16.21), #10 (09.21.21)  - FA (06.26.20) shows Severe NPDR OU w/ Late leaking MA OU, Enlarged FAZ OU, No NV OU, No significant hyperfluorescence of disc  - BCVA: OD 20/25 - stable; OS stable at 20/60  - OCT shows mild interval increase in IRF OU  - recommend IVE OD #11 and IVE #12 OS today, 10.29.21  - pt wishes to proceed  - RBA of procedure discussed, questions answered  - informed consent obtained  - Eylea informed consent form signed and scanned on 01.18.2021  - see procedure note  - [email or web address] Benefits Investigation initiated 10.16.2020 -- approved for 2021  - f/u in 4 weeks -- DFE/OCT/possible injection OU  2. Retinal Edema OU  -  based on FA, suspect majority of macular edema due to DM as above, but there may be some edema secondary to post-op CME  - cont PF qid OU and ketorolac qid OU   - history of difficulty with compliance at work  - monitor  - f/u 4 wks  3. Choroidal Nevus OS  - located at 1200, mild elevation, +drusen, no SRF  - baseline optos pictures obtained, 06.26.20  - discussed possible  referral [facility] for evaluation/management  4,5.Hypertensive retinopathy OU  - discussed importance of tight BP control  - monitor  6. Pseudophakia OU  - s/p CE/IOL OU (OD on 06.03.20 and OS 06.10.20 by Dr. Gershon Crane)  - beautiful surgeries w/ IOLs in excellent position  - macular edema limiting vision as above  - monitor  7. Ocular hypertension OD  - IOP 12 OD  - cont Cosopt BID OD  - monitor   Ophthalmic Meds Ordered this visit:  Meds ordered this encounter  Medications   aflibercept (EYLEA) SOLN 2 mg   aflibercept (EYLEA) SOLN 2 mg      Return in about 4 weeks (around 12/28/2019) for f/u NPDR OU, DFE, OCT.  There are no Patient Instructions on file for this visit.   Explained the diagnoses, plan, and follow up with the patient and they expressed understanding.  Patient expressed understanding of the importance of proper follow up care.   This document serves as a record of services personally performed by Gardiner Sleeper, MD, PhD. It was created on their behalf by San Jetty. Owens Shark, OA an ophthalmic technician. The creation of this record is the provider's dictation and/or activities during the visit.    Electronically signed by: San Jetty. Owens Shark, [place] 10.25.2021 10:46 AM  Gardiner Sleeper, M.D., Ph.D. Diseases & Surgery of the Retina and Vitreous [facility]  I have reviewed the above documentation for accuracy and completeness, and I agree with the above. Gardiner Sleeper, M.D., Ph.D. 11/30/19 10:46 AM   Abbreviations: M myopia (nearsighted); A astigmatism; H hyperopia (farsighted); P presbyopia; Mrx spectacle prescription;  CTL contact lenses; OD right eye; OS left eye; OU both eyes  XT exotropia; ET esotropia; PEK punctate epithelial keratitis; PEE punctate epithelial erosions; DES dry eye syndrome; MGD meibomian gland dysfunction; ATs artificial tears; PFAT's preservative free artificial tears; [place] nuclear sclerotic  cataract; PSC posterior subcapsular cataract; ERM epi-retinal membrane; PVD posterior vitreous detachment; RD retinal detachment; DM diabetes mellitus; DR diabetic retinopathy; NPDR non-proliferative diabetic retinopathy; PDR proliferative diabetic retinopathy; CSME clinically significant macular edema; DME diabetic macular edema; dbh dot blot hemorrhages; CWS cotton wool spot; POAG primary open angle glaucoma; C/D cup-to-disc ratio; HVF humphrey visual field; GVF goldmann visual field; OCT optical coherence tomography; IOP intraocular pressure; BRVO Branch retinal vein occlusion; CRVO central retinal vein occlusion; CRAO central retinal artery occlusion; BRAO branch retinal artery occlusion; RT retinal tear; SB scleral buckle; PPV pars plana vitrectomy; VH Vitreous hemorrhage; PRP panretinal laser photocoagulation; IVK intravitreal kenalog; VMT vitreomacular traction; MH Macular hole;  NVD neovascularization of the disc; NVE neovascularization elsewhere; AREDS age related eye disease study; ARMD age related macular degeneration; POAG primary open angle glaucoma; EBMD epithelial/anterior basement membrane dystrophy; ACIOL anterior chamber intraocular lens; IOL intraocular lens; PCIOL posterior chamber intraocular lens; Phaco/IOL phacoemulsification with intraocular lens placement; [place] photorefractive keratectomy; LASIK laser assisted in situ keratomileusis; HTN hypertension; DM diabetes mellitus; COPD chronic obstructive pulmonary disease

## 2019-11-30 ENCOUNTER — Ambulatory Visit (INDEPENDENT_AMBULATORY_CARE_PROVIDER_SITE_OTHER): Payer: BC Managed Care – PPO | Admitting: Ophthalmology

## 2019-11-30 ENCOUNTER — Encounter (INDEPENDENT_AMBULATORY_CARE_PROVIDER_SITE_OTHER): Payer: Self-pay | Admitting: Ophthalmology

## 2019-11-30 ENCOUNTER — Other Ambulatory Visit: Payer: Self-pay

## 2019-11-30 DIAGNOSIS — E113413 Type 2 diabetes mellitus with severe nonproliferative diabetic retinopathy with macular edema, bilateral: Secondary | ICD-10-CM

## 2019-11-30 DIAGNOSIS — D3132 Benign neoplasm of left choroid: Secondary | ICD-10-CM | POA: Diagnosis not present

## 2019-11-30 DIAGNOSIS — I10 Essential (primary) hypertension: Secondary | ICD-10-CM | POA: Diagnosis not present

## 2019-11-30 DIAGNOSIS — H40051 Ocular hypertension, right eye: Secondary | ICD-10-CM

## 2019-11-30 DIAGNOSIS — H3581 Retinal edema: Secondary | ICD-10-CM | POA: Diagnosis not present

## 2019-11-30 DIAGNOSIS — H35033 Hypertensive retinopathy, bilateral: Secondary | ICD-10-CM

## 2019-11-30 DIAGNOSIS — Z961 Presence of intraocular lens: Secondary | ICD-10-CM

## 2019-11-30 MED ORDER — AFLIBERCEPT 2MG/0.05ML IZ SOLN FOR KALEIDOSCOPE
2.0000 mg | INTRAVITREAL | Status: AC | PRN
  Administered 2019-11-30: 2 mg via INTRAVITREAL

## 2019-12-20 NOTE — Progress Notes (Signed)
[facility] Note  12/31/2019     CHIEF COMPLAINT Patient presents for Retina Follow Up   HISTORY OF PRESENT ILLNESS: Juan [name] is a 59 y.o. male who presents to the clinic today for:   HPI    Retina Follow Up    Patient presents with  Diabetic Retinopathy.  In both eyes.  This started months ago.  Severity is moderate.  Duration of 4.5 weeks.  Since onset it is stable.  I, the attending physician,  performed the HPI with the patient and updated documentation appropriately.          Comments    59 y/o male pt here for 4.5 wk f/u for severe NPDR w/DME OU.  No change in [place] OU noticed.  Denies pain, FOL, floaters.  BS 119 several days ago.  A1C unknown.  Cosopt BID OU.       Last edited by Bernarda Caffey, MD on 12/31/2019  9:54 PM. (History)    pt states  Referring physician: Harlan Stains, MD [place],  [place] [address]  HISTORICAL INFORMATION:   Selected notes from the MEDICAL RECORD NUMBER Referred by Dr. Rutherford Guys for concern of CME s/p cataract sx   CURRENT MEDICATIONS: Current Outpatient Medications (Ophthalmic Drugs)  Medication Sig  . dorzolamide-timolol (COSOPT) 22.3-6.8 MG/ML ophthalmic solution Place 1 drop into both eyes 2 (two) times daily.  Marland Kitchen ketorolac (ACULAR) 0.5 % ophthalmic solution Place 1 drop into both eyes 4 (four) times daily. (Patient not taking: Reported on 12/31/2019)  . prednisoLONE acetate (PRED FORTE) 1 % ophthalmic suspension Place 1 drop into both eyes 4 (four) times daily. (Patient not taking: Reported on 12/31/2019)   No current facility-administered medications for this visit. (Ophthalmic Drugs)   Current Outpatient Medications (Other)  Medication Sig  . ADVAIR DISKUS 500-50 MCG/DOSE AEPB Inhale 1 puff into the lungs 2 (two) times daily.  Marland Kitchen albuterol (PROVENTIL HFA;VENTOLIN HFA) 108 (90 BASE) MCG/ACT inhaler Inhale 1-2 puffs into the lungs every 6 (six) hours as needed for  wheezing or shortness of breath.   . alfuzosin (UROXATRAL) 10 MG 24 hr tablet TAKE 1 TABLET BY MOUTH TWICE A DAY  . aspirin EC 81 MG tablet Take 81 mg by mouth daily.  Marland Kitchen atorvastatin (LIPITOR) 40 MG tablet Take 40 mg by mouth daily.  . carvedilol (COREG) 6.25 MG tablet Take 1 tablet (6.25 mg total) by mouth 2 (two) times daily with a meal.  . Empagliflozin-metFORMIN HCl (SYNJARDY) 12.06-998 MG TABS Take 1 tablet by mouth 2 (two) times a day.   . esomeprazole (NEXIUM) 40 MG capsule Take 40 mg by mouth daily at 12 noon.  . insulin glargine, 2 Unit Dial, (TOUJEO MAX SOLOSTAR) 300 UNIT/ML Solostar Pen Inject 40 Units into the skin daily.   . iron polysaccharides (NIFEREX) 150 MG capsule Take 150 mg by mouth daily.  Marland Kitchen lisinopril (ZESTRIL) 40 MG tablet Take 40 mg by mouth daily.  . pantoprazole (PROTONIX) 40 MG tablet Take 40 mg by mouth daily.  . predniSONE (DELTASONE) 20 MG tablet Take by mouth.  . Semaglutide,0.25 or 0.5MG /DOS, (OZEMPIC, 0.25 OR 0.5 MG/DOSE,) 2 MG/1.5ML SOPN Inject into the skin.  . Tiotropium Bromide Monohydrate (SPIRIVA RESPIMAT) 2.5 MCG/ACT AERS Inhale 2 puffs into the lungs daily.   No current facility-administered medications for this visit. (Other)      REVIEW OF SYSTEMS: ROS    Positive for: Endocrine, Eyes   Negative  for: Constitutional, Gastrointestinal, Neurological, Skin, Genitourinary, Musculoskeletal, HENT, Cardiovascular, Respiratory, Psychiatric, Allergic/Imm, Heme/Lymph   Last edited by Matthew Folks, COA on 12/31/2019  3:05 PM. (History)       ALLERGIES Allergies  Allergen Reactions  . Naproxen Shortness Of Breath  . Naproxen Sodium Shortness Of Breath  . Penicillins Rash    Has patient had a PCN reaction causing immediate rash, facial/tongue/throat swelling, SOB or lightheadedness with hypotension: Yes Has patient had a PCN reaction causing severe rash involving mucus membranes or skin necrosis: No Has patient had a PCN reaction that required  hospitalization No Has patient had a PCN reaction occurring within the last 10 years: No If all of the above answers are "NO", then may proceed with Cephalosporin use.  Has patient had a PCN reaction causing immediate rash, facial/tongue/throat swelling, SOB or lightheadedness with hypotension: Yes Has patient had a PCN reaction causing severe rash involving mucus membranes or skin necrosis: No Has patient had a PCN reaction that required hospitalization No Has patient had a PCN reaction occurring within the last 10 years: No If all of the above answers are "NO", then may proceed with Cephalosporin use.     PAST MEDICAL HISTORY Past Medical History:  Diagnosis Date  . Arthritis   . Asthma   . COPD (chronic obstructive pulmonary disease) ([place])   . Diabetes mellitus    Type 2  . Diabetic retinopathy ([place])    NPDR OU  . GERD (gastroesophageal reflux disease)   . H/O hiatal hernia   . History of kidney stones   . Hyperlipemia   . Hypertension   . Hypertensive retinopathy    OU  . Nasal polyps   . Pneumonia 2014  . Shortness of breath   . Sleep apnea    pt has CPAP but is unable to use it d/t having polyps in his nose. Pt stated "I need to call and get a CPAP mask instead"  . Stomach ulcer    Past Surgical History:  Procedure Laterality Date  . CATARACT EXTRACTION Right 07/05/2018   Dr. Gershon Crane  . CATARACT EXTRACTION Left 07/12/2018   Dr. Gershon Crane  . CHOLECYSTECTOMY N/A 01/23/2015   Procedure: LAPAROSCOPIC CHOLECYSTECTOMY ;  Surgeon: Georganna Skeans, MD;  Location: [facility];  Service: General;  Laterality: N/A;  . COLONOSCOPY W/ POLYPECTOMY    . ESOPHAGOGASTRODUODENOSCOPY    . EYE SURGERY    . KNEE ARTHROSCOPY  03/24/2011   Procedure: ARTHROSCOPY KNEE;  Surgeon: Alta Corning, MD;  Location: [facility];  Service: Orthopedics;  Laterality: Right;  partial medial menisectomy, chondroplaty patella, and medial medial plica  . LEFT HEART CATH AND CORONARY ANGIOGRAPHY  N/A 06/29/2019   Procedure: LEFT HEART CATH AND CORONARY ANGIOGRAPHY;  Surgeon: Wellington Hampshire, MD;  Location: [facility];  Service: Cardiovascular;  Laterality: N/A;    FAMILY HISTORY Family History  Problem Relation Age of Onset  . Diabetes Father     SOCIAL HISTORY Social History   Tobacco Use  . Smoking status: Former Smoker    Packs/day: 3.00    Years: 20.00    Pack years: 60.00    Types: Cigarettes  . Smokeless tobacco: Current User    Types: Chew  . Tobacco comment: 1 can/daily  Substance Use Topics  . Alcohol use: Yes    Comment: social, 1x weekly  . Drug use: No         OPHTHALMIC EXAM:  Base Eye Exam  Visual Acuity (Snellen - Linear)      Right Left   Dist [place] 20/25 -2 20/60   Dist ph [place] 20/20 -2 20/50 +2       Tonometry (Tonopen, 3:10 PM)      Right Left   Pressure 13 15       Pupils      Dark Light Shape React APD   Right 3 2 Round Brisk None   Left 3 2 Round Brisk None       Visual Fields (Counting fingers)      Left Right    Full Full       Extraocular Movement      Right Left    Full, Ortho Full, Ortho       Neuro/Psych    Oriented x3: Yes   Mood/Affect: Normal       Dilation    Both eyes: 1.0% Mydriacyl, 2.5% Phenylephrine @ 3:10 PM        Slit Lamp and Fundus Exam    Slit Lamp Exam      Right Left   Lids/Lashes Dermatochalasis - upper lid, Telangiectasia, mild Meibomian gland dysfunction Dermatochalasis - upper lid, Telangiectasia, mild Meibomian gland dysfunction   Conjunctiva/Sclera White and quiet, +concretions on palpebral conj Mild nasal Pinguecula, +concretions on palpebral conj   Cornea Mild Arcus, Well healed temporal cataract wounds Arcus, 1+ Descemet's folds, Well healed temporal cataract wounds   Anterior Chamber deep, 1+ cell/pigment deep, 1+ cell/pigment   Iris Round and dilated, mild patch of atrophy at 0800, no NVI Round and dilated, No NVI   Lens PC IOL in good position PC IOL in good position    Vitreous Vitreous syneresis Vitreous syneresis       Fundus Exam      Right Left   Disc Pink and Sharp Pallor, Sharp rim   C/D Ratio 0.3 0.3   Macula Blunted foveal reflex, scattered IRH/DBH/exudates -- greatest temporal macula -- slightly improved Blunted foveal reflex, +central edema -- slightly improved, scattered DBH and MAs, scattered puncatate exudates - greatest superior and temporal macula - improving   Vessels Vascular attenuation, Tortuous Vascular attenuation, Tortuous   Periphery Attached, scattered MA and exudates greatest posteriorly Attached, pigmented choroidal nevus superiorly with mild elevation, +drusen, no SRF -- unchanged from prior          IMAGING AND PROCEDURES  Imaging and Procedures for @TODAY @  OCT, Retina - OU - Both Eyes       Right Eye Quality was good. Central Foveal Thickness: 279. Progression has improved. Findings include abnormal foveal contour, intraretinal hyper-reflective material, intraretinal fluid, outer retinal atrophy, no SRF (Interval improvement in IRF/IRHM).   Left Eye Quality was good. Central Foveal Thickness: 279. Progression has improved. Findings include intraretinal fluid, intraretinal hyper-reflective material, abnormal foveal contour, no SRF (Interval improvement in IRF/IRHM, Sub-RPE hyper-reflective mass consistent with choroidal nevus--superior to disc, caught on widefield -- stable from prior).   Notes *Images captured and stored on drive  Diagnosis / Impression: Severe DME OU OD: Interval improvement in IRF/IRHM OS: Interval improvement in IRF/IRHM, Sub-RPE hyper-reflective mass consistent with choroidal nevus--superior to disc, caught on widefield -- stable from prior  Clinical management:  See below  Abbreviations: NFP - Normal foveal profile. CME - cystoid macular edema. PED - pigment epithelial detachment. IRF - intraretinal fluid. SRF - subretinal fluid. EZ - ellipsoid zone. ERM - epiretinal membrane. ORA - outer  retinal atrophy. ORT - outer retinal tubulation. SRHM -  subretinal hyper-reflective material        Intravitreal Injection, Pharmacologic Agent - OD - Right Eye       Time Out 12/31/2019. 4:32 PM. Confirmed correct patient, procedure, site, and patient consented.   Anesthesia Topical anesthesia was used. Anesthetic medications included Lidocaine 2%, Proparacaine 0.5%.   Procedure Preparation included eyelid speculum, 5% betadine to ocular surface. A (32g) needle was used.   Injection:  2 mg aflibercept Alfonse Flavors) SOLN   NDC: A3590391, Lot: [id number], Expiration date: 03/31/2020   Route: Intravitreal, Site: Right Eye, Waste: 0.05 mL  Post-op Post injection exam found visual acuity of at least counting fingers. The patient tolerated the procedure well. There were no complications. The patient received written and verbal post procedure care education. Post injection medications were not given.        Intravitreal Injection, Pharmacologic Agent - OS - Left Eye       Time Out 12/31/2019. 4:33 PM. Confirmed correct patient, procedure, site, and patient consented.   Anesthesia Topical anesthesia was used. Anesthetic medications included Lidocaine 2%, Proparacaine 0.5%.   Procedure Preparation included 5% betadine to ocular surface, eyelid speculum. A (32g) needle was used.   Injection:  2 mg aflibercept Alfonse Flavors) SOLN   NDC: A3590391, Lot: [id number], Expiration date: 03/31/2020   Route: Intravitreal, Site: Left Eye, Waste: 0.05 mL  Post-op Post injection exam found visual acuity of at least counting fingers. The patient tolerated the procedure well. There were no complications. The patient received written and verbal post procedure care education. Post injection medications were not given.                 ASSESSMENT/PLAN:    ICD-10-CM   1. Severe nonproliferative diabetic retinopathy of both eyes with macular edema associated with type 2 diabetes mellitus (HCC)   Q33.0076 Intravitreal Injection, Pharmacologic Agent - OD - Right Eye    Intravitreal Injection, Pharmacologic Agent - OS - Left Eye    aflibercept (EYLEA) SOLN 2 mg    aflibercept (EYLEA) SOLN 2 mg  2. 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 Essential hypertension  I10   5. Hypertensive retinopathy of both eyes  H35.033   6. Pseudophakia of both eyes  Z96.1   7. Ocular hypertension of right eye  H40.051     1. Severe nonproliferative diabetic retinopathy with DME, OU (OS>OD)  - s/p IVA OD #1 (06.26.20), #2 (08.14.20), #3 (09.17.20), #4 (10.16.20)  - s/p IVA OS #1 (07.17.20), #2 (08.14.20), #3 (09.17.20)  - s/p IVA OU (08.14.20), #3 (09.17.20)  - s/p IVE OS #1 (10.16.20) - sample, #2 (11.13.20), #3 (12.11.20), #4 (01.18.21), #5 (02.19.21), #6 (03.19.21), #7 (04.16.21), #8 (05.14.21), #9 (06.17.21), #10 (7.16.21), #11 (09.21.21), #12 (10.29.21)  - s/p IVE OD #1 (11.13.20), #2 (12.11.20), #3 (01.18.21), #4 (02.19.21), #5 (03.19.21), #6 (04.16.21), #7 (05.14.21), #8 (06.17.21), #9 (07.16.21), #10 (09.21.21), #11 (10.29.21)  - FA (06.26.20) shows Severe NPDR OU w/ Late leaking MA OU, Enlarged FAZ OU, No NV OU, No significant hyperfluorescence of disc  - BCVA: OD 20/20 - improved from 20/25; OS improved to 20/50 from 20/60  - OCT shows interval improvement in IRF OU  - recommend IVE OD #12 and IVE #13 OS today, 11.29.21  - pt wishes to proceed  - RBA of procedure discussed, questions answered  - informed consent obtained  - Eylea informed consent form signed and scanned on  01.18.2021  - see procedure note  - Eylea4U Benefits Investigation initiated 10.16.2020 -- approved for 2021  - f/u in 4 weeks -- DFE/OCT/possible injection OU  2. Retinal Edema OU  - based on FA, suspect majority of macular edema due to DM as above, but there may be some edema secondary to post-op CME  - cont PF qid OU and ketorolac qid OU   - history of  difficulty with compliance at work  - monitor  - f/u 4 wks  3. Choroidal Nevus OS  - located at 1200, mild elevation, +drusen, no SRF  - baseline optos pictures obtained, 06.26.20  - discussed possible referral [facility] for evaluation/management  4,5.Hypertensive retinopathy OU  - discussed importance of tight BP control  - monitor  6. Pseudophakia OU  - s/p CE/IOL OU (OD on 06.03.20 and OS 06.10.20 by Dr. Gershon Crane)  - beautiful surgeries w/ IOLs in excellent position  - macular edema limiting vision as above  - monitor  7. Ocular hypertension OD  - IOP 13 OD  - cont Cosopt BID OD  - monitor   Ophthalmic Meds Ordered this visit:  Meds ordered this encounter  Medications  . aflibercept (EYLEA) SOLN 2 mg  . aflibercept (EYLEA) SOLN 2 mg      Return in about 4 weeks (around 01/28/2020) for f/u NPDR OU, DFE, OCT.  There are no Patient Instructions on file for this visit.  This document serves as a record of services personally performed by Gardiner Sleeper, MD, PhD. It was created on their behalf by Leeann Must, [place], an ophthalmic technician. The creation of this record is the provider's dictation and/or activities during the visit.    Electronically signed by: Leeann Must, [place] 11.18.2021 10:12 PM   This document serves as a record of services personally performed by Gardiner Sleeper, MD, PhD. It was created on their behalf by San Jetty. Owens Shark, OA an ophthalmic technician. The creation of this record is the provider's dictation and/or activities during the visit.    Electronically signed by: San Jetty. Owens Shark, [place] 11.29.2021 10:12 PM  Gardiner Sleeper, M.D., Ph.D. Diseases & Surgery of the Retina and [place] 12/31/2019   I have reviewed the above documentation for accuracy and completeness, and I agree with the above. Gardiner Sleeper, M.D., Ph.D. 12/31/19 10:12 PM   Abbreviations: M myopia  (nearsighted); A astigmatism; H hyperopia (farsighted); P presbyopia; Mrx spectacle prescription;  CTL contact lenses; OD right eye; OS left eye; OU both eyes  XT exotropia; ET esotropia; PEK punctate epithelial keratitis; PEE punctate epithelial erosions; DES dry eye syndrome; MGD meibomian gland dysfunction; ATs artificial tears; PFAT's preservative free artificial tears; [place] nuclear sclerotic cataract; PSC posterior subcapsular cataract; ERM epi-retinal membrane; PVD posterior vitreous detachment; RD retinal detachment; DM diabetes mellitus; DR diabetic retinopathy; NPDR non-proliferative diabetic retinopathy; PDR proliferative diabetic retinopathy; CSME clinically significant macular edema; DME diabetic macular edema; dbh dot blot hemorrhages; CWS cotton wool spot; POAG primary open angle glaucoma; C/D cup-to-disc ratio; HVF humphrey visual field; GVF goldmann visual field; OCT optical coherence tomography; IOP intraocular pressure; BRVO Branch retinal vein occlusion; CRVO central retinal vein occlusion; CRAO central retinal artery occlusion; BRAO branch retinal artery occlusion; RT retinal tear; SB scleral buckle; PPV pars plana vitrectomy; VH Vitreous hemorrhage; PRP panretinal laser photocoagulation; IVK intravitreal kenalog; VMT vitreomacular traction; MH Macular hole;  NVD neovascularization of the disc; NVE neovascularization elsewhere; AREDS  age related eye disease study; ARMD age related macular degeneration; POAG primary open angle glaucoma; EBMD epithelial/anterior basement membrane dystrophy; ACIOL anterior chamber intraocular lens; IOL intraocular lens; PCIOL posterior chamber intraocular lens; Phaco/IOL phacoemulsification with intraocular lens placement; [place] photorefractive keratectomy; LASIK laser assisted in situ keratomileusis; HTN hypertension; DM diabetes mellitus; COPD chronic obstructive pulmonary disease

## 2019-12-31 ENCOUNTER — Ambulatory Visit (INDEPENDENT_AMBULATORY_CARE_PROVIDER_SITE_OTHER): Payer: BC Managed Care – PPO | Admitting: Ophthalmology

## 2019-12-31 ENCOUNTER — Encounter (INDEPENDENT_AMBULATORY_CARE_PROVIDER_SITE_OTHER): Payer: Self-pay | Admitting: Ophthalmology

## 2019-12-31 ENCOUNTER — Other Ambulatory Visit: Payer: Self-pay

## 2019-12-31 DIAGNOSIS — I10 Essential (primary) hypertension: Secondary | ICD-10-CM | POA: Diagnosis not present

## 2019-12-31 DIAGNOSIS — Z961 Presence of intraocular lens: Secondary | ICD-10-CM

## 2019-12-31 DIAGNOSIS — E113413 Type 2 diabetes mellitus with severe nonproliferative diabetic retinopathy with macular edema, bilateral: Secondary | ICD-10-CM

## 2019-12-31 DIAGNOSIS — H40051 Ocular hypertension, right eye: Secondary | ICD-10-CM

## 2019-12-31 DIAGNOSIS — H3581 Retinal edema: Secondary | ICD-10-CM

## 2019-12-31 DIAGNOSIS — H35033 Hypertensive retinopathy, bilateral: Secondary | ICD-10-CM

## 2019-12-31 DIAGNOSIS — D3132 Benign neoplasm of left choroid: Secondary | ICD-10-CM

## 2019-12-31 MED ORDER — AFLIBERCEPT 2MG/0.05ML IZ SOLN FOR KALEIDOSCOPE
2.0000 mg | INTRAVITREAL | Status: AC | PRN
  Administered 2019-12-31: 2 mg via INTRAVITREAL

## 2020-01-03 ENCOUNTER — Ambulatory Visit
Admission: RE | Admit: 2020-01-03 | Discharge: 2020-01-03 | Disposition: A | Discharge status: Home / Self Care | Payer: BC Managed Care – PPO | Source: Ambulatory Visit | Attending: Emergency Medicine | Admitting: Emergency Medicine

## 2020-01-03 ENCOUNTER — Other Ambulatory Visit: Payer: Self-pay

## 2020-01-03 VITALS — BP 174/101 | HR 106 | Temp 98.8°F | Resp 20

## 2020-01-03 DIAGNOSIS — N390 Urinary tract infection, site not specified: Secondary | ICD-10-CM | POA: Diagnosis not present

## 2020-01-03 DIAGNOSIS — J441 Chronic obstructive pulmonary disease with (acute) exacerbation: Secondary | ICD-10-CM | POA: Insufficient documentation

## 2020-01-03 LAB — POCT URINALYSIS DIP (MANUAL ENTRY)
Bilirubin, UA: NEGATIVE
Glucose, UA: 500 mg/dL — AB
Ketones, POC UA: NEGATIVE mg/dL
Leukocytes, UA: NEGATIVE
Nitrite, UA: POSITIVE — AB
Protein Ur, POC: >=300 mg/dL — AB
Spec Grav, UA: 1.025 (ref 1.010–1.025)
Urobilinogen, UA: 0.2 E.U./dL
pH, UA: 7 (ref 5.0–8.0)

## 2020-01-03 MED ORDER — SULFAMETHOXAZOLE-TRIMETHOPRIM 800-160 MG PO TABS: 1.0000 | ORAL_TABLET | Freq: Two times a day (BID) | ORAL | 0 refills | Status: AC

## 2020-01-03 MED ORDER — BENZONATATE 200 MG PO CAPS: 200.0000 mg | ORAL_CAPSULE | Freq: Three times a day (TID) | ORAL | 0 refills | Status: AC | PRN

## 2020-01-03 MED ORDER — ALBUTEROL SULFATE HFA 108 (90 BASE) MCG/ACT IN AERS
1.0000 | INHALATION_SPRAY | Freq: Four times a day (QID) | RESPIRATORY_TRACT | 0 refills | Status: AC | PRN
Start: 2020-01-03 — End: ?

## 2020-01-03 MED ORDER — PREDNISONE 20 MG PO TABS: 40.0000 mg | ORAL_TABLET | Freq: Every day | ORAL | 0 refills | Status: DC

## 2020-01-03 NOTE — ED Triage Notes (Signed)
Pt c/o lower abdominal pain with burning on urination and urgency/frequency since last Sunday. Pt states has had a cough and chest tightness this week and took a neb tx last night with relief.

## 2020-01-03 NOTE — ED Provider Notes (Signed)
[facility]    CSN: [id number] Arrival date & time: 01/03/20  1641      History   Chief Complaint Chief Complaint  Patient presents with  . appt 5- uti  . Cough    HPI Juan [name] is a 59 y.o. male history of tobacco use, DM type II, COPD, hypertension, hyperlipidemia, presenting today for evaluation of possible UTI and cough.  Reports that over the past 4 to 5 days he has had increased urinary frequency, dysuria.  Is also began to develop some lower back pain.  Denies any fever nausea or vomiting.  Using over-the-counter Azo and cranberry.  Has had 1 prior UTI as well as reports history of enlarged prostate.  Denies history of prostatitis.  Denies hematuria.  Also reports increased cough shortness of breath and some wheezing.  Reports chronic bronchitis, use nebulizer at home last night with improvement of symptoms.  Hoping to prevent symptoms from progressing and worsening.  Denies fevers.  Has had some mild URI symptoms.  Declines COVID testing.   HPI  Past Medical History:  Diagnosis Date  . Arthritis   . Asthma   . COPD (chronic obstructive pulmonary disease) ([place])   . Diabetes mellitus    Type 2  . Diabetic retinopathy ([place])    NPDR OU  . GERD (gastroesophageal reflux disease)   . H/O hiatal hernia   . History of kidney stones   . Hyperlipemia   . Hypertension   . Hypertensive retinopathy    OU  . Nasal polyps   . Pneumonia 2014  . Shortness of breath   . Sleep apnea    pt has CPAP but is unable to use it d/t having polyps in his nose. Pt stated "I need to call and get a CPAP mask instead"  . Stomach ulcer     Patient Active Problem List   Diagnosis Date Noted  . Dyspnea 09/24/2019  . Abnormal stress test   . Chest pain 02/28/2013  . HTN (hypertension) 02/28/2013  . DM2 (diabetes mellitus, type 2) ([place]) 02/28/2013  . Dyslipidemia 02/28/2013  . Hyposmolality and/or hyponatremia 02/28/2013  . Obesity, unspecified 02/28/2013  . Community  acquired pneumonia 02/28/2013    Past Surgical History:  Procedure Laterality Date  . CATARACT EXTRACTION Right 07/05/2018   Dr. Gershon Crane  . CATARACT EXTRACTION Left 07/12/2018   Dr. Gershon Crane  . CHOLECYSTECTOMY N/A 01/23/2015   Procedure: LAPAROSCOPIC CHOLECYSTECTOMY ;  Surgeon: Georganna Skeans, MD;  Location: [facility];  Service: General;  Laterality: N/A;  . COLONOSCOPY W/ POLYPECTOMY    . ESOPHAGOGASTRODUODENOSCOPY    . EYE SURGERY    . KNEE ARTHROSCOPY  03/24/2011   Procedure: ARTHROSCOPY KNEE;  Surgeon: Alta Corning, MD;  Location: [facility];  Service: Orthopedics;  Laterality: Right;  partial medial menisectomy, chondroplaty patella, and medial medial plica  . LEFT HEART CATH AND CORONARY ANGIOGRAPHY N/A 06/29/2019   Procedure: LEFT HEART CATH AND CORONARY ANGIOGRAPHY;  Surgeon: Wellington Hampshire, MD;  Location: [facility];  Service: Cardiovascular;  Laterality: N/A;       Home Medications    Prior to Admission medications   Medication Sig Start Date End Date Taking? Authorizing Provider  ADVAIR DISKUS 500-50 MCG/DOSE AEPB Inhale 1 puff into the lungs 2 (two) times daily. 01/09/15   [provider]  albuterol (VENTOLIN HFA) 108 (90 Base) MCG/ACT inhaler Inhale 1-2 puffs into the lungs every 6 (six) hours as needed for wheezing  or shortness of breath. 01/03/20   Sharhonda Atwood C, PA-C  alfuzosin (UROXATRAL) 10 MG 24 hr tablet TAKE 1 TABLET BY MOUTH TWICE A DAY 07/23/19   McKenzie, Candee Furbish, MD  aspirin EC 81 MG tablet Take 81 mg by mouth daily.    [provider]  atorvastatin (LIPITOR) 40 MG tablet Take 40 mg by mouth daily.    [provider]  benzonatate (TESSALON) 200 MG capsule Take 1 capsule (200 mg total) by mouth 3 (three) times daily as needed for up to 7 days for cough. 01/03/20 01/10/20  Shena Vinluan C, PA-C  carvedilol (COREG) 6.25 MG tablet Take 1 tablet (6.25 mg total) by mouth 2 (two) times daily with a meal. 07/20/19  07/19/20  Elouise Munroe, MD  dorzolamide-timolol (COSOPT) 22.3-6.8 MG/ML ophthalmic solution Place 1 drop into both eyes 2 (two) times daily.    [provider]  Empagliflozin-metFORMIN HCl (SYNJARDY) 12.06-998 MG TABS Take 1 tablet by mouth 2 (two) times a day.  07/06/18   [provider]  esomeprazole (NEXIUM) 40 MG capsule Take 40 mg by mouth daily at 12 noon.    [provider]  insulin glargine, 2 Unit Dial, (TOUJEO MAX SOLOSTAR) 300 UNIT/ML Solostar Pen Inject 40 Units into the skin daily.     [provider]  iron polysaccharides (NIFEREX) 150 MG capsule Take 150 mg by mouth daily. 10/14/19   [provider]  lisinopril (ZESTRIL) 40 MG tablet Take 40 mg by mouth daily.    [provider]  pantoprazole (PROTONIX) 40 MG tablet Take 40 mg by mouth daily. 07/03/19   [provider]  predniSONE (DELTASONE) 20 MG tablet Take 2 tablets (40 mg total) by mouth daily with breakfast for 5 days. 01/03/20 01/08/20  Juana Montini C, PA-C  Semaglutide,0.25 or 0.5MG /DOS, (OZEMPIC, 0.25 OR 0.5 MG/DOSE,) 2 MG/1.5ML SOPN Inject into the skin. 09/10/19   [provider]  sulfamethoxazole-trimethoprim (BACTRIM DS) 800-160 MG tablet Take 1 tablet by mouth 2 (two) times daily for 7 days. 01/03/20 01/10/20  Chaelyn Bunyan C, PA-C  Tiotropium Bromide Monohydrate (SPIRIVA RESPIMAT) 2.5 MCG/ACT AERS Inhale 2 puffs into the lungs daily. 09/24/19   Martyn Ehrich, NP    Family History Family History  Problem Relation Age of Onset  . Diabetes Father     Social History Social History   Tobacco Use  . Smoking status: Former Smoker    Packs/day: 3.00    Years: 20.00    Pack years: 60.00    Types: Cigarettes  . Smokeless tobacco: Current User    Types: Chew  . Tobacco comment: 1 can/daily  Substance Use Topics  . Alcohol use: Yes    Comment: social, 1x weekly  . Drug use: No     Allergies   Naproxen, Naproxen sodium, and  Penicillins   Review of Systems Review of Systems  Constitutional: Negative for activity change, appetite change, chills, fatigue and fever.  HENT: Positive for congestion. Negative for ear pain, rhinorrhea, sinus pressure, sore throat and trouble swallowing.   Eyes: Negative for discharge and redness.  Respiratory: Positive for cough, shortness of breath and wheezing. Negative for chest tightness.   Cardiovascular: Negative for chest pain.  Gastrointestinal: Negative for abdominal pain, diarrhea, nausea and vomiting.  Genitourinary: Positive for dysuria and frequency. Negative for difficulty urinating, discharge, penile pain, penile swelling, scrotal swelling and testicular pain.  Musculoskeletal: Negative for myalgias.  Skin: Negative for rash.  Neurological: Negative for dizziness,  light-headedness and headaches.     Physical Exam Triage Vital Signs ED Triage Vitals [01/03/20 1741]  Enc Vitals Group     BP (!) 174/101     Pulse Rate (!) 106     Resp 20     Temp 98.8 F (37.1 C)     Temp Source Oral     SpO2 93 %     Weight      Height      Head Circumference      Peak Flow      Pain Score 6     Pain Loc      Pain Edu?      Excl. in [place]?    No data found.  Updated Vital Signs BP (!) 174/101 (BP Location: Left Arm)   Pulse (!) 106   Temp 98.8 F (37.1 C) (Oral)   Resp 20   SpO2 93%   Visual Acuity Right Eye Distance:   Left Eye Distance:   Bilateral Distance:    Right Eye Near:   Left Eye Near:    Bilateral Near:     Physical Exam Vitals and nursing note reviewed.  Constitutional:      Appearance: He is well-developed.     Comments: No acute distress  HENT:     Head: Normocephalic and atraumatic.     Nose: Nose normal.     Mouth/Throat:     Comments: Oral mucosa pink and moist, no tonsillar enlargement or exudate. Posterior pharynx patent and nonerythematous, no uvula deviation or swelling. Normal phonation.  Chewing tobacco noted in mouth Eyes:      Conjunctiva/sclera: Conjunctivae normal.  Cardiovascular:     Rate and Rhythm: Normal rate.  Pulmonary:     Effort: Pulmonary effort is normal. No respiratory distress.     Comments: End expiratory wheezing noted in all lung fields bilaterally Abdominal:     General: There is no distension.  Musculoskeletal:        General: Normal range of motion.     Cervical back: Neck supple.  Skin:    General: Skin is warm and dry.  Neurological:     Mental Status: He is alert and oriented to person, place, and time.      UC Treatments / Results  Labs (all labs ordered are listed, but only abnormal results are displayed) Labs Reviewed  POCT URINALYSIS DIP (MANUAL ENTRY) - Abnormal; Notable for the following components:      Result Value   Clarity, UA cloudy (*)    Glucose, UA =500 (*)    Blood, UA large (*)    Protein Ur, POC >=300 (*)    Nitrite, UA Positive (*)    All other components within normal limits  URINE CULTURE    EKG   Radiology No results found.  Procedures Procedures (including critical care time)  Medications Ordered in UC Medications - No data to display  Initial Impression / Assessment and Plan / UC Course  I have reviewed the triage vital signs and the nursing notes.  Pertinent labs & imaging results that were available during my care of the patient were reviewed by me and considered in my medical decision making (see chart for details).     1.  UTI-UA consistent with UTI, positive nitrites, large leuks, urine culture pending.  No history of prostatitis.  Initially on Bactrim twice daily x 1 week.  Push fluids and continue to monitor symptoms.  2.  COPD exacerbation-albuterol inhaler refilled,  continue Advair/Spiriva, 5-day course of prednisone and Tessalon for cough.  Continue to monitor,Discussed strict return precautions. Patient verbalized understanding and is agreeable with plan.  Final Clinical Impressions(s) / UC Diagnoses   Final diagnoses:   Lower urinary tract infection, acute  COPD exacerbation ([place])     Discharge Instructions     Urine showed evidence of infection. We are treating you with bactrim- twice daily for 1 week. Be sure to take full course. Stay hydrated- urine should be pale yellow to clear. May continue azo for relief of burning while infection is being cleared.   Albuterol inhaler filled, prednisone 40 mg daily for 5 days, tessalon for cough  Please return or follow up with your primary provider if symptoms not improving with treatment. Please return sooner if you have worsening of symptoms or develop fever, nausea, vomiting, abdominal pain, back pain, lightheadedness, dizziness.    ED Prescriptions    Medication Sig Dispense Auth. Provider   sulfamethoxazole-trimethoprim (BACTRIM DS) 800-160 MG tablet Take 1 tablet by mouth 2 (two) times daily for 7 days. 14 tablet Eldra Word C, PA-C   predniSONE (DELTASONE) 20 MG tablet Take 2 tablets (40 mg total) by mouth daily with breakfast for 5 days. 10 tablet Binta Statzer C, PA-C   benzonatate (TESSALON) 200 MG capsule Take 1 capsule (200 mg total) by mouth 3 (three) times daily as needed for up to 7 days for cough. 28 capsule Raesha Coonrod C, PA-C   albuterol (VENTOLIN HFA) 108 (90 Base) MCG/ACT inhaler Inhale 1-2 puffs into the lungs every 6 (six) hours as needed for wheezing or shortness of breath. 18 g Blong Busk, [place] C, PA-C     PDMP not reviewed this encounter.   Janith Lima, [place] 01/03/20 2110

## 2020-01-03 NOTE — Discharge Instructions (Signed)
Urine showed evidence of infection. We are treating you with bactrim- twice daily for 1 week. Be sure to take full course. Stay hydrated- urine should be pale yellow to clear. May continue azo for relief of burning while infection is being cleared.   Albuterol inhaler filled, prednisone 40 mg daily for 5 days, tessalon for cough  Please return or follow up with your primary provider if symptoms not improving with treatment. Please return sooner if you have worsening of symptoms or develop fever, nausea, vomiting, abdominal pain, back pain, lightheadedness, dizziness.

## 2020-01-06 LAB — URINE CULTURE: Culture: >=100000 — AB

## 2020-01-08 ENCOUNTER — Telehealth: Payer: Self-pay | Admitting: Emergency Medicine

## 2020-01-08 MED ORDER — PREDNISONE 10 MG (21) PO TBPK: ORAL_TABLET | Freq: Every day | ORAL | 0 refills | Status: DC

## 2020-01-08 NOTE — Telephone Encounter (Signed)
Bronchitis worsening, providing prednisone taper as this is helped him in the past.

## 2020-01-30 ENCOUNTER — Other Ambulatory Visit: Payer: Self-pay | Admitting: Primary Care

## 2020-01-30 ENCOUNTER — Ambulatory Visit (INDEPENDENT_AMBULATORY_CARE_PROVIDER_SITE_OTHER): Payer: BC Managed Care – PPO | Admitting: Ophthalmology

## 2020-01-30 ENCOUNTER — Other Ambulatory Visit: Payer: Self-pay

## 2020-01-30 ENCOUNTER — Encounter (INDEPENDENT_AMBULATORY_CARE_PROVIDER_SITE_OTHER): Payer: Self-pay | Admitting: Ophthalmology

## 2020-01-30 DIAGNOSIS — H3581 Retinal edema: Secondary | ICD-10-CM | POA: Diagnosis not present

## 2020-01-30 DIAGNOSIS — E113413 Type 2 diabetes mellitus with severe nonproliferative diabetic retinopathy with macular edema, bilateral: Secondary | ICD-10-CM | POA: Diagnosis not present

## 2020-01-30 DIAGNOSIS — H35033 Hypertensive retinopathy, bilateral: Secondary | ICD-10-CM

## 2020-01-30 DIAGNOSIS — D3132 Benign neoplasm of left choroid: Secondary | ICD-10-CM | POA: Diagnosis not present

## 2020-01-30 DIAGNOSIS — H40051 Ocular hypertension, right eye: Secondary | ICD-10-CM

## 2020-01-30 DIAGNOSIS — I10 Essential (primary) hypertension: Secondary | ICD-10-CM

## 2020-01-30 DIAGNOSIS — Z961 Presence of intraocular lens: Secondary | ICD-10-CM

## 2020-01-30 MED ORDER — AFLIBERCEPT 2MG/0.05ML IZ SOLN FOR KALEIDOSCOPE
2.0000 mg | INTRAVITREAL | Status: AC | PRN
  Administered 2020-01-30: 17:00:00 2 mg via INTRAVITREAL

## 2020-01-30 NOTE — Progress Notes (Signed)
[facility] - Clinic Note  01/30/2020     CHIEF COMPLAINT Patient presents for Retina Follow Up   HISTORY OF PRESENT ILLNESS: Juan [name] is a 59 y.o. male who presents to the clinic today for:   HPI    Retina Follow Up    Patient presents with  Diabetic Retinopathy.  In both eyes.  I, the attending physician,  performed the HPI with the patient and updated documentation appropriately.          Comments    4 week retina follow up for PDR ou. Patient states no vision changes noticed since last visit. Patient blood sugar yesterday 110.       Last edited by Rennis Chris, MD on 01/30/2020  4:58 PM. (History)    pt states vision is about the same  Referring physician: Laurann Montana, MD [phone] [address] [place],  [place] [address]  HISTORICAL INFORMATION:   Selected notes from the MEDICAL RECORD NUMBER Referred by Dr. Jethro Bolus for concern of CME s/p cataract sx   CURRENT MEDICATIONS: Current Outpatient Medications (Ophthalmic Drugs)  Medication Sig  . dorzolamide-timolol (COSOPT) 22.3-6.8 MG/ML ophthalmic solution Place 1 drop into both eyes 2 (two) times daily.   No current facility-administered medications for this visit. (Ophthalmic Drugs)   Current Outpatient Medications (Other)  Medication Sig  . ADVAIR DISKUS 500-50 MCG/DOSE AEPB Inhale 1 puff into the lungs 2 (two) times daily.  Marland Kitchen albuterol (VENTOLIN HFA) 108 (90 Base) MCG/ACT inhaler Inhale 1-2 puffs into the lungs every 6 (six) hours as needed for wheezing or shortness of breath.  . alfuzosin (UROXATRAL) 10 MG 24 hr tablet TAKE 1 TABLET BY MOUTH TWICE A DAY  . aspirin EC 81 MG tablet Take 81 mg by mouth daily.  Marland Kitchen atorvastatin (LIPITOR) 40 MG tablet Take 40 mg by mouth daily.  . carvedilol (COREG) 6.25 MG tablet Take 1 tablet (6.25 mg total) by mouth 2 (two) times daily with a meal.  . Empagliflozin-metFORMIN HCl (SYNJARDY) 12.06-998 MG TABS Take 1 tablet by mouth 2 (two)  times a day.   . esomeprazole (NEXIUM) 40 MG capsule Take 40 mg by mouth daily at 12 noon.  . insulin glargine, 2 Unit Dial, (TOUJEO MAX SOLOSTAR) 300 UNIT/ML Solostar Pen Inject 40 Units into the skin daily.   . iron polysaccharides (NIFEREX) 150 MG capsule Take 150 mg by mouth daily.  Marland Kitchen lisinopril (ZESTRIL) 40 MG tablet Take 40 mg by mouth daily.  . pantoprazole (PROTONIX) 40 MG tablet Take 40 mg by mouth daily.  . predniSONE (STERAPRED UNI-PAK 21 TAB) 10 MG (21) TBPK tablet Take by mouth daily. Take as directed  . Semaglutide,0.25 or 0.5MG /DOS, (OZEMPIC, 0.25 OR 0.5 MG/DOSE,) 2 MG/1.5ML SOPN Inject into the skin.  Marland Kitchen SPIRIVA RESPIMAT 2.5 MCG/ACT AERS INHALE 2 PUFFS BY MOUTH INTO THE LUNGS DAILY   No current facility-administered medications for this visit. (Other)      REVIEW OF SYSTEMS: ROS    Positive for: Endocrine, Eyes   Negative for: Constitutional, Gastrointestinal, Neurological, Skin, Genitourinary, Musculoskeletal, HENT, Cardiovascular, Respiratory, Psychiatric, Allergic/Imm, Heme/Lymph   Last edited by Lana Fish, COT on 01/30/2020  2:36 PM. (History)       ALLERGIES Allergies  Allergen Reactions  . Naproxen Shortness Of Breath  . Naproxen Sodium Shortness Of Breath  . Penicillins Rash    Has patient had a PCN reaction causing immediate rash, facial/tongue/throat swelling, SOB or lightheadedness with hypotension: Yes  Has patient had a PCN reaction causing severe rash involving mucus membranes or skin necrosis: No Has patient had a PCN reaction that required hospitalization No Has patient had a PCN reaction occurring within the last 10 years: No If all of the above answers are "NO", then may proceed with Cephalosporin use.  Has patient had a PCN reaction causing immediate rash, facial/tongue/throat swelling, SOB or lightheadedness with hypotension: Yes Has patient had a PCN reaction causing severe rash involving mucus membranes or skin necrosis: No Has patient had  a PCN reaction that required hospitalization No Has patient had a PCN reaction occurring within the last 10 years: No If all of the above answers are "NO", then may proceed with Cephalosporin use.     PAST MEDICAL HISTORY Past Medical History:  Diagnosis Date  . Arthritis   . Asthma   . COPD (chronic obstructive pulmonary disease) ([place])   . Diabetes mellitus    Type 2  . Diabetic retinopathy ([place])    NPDR OU  . GERD (gastroesophageal reflux disease)   . H/O hiatal hernia   . History of kidney stones   . Hyperlipemia   . Hypertension   . Hypertensive retinopathy    OU  . Nasal polyps   . Pneumonia 2014  . Shortness of breath   . Sleep apnea    pt has CPAP but is unable to use it d/t having polyps in his nose. Pt stated "I need to call and get a CPAP mask instead"  . Stomach ulcer    Past Surgical History:  Procedure Laterality Date  . CATARACT EXTRACTION Right 07/05/2018   Dr. Gershon Crane  . CATARACT EXTRACTION Left 07/12/2018   Dr. Gershon Crane  . CHOLECYSTECTOMY N/A 01/23/2015   Procedure: LAPAROSCOPIC CHOLECYSTECTOMY ;  Surgeon: Georganna Skeans, MD;  Location: [facility];  Service: General;  Laterality: N/A;  . COLONOSCOPY W/ POLYPECTOMY    . ESOPHAGOGASTRODUODENOSCOPY    . EYE SURGERY    . KNEE ARTHROSCOPY  03/24/2011   Procedure: ARTHROSCOPY KNEE;  Surgeon: Alta Corning, MD;  Location: [facility];  Service: Orthopedics;  Laterality: Right;  partial medial menisectomy, chondroplaty patella, and medial medial plica  . LEFT HEART CATH AND CORONARY ANGIOGRAPHY N/A 06/29/2019   Procedure: LEFT HEART CATH AND CORONARY ANGIOGRAPHY;  Surgeon: Wellington Hampshire, MD;  Location: [facility];  Service: Cardiovascular;  Laterality: N/A;    FAMILY HISTORY Family History  Problem Relation Age of Onset  . Diabetes Father     SOCIAL HISTORY Social History   Tobacco Use  . Smoking status: Former Smoker    Packs/day: 3.00    Years: 20.00    Pack years: 60.00     Types: Cigarettes  . Smokeless tobacco: Current User    Types: Chew  . Tobacco comment: 1 can/daily  Substance Use Topics  . Alcohol use: Yes    Comment: social, 1x weekly  . Drug use: No         OPHTHALMIC EXAM:  Base Eye Exam    Visual Acuity (Snellen - Linear)      Right Left   Dist [place] 20/30-2 20/60-1   Dist ph  20/20-2 20/50-2       Tonometry (Tonopen, 2:38 PM)      Right Left   Pressure 13 10       Pupils      Dark Light Shape React APD   Right 3 2 Round Brisk None   Left  3 2 Round Brisk None       Visual Fields (Counting fingers)      Left Right    Full Full       Extraocular Movement      Right Left    Full, Ortho Full, Ortho       Neuro/Psych    Oriented x3: Yes   Mood/Affect: Normal       Dilation    Both eyes: 1.0% Mydriacyl, 2.5% Phenylephrine @ 2:38 PM        Slit Lamp and Fundus Exam    Slit Lamp Exam      Right Left   Lids/Lashes Dermatochalasis - upper lid, Telangiectasia, mild Meibomian gland dysfunction Dermatochalasis - upper lid, Telangiectasia, mild Meibomian gland dysfunction   Conjunctiva/Sclera White and quiet, +concretions on palpebral conj Mild nasal Pinguecula, +concretions on palpebral conj   Cornea Mild Arcus, Well healed temporal cataract wounds Arcus, 1+ Descemet's folds, Well healed temporal cataract wounds   Anterior Chamber deep, 1+ cell/pigment deep, 1+ cell/pigment   Iris Round and dilated, mild patch of atrophy at 0800, no NVI Round and dilated, No NVI   Lens PC IOL in good position PC IOL in good position   Vitreous Vitreous syneresis Vitreous syneresis       Fundus Exam      Right Left   Disc Pink and Sharp Pallor, Sharp rim   C/D Ratio 0.3 0.3   Macula Blunted foveal reflex, scattered IRH/DBH/exudates -- greatest temporal macula -- slightly improved Blunted foveal reflex, +central edema -- slightly improved, scattered DBH and MAs, scattered punctate exudates - greatest superior and temporal macula -  improving   Vessels Vascular attenuation, Tortuous Vascular attenuation, Tortuous   Periphery Attached, scattered MA and exudates greatest posteriorly Attached, pigmented choroidal nevus superiorly with mild elevation, +drusen, no SRF -- unchanged from prior          IMAGING AND PROCEDURES  Imaging and Procedures for @TODAY @  OCT, Retina - OU - Both Eyes       Right Eye Quality was good. Central Foveal Thickness: 276. Progression has improved. Findings include abnormal foveal contour, intraretinal hyper-reflective material, intraretinal fluid, outer retinal atrophy, no SRF (Interval improvement in IRF/IRHM).   Left Eye Quality was good. Central Foveal Thickness: 263. Progression has improved. Findings include intraretinal fluid, intraretinal hyper-reflective material, abnormal foveal contour, no SRF, outer retinal atrophy (Interval improvement in IRF/IRHM, Sub-RPE hyper-reflective mass consistent with choroidal nevus--superior to disc, caught on widefield -- stable from prior).   Notes *Images captured and stored on drive  Diagnosis / Impression: Severe DME OU OD: Interval improvement in IRF/IRHM OS: Interval improvement in IRF/IRHM, Sub-RPE hyper-reflective mass consistent with choroidal nevus--superior to disc, caught on widefield -- stable from prior  Clinical management:  See below  Abbreviations: NFP - Normal foveal profile. CME - cystoid macular edema. PED - pigment epithelial detachment. IRF - intraretinal fluid. SRF - subretinal fluid. EZ - ellipsoid zone. ERM - epiretinal membrane. ORA - outer retinal atrophy. ORT - outer retinal tubulation. SRHM - subretinal hyper-reflective material        Intravitreal Injection, Pharmacologic Agent - OD - Right Eye       Time Out 01/30/2020. 2:47 PM. Confirmed correct patient, procedure, site, and patient consented.   Anesthesia Topical anesthesia was used. Anesthetic medications included Lidocaine 2%, Proparacaine 0.5%.    Procedure Preparation included eyelid speculum, 5% betadine to ocular surface. A (32g) needle was used.   Injection:  2 mg  aflibercept Gretta Cool) SOLN   NDC: L6038910, Lot: [id number], Expiration date: 05/01/2020   Route: Intravitreal, Site: Right Eye, Waste: 0.05 mL  Post-op Post injection exam found visual acuity of at least counting fingers. The patient tolerated the procedure well. There were no complications. The patient received written and verbal post procedure care education. Post injection medications were not given.        Intravitreal Injection, Pharmacologic Agent - OS - Left Eye       Time Out 01/30/2020. 2:47 PM. Confirmed correct patient, procedure, site, and patient consented.   Anesthesia Topical anesthesia was used. Anesthetic medications included Lidocaine 2%, Proparacaine 0.5%.   Procedure Preparation included 5% betadine to ocular surface, eyelid speculum. A (32g) needle was used.   Injection:  2 mg aflibercept Gretta Cool) SOLN   NDC: W6696518, Lot: [id number], Expiration date: 12/01/2020   Route: Intravitreal, Site: Left Eye, Waste: 0.05 mL  Post-op Post injection exam found visual acuity of at least counting fingers. The patient tolerated the procedure well. There were no complications. The patient received written and verbal post procedure care education. Post injection medications were not given.                 ASSESSMENT/PLAN:    ICD-10-CM   1. Severe nonproliferative diabetic retinopathy of both eyes with macular edema associated with type 2 diabetes mellitus (HCC)  X32.3557 Intravitreal Injection, Pharmacologic Agent - OD - Right Eye    Intravitreal Injection, Pharmacologic Agent - OS - Left Eye    aflibercept (EYLEA) SOLN 2 mg    aflibercept (EYLEA) SOLN 2 mg  2. 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 Essential hypertension  I10   5. Hypertensive retinopathy of both eyes   H35.033   6. Pseudophakia of both eyes  Z96.1   7. Ocular hypertension of right eye  H40.051     1. Severe nonproliferative diabetic retinopathy with DME, OU (OS>OD)  - s/p IVA OD #1 (06.26.20), #2 (08.14.20), #3 (09.17.20), #4 (10.16.20)  - s/p IVA OS #1 (07.17.20), #2 (08.14.20), #3 (09.17.20)  - s/p IVA OU (08.14.20), #3 (09.17.20)  - s/p IVE OS #1 (10.16.20) - sample, #2 (11.13.20), #3 (12.11.20), #4 (01.18.21), #5 (02.19.21), #6 (03.19.21), #7 (04.16.21), #8 (05.14.21), #9 (06.17.21), #10 (7.16.21), #11 (09.21.21), #12 (10.29.21), #13 (11.29.21)  - s/p IVE OD #1 (11.13.20), #2 (12.11.20), #3 (01.18.21), #4 (02.19.21), #5 (03.19.21), #6 (04.16.21), #7 (05.14.21), #8 (06.17.21), #9 (07.16.21), #10 (09.21.21), #11 (10.29.21), #12 (11.29.21)  - FA (06.26.20) shows Severe NPDR OU w/ Late leaking MA OU, Enlarged FAZ OU, No NV OU, No significant hyperfluorescence of disc  - BCVA: OD stable at 20/20; OS stable at 20/50  - OCT shows interval improvement in IRF OU  - recommend IVE OU -- OD #13 and OS #14 today, 12.29.21  - pt wishes to proceed  - RBA of procedure discussed, questions answered  - informed consent obtained  - Eylea informed consent form signed and scanned on 01.18.2021  - see procedure note  - [email or web address] Benefits Investigation initiated 10.16.2020 -- approved for 2021  - f/u in 4-5 weeks -- DFE/OCT/possible injection OU  2. Retinal Edema OU  - based on FA, suspect majority of macular edema due to DM as above, but there may be some edema secondary to post-op CME  - cont PF qid OU and ketorolac qid OU   - history of difficulty with  compliance at work  - monitor  - f/u in 4 weeks  3. Choroidal Nevus OS  - located at 1200, mild elevation, +drusen, no SRF  - baseline optos pictures obtained, 06.26.20  - discussed possible referral to ocular oncologist at [facility] for evaluation/management  4,5.Hypertensive retinopathy OU  - discussed importance of tight BP control  -  monitor  6. Pseudophakia OU  - s/p CE/IOL OU (OD on 06.03.20 and OS 06.10.20 by Dr. Gershon Crane)  - beautiful surgeries w/ IOLs in excellent position  - macular edema limiting vision as above  - monitor  7. Ocular hypertension OD  - IOP 13 OD  - cont Cosopt BID OD  - monitor   Ophthalmic Meds Ordered this visit:  Meds ordered this encounter  Medications  . aflibercept (EYLEA) SOLN 2 mg  . aflibercept (EYLEA) SOLN 2 mg      Return for f/u 4-5 weeks, NPDR OU, DFE, OCT.  There are no Patient Instructions on file for this visit.  This document serves as a record of services personally performed by Gardiner Sleeper, MD, PhD. It was created on their behalf by Roselee Nova, COMT. The creation of this record is the provider's dictation and/or activities during the visit.  Electronically signed by: Roselee Nova, COMT 01/30/20 5:01 PM   This document serves as a record of services personally performed by Gardiner Sleeper, MD, PhD. It was created on their behalf by San Jetty. Owens Shark, OA an ophthalmic technician. The creation of this record is the provider's dictation and/or activities during the visit.    Electronically signed by: San Jetty. Owens Shark, [place] 12.29.2021 5:01 PM  Gardiner Sleeper, M.D., Ph.D. Diseases & Surgery of the Retina and Vitreous [facility]  I have reviewed the above documentation for accuracy and completeness, and I agree with the above. Gardiner Sleeper, M.D., Ph.D. 01/30/20 5:01 PM   Abbreviations: M myopia (nearsighted); A astigmatism; H hyperopia (farsighted); P presbyopia; Mrx spectacle prescription;  CTL contact lenses; OD right eye; OS left eye; OU both eyes  XT exotropia; ET esotropia; PEK punctate epithelial keratitis; PEE punctate epithelial erosions; DES dry eye syndrome; MGD meibomian gland dysfunction; ATs artificial tears; PFAT's preservative free artificial tears; [place] nuclear sclerotic cataract; PSC posterior subcapsular cataract; ERM  epi-retinal membrane; PVD posterior vitreous detachment; RD retinal detachment; DM diabetes mellitus; DR diabetic retinopathy; NPDR non-proliferative diabetic retinopathy; PDR proliferative diabetic retinopathy; CSME clinically significant macular edema; DME diabetic macular edema; dbh dot blot hemorrhages; CWS cotton wool spot; POAG primary open angle glaucoma; C/D cup-to-disc ratio; HVF humphrey visual field; GVF goldmann visual field; OCT optical coherence tomography; IOP intraocular pressure; BRVO Branch retinal vein occlusion; CRVO central retinal vein occlusion; CRAO central retinal artery occlusion; BRAO branch retinal artery occlusion; RT retinal tear; SB scleral buckle; PPV pars plana vitrectomy; VH Vitreous hemorrhage; PRP panretinal laser photocoagulation; IVK intravitreal kenalog; VMT vitreomacular traction; MH Macular hole;  NVD neovascularization of the disc; NVE neovascularization elsewhere; AREDS age related eye disease study; ARMD age related macular degeneration; POAG primary open angle glaucoma; EBMD epithelial/anterior basement membrane dystrophy; ACIOL anterior chamber intraocular lens; IOL intraocular lens; PCIOL posterior chamber intraocular lens; Phaco/IOL phacoemulsification with intraocular lens placement; [place] photorefractive keratectomy; LASIK laser assisted in situ keratomileusis; HTN hypertension; DM diabetes mellitus; COPD chronic obstructive pulmonary disease

## 2020-02-05 ENCOUNTER — Other Ambulatory Visit: Payer: Self-pay | Admitting: Internal Medicine

## 2020-02-13 ENCOUNTER — Telehealth: Payer: Self-pay | Admitting: Internal Medicine

## 2020-02-13 NOTE — Telephone Encounter (Signed)
*  STAT* If patient is at the pharmacy, call can be transferred to refill team.   1. Which medications need to be refilled? (please list name of each medication and dose if known)  carvedilol (COREG) 6.25 MG tablet  2. Which pharmacy/location (including street and city if local pharmacy) is medication to be sent to? [id number] - [place], [place] [address].  3. Do they need a 30 day or 90 day supply? 30 with refills     Patient is out of refills

## 2020-02-15 NOTE — Telephone Encounter (Signed)
Patient calling back. He states he still has not received his refill and is out of medication.

## 2020-02-15 NOTE — Telephone Encounter (Signed)
Carvedilol refilled in separate encounter.

## 2020-02-21 DIAGNOSIS — E785 Hyperlipidemia, unspecified: Secondary | ICD-10-CM | POA: Diagnosis not present

## 2020-02-21 DIAGNOSIS — D509 Iron deficiency anemia, unspecified: Secondary | ICD-10-CM | POA: Diagnosis not present

## 2020-02-21 DIAGNOSIS — J449 Chronic obstructive pulmonary disease, unspecified: Secondary | ICD-10-CM | POA: Diagnosis not present

## 2020-02-21 DIAGNOSIS — Z0000 Encounter for general adult medical examination without abnormal findings: Secondary | ICD-10-CM | POA: Diagnosis not present

## 2020-02-21 DIAGNOSIS — E113413 Type 2 diabetes mellitus with severe nonproliferative diabetic retinopathy with macular edema, bilateral: Secondary | ICD-10-CM | POA: Diagnosis not present

## 2020-02-21 DIAGNOSIS — I10 Essential (primary) hypertension: Secondary | ICD-10-CM | POA: Diagnosis not present

## 2020-02-21 DIAGNOSIS — Z125 Encounter for screening for malignant neoplasm of prostate: Secondary | ICD-10-CM | POA: Diagnosis not present

## 2020-03-04 NOTE — Progress Notes (Signed)
[facility] Note  03/05/2020     CHIEF COMPLAINT Patient presents for Retina Follow Up   HISTORY OF PRESENT ILLNESS: Juan [name] is a 60 y.o. male who presents to the clinic today for:   HPI    Retina Follow Up    Patient presents with  Diabetic Retinopathy.  In both eyes.  This started months ago.  Severity is moderate.  Duration of 5 weeks.  Since onset it is stable.  I, the attending physician,  performed the HPI with the patient and updated documentation appropriately.          Comments    60 y/o male pt here for 5 wk f/u for severe NPDR w/DME OU.  No change in [place] OU.  Denies pain, FOL, floaters.  Cosopt BID OD.  BS and A1C unknown.       Last edited by Bernarda Caffey, MD on 03/05/2020 10:56 PM. (History)    pt states vision is about the same  Referring physician: Harlan Stains, MD [place],  [place] [address]  HISTORICAL INFORMATION:   Selected notes from the MEDICAL RECORD NUMBER Referred by Dr. Rutherford Guys for concern of CME s/p cataract sx   CURRENT MEDICATIONS: Current Outpatient Medications (Ophthalmic Drugs)  Medication Sig  . ketorolac (ACULAR) 0.5 % ophthalmic solution Place 1 drop into both eyes 4 (four) times daily.  . prednisoLONE acetate (PRED FORTE) 1 % ophthalmic suspension Place 1 drop into both eyes 4 (four) times daily.  . dorzolamide-timolol (COSOPT) 22.3-6.8 MG/ML ophthalmic solution Place 1 drop into the right eye 2 (two) times daily.   No current facility-administered medications for this visit. (Ophthalmic Drugs)   Current Outpatient Medications (Other)  Medication Sig  . ADVAIR DISKUS 500-50 MCG/DOSE AEPB Inhale 1 puff into the lungs 2 (two) times daily.  Marland Kitchen albuterol (VENTOLIN HFA) 108 (90 Base) MCG/ACT inhaler Inhale 1-2 puffs into the lungs every 6 (six) hours as needed for wheezing or shortness of breath.  . alfuzosin (UROXATRAL) 10 MG 24 hr tablet TAKE 1 TABLET BY MOUTH TWICE A DAY   . aspirin EC 81 MG tablet Take 81 mg by mouth daily.  Marland Kitchen atorvastatin (LIPITOR) 40 MG tablet Take 40 mg by mouth daily.  . carvedilol (COREG) 6.25 MG tablet TAKE 1 TABLET (6.25 MG TOTAL) BY MOUTH 2 (TWO) TIMES DAILY WITH A MEAL.  . Empagliflozin-metFORMIN HCl (SYNJARDY) 12.06-998 MG TABS Take 1 tablet by mouth 2 (two) times a day.   . esomeprazole (NEXIUM) 40 MG capsule Take 40 mg by mouth daily at 12 noon.  . insulin glargine, 2 Unit Dial, (TOUJEO MAX SOLOSTAR) 300 UNIT/ML Solostar Pen Inject 40 Units into the skin daily.   . iron polysaccharides (NIFEREX) 150 MG capsule Take 150 mg by mouth daily.  Marland Kitchen lisinopril (ZESTRIL) 40 MG tablet Take 40 mg by mouth daily.  . pantoprazole (PROTONIX) 40 MG tablet Take 40 mg by mouth daily.  . predniSONE (STERAPRED UNI-PAK 21 TAB) 10 MG (21) TBPK tablet Take by mouth daily. Take as directed  . Semaglutide,0.25 or 0.5MG /DOS, (OZEMPIC, 0.25 OR 0.5 MG/DOSE,) 2 MG/1.5ML SOPN Inject into the skin.  Marland Kitchen SPIRIVA RESPIMAT 2.5 MCG/ACT AERS INHALE 2 PUFFS BY MOUTH INTO THE LUNGS DAILY   No current facility-administered medications for this visit. (Other)      REVIEW OF SYSTEMS: ROS    Positive for: Gastrointestinal, Endocrine, Eyes, Respiratory   Negative for: Constitutional, Neurological, Skin,  Genitourinary, Musculoskeletal, HENT, Cardiovascular, Psychiatric, Allergic/Imm, Heme/Lymph   Last edited by Celine Mans, COA on 03/05/2020  3:18 PM. (History)       ALLERGIES Allergies  Allergen Reactions  . Naproxen Shortness Of Breath  . Naproxen Sodium Shortness Of Breath  . Penicillins Rash    Has patient had a PCN reaction causing immediate rash, facial/tongue/throat swelling, SOB or lightheadedness with hypotension: Yes Has patient had a PCN reaction causing severe rash involving mucus membranes or skin necrosis: No Has patient had a PCN reaction that required hospitalization No Has patient had a PCN reaction occurring within the last 10 years: No If  all of the above answers are "NO", then may proceed with Cephalosporin use.  Has patient had a PCN reaction causing immediate rash, facial/tongue/throat swelling, SOB or lightheadedness with hypotension: Yes Has patient had a PCN reaction causing severe rash involving mucus membranes or skin necrosis: No Has patient had a PCN reaction that required hospitalization No Has patient had a PCN reaction occurring within the last 10 years: No If all of the above answers are "NO", then may proceed with Cephalosporin use.     PAST MEDICAL HISTORY Past Medical History:  Diagnosis Date  . Arthritis   . Asthma   . COPD (chronic obstructive pulmonary disease) (HCC)   . Diabetes mellitus    Type 2  . Diabetic retinopathy (HCC)    NPDR OU  . GERD (gastroesophageal reflux disease)   . H/O hiatal hernia   . History of kidney stones   . Hyperlipemia   . Hypertension   . Hypertensive retinopathy    OU  . Nasal polyps   . Pneumonia 2014  . Shortness of breath   . Sleep apnea    pt has CPAP but is unable to use it d/t having polyps in his nose. Pt stated "I need to call and get a CPAP mask instead"  . Stomach ulcer    Past Surgical History:  Procedure Laterality Date  . CATARACT EXTRACTION Right 07/05/2018   Dr. Nile Riggs  . CATARACT EXTRACTION Left 07/12/2018   Dr. Nile Riggs  . CHOLECYSTECTOMY N/A 01/23/2015   Procedure: LAPAROSCOPIC CHOLECYSTECTOMY ;  Surgeon: Violeta Gelinas, MD;  [facility];  Service: General;  Laterality: N/A;  . COLONOSCOPY W/ POLYPECTOMY    . ESOPHAGOGASTRODUODENOSCOPY    . EYE SURGERY    . KNEE ARTHROSCOPY  03/24/2011   Procedure: ARTHROSCOPY KNEE;  Surgeon: Harvie Junior, MD;  Location: [facility];  Service: Orthopedics;  Laterality: Right;  partial medial menisectomy, chondroplaty patella, and medial medial plica  . LEFT HEART CATH AND CORONARY ANGIOGRAPHY N/A 06/29/2019   Procedure: LEFT HEART CATH AND CORONARY ANGIOGRAPHY;  Surgeon: Iran Ouch, MD;  Location: [facility] INVASIVE CV LAB;  Service: Cardiovascular;  Laterality: N/A;    FAMILY HISTORY Family History  Problem Relation Age of Onset  . Diabetes Father     SOCIAL HISTORY Social History   Tobacco Use  . Smoking status: Former Smoker    Packs/day: 3.00    Years: 20.00    Pack years: 60.00    Types: Cigarettes  . Smokeless tobacco: Current User    Types: Chew  . Tobacco comment: 1 can/daily  Substance Use Topics  . Alcohol use: Yes    Comment: social, 1x weekly  . Drug use: No         OPHTHALMIC EXAM:  Base Eye Exam    Visual Acuity (Snellen - Linear)  Right Left   Dist [place] 20/25 -2 20/60 -2   Dist ph [place] NI 20/50 -2       Tonometry (Tonopen, 3:21 PM)      Right Left   Pressure 12 11       Pupils      Dark Light Shape React APD   Right 3 2 Round Brisk None   Left 3 2 Round Brisk None       Visual Fields (Counting fingers)      Left Right    Full Full       Extraocular Movement      Right Left    Full, Ortho Full, Ortho       Neuro/Psych    Oriented x3: Yes   Mood/Affect: Normal       Dilation    Both eyes: 1.0% Mydriacyl, 2.5% Phenylephrine @ 3:20 PM        Slit Lamp and Fundus Exam    Slit Lamp Exam      Right Left   Lids/Lashes Dermatochalasis - upper lid, Telangiectasia, mild Meibomian gland dysfunction Dermatochalasis - upper lid, Telangiectasia, mild Meibomian gland dysfunction   Conjunctiva/Sclera White and quiet, +concretions on palpebral conj Mild nasal Pinguecula, +concretions on palpebral conj   Cornea Mild Arcus, Well healed temporal cataract wounds Arcus, 1+ Descemet's folds, Well healed temporal cataract wounds   Anterior Chamber deep, 1+ cell/pigment deep, 1+ cell/pigment   Iris Round and dilated, mild patch of atrophy at 0800, no NVI Round and dilated, No NVI   Lens PC IOL in good position PC IOL in good position   Vitreous Vitreous syneresis Vitreous syneresis       Fundus Exam      Right Left   Disc Pink  and Sharp Pallor, Sharp rim   C/D Ratio 0.3 0.3   Macula Blunted foveal reflex, scattered IRH/DBH/exudates -- greatest temporal macula -- slightly improved, trace residual cystic changes remain Blunted foveal reflex, +central edema -- slightly improved, scattered DBH and MAs, scattered punctate exudates - greatest superior and temporal macula - improving   Vessels Vascular attenuation, Tortuous Vascular attenuation, Tortuous   Periphery Attached, scattered MA and exudates greatest posteriorly Attached, pigmented choroidal nevus superiorly with mild elevation, +drusen, no SRF -- unchanged from prior          IMAGING AND PROCEDURES  Imaging and Procedures for @TODAY @  OCT, Retina - OU - Both Eyes       Right Eye Quality was good. Central Foveal Thickness: 271. Progression has improved. Findings include abnormal foveal contour, intraretinal hyper-reflective material, intraretinal fluid, outer retinal atrophy, no SRF (Mild Interval improvement in IRF).   Left Eye Quality was good. Central Foveal Thickness: 257. Progression has been stable. Findings include intraretinal fluid, intraretinal hyper-reflective material, abnormal foveal contour, no SRF, outer retinal atrophy (Mild Interval improvement in IRF, Sub-RPE hyper-reflective mass consistent with choroidal nevus--superior to disc, caught on widefield -- stable from prior).   Notes *Images captured and stored on drive  Diagnosis / Impression: Severe DME OU OD: Interval improvement in IRF OS: Interval improvement in IRF, Sub-RPE hyper-reflective mass consistent with choroidal nevus--superior to disc, caught on widefield -- stable from prior  Clinical management:  See below  Abbreviations: NFP - Normal foveal profile. CME - cystoid macular edema. PED - pigment epithelial detachment. IRF - intraretinal fluid. SRF - subretinal fluid. EZ - ellipsoid zone. ERM - epiretinal membrane. ORA - outer retinal atrophy. ORT - outer retinal  tubulation. SRHM -  subretinal hyper-reflective material        Intravitreal Injection, Pharmacologic Agent - OD - Right Eye       Time Out 03/05/2020. 4:08 PM. Confirmed correct patient, procedure, site, and patient consented.   Anesthesia Topical anesthesia was used. Anesthetic medications included Lidocaine 2%, Proparacaine 0.5%.   Procedure Preparation included eyelid speculum, 5% betadine to ocular surface. A (32g) needle was used.   Injection:  1.25 mg Bevacizumab (AVASTIN) 1.25mg /0.57mL SOLN   NDC: [id number], Lot: [id number] , Expiration date: 04/09/2020   Route: Intravitreal, Site: Right Eye, Waste: 0 mL  Post-op Post injection exam found visual acuity of at least counting fingers. The patient tolerated the procedure well. There were no complications. The patient received written and verbal post procedure care education. Post injection medications were not given.        Intravitreal Injection, Pharmacologic Agent - OS - Left Eye       Time Out 03/05/2020. 4:09 PM. Confirmed correct patient, procedure, site, and patient consented.   Anesthesia Topical anesthesia was used. Anesthetic medications included Lidocaine 2%, Proparacaine 0.5%.   Procedure Preparation included 5% betadine to ocular surface, eyelid speculum. A (32g) needle was used.   Injection:  2 mg aflibercept 15/03/2020) SOLN   NDC: Alfonse Flavors, Lot: [id number], Expiration date: 08/01/2020   Route: Intravitreal, Site: Left Eye, Waste: 0.05 mL  Post-op Post injection exam found visual acuity of at least counting fingers. The patient tolerated the procedure well. There were no complications. The patient received written and verbal post procedure care education. Post injection medications were not given.   Notes **SAMPLE MEDICATION ADMINISTERED**                ASSESSMENT/PLAN:    ICD-10-CM   1. Severe nonproliferative diabetic retinopathy of both eyes with macular edema associated with type 2  diabetes mellitus (HCC)  20/02/2020 Intravitreal Injection, Pharmacologic Agent - OD - Right Eye    Intravitreal Injection, Pharmacologic Agent - OS - Left Eye    aflibercept (EYLEA) SOLN 2 mg    Bevacizumab (AVASTIN) SOLN 1.25 mg  2. 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 Essential hypertension  I10   5. Hypertensive retinopathy of both eyes  H35.033   6. Pseudophakia of both eyes  Z96.1   7. Ocular hypertension of right eye  H40.051     1. Severe nonproliferative diabetic retinopathy with DME, OU (OS>OD)  - s/p IVA OD #1 (06.26.20), #2 (08.14.20), #3 (09.17.20), #4 (10.16.20)  - s/p IVA OS #1 (07.17.20), #2 (08.14.20), #3 (09.17.20)  - s/p IVA OU (08.14.20), #3 (09.17.20)  - s/p IVE OS #1 (10.16.20) - sample, #2 (11.13.20), #3 (12.11.20), #4 (01.18.21), #5 (02.19.21), #6 (03.19.21), #7 (04.16.21), #8 (05.14.21), #9 (06.17.21), #10 (7.16.21), #11 (09.21.21), #12 (10.29.21), #13 (11.29.21), #14 (12.29.21)  - s/p IVE OD #1 (11.13.20), #2 (12.11.20), #3 (01.18.21), #4 (02.19.21), #5 (03.19.21), #6 (04.16.21), #7 (05.14.21), #8 (06.17.21), #9 (07.16.21), #10 (09.21.21), #11 (10.29.21), #12 (11.29.21), #13 (12.29.21)  - FA (06.26.20) shows Severe NPDR OU w/ Late leaking MA OU, Enlarged FAZ OU, No NV OU, No significant hyperfluorescence of disc  - BCVA: OD decreased to 20/25 -2 from 20/20; OS stable at 20/50  - OCT shows interval improvement in IRF OU  - recommend IVA OD #5 and IVE OS #15 today (sample), 02.02.22 -- full Eylea authorization pending for 2022  - pt wishes to proceed  -  RBA of procedure discussed, questions answered  - informed consent obtained  - Eylea informed consent form signed and scanned on 01.18.2021  - see procedure note  - [id number] Benefits Investigation initiated 10.16.2020 -- pending for 2022  - f/u in 5 weeks -- DFE/OCT/possible injection OU  2. Retinal Edema OU  - based on FA, suspect majority of macular edema  due to DM as above, but there may be some edema secondary to post-op CME  - cont PF qid OU and ketorolac qid OU   - history of difficulty with compliance at work  - monitor  - f/u in 5 weeks  3. Choroidal Nevus OS  - located at 1200, mild elevation, +drusen, no SRF  - baseline optos pictures obtained, 06.26.20  - discussed possible referral to ocular oncology at [facility] for evaluation/management  4,5.Hypertensive retinopathy OU  - discussed importance of tight BP control  - monitor  6. Pseudophakia OU  - s/p CE/IOL OU (OD on 06.03.20 and OS 06.10.20 by Dr. Gershon Crane)  - beautiful surgeries w/ IOLs in excellent position  - macular edema limiting vision as above  - monitor  7. Ocular hypertension OD  - IOP 12 OD  - cont Cosopt BID OD  - monitor   Ophthalmic Meds Ordered this visit:  Meds ordered this encounter  Medications  . dorzolamide-timolol (COSOPT) 22.3-6.8 MG/ML ophthalmic solution    Sig: Place 1 drop into the right eye 2 (two) times daily.    Dispense:  10 mL    Refill:  10  . prednisoLONE acetate (PRED FORTE) 1 % ophthalmic suspension    Sig: Place 1 drop into both eyes 4 (four) times daily.    Dispense:  10 mL    Refill:  10  . ketorolac (ACULAR) 0.5 % ophthalmic solution    Sig: Place 1 drop into both eyes 4 (four) times daily.    Dispense:  10 mL    Refill:  10  . aflibercept (EYLEA) SOLN 2 mg  . Bevacizumab (AVASTIN) SOLN 1.25 mg      Return for f/u 4-5 weeks, NPDR OU, DFE, OCT.  There are no Patient Instructions on file for this visit.  This document serves as a record of services personally performed by Gardiner Sleeper, MD, PhD. It was created on their behalf by Roselee Nova, COMT. The creation of this record is the provider's dictation and/or activities during the visit.  Electronically signed by: Roselee Nova, COMT 03/05/20 11:09 PM   This document serves as a record of services personally performed by Gardiner Sleeper, MD, PhD. It was  created on their behalf by San Jetty. Owens Shark, OA an ophthalmic technician. The creation of this record is the provider's dictation and/or activities during the visit.    Electronically signed by: San Jetty. Owens Shark, [place] 02.02.2022 11:09 PM   Gardiner Sleeper, M.D., Ph.D. Diseases & Surgery of the Retina and Vitreous [facility]  I have reviewed the above documentation for accuracy and completeness, and I agree with the above. Gardiner Sleeper, M.D., Ph.D. 03/05/20 11:09 PM   Abbreviations: M myopia (nearsighted); A astigmatism; H hyperopia (farsighted); P presbyopia; Mrx spectacle prescription;  CTL contact lenses; OD right eye; OS left eye; OU both eyes  XT exotropia; ET esotropia; PEK punctate epithelial keratitis; PEE punctate epithelial erosions; DES dry eye syndrome; MGD meibomian gland dysfunction; ATs artificial tears; PFAT's preservative free artificial tears; [place] nuclear sclerotic cataract; PSC posterior subcapsular cataract;  ERM epi-retinal membrane; PVD posterior vitreous detachment; RD retinal detachment; DM diabetes mellitus; DR diabetic retinopathy; NPDR non-proliferative diabetic retinopathy; PDR proliferative diabetic retinopathy; CSME clinically significant macular edema; DME diabetic macular edema; dbh dot blot hemorrhages; CWS cotton wool spot; POAG primary open angle glaucoma; C/D cup-to-disc ratio; HVF humphrey visual field; GVF goldmann visual field; OCT optical coherence tomography; IOP intraocular pressure; BRVO Branch retinal vein occlusion; CRVO central retinal vein occlusion; CRAO central retinal artery occlusion; BRAO branch retinal artery occlusion; RT retinal tear; SB scleral buckle; PPV pars plana vitrectomy; VH Vitreous hemorrhage; PRP panretinal laser photocoagulation; IVK intravitreal kenalog; VMT vitreomacular traction; MH Macular hole;  NVD neovascularization of the disc; NVE neovascularization elsewhere; AREDS age related eye disease study; ARMD age  related macular degeneration; POAG primary open angle glaucoma; EBMD epithelial/anterior basement membrane dystrophy; ACIOL anterior chamber intraocular lens; IOL intraocular lens; PCIOL posterior chamber intraocular lens; Phaco/IOL phacoemulsification with intraocular lens placement; [place] photorefractive keratectomy; LASIK laser assisted in situ keratomileusis; HTN hypertension; DM diabetes mellitus; COPD chronic obstructive pulmonary disease

## 2020-03-05 ENCOUNTER — Encounter (INDEPENDENT_AMBULATORY_CARE_PROVIDER_SITE_OTHER): Payer: Self-pay | Admitting: Ophthalmology

## 2020-03-05 ENCOUNTER — Other Ambulatory Visit: Payer: Self-pay

## 2020-03-05 ENCOUNTER — Ambulatory Visit (INDEPENDENT_AMBULATORY_CARE_PROVIDER_SITE_OTHER): Payer: BC Managed Care – PPO | Admitting: Ophthalmology

## 2020-03-05 DIAGNOSIS — Z961 Presence of intraocular lens: Secondary | ICD-10-CM

## 2020-03-05 DIAGNOSIS — H40051 Ocular hypertension, right eye: Secondary | ICD-10-CM

## 2020-03-05 DIAGNOSIS — H3581 Retinal edema: Secondary | ICD-10-CM | POA: Diagnosis not present

## 2020-03-05 DIAGNOSIS — E113413 Type 2 diabetes mellitus with severe nonproliferative diabetic retinopathy with macular edema, bilateral: Secondary | ICD-10-CM | POA: Diagnosis not present

## 2020-03-05 DIAGNOSIS — H35033 Hypertensive retinopathy, bilateral: Secondary | ICD-10-CM

## 2020-03-05 DIAGNOSIS — I10 Essential (primary) hypertension: Secondary | ICD-10-CM

## 2020-03-05 DIAGNOSIS — D3132 Benign neoplasm of left choroid: Secondary | ICD-10-CM | POA: Diagnosis not present

## 2020-03-05 MED ORDER — PREDNISOLONE ACETATE 1 % OP SUSP: 1.0000 [drp] | Freq: Four times a day (QID) | OPHTHALMIC | 10 refills | Status: DC

## 2020-03-05 MED ORDER — KETOROLAC TROMETHAMINE 0.5 % OP SOLN: 1.0000 [drp] | Freq: Four times a day (QID) | OPHTHALMIC | 10 refills | Status: AC

## 2020-03-05 MED ORDER — BEVACIZUMAB CHEMO INJECTION 1.25MG/0.05ML SYRINGE FOR KALEIDOSCOPE
1.2500 mg | INTRAVITREAL | Status: AC | PRN
  Administered 2020-03-05: 1.25 mg via INTRAVITREAL

## 2020-03-05 MED ORDER — DORZOLAMIDE HCL-TIMOLOL MAL 2-0.5 % OP SOLN: 1.0000 [drp] | Freq: Two times a day (BID) | OPHTHALMIC | 10 refills | Status: AC

## 2020-03-05 MED ORDER — AFLIBERCEPT 2MG/0.05ML IZ SOLN FOR KALEIDOSCOPE
2.0000 mg | INTRAVITREAL | Status: AC | PRN
  Administered 2020-03-05: 2 mg via INTRAVITREAL

## 2020-03-26 DIAGNOSIS — J411 Mucopurulent chronic bronchitis: Secondary | ICD-10-CM | POA: Diagnosis not present

## 2020-04-06 ENCOUNTER — Other Ambulatory Visit: Payer: Self-pay | Admitting: Internal Medicine

## 2020-04-06 ENCOUNTER — Other Ambulatory Visit: Payer: Self-pay | Admitting: Urology

## 2020-04-09 ENCOUNTER — Ambulatory Visit (INDEPENDENT_AMBULATORY_CARE_PROVIDER_SITE_OTHER): Payer: BC Managed Care – PPO | Admitting: Ophthalmology

## 2020-04-09 ENCOUNTER — Other Ambulatory Visit: Payer: Self-pay

## 2020-04-09 ENCOUNTER — Encounter (INDEPENDENT_AMBULATORY_CARE_PROVIDER_SITE_OTHER): Payer: Self-pay | Admitting: Ophthalmology

## 2020-04-09 DIAGNOSIS — H35033 Hypertensive retinopathy, bilateral: Secondary | ICD-10-CM

## 2020-04-09 DIAGNOSIS — H3581 Retinal edema: Secondary | ICD-10-CM

## 2020-04-09 DIAGNOSIS — D3132 Benign neoplasm of left choroid: Secondary | ICD-10-CM | POA: Diagnosis not present

## 2020-04-09 DIAGNOSIS — I10 Essential (primary) hypertension: Secondary | ICD-10-CM

## 2020-04-09 DIAGNOSIS — Z961 Presence of intraocular lens: Secondary | ICD-10-CM

## 2020-04-09 DIAGNOSIS — E113413 Type 2 diabetes mellitus with severe nonproliferative diabetic retinopathy with macular edema, bilateral: Secondary | ICD-10-CM

## 2020-04-09 DIAGNOSIS — H40051 Ocular hypertension, right eye: Secondary | ICD-10-CM

## 2020-04-09 MED ORDER — AFLIBERCEPT 2MG/0.05ML IZ SOLN FOR KALEIDOSCOPE
2.0000 mg | INTRAVITREAL | Status: AC | PRN
  Administered 2020-04-09: 2 mg via INTRAVITREAL

## 2020-04-09 NOTE — Progress Notes (Signed)
[facility] Note  04/09/2020     CHIEF COMPLAINT Patient presents for Retina Follow Up   HISTORY OF PRESENT ILLNESS: Juan [name] is a 60 y.o. male who presents to the clinic today for:   HPI    Retina Follow Up    Patient presents with  Diabetic Retinopathy.  In both eyes.  This started years ago.  Severity is severe.  Duration of 5 weeks.  Since onset it is stable.  I, the attending physician,  performed the HPI with the patient and updated documentation appropriately.          Comments    60 y/o male pt here for 5 wk f/u for severe NPDR w/DME OU.  Feels [facility] OD may be a bit worse than last visit.  No change in [place]  OS.  Denies pain, FOL, floaters.  BS 140 this a.m.  A1C unknown.  PF and Ketorolac QID OU, Cosopt BID OD.  Pt has been on Prednisone recovering from bronchitis.       Last edited by Bernarda Caffey, MD on 04/09/2020  4:54 PM. (History)    pt states vision is a a little weaker OD.  Referring physician: Rutherford Guys, [place],  [place] [address]  HISTORICAL INFORMATION:   Selected notes from the MEDICAL RECORD NUMBER Referred by Dr. Rutherford Guys for concern of CME s/p cataract sx   CURRENT MEDICATIONS: Current Outpatient Medications (Ophthalmic Drugs)  Medication Sig  . dorzolamide-timolol (COSOPT) 22.3-6.8 MG/ML ophthalmic solution Place 1 drop into the right eye 2 (two) times daily.  Marland Kitchen ketorolac (ACULAR) 0.5 % ophthalmic solution Place 1 drop into both eyes 4 (four) times daily.  . prednisoLONE acetate (PRED FORTE) 1 % ophthalmic suspension Place 1 drop into both eyes 4 (four) times daily.   No current facility-administered medications for this visit. (Ophthalmic Drugs)   Current Outpatient Medications (Other)  Medication Sig  . ADVAIR DISKUS 500-50 MCG/DOSE AEPB Inhale 1 puff into the lungs 2 (two) times daily.  Marland Kitchen albuterol (VENTOLIN HFA) 108 (90 Base) MCG/ACT inhaler Inhale 1-2 puffs into the lungs every 6  (six) hours as needed for wheezing or shortness of breath.  . alfuzosin (UROXATRAL) 10 MG 24 hr tablet TAKE 1 TABLET BY MOUTH TWICE A DAY  . aspirin EC 81 MG tablet Take 81 mg by mouth daily.  Marland Kitchen atorvastatin (LIPITOR) 40 MG tablet Take 40 mg by mouth daily.  . carvedilol (COREG) 6.25 MG tablet TAKE 1 TABLET (6.25 MG TOTAL) BY MOUTH 2 (TWO) TIMES DAILY WITH A MEAL.  . Empagliflozin-metFORMIN HCl (SYNJARDY) 12.06-998 MG TABS Take 1 tablet by mouth 2 (two) times a day.   . esomeprazole (NEXIUM) 40 MG capsule Take 40 mg by mouth daily at 12 noon.  . insulin glargine, 2 Unit Dial, (TOUJEO MAX SOLOSTAR) 300 UNIT/ML Solostar Pen Inject 40 Units into the skin daily.   . iron polysaccharides (NIFEREX) 150 MG capsule Take 150 mg by mouth daily.  Marland Kitchen lisinopril (ZESTRIL) 40 MG tablet Take 40 mg by mouth daily.  . pantoprazole (PROTONIX) 40 MG tablet Take 40 mg by mouth daily.  . predniSONE (STERAPRED UNI-PAK 21 TAB) 10 MG (21) TBPK tablet Take by mouth daily. Take as directed  . Semaglutide,0.25 or 0.5MG /DOS, (OZEMPIC, 0.25 OR 0.5 MG/DOSE,) 2 MG/1.5ML SOPN Inject into the skin.  Marland Kitchen SPIRIVA RESPIMAT 2.5 MCG/ACT AERS INHALE 2 PUFFS BY MOUTH INTO THE LUNGS DAILY   No current  facility-administered medications for this visit. (Other)      REVIEW OF SYSTEMS: ROS    Positive for: Gastrointestinal, Musculoskeletal, Endocrine, Eyes, Respiratory   Negative for: Constitutional, Neurological, Skin, Genitourinary, HENT, Cardiovascular, Psychiatric, Allergic/Imm, Heme/Lymph   Last edited by Matthew Folks, COA on 04/09/2020  3:11 PM. (History)       ALLERGIES Allergies  Allergen Reactions  . Naproxen Shortness Of Breath  . Naproxen Sodium Shortness Of Breath  . Penicillins Rash    Has patient had a PCN reaction causing immediate rash, facial/tongue/throat swelling, SOB or lightheadedness with hypotension: Yes Has patient had a PCN reaction causing severe rash involving mucus membranes or skin necrosis:  No Has patient had a PCN reaction that required hospitalization No Has patient had a PCN reaction occurring within the last 10 years: No If all of the above answers are "NO", then may proceed with Cephalosporin use.  Has patient had a PCN reaction causing immediate rash, facial/tongue/throat swelling, SOB or lightheadedness with hypotension: Yes Has patient had a PCN reaction causing severe rash involving mucus membranes or skin necrosis: No Has patient had a PCN reaction that required hospitalization No Has patient had a PCN reaction occurring within the last 10 years: No If all of the above answers are "NO", then may proceed with Cephalosporin use.     PAST MEDICAL HISTORY Past Medical History:  Diagnosis Date  . Arthritis   . Asthma   . COPD (chronic obstructive pulmonary disease) ([place])   . Diabetes mellitus    Type 2  . Diabetic retinopathy ([place])    NPDR OU  . GERD (gastroesophageal reflux disease)   . H/O hiatal hernia   . History of kidney stones   . Hyperlipemia   . Hypertension   . Hypertensive retinopathy    OU  . Nasal polyps   . Pneumonia 2014  . Shortness of breath   . Sleep apnea    pt has CPAP but is unable to use it d/t having polyps in his nose. Pt stated "I need to call and get a CPAP mask instead"  . Stomach ulcer    Past Surgical History:  Procedure Laterality Date  . CATARACT EXTRACTION Right 07/05/2018   Dr. Gershon Crane  . CATARACT EXTRACTION Left 07/12/2018   Dr. Gershon Crane  . CHOLECYSTECTOMY N/A 01/23/2015   Procedure: LAPAROSCOPIC CHOLECYSTECTOMY ;  Surgeon: Georganna Skeans, MD;  Location: [facility];  Service: General;  Laterality: N/A;  . COLONOSCOPY W/ POLYPECTOMY    . ESOPHAGOGASTRODUODENOSCOPY    . EYE SURGERY    . KNEE ARTHROSCOPY  03/24/2011   Procedure: ARTHROSCOPY KNEE;  Surgeon: Alta Corning, MD;  Location: [facility];  Service: Orthopedics;  Laterality: Right;  partial medial menisectomy, chondroplaty patella, and medial medial  plica  . LEFT HEART CATH AND CORONARY ANGIOGRAPHY N/A 06/29/2019   Procedure: LEFT HEART CATH AND CORONARY ANGIOGRAPHY;  Surgeon: Wellington Hampshire, MD;  Location: [facility];  Service: Cardiovascular;  Laterality: N/A;    FAMILY HISTORY Family History  Problem Relation Age of Onset  . Diabetes Father     SOCIAL HISTORY Social History   Tobacco Use  . Smoking status: Former Smoker    Packs/day: 3.00    Years: 20.00    Pack years: 60.00    Types: Cigarettes  . Smokeless tobacco: Current User    Types: Chew  . Tobacco comment: 1 can/daily  Substance Use Topics  . Alcohol use: Yes  Comment: social, 1x weekly  . Drug use: No         OPHTHALMIC EXAM:  Base Eye Exam    Visual Acuity (Snellen - Linear)      Right Left   Dist [place] 20/25 -2 20/50 -2   Dist ph [place] NI NI       Tonometry (Tonopen, 3:14 PM)      Right Left   Pressure 12 12       Pupils      Dark Light Shape React APD   Right 3 2 Round Brisk None   Left 3 2 Round Brisk None       Visual Fields (Counting fingers)      Left Right    Full Full       Extraocular Movement      Right Left    Full, Ortho Full, Ortho       Neuro/Psych    Oriented x3: Yes   Mood/Affect: Normal       Dilation    Both eyes: 1.0% Mydriacyl, 2.5% Phenylephrine @ 3:14 PM        Slit Lamp and Fundus Exam    Slit Lamp Exam      Right Left   Lids/Lashes Dermatochalasis - upper lid, Telangiectasia, mild Meibomian gland dysfunction Dermatochalasis - upper lid, Telangiectasia, mild Meibomian gland dysfunction   Conjunctiva/Sclera White and quiet, +concretions on palpebral conj Mild nasal Pinguecula, +concretions on palpebral conj   Cornea Mild Arcus, Well healed temporal cataract wounds Arcus, 1+ Descemet's folds, Well healed temporal cataract wounds   Anterior Chamber deep, 1+ cell/pigment deep, 1+ cell/pigment   Iris Round and dilated, mild patch of atrophy at 0800, no NVI Round and dilated, No NVI   Lens PC IOL  in good position PC IOL in good position   Vitreous Vitreous syneresis Vitreous syneresis       Fundus Exam      Right Left   Disc Pink and Sharp Pallor, Sharp rim   C/D Ratio 0.3 0.3   Macula Blunted foveal reflex, scattered IRH/DBH/exudates -- greatest temporal macula, trace residual cystic changes remain--slightly increased Blunted foveal reflex, +central edema -- persistent, scattered DBH and MAs, scattered punctate exudates - greatest superior and temporal macula -- persistent   Vessels Vascular attenuation, Tortuous Vascular attenuation, Tortuous   Periphery Attached, scattered MA and exudates greatest posteriorly Attached, pigmented choroidal nevus superiorly with mild elevation, +drusen, no SRF -- unchanged from prior          IMAGING AND PROCEDURES  Imaging and Procedures for @TODAY @  OCT, Retina - OU - Both Eyes       Right Eye Quality was good. Central Foveal Thickness: 291. Progression has worsened. Findings include abnormal foveal contour, intraretinal hyper-reflective material, intraretinal fluid, outer retinal atrophy, no SRF (Interval increase in IRF).   Left Eye Quality was good. Central Foveal Thickness: 257. Progression has been stable. Findings include intraretinal fluid, intraretinal hyper-reflective material, abnormal foveal contour, no SRF, outer retinal atrophy (Persistent IRF--no significant change from prior, Sub-RPE hyper-reflective mass consistent with choroidal nevus--superior to disc, caught on widefield -- stable from prior).   Notes *Images captured and stored on drive  Diagnosis / Impression: Severe DME OU OD: Interval increase in IRF OS: Persistent IRF--no significant change from prior, Sub-RPE hyper-reflective mass consistent with choroidal nevus--superior to disc, caught on widefield -- stable from prior  Clinical management:  See below  Abbreviations: NFP - Normal foveal profile. CME - cystoid macular  edema. PED - pigment epithelial  detachment. IRF - intraretinal fluid. SRF - subretinal fluid. EZ - ellipsoid zone. ERM - epiretinal membrane. ORA - outer retinal atrophy. ORT - outer retinal tubulation. SRHM - subretinal hyper-reflective material        Intravitreal Injection, Pharmacologic Agent - OD - Right Eye       Time Out 04/09/2020. 3:42 PM. Confirmed correct patient, procedure, site, and patient consented.   Anesthesia Topical anesthesia was used. Anesthetic medications included Lidocaine 2%, Proparacaine 0.5%.   Procedure Preparation included 5% betadine to ocular surface, eyelid speculum. A (32g) needle was used.   Injection:  2 mg aflibercept Alfonse Flavors) SOLN   NDC: A3590391, Lot: [id number], Expiration date: 07/31/2020   Route: Intravitreal, Site: Right Eye, Waste: 0.05 mL  Post-op Post injection exam found visual acuity of at least counting fingers. The patient tolerated the procedure well. There were no complications. The patient received written and verbal post procedure care education.        Intravitreal Injection, Pharmacologic Agent - OS - Left Eye       Time Out 04/09/2020. 3:43 PM. Confirmed correct patient, procedure, site, and patient consented.   Anesthesia Topical anesthesia was used. Anesthetic medications included Lidocaine 2%, Proparacaine 0.5%.   Procedure Preparation included 5% betadine to ocular surface, eyelid speculum. A supplied (32g) needle was used.   Injection:  2 mg aflibercept Alfonse Flavors) SOLN   NDC: M7179715, Lot: [id number], Expiration date: 11/30/2020   Route: Intravitreal, Site: Left Eye, Waste: 0.05 mL  Post-op Post injection exam found visual acuity of at least counting fingers. The patient tolerated the procedure well. There were no complications. The patient received written and verbal post procedure care education.                 ASSESSMENT/PLAN:    ICD-10-CM   1. Severe nonproliferative diabetic retinopathy of both eyes with macular edema  associated with type 2 diabetes mellitus (HCC)  C62.3762 Intravitreal Injection, Pharmacologic Agent - OD - Right Eye    Intravitreal Injection, Pharmacologic Agent - OS - Left Eye    aflibercept (EYLEA) SOLN 2 mg    aflibercept (EYLEA) SOLN 2 mg  2. 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 Essential hypertension  I10   5. Hypertensive retinopathy of both eyes  H35.033   6. Pseudophakia of both eyes  Z96.1   7. Ocular hypertension of right eye  H40.051     1. Severe nonproliferative diabetic retinopathy with DME, OU (OS>OD)  - s/p IVA OD #1 (06.26.20), #2 (08.14.20), #3 (09.17.20), #4 (10.16.20), #5 (02.02.22)  - s/p IVA OS #1 (07.17.20), #2 (08.14.20), #3 (09.17.20)  - s/p IVE OS #1 (10.16.20) - sample, #2 (11.13.20), #3 (12.11.20), #4 (01.18.21), #5 (02.19.21), #6 (03.19.21), #7 (04.16.21), #8 (05.14.21), #9 (06.17.21), #10 (7.16.21), #11 (09.21.21), #12 (10.29.21), #13 (11.29.21), #14 (12.29.21), #15 (02.02.22, sample)  - s/p IVE OD #1 (11.13.20), #2 (12.11.20), #3 (01.18.21), #4 (02.19.21), #5 (03.19.21), #6 (04.16.21), #7 (05.14.21), #8 (06.17.21), #9 (07.16.21), #10 (09.21.21), #11 (10.29.21), #12 (11.29.21), #13 (12.29.21)  - FA (06.26.20) shows Severe NPDR OU w/ Late leaking MA OU, Enlarged FAZ OU, No NV OU, No significant hyperfluorescence of disc  - BCVA: OD decreased to 20/25 -2 from 20/20; OS stable at 20/50  - OCT shows OD: Interval increase in IRF  OS: Persistent IRF--no significant change from prior, Sub-RPE hyper-reflective mass consistent with choroidal nevus--superior to disc, caught on widefield -- stable from prior  - recommend IVE OD #14 and IVE OS #16 today, 03.09.22 -- full Eylea authorization approved for 2022  - pt wishes to proceed  - RBA of procedure discussed, questions answered  - informed consent obtained  - Eylea informed consent form signed and scanned on 01.18.2021  -  see procedure note  - [email or web address] Benefits Investigation initiated 10.16.2020 -- approved for 2022  - f/u in 4-5 weeks -- DFE/OCT/possible injection OU  2. Retinal Edema OU  - based on FA, suspect majority of macular edema due to DM as above, but there may be some edema secondary to post-op CME  - cont PF qid OU and ketorolac qid OU   - history of difficulty with compliance at work  - monitor  - f/u in 5 weeks  3. Choroidal Nevus OS  - located at 1200, mild elevation, +drusen, no SRF  - baseline optos pictures obtained, 06.26.20  - discussed possible referral to ocular oncology at [facility] for evaluation  4,5.Hypertensive retinopathy OU  - discussed importance of tight BP control  - monitor  6. Pseudophakia OU  - s/p CE/IOL OU (OD on 06.03.20 and OS 06.10.20 by Dr. Gershon Crane)  - beautiful surgeries w/ IOLs in excellent position  - macular edema limiting vision as above  - monitor  7. Ocular hypertension OD  - IOP 12 OD  - cont Cosopt BID OD  - monitor   Ophthalmic Meds Ordered this visit:  Meds ordered this encounter  Medications  . aflibercept (EYLEA) SOLN 2 mg  . aflibercept (EYLEA) SOLN 2 mg      Return for 4-5 wks for NPDR w/DME OU w/DFE/OCT/likely inj..  There are no Patient Instructions on file for this visit.  This document serves as a record of services personally performed by Gardiner Sleeper, MD, PhD. It was created on their behalf by Roselee Nova, COMT. The creation of this record is the provider's dictation and/or activities during the visit.  Electronically signed by: Roselee Nova, COMT 04/09/20 4:58 PM   This document serves as a record of services personally performed by Gardiner Sleeper, MD, PhD. It was created on their behalf by San Jetty. Owens Shark, OA an ophthalmic technician. The creation of this record is the provider's dictation and/or activities during the visit.    Electronically signed by: San Jetty. Owens Shark, [place] 03.09.2022 4:58 PM   This document  serves as a record of services personally performed by Gardiner Sleeper, MD, PhD. It was created on their behalf by Estill Bakes, COT an ophthalmic technician. The creation of this record is the provider's dictation and/or activities during the visit.    Electronically signed by: Estill Bakes, COT 3.9.22 @ 4:58 PM  Gardiner Sleeper, M.D., Ph.D. Diseases & Surgery of the Retina and [place] 3.9.22  I have reviewed the above documentation for accuracy and completeness, and I agree with the above. Gardiner Sleeper, M.D., Ph.D. 04/09/20 4:58 PM   Abbreviations: M myopia (nearsighted); A astigmatism; H hyperopia (farsighted); P presbyopia; Mrx spectacle prescription;  CTL contact lenses; OD right eye; OS left eye; OU both eyes  XT exotropia; ET esotropia; PEK punctate epithelial keratitis; PEE punctate epithelial erosions; DES dry eye syndrome; MGD meibomian gland dysfunction; ATs artificial tears; PFAT's preservative free artificial tears; [place] nuclear sclerotic cataract; PSC posterior subcapsular  cataract; ERM epi-retinal membrane; PVD posterior vitreous detachment; RD retinal detachment; DM diabetes mellitus; DR diabetic retinopathy; NPDR non-proliferative diabetic retinopathy; PDR proliferative diabetic retinopathy; CSME clinically significant macular edema; DME diabetic macular edema; dbh dot blot hemorrhages; CWS cotton wool spot; POAG primary open angle glaucoma; C/D cup-to-disc ratio; HVF humphrey visual field; GVF goldmann visual field; OCT optical coherence tomography; IOP intraocular pressure; BRVO Branch retinal vein occlusion; CRVO central retinal vein occlusion; CRAO central retinal artery occlusion; BRAO branch retinal artery occlusion; RT retinal tear; SB scleral buckle; PPV pars plana vitrectomy; VH Vitreous hemorrhage; PRP panretinal laser photocoagulation; IVK intravitreal kenalog; VMT vitreomacular traction; MH Macular hole;  NVD neovascularization of the  disc; NVE neovascularization elsewhere; AREDS age related eye disease study; ARMD age related macular degeneration; POAG primary open angle glaucoma; EBMD epithelial/anterior basement membrane dystrophy; ACIOL anterior chamber intraocular lens; IOL intraocular lens; PCIOL posterior chamber intraocular lens; Phaco/IOL phacoemulsification with intraocular lens placement; [place] photorefractive keratectomy; LASIK laser assisted in situ keratomileusis; HTN hypertension; DM diabetes mellitus; COPD chronic obstructive pulmonary disease

## 2020-04-14 NOTE — Telephone Encounter (Signed)
°*  STAT* If patient is at the pharmacy, call can be transferred to refill team.   1. Which medications need to be refilled? (please list name of each medication and dose if known)   carvedilol (COREG) 6.25 MG tablet [[id number]]   2. Which pharmacy/location (including street and city if local pharmacy) is medication to be sent to?   [id number] [address], [address] [place] [address]  Phone:  [phone] Fax:  [phone]   3. Do they need a 30 day or 90 day supply? [place]

## 2020-04-15 DIAGNOSIS — E1129 Type 2 diabetes mellitus with other diabetic kidney complication: Secondary | ICD-10-CM | POA: Diagnosis not present

## 2020-04-15 DIAGNOSIS — E113413 Type 2 diabetes mellitus with severe nonproliferative diabetic retinopathy with macular edema, bilateral: Secondary | ICD-10-CM | POA: Diagnosis not present

## 2020-04-15 DIAGNOSIS — Z794 Long term (current) use of insulin: Secondary | ICD-10-CM | POA: Diagnosis not present

## 2020-04-15 DIAGNOSIS — E1165 Type 2 diabetes mellitus with hyperglycemia: Secondary | ICD-10-CM | POA: Diagnosis not present

## 2020-04-15 DIAGNOSIS — E1140 Type 2 diabetes mellitus with diabetic neuropathy, unspecified: Secondary | ICD-10-CM | POA: Diagnosis not present

## 2020-04-15 DIAGNOSIS — E1169 Type 2 diabetes mellitus with other specified complication: Secondary | ICD-10-CM | POA: Diagnosis not present

## 2020-05-07 ENCOUNTER — Other Ambulatory Visit: Payer: Self-pay

## 2020-05-07 ENCOUNTER — Encounter (INDEPENDENT_AMBULATORY_CARE_PROVIDER_SITE_OTHER): Payer: Self-pay | Admitting: Ophthalmology

## 2020-05-07 ENCOUNTER — Ambulatory Visit (INDEPENDENT_AMBULATORY_CARE_PROVIDER_SITE_OTHER): Payer: BC Managed Care – PPO | Admitting: Ophthalmology

## 2020-05-07 DIAGNOSIS — H35033 Hypertensive retinopathy, bilateral: Secondary | ICD-10-CM

## 2020-05-07 DIAGNOSIS — I10 Essential (primary) hypertension: Secondary | ICD-10-CM

## 2020-05-07 DIAGNOSIS — E113413 Type 2 diabetes mellitus with severe nonproliferative diabetic retinopathy with macular edema, bilateral: Secondary | ICD-10-CM

## 2020-05-07 DIAGNOSIS — Z961 Presence of intraocular lens: Secondary | ICD-10-CM

## 2020-05-07 DIAGNOSIS — D3132 Benign neoplasm of left choroid: Secondary | ICD-10-CM | POA: Diagnosis not present

## 2020-05-07 DIAGNOSIS — H3581 Retinal edema: Secondary | ICD-10-CM | POA: Diagnosis not present

## 2020-05-07 DIAGNOSIS — H40051 Ocular hypertension, right eye: Secondary | ICD-10-CM

## 2020-05-07 MED ORDER — AFLIBERCEPT 2MG/0.05ML IZ SOLN FOR KALEIDOSCOPE
2.0000 mg | INTRAVITREAL | Status: AC | PRN
  Administered 2020-05-07: 2 mg via INTRAVITREAL

## 2020-05-07 NOTE — Progress Notes (Signed)
[facility] Note  05/07/2020     CHIEF COMPLAINT Patient presents for Retina Follow Up   HISTORY OF PRESENT ILLNESS: Juan [name] is a 60 y.o. male who presents to the clinic today for:   HPI    Retina Follow Up    Patient presents with  Diabetic Retinopathy.  In both eyes.  This started 4 weeks ago.  I, the attending physician,  performed the HPI with the patient and updated documentation appropriately.          Comments    Pt here for 4-5wk retinal follow up for NPDR. Pt states no changes since last visit, vision seems the same. No eye pain.        Last edited by Bernarda Caffey, MD on 05/07/2020  5:20 PM. (History)    pt states no change in vision  Referring physician: Harlan Stains, MD [place],  [place] [address]  HISTORICAL INFORMATION:   Selected notes from the MEDICAL RECORD NUMBER Referred by Dr. Rutherford Guys for concern of CME s/p cataract sx   CURRENT MEDICATIONS: Current Outpatient Medications (Ophthalmic Drugs)  Medication Sig  . dorzolamide-timolol (COSOPT) 22.3-6.8 MG/ML ophthalmic solution Place 1 drop into the right eye 2 (two) times daily.  Marland Kitchen ketorolac (ACULAR) 0.5 % ophthalmic solution Place 1 drop into both eyes 4 (four) times daily.  . prednisoLONE acetate (PRED FORTE) 1 % ophthalmic suspension Place 1 drop into both eyes 4 (four) times daily.   No current facility-administered medications for this visit. (Ophthalmic Drugs)   Current Outpatient Medications (Other)  Medication Sig  . ADVAIR DISKUS 500-50 MCG/DOSE AEPB Inhale 1 puff into the lungs 2 (two) times daily.  Marland Kitchen albuterol (VENTOLIN HFA) 108 (90 Base) MCG/ACT inhaler Inhale 1-2 puffs into the lungs every 6 (six) hours as needed for wheezing or shortness of breath.  . alfuzosin (UROXATRAL) 10 MG 24 hr tablet TAKE 1 TABLET BY MOUTH TWICE A DAY  . aspirin EC 81 MG tablet Take 81 mg by mouth daily.  Marland Kitchen atorvastatin (LIPITOR) 40 MG tablet Take  40 mg by mouth daily.  . carvedilol (COREG) 6.25 MG tablet TAKE 1 TABLET BY MOUTH TWICE A DAY WITH A MEAL  . Empagliflozin-metFORMIN HCl (SYNJARDY) 12.06-998 MG TABS Take 1 tablet by mouth 2 (two) times a day.   . esomeprazole (NEXIUM) 40 MG capsule Take 40 mg by mouth daily at 12 noon.  . insulin glargine, 2 Unit Dial, (TOUJEO MAX SOLOSTAR) 300 UNIT/ML Solostar Pen Inject 40 Units into the skin daily.   . iron polysaccharides (NIFEREX) 150 MG capsule Take 150 mg by mouth daily.  Marland Kitchen lisinopril (ZESTRIL) 40 MG tablet Take 40 mg by mouth daily.  . pantoprazole (PROTONIX) 40 MG tablet Take 40 mg by mouth daily.  . predniSONE (STERAPRED UNI-PAK 21 TAB) 10 MG (21) TBPK tablet Take by mouth daily. Take as directed  . Semaglutide,0.25 or 0.5MG /DOS, (OZEMPIC, 0.25 OR 0.5 MG/DOSE,) 2 MG/1.5ML SOPN Inject into the skin.  Marland Kitchen SPIRIVA RESPIMAT 2.5 MCG/ACT AERS INHALE 2 PUFFS BY MOUTH INTO THE LUNGS DAILY   No current facility-administered medications for this visit. (Other)      REVIEW OF SYSTEMS: ROS    Positive for: Gastrointestinal, Musculoskeletal, Endocrine, Eyes, Respiratory   Negative for: Constitutional, Neurological, Skin, Genitourinary, HENT, Cardiovascular, Psychiatric, Allergic/Imm, Heme/Lymph   Last edited by Kingsley Spittle, COT on 05/07/2020  2:45 PM. (History)  ALLERGIES Allergies  Allergen Reactions  . Naproxen Shortness Of Breath  . Naproxen Sodium Shortness Of Breath  . Penicillins Rash    Has patient had a PCN reaction causing immediate rash, facial/tongue/throat swelling, SOB or lightheadedness with hypotension: Yes Has patient had a PCN reaction causing severe rash involving mucus membranes or skin necrosis: No Has patient had a PCN reaction that required hospitalization No Has patient had a PCN reaction occurring within the last 10 years: No If all of the above answers are "NO", then may proceed with Cephalosporin use.  Has patient had a PCN reaction causing  immediate rash, facial/tongue/throat swelling, SOB or lightheadedness with hypotension: Yes Has patient had a PCN reaction causing severe rash involving mucus membranes or skin necrosis: No Has patient had a PCN reaction that required hospitalization No Has patient had a PCN reaction occurring within the last 10 years: No If all of the above answers are "NO", then may proceed with Cephalosporin use.     PAST MEDICAL HISTORY Past Medical History:  Diagnosis Date  . Arthritis   . Asthma   . COPD (chronic obstructive pulmonary disease) ([place])   . Diabetes mellitus    Type 2  . Diabetic retinopathy ([place])    NPDR OU  . GERD (gastroesophageal reflux disease)   . H/O hiatal hernia   . History of kidney stones   . Hyperlipemia   . Hypertension   . Hypertensive retinopathy    OU  . Nasal polyps   . Pneumonia 2014  . Shortness of breath   . Sleep apnea    pt has CPAP but is unable to use it d/t having polyps in his nose. Pt stated "I need to call and get a CPAP mask instead"  . Stomach ulcer    Past Surgical History:  Procedure Laterality Date  . CATARACT EXTRACTION Right 07/05/2018   Dr. Gershon Crane  . CATARACT EXTRACTION Left 07/12/2018   Dr. Gershon Crane  . CHOLECYSTECTOMY N/A 01/23/2015   Procedure: LAPAROSCOPIC CHOLECYSTECTOMY ;  Surgeon: Georganna Skeans, MD;  Location: [facility];  Service: General;  Laterality: N/A;  . COLONOSCOPY W/ POLYPECTOMY    . ESOPHAGOGASTRODUODENOSCOPY    . EYE SURGERY    . KNEE ARTHROSCOPY  03/24/2011   Procedure: ARTHROSCOPY KNEE;  Surgeon: Alta Corning, MD;  Location: [facility];  Service: Orthopedics;  Laterality: Right;  partial medial menisectomy, chondroplaty patella, and medial medial plica  . LEFT HEART CATH AND CORONARY ANGIOGRAPHY N/A 06/29/2019   Procedure: LEFT HEART CATH AND CORONARY ANGIOGRAPHY;  Surgeon: Wellington Hampshire, MD;  Location: [facility];  Service: Cardiovascular;  Laterality: N/A;    FAMILY HISTORY Family  History  Problem Relation Age of Onset  . Diabetes Father     SOCIAL HISTORY Social History   Tobacco Use  . Smoking status: Former Smoker    Packs/day: 3.00    Years: 20.00    Pack years: 60.00    Types: Cigarettes  . Smokeless tobacco: Current User    Types: Chew  . Tobacco comment: 1 can/daily  Substance Use Topics  . Alcohol use: Yes    Comment: social, 1x weekly  . Drug use: No         OPHTHALMIC EXAM:  Base Eye Exam    Visual Acuity (Snellen - Linear)      Right Left   Dist [place] 20/30 +1 20/50 -2   Dist ph [place] 20/25 -1 NI  Tonometry (Tonopen, 2:55 PM)      Right Left   Pressure 15 13       Pupils      Dark Light Shape React APD   Right 3 2 Round Brisk None   Left 3 2 Round Brisk None       Visual Fields (Counting fingers)      Left Right    Full Full       Extraocular Movement      Right Left    Full, Ortho Full, Ortho       Neuro/Psych    Oriented x3: Yes   Mood/Affect: Normal       Dilation    Both eyes: 1.0% Mydriacyl, 2.5% Phenylephrine @ 2:56 PM        Slit Lamp and Fundus Exam    Slit Lamp Exam      Right Left   Lids/Lashes Dermatochalasis - upper lid, Telangiectasia, mild Meibomian gland dysfunction Dermatochalasis - upper lid, Telangiectasia, mild Meibomian gland dysfunction   Conjunctiva/Sclera White and quiet, +concretions on palpebral conj Mild nasal Pinguecula, +concretions on palpebral conj   Cornea Mild Arcus, Well healed temporal cataract wounds Arcus, 1+ Descemet's folds, Well healed temporal cataract wounds   Anterior Chamber deep, 1+ cell/pigment deep, 1+ cell/pigment   Iris Round and dilated, mild patch of atrophy at 0800, no NVI Round and dilated, No NVI   Lens PC IOL in good position PC IOL in good position   Vitreous Vitreous syneresis Vitreous syneresis       Fundus Exam      Right Left   Disc Pink and Sharp Pallor, Sharp rim   C/D Ratio 0.3 0.3   Macula Blunted foveal reflex, scattered IRH/DBH/exudates  -- greatest temporal macula - improved, trace residual cystic changes remain Blunted foveal reflex, +central edema -- persistent, scattered DBH and MAs, scattered punctate exudates - greatest superior and temporal macula -- persistent   Vessels Vascular attenuation, Tortuous Vascular attenuation, Tortuous   Periphery Attached, scattered MA and exudates greatest posteriorly Attached, pigmented choroidal nevus superiorly with mild elevation, +drusen, no SRF or orage pigment -- unchanged from prior          IMAGING AND PROCEDURES  Imaging and Procedures for @TODAY @  OCT, Retina - OU - Both Eyes       Right Eye Quality was good. Central Foveal Thickness: 266. Progression has improved. Findings include abnormal foveal contour, intraretinal hyper-reflective material, intraretinal fluid, outer retinal atrophy, no SRF (Interval improvement in IRF).   Left Eye Quality was good. Central Foveal Thickness: 252. Progression has been stable. Findings include intraretinal fluid, intraretinal hyper-reflective material, abnormal foveal contour, no SRF, outer retinal atrophy (Persistent IRHM greatest temporal macula, Sub-RPE hyper-reflective mass consistent with choroidal nevus--superior to disc, caught on widefield -- stable from prior).   Notes *Images captured and stored on drive  Diagnosis / Impression: Severe DME OU OD: Interval improvement in IRF OS: Persistent IRHM greatest temporal macula, Sub-RPE hyper-reflective mass consistent with choroidal nevus--superior to disc, caught on widefield -- stable from prior  Clinical management:  See below  Abbreviations: NFP - Normal foveal profile. CME - cystoid macular edema. PED - pigment epithelial detachment. IRF - intraretinal fluid. SRF - subretinal fluid. EZ - ellipsoid zone. ERM - epiretinal membrane. ORA - outer retinal atrophy. ORT - outer retinal tubulation. SRHM - subretinal hyper-reflective material        Intravitreal Injection,  Pharmacologic Agent - OD - Right Eye  Time Out 05/07/2020. 3:54 PM. Confirmed correct patient, procedure, site, and patient consented.   Anesthesia Topical anesthesia was used. Anesthetic medications included Lidocaine 2%, Proparacaine 0.5%.   Procedure Preparation included 5% betadine to ocular surface, eyelid speculum. A (32g) needle was used.   Injection:  2 mg aflibercept Alfonse Flavors) SOLN   NDC: A3590391, Lot: [id number], Expiration date: 10/31/2020   Route: Intravitreal, Site: Right Eye, Waste: 0.05 mL  Post-op Post injection exam found visual acuity of at least counting fingers. The patient tolerated the procedure well. There were no complications. The patient received written and verbal post procedure care education.        Intravitreal Injection, Pharmacologic Agent - OS - Left Eye       Time Out 05/07/2020. 3:54 PM. Confirmed correct patient, procedure, site, and patient consented.   Anesthesia Topical anesthesia was used. Anesthetic medications included Lidocaine 2%, Proparacaine 0.5%.   Procedure Preparation included 5% betadine to ocular surface, eyelid speculum. A supplied (32g) needle was used.   Injection:  2 mg aflibercept Alfonse Flavors) SOLN   NDC: A3590391, Lot: [id number], Expiration date: 12/31/2020   Route: Intravitreal, Site: Left Eye, Waste: 0.05 mL  Post-op Post injection exam found visual acuity of at least counting fingers. The patient tolerated the procedure well. There were no complications. The patient received written and verbal post procedure care education.                 ASSESSMENT/PLAN:    ICD-10-CM   1. Severe nonproliferative diabetic retinopathy of both eyes with macular edema associated with type 2 diabetes mellitus (HCC)  O67.1245 Intravitreal Injection, Pharmacologic Agent - OD - Right Eye    Intravitreal Injection, Pharmacologic Agent - OS - Left Eye    aflibercept (EYLEA) SOLN 2 mg    aflibercept (EYLEA) SOLN 2 mg  2.  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 Essential hypertension  I10   5. Hypertensive retinopathy of both eyes  H35.033   6. Pseudophakia of both eyes  Z96.1   7. Ocular hypertension of right eye  H40.051     1. Severe nonproliferative diabetic retinopathy with DME, OU (OS>OD)  - s/p IVA OD #1 (06.26.20), #2 (08.14.20), #3 (09.17.20), #4 (10.16.20), #5 (02.02.22)  - s/p IVA OS #1 (07.17.20), #2 (08.14.20), #3 (09.17.20)  - s/p IVE OS #1 (10.16.20) - sample, #2 (11.13.20), #3 (12.11.20), #4 (01.18.21), #5 (02.19.21), #6 (03.19.21), #7 (04.16.21), #8 (05.14.21), #9 (06.17.21), #10 (7.16.21), #11 (09.21.21), #12 (10.29.21), #13 (11.29.21), #14 (12.29.21), #15 (02.02.22, sample), #16 (03.09.22)  - s/p IVE OD #1 (11.13.20), #2 (12.11.20), #3 (01.18.21), #4 (02.19.21), #5 (03.19.21), #6 (04.16.21), #7 (05.14.21), #8 (06.17.21), #9 (07.16.21), #10 (09.21.21), #11 (10.29.21), #12 (11.29.21), #13 (12.29.21), #14 (03.09.22)  - FA (06.26.20) shows Severe NPDR OU w/ Late leaking MA OU, Enlarged FAZ OU, No NV OU, No significant hyperfluorescence of disc  - BCVA: OD stable at 20/25; OS stable at 20/50  - OCT shows OD: Interval improvement in IRF                                    OS: Persistent IRF -- persistent IRHM greatest temporal macula, Sub-RPE hyper-reflective mass consistent with choroidal nevus--superior to disc, caught on widefield -- stable from prior  - recommend IVE OD #15 and IVE OS #17 today, 04.06.22 --  full Eylea authorization approved for 2022  - pt wishes to proceed  - RBA of procedure discussed, questions answered  - informed consent obtained  - Eylea informed consent form signed and scanned on 01.18.2021  - see procedure note  - [email or web address] Benefits Investigation initiated 10.16.2020 -- approved for 2022  - f/u in 4-5 weeks -- DFE/OCT/possible injection OU  2. Retinal Edema OU  - based on FA, suspect majority of macular edema due  to DM as above, but there may be some edema secondary to post-op CME  - cont PF qid OU and ketorolac qid OU   - history of difficulty with compliance at work  - monitor  - f/u in 5 weeks  3. Choroidal Nevus OS  - located at 1200, mild elevation, +drusen, no SRF  - baseline optos pictures obtained, 06.26.20  - discussed possible referral to [facility] at [facility] for [facility]  4,5.Hypertensive retinopathy OU  - discussed importance of tight BP control  - monitor  6. Pseudophakia OU  - s/p CE/IOL OU (OD on 06.03.20 and OS 06.10.20 by Dr. Gershon Crane)  - beautiful surgeries w/ IOLs in excellent position  - macular edema limiting vision as above  - monitor  7. Ocular hypertension OD  - IOP 12 OD  - cont Cosopt BID OD  - monitor   Ophthalmic Meds Ordered this visit:  Meds ordered this encounter  Medications  . aflibercept (EYLEA) SOLN 2 mg  . aflibercept (EYLEA) SOLN 2 mg      Return for f/u 4-5 weeks, NPDR OU, DFE, OCT.  There are no Patient Instructions on file for this visit.  This document serves as a record of services personally performed by Gardiner Sleeper, MD, PhD. It was created on their behalf by Roselee Nova, COMT. The creation of this record is the provider's dictation and/or activities during the visit.  Electronically signed by: Roselee Nova, COMT 05/07/20 5:25 PM    Gardiner Sleeper, M.D., Ph.D. Diseases & Surgery of the Retina and Vitreous [facility]  I have reviewed the above documentation for accuracy and completeness, and I agree with the above. Gardiner Sleeper, M.D., Ph.D. 05/07/20 5:25 PM   Abbreviations: M myopia (nearsighted); A astigmatism; H hyperopia (farsighted); P presbyopia; Mrx spectacle prescription;  CTL contact lenses; OD right eye; OS left eye; OU both eyes  XT exotropia; ET esotropia; PEK punctate epithelial keratitis; PEE punctate epithelial erosions; DES dry eye syndrome; MGD meibomian gland dysfunction; ATs  artificial tears; PFAT's preservative free artificial tears; Advance nuclear sclerotic cataract; PSC posterior subcapsular cataract; ERM epi-retinal membrane; PVD posterior vitreous detachment; RD retinal detachment; DM diabetes mellitus; DR diabetic retinopathy; NPDR non-proliferative diabetic retinopathy; PDR proliferative diabetic retinopathy; CSME clinically significant macular edema; DME diabetic macular edema; dbh dot blot hemorrhages; CWS cotton wool spot; POAG primary open angle glaucoma; C/D cup-to-disc ratio; HVF humphrey visual field; GVF goldmann visual field; OCT optical coherence tomography; IOP intraocular pressure; BRVO Branch retinal vein occlusion; CRVO central retinal vein occlusion; CRAO central retinal artery occlusion; BRAO branch retinal artery occlusion; RT retinal tear; SB scleral buckle; PPV pars plana vitrectomy; VH Vitreous hemorrhage; PRP panretinal laser photocoagulation; IVK intravitreal kenalog; VMT vitreomacular traction; MH Macular hole;  NVD neovascularization of the disc; NVE neovascularization elsewhere; AREDS age related eye disease study; ARMD age related macular degeneration; POAG primary open angle glaucoma; EBMD epithelial/anterior basement membrane dystrophy; ACIOL anterior chamber intraocular lens; IOL intraocular lens; PCIOL posterior chamber intraocular lens;  Phaco/IOL phacoemulsification with intraocular lens placement; Spur photorefractive keratectomy; LASIK laser assisted in situ keratomileusis; HTN hypertension; DM diabetes mellitus; COPD chronic obstructive pulmonary disease

## 2020-06-01 IMAGING — CT CT CHEST W/O CM
2 of 4 series · 12 of 36 positions shown, 15 images · non-contrast
Comparison: 03/29/2015

CLINICAL DATA: Left upper lobe pulmonary nodules, cough

EXAM:
CT CHEST WITHOUT CONTRAST
TECHNIQUE: Multidetector CT imaging of the chest was performed following the
standard protocol without IV contrast.

[Series 2: chest 2.00 br40 s3 · axial · 0.70mm/px · z∈[+1647,+1919]mm · 9 of 162 slices shown, 12 images (1 of 2)]
[im 13/162  mediastinal]
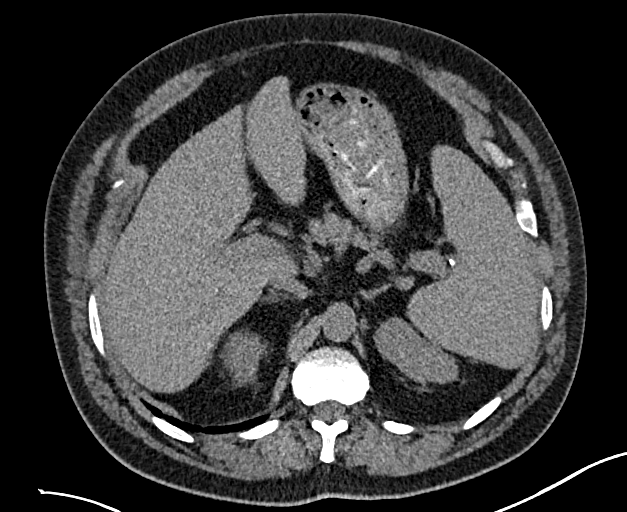
[im 13/162  lung]
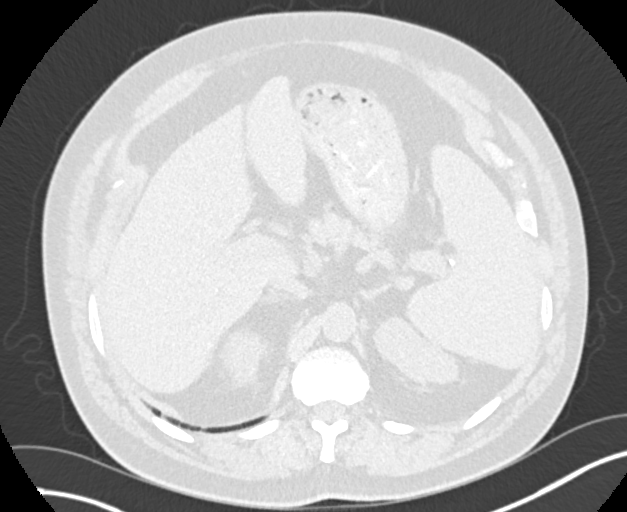
[im 38/162  lung]
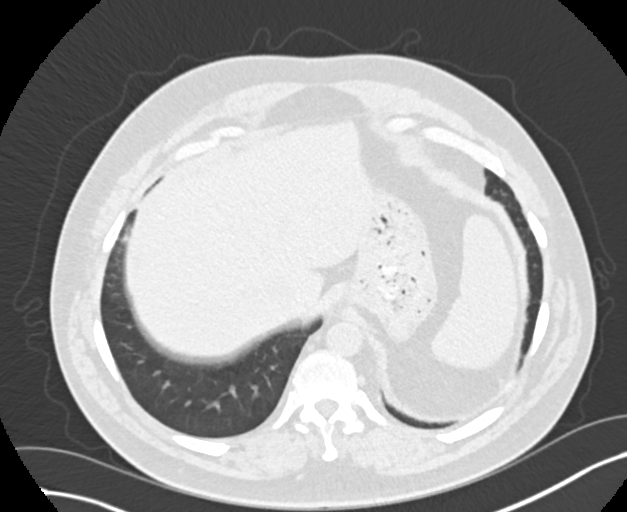
[im 50/162  lung]
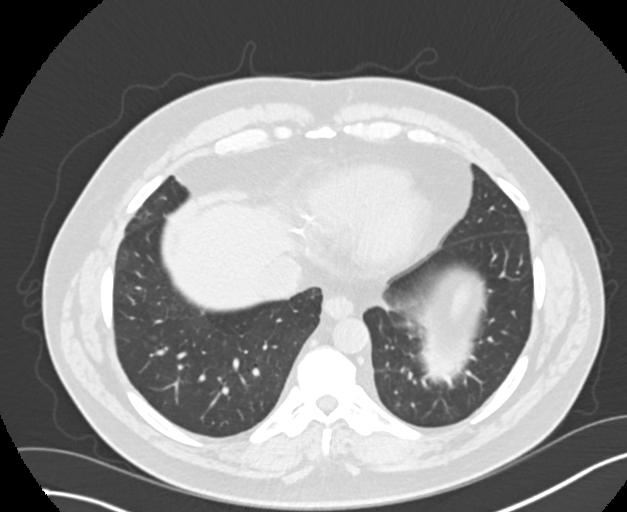
[im 62/162  lung]
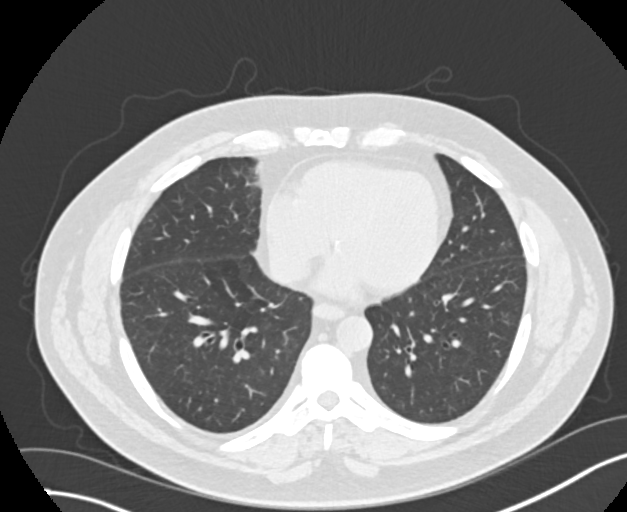
[im 87/162  mediastinal]
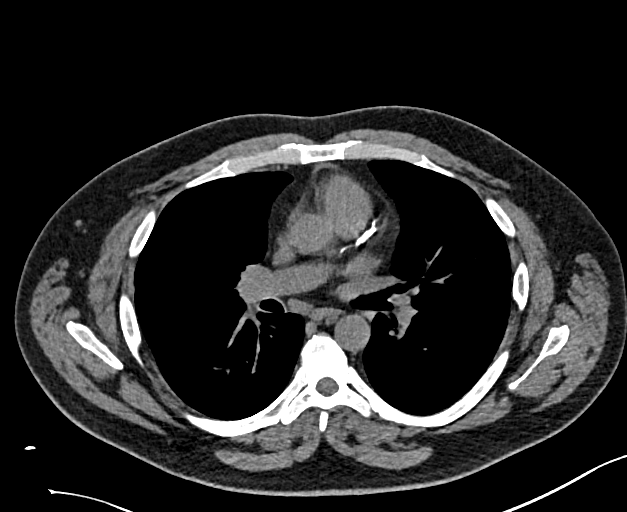
[im 87/162  lung]
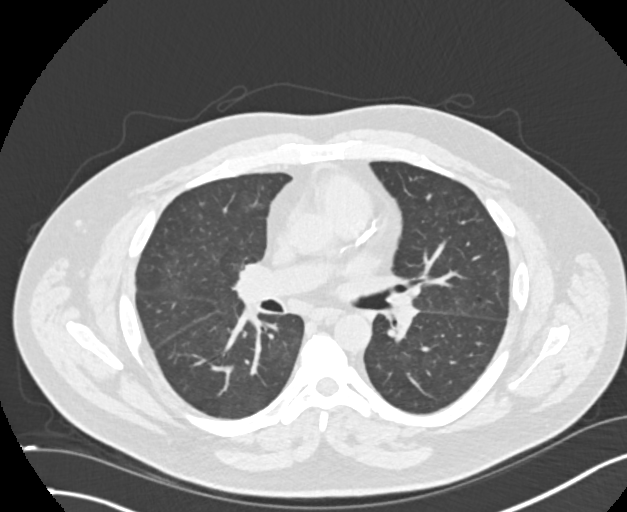
[im 100/162  lung]
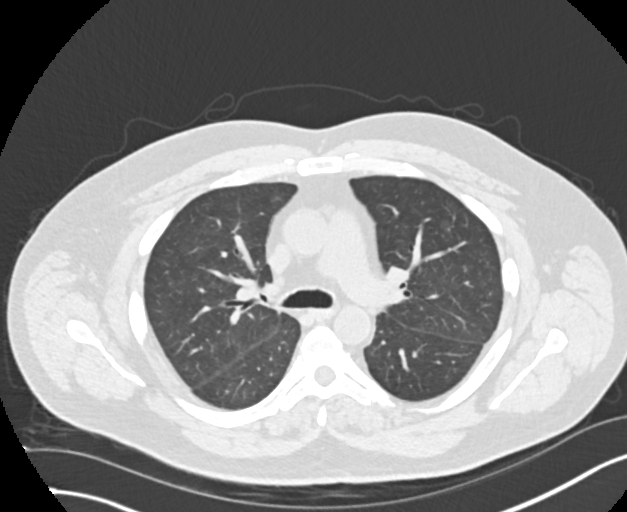
[im 112/162  lung]
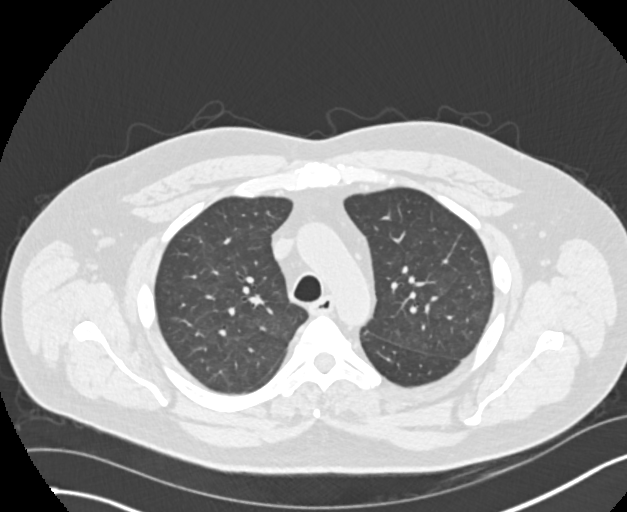
[im 137/162  lung]
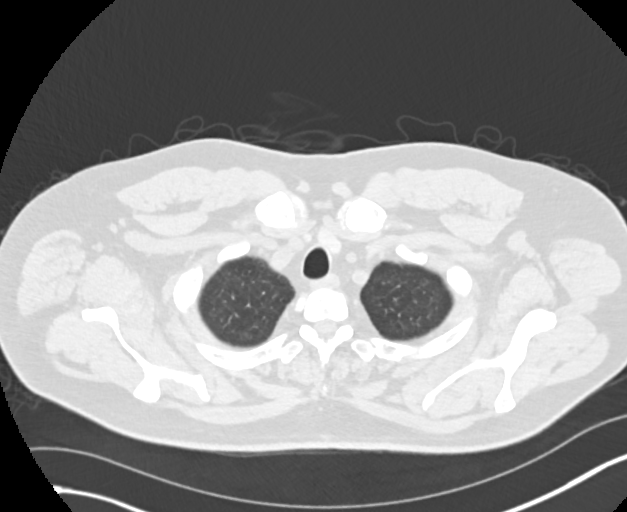
[im 149/162  mediastinal]
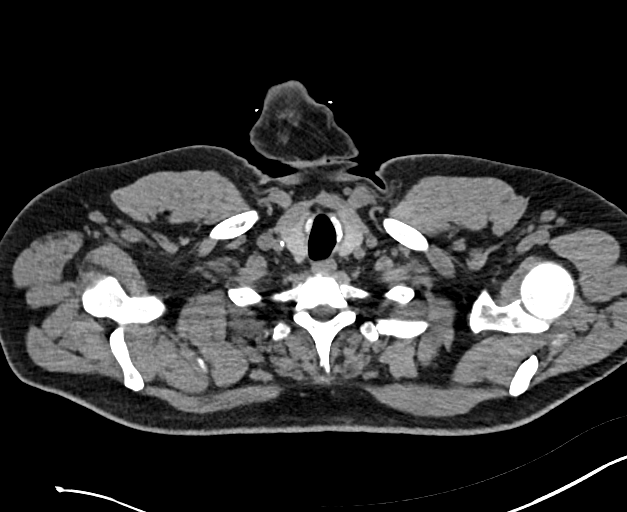
[im 149/162  lung]
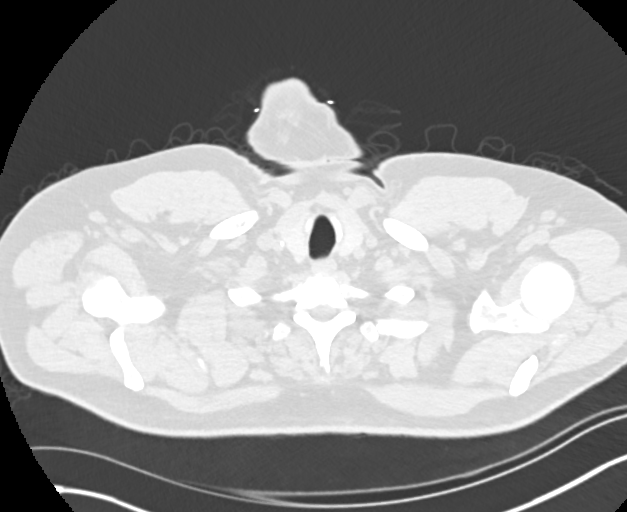

[Series 4: chest 2.00 br40 s3 · coronal · 0.63mm/px · 3 of 178 slices shown (2 of 2)]
[im 36/178  lung]
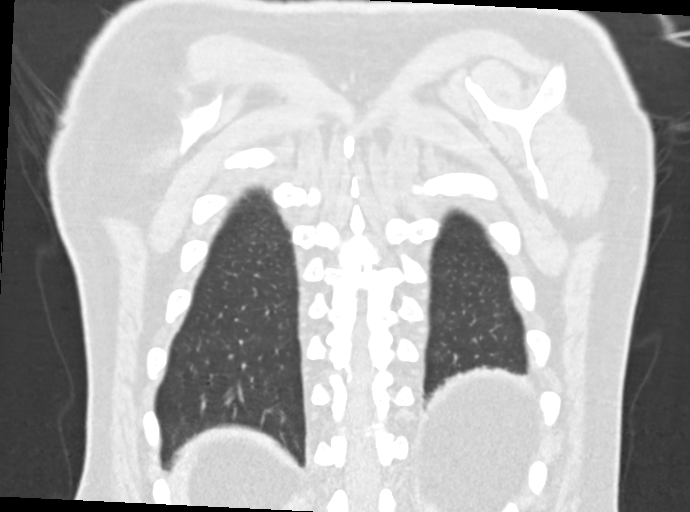
[im 71/178  lung]
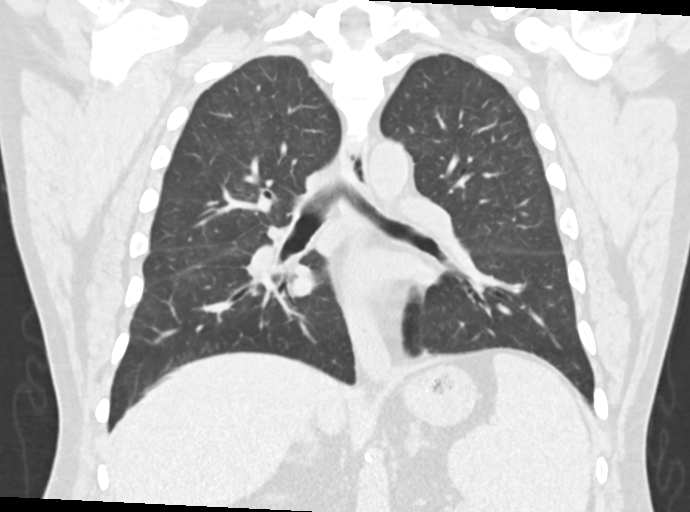
[im 107/178  lung]
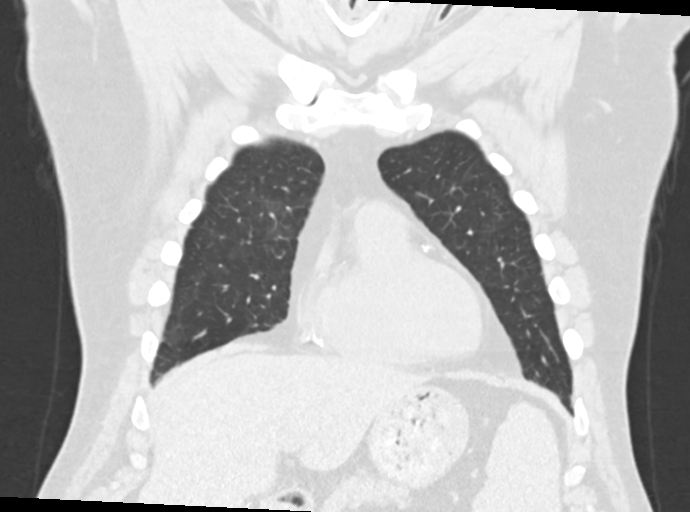

[12 of 36 positions shown; findings below may reference images not displayed]

FINDINGS: Cardiovascular: There is severe atherosclerosis of the coronary
vasculature unchanged. Minimal atherosclerosis of the aortic arch.
No pericardial effusion. The heart is not enlarged.

Mediastinum/Nodes: No enlarged mediastinal or axillary lymph nodes.
Thyroid gland, trachea, and esophagus demonstrate no significant
findings.

Lungs/Pleura: No airspace disease, effusion, or pneumothorax. The
ground-glass nodularity within the left upper and right lower lobes
on prior study has resolved in the interim. There are scattered
areas of mosaic attenuation at the lung apices, likely representing
air trapping and mild emphysema. Central airways are widely patent.

Upper Abdomen: No acute abnormality.

Musculoskeletal: No acute displaced fracture. Reconstructed images
demonstrate no additional findings.
IMPRESSION: 1.  Emphysema ([id number].M).
2. Resolution of the ground-glass nodularity seen previously. No
pulmonary nodules on today's study. No acute intrathoracic process.
3. Aortic Atherosclerosis (WM0U9-IT9.9). Severe coronary artery
atherosclerosis.

## 2020-06-10 NOTE — Progress Notes (Signed)
[facility] Note  06/11/2020     CHIEF COMPLAINT Patient presents for Retina Follow Up   HISTORY OF PRESENT ILLNESS: Juan [name] is a 60 y.o. male who presents to the clinic today for:   HPI    Retina Follow Up    Patient presents with  Diabetic Retinopathy.  In both eyes.  Duration of 5 weeks.  I, the attending physician,  performed the HPI with the patient and updated documentation appropriately.          Comments    5 week follow up NPDR OU- Vision was doing good.  The past 1-2 weeks he is having double vision "again." Patient states this happens every month.  He does not wear glasses. Taking Cosopt BID OU, Prednisolone and Ketorolac only once a day OU. BS 108 A1C 7.4       Last edited by Bernarda Caffey, MD on 06/11/2020  4:09 PM. (History)    pt states he is having some double vision, he states this happens at the end of every month and especially when driving  Referring physician: Harlan Stains, MD [place],  [place] [id number]  HISTORICAL INFORMATION:   Selected notes from the MEDICAL RECORD NUMBER Referred by Dr. Rutherford Guys for concern of CME s/p cataract sx   CURRENT MEDICATIONS: Current Outpatient Medications (Ophthalmic Drugs)  Medication Sig  . dorzolamide-timolol (COSOPT) 22.3-6.8 MG/ML ophthalmic solution Place 1 drop into the right eye 2 (two) times daily.  Marland Kitchen ketorolac (ACULAR) 0.5 % ophthalmic solution Place 1 drop into both eyes 4 (four) times daily.  . prednisoLONE acetate (PRED FORTE) 1 % ophthalmic suspension Place 1 drop into both eyes 4 (four) times daily.   No current facility-administered medications for this visit. (Ophthalmic Drugs)   Current Outpatient Medications (Other)  Medication Sig  . ADVAIR DISKUS 500-50 MCG/DOSE AEPB Inhale 1 puff into the lungs 2 (two) times daily.  Marland Kitchen albuterol (VENTOLIN HFA) 108 (90 Base) MCG/ACT inhaler Inhale 1-2 puffs into the lungs every 6 (six) hours as  needed for wheezing or shortness of breath.  . alfuzosin (UROXATRAL) 10 MG 24 hr tablet TAKE 1 TABLET BY MOUTH TWICE A DAY  . aspirin EC 81 MG tablet Take 81 mg by mouth daily.  Marland Kitchen atorvastatin (LIPITOR) 40 MG tablet Take 40 mg by mouth daily.  . carvedilol (COREG) 6.25 MG tablet TAKE 1 TABLET BY MOUTH TWICE A DAY WITH A MEAL  . Empagliflozin-metFORMIN HCl (SYNJARDY) 12.06-998 MG TABS Take 1 tablet by mouth 2 (two) times a day.   . esomeprazole (NEXIUM) 40 MG capsule Take 40 mg by mouth daily at 12 noon.  . insulin glargine, 2 Unit Dial, (TOUJEO MAX SOLOSTAR) 300 UNIT/ML Solostar Pen Inject 40 Units into the skin daily.   . iron polysaccharides (NIFEREX) 150 MG capsule Take 150 mg by mouth daily.  Marland Kitchen lisinopril (ZESTRIL) 40 MG tablet Take 40 mg by mouth daily.  . pantoprazole (PROTONIX) 40 MG tablet Take 40 mg by mouth daily.  . Semaglutide,0.25 or 0.5MG /DOS, (OZEMPIC, 0.25 OR 0.5 MG/DOSE,) 2 MG/1.5ML SOPN Inject into the skin.  Marland Kitchen SPIRIVA RESPIMAT 2.5 MCG/ACT AERS INHALE 2 PUFFS BY MOUTH INTO THE LUNGS DAILY  . predniSONE (STERAPRED UNI-PAK 21 TAB) 10 MG (21) TBPK tablet Take by mouth daily. Take as directed (Patient not taking: Reported on 06/11/2020)   No current facility-administered medications for this visit. (Other)      REVIEW OF  SYSTEMS: ROS    Positive for: Gastrointestinal, Musculoskeletal, Endocrine, Eyes, Respiratory   Negative for: Constitutional, Neurological, Skin, Genitourinary, HENT, Cardiovascular, Psychiatric, Allergic/Imm, Heme/Lymph   Last edited by Leonie [name], COA on 06/11/2020  3:06 PM. (History)       ALLERGIES Allergies  Allergen Reactions  . Naproxen Shortness Of Breath  . Naproxen Sodium Shortness Of Breath  . Penicillins Rash    Has patient had a PCN reaction causing immediate rash, facial/tongue/throat swelling, SOB or lightheadedness with hypotension: Yes Has patient had a PCN reaction causing severe rash involving mucus membranes or skin necrosis:  No Has patient had a PCN reaction that required hospitalization No Has patient had a PCN reaction occurring within the last 10 years: No If all of the above answers are "NO", then may proceed with Cephalosporin use.  Has patient had a PCN reaction causing immediate rash, facial/tongue/throat swelling, SOB or lightheadedness with hypotension: Yes Has patient had a PCN reaction causing severe rash involving mucus membranes or skin necrosis: No Has patient had a PCN reaction that required hospitalization No Has patient had a PCN reaction occurring within the last 10 years: No If all of the above answers are "NO", then may proceed with Cephalosporin use.     PAST MEDICAL HISTORY Past Medical History:  Diagnosis Date  . Arthritis   . Asthma   . COPD (chronic obstructive pulmonary disease) ([place])   . Diabetes mellitus    Type 2  . Diabetic retinopathy ([place])    NPDR OU  . GERD (gastroesophageal reflux disease)   . H/O hiatal hernia   . History of kidney stones   . Hyperlipemia   . Hypertension   . Hypertensive retinopathy    OU  . Nasal polyps   . Pneumonia 2014  . Shortness of breath   . Sleep apnea    pt has CPAP but is unable to use it d/t having polyps in his nose. Pt stated "I need to call and get a CPAP mask instead"  . Stomach ulcer    Past Surgical History:  Procedure Laterality Date  . CATARACT EXTRACTION Right 07/05/2018   Dr. Gershon Crane  . CATARACT EXTRACTION Left 07/12/2018   Dr. Gershon Crane  . CHOLECYSTECTOMY N/A 01/23/2015   Procedure: LAPAROSCOPIC CHOLECYSTECTOMY ;  Surgeon: Georganna Skeans, MD;  Location: [place];  Service: General;  Laterality: N/A;  . COLONOSCOPY W/ POLYPECTOMY    . ESOPHAGOGASTRODUODENOSCOPY    . EYE SURGERY    . KNEE ARTHROSCOPY  03/24/2011   Procedure: ARTHROSCOPY KNEE;  Surgeon: Alta Corning, MD;  Location: [place];  Service: Orthopedics;  Laterality: Right;  partial medial menisectomy, chondroplaty patella, and medial medial  plica  . LEFT HEART CATH AND CORONARY ANGIOGRAPHY N/A 06/29/2019   Procedure: LEFT HEART CATH AND CORONARY ANGIOGRAPHY;  Surgeon: Wellington Hampshire, MD;  Location: [place] [facility];  Service: Cardiovascular;  Laterality: N/A;    FAMILY HISTORY Family History  Problem Relation Age of Onset  . Diabetes Father     SOCIAL HISTORY Social History   Tobacco Use  . Smoking status: Former Smoker    Packs/day: 3.00    Years: 20.00    Pack years: 60.00    Types: Cigarettes  . Smokeless tobacco: Current User    Types: Chew  . Tobacco comment: 1 can/daily  Substance Use Topics  . Alcohol use: Yes    Comment: social, 1x weekly  . Drug use: No  OPHTHALMIC EXAM:  Base Eye Exam    Visual Acuity (Snellen - Linear)      Right Left   Dist [place] 20/30 -2 20/60   Dist ph [place] 20/20 -2 20/40       Tonometry (Tonopen, 3:14 PM)      Right Left   Pressure 18 19       Pupils      Dark Light Shape React APD   Right 3 2 Round Brisk None   Left 3 2 Round Brisk None       Visual Fields (Counting fingers)      Left Right     Full       Extraocular Movement      Right Left    Full Full       Neuro/Psych    Oriented x3: Yes   Mood/Affect: Normal       Dilation    Both eyes: 1.0% Mydriacyl, 2.5% Phenylephrine @ 3:14 PM        Slit Lamp and Fundus Exam    Slit Lamp Exam      Right Left   Lids/Lashes Dermatochalasis - upper lid, Telangiectasia, mild Meibomian gland dysfunction Dermatochalasis - upper lid, Telangiectasia, mild Meibomian gland dysfunction   Conjunctiva/Sclera White and quiet, +concretions on palpebral conj Mild nasal Pinguecula, +concretions on palpebral conj   Cornea Mild Arcus, Well healed temporal cataract wounds Arcus, 1+ Descemet's folds, Well healed temporal cataract wounds   Anterior Chamber deep, 1+ cell/pigment deep, 1+ cell/pigment   Iris Round and dilated, mild patch of atrophy at 0800, no NVI Round and dilated, No NVI   Lens PC IOL in good  position PC IOL in good position   Vitreous Vitreous syneresis Vitreous syneresis       Fundus Exam      Right Left   Disc Pink and Sharp Pallor, Sharp rim   C/D Ratio 0.3 0.3   Macula Blunted foveal reflex, scattered IRH/DBH/exudates -- greatest temporal macula - improved, trace residual cystic changes remain Blunted foveal reflex, +central edema -- persistent, scattered DBH and MAs, scattered punctate exudates - greatest superior and temporal macula -- persistent   Vessels Vascular attenuation, Tortuous Vascular attenuation, Tortuous   Periphery Attached, scattered MA and exudates greatest posteriorly Attached, pigmented choroidal nevus superiorly with mild elevation, +drusen, no SRF or orage pigment -- unchanged from prior          IMAGING AND PROCEDURES  Imaging and Procedures for @TODAY @  OCT, Retina - OU - Both Eyes       Right Eye Quality was good. Central Foveal Thickness: 266. Progression has been stable. Findings include abnormal foveal contour, intraretinal hyper-reflective material, intraretinal fluid, outer retinal atrophy, no SRF (Mild Interval increase in central cystic changes).   Left Eye Quality was good. Central Foveal Thickness: 252. Progression has been stable. Findings include intraretinal fluid, intraretinal hyper-reflective material, abnormal foveal contour, no SRF, outer retinal atrophy (Persistent IRF/IRHM greatest temporal macula, Sub-RPE hyper-reflective mass consistent with choroidal nevus--superior to disc, caught on widefield -- stable from prior).   Notes *Images captured and stored on drive  Diagnosis / Impression: Severe DME OU OD: Mild Interval increase in central cystic changes OS: Persistent IRF/IRHM greatest temporal macula, Sub-RPE hyper-reflective mass consistent with choroidal nevus--superior to disc, caught on widefield -- stable from prior  Clinical management:  See below  Abbreviations: NFP - Normal foveal profile. CME - cystoid  macular edema. PED - pigment epithelial detachment. IRF - intraretinal  fluid. SRF - subretinal fluid. EZ - ellipsoid zone. ERM - epiretinal membrane. ORA - outer retinal atrophy. ORT - outer retinal tubulation. SRHM - subretinal hyper-reflective material        Intravitreal Injection, Pharmacologic Agent - OD - Right Eye       Time Out 06/11/2020. 3:39 PM. Confirmed correct patient, procedure, site, and patient consented.   Anesthesia Topical anesthesia was used. Anesthetic medications included Lidocaine 2%, Proparacaine 0.5%.   Procedure Preparation included 5% betadine to ocular surface, eyelid speculum. A (32g) needle was used.   Injection:  2 mg aflibercept Alfonse Flavors) SOLN   NDC: A3590391, Lot: [id number], Expiration date: 01/30/2021   Route: Intravitreal, Site: Right Eye, Waste: 0.05 mL  Post-op Post injection exam found visual acuity of at least counting fingers. The patient tolerated the procedure well. There were no complications. The patient received written and verbal post procedure care education.        Intravitreal Injection, Pharmacologic Agent - OS - Left Eye       Time Out 06/11/2020. 4:10 PM. Confirmed correct patient, procedure, site, and patient consented.   Anesthesia Topical anesthesia was used. Anesthetic medications included Lidocaine 2%, Proparacaine 0.5%.   Procedure Preparation included 5% betadine to ocular surface, eyelid speculum. A supplied (32g) needle was used.   Injection:  2 mg aflibercept Alfonse Flavors) SOLN   NDC: M7179715, Lot: [id number], Expiration date: 01/30/2021   Route: Intravitreal, Site: Left Eye, Waste: 0.05 mL  Post-op Post injection exam found visual acuity of at least counting fingers. The patient tolerated the procedure well. There were no complications. The patient received written and verbal post procedure care education.                 ASSESSMENT/PLAN:    ICD-10-CM   1. Severe nonproliferative diabetic  retinopathy of both eyes with macular edema associated with type 2 diabetes mellitus (HCC)  J69.6789 Intravitreal Injection, Pharmacologic Agent - OD - Right Eye    Intravitreal Injection, Pharmacologic Agent - OS - Left Eye    aflibercept (EYLEA) SOLN 2 mg    aflibercept (EYLEA) SOLN 2 mg  2. 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 Essential hypertension  I10   5. Hypertensive retinopathy of both eyes  H35.033   6. Pseudophakia of both eyes  Z96.1   7. Ocular hypertension of right eye  H40.051     1. Severe nonproliferative diabetic retinopathy with DME, OU (OS>OD)  - s/p IVA OD #1 (06.26.20), #2 (08.14.20), #3 (09.17.20), #4 (10.16.20), #5 (02.02.22)  - s/p IVA OS #1 (07.17.20), #2 (08.14.20), #3 (09.17.20)  - s/p IVE OS #1 (10.16.20) - sample, #2 (11.13.20), #3 (12.11.20), #4 (01.18.21), #5 (02.19.21), #6 (03.19.21), #7 (04.16.21), #8 (05.14.21), #9 (06.17.21), #10 (7.16.21), #11 (09.21.21), #12 (10.29.21), #13 (11.29.21), #14 (12.29.21), #15 (02.02.22, sample), #16 (03.09.22), #17 (04.06.22)  - s/p IVE OD #1 (11.13.20), #2 (12.11.20), #3 (01.18.21), #4 (02.19.21), #5 (03.19.21), #6 (04.16.21), #7 (05.14.21), #8 (06.17.21), #9 (07.16.21), #10 (09.21.21), #11 (10.29.21), #12 (11.29.21), #13 (12.29.21), #14 (03.09.22), #15 (04.06.22)  - FA (06.26.20) shows Severe NPDR OU w/ Late leaking MA OU, Enlarged FAZ OU, No NV OU, No significant hyperfluorescence of disc  - BCVA improved OU: OD 20/20 from 20/25; OS 20/40 from 20/50  - OCT shows OD: mild interval inc in central cystic changes  OS: Persistent IRF -- persistent IRHM greatest temporal macula, Sub-RPE hyper-reflective mass consistent with choroidal nevus--superior to disc, caught on widefield -- stable from prior  - recommend IVE OD #16 and IVE OS #18 today, 05.11.22 -- full Eylea authorization approved for 2022  - pt wishes to proceed  - RBA of  procedure discussed, questions answered  - informed consent obtained  - Eylea informed consent form signed and scanned on 01.18.2021  - see procedure note  - [email or web address] Benefits Investigation initiated 10.16.2020 -- approved for 2022  - f/u in 5 weeks -- DFE/OCT/possible injection OU  2. Retinal Edema OU  - based on FA, suspect majority of macular edema due to DM as above, but there may be some edema secondary to post-op CME  - cont PF qid OU and ketorolac qid OU   - history of difficulty with compliance at work  - monitor  - f/u in 5 weeks  3. Choroidal Nevus OS  - located at 1200, mild elevation, +drusen, no SRF  - baseline optos pictures obtained, 06.26.20  - discussed possible referral to oncology at [facility] in [place]  4,5.Hypertensive retinopathy OU  - discussed importance of tight BP control  - monitor  6. Pseudophakia OU  - s/p CE/IOL OU (OD on 06.03.20 and OS 06.10.20 by Dr. Gershon Crane)  - beautiful surgeries w/ IOLs in excellent position  - macular edema limiting vision as above  - monitor  7. Ocular hypertension OD  - IOP 18 OD  - cont Cosopt BID OD  - monitor   Ophthalmic Meds Ordered this visit:  Meds ordered this encounter  Medications  . aflibercept (EYLEA) SOLN 2 mg  . aflibercept (EYLEA) SOLN 2 mg      Return in about 5 weeks (around 07/16/2020) for f/u NPDR OU, DFE, OCT.  There are no Patient Instructions on file for this visit.  This document serves as a record of services personally performed by Gardiner Sleeper, MD, PhD. It was created on their behalf by Roselee Nova, COMT. The creation of this record is the provider's dictation and/or activities during the visit.  Electronically signed by: Roselee Nova, COMT 06/11/20 4:13 PM   This document serves as a record of services personally performed by Gardiner Sleeper, MD, PhD. It was created on their behalf by San Jetty. Owens Shark, OA an ophthalmic technician. The creation of this record is the provider's  dictation and/or activities during the visit.    Electronically signed by: San Jetty. Owens Shark, [place] 05.11.2022 4:13 PM  Gardiner Sleeper, M.D., Ph.D. Diseases & Surgery of the Retina and Vitreous [facility]  I have reviewed the above documentation for accuracy and completeness, and I agree with the above. Gardiner Sleeper, M.D., Ph.D. 06/11/20 4:13 PM  Abbreviations: M myopia (nearsighted); A astigmatism; H hyperopia (farsighted); P presbyopia; Mrx spectacle prescription;  CTL contact lenses; OD right eye; OS left eye; OU both eyes  XT exotropia; ET esotropia; PEK punctate epithelial keratitis; PEE punctate epithelial erosions; DES dry eye syndrome; MGD meibomian gland dysfunction; ATs artificial tears; PFAT's preservative free artificial tears; Tarnov nuclear sclerotic cataract; PSC posterior subcapsular cataract; ERM epi-retinal membrane; PVD posterior vitreous detachment; RD retinal detachment; DM diabetes mellitus; DR diabetic retinopathy; NPDR non-proliferative diabetic retinopathy; PDR proliferative diabetic retinopathy; CSME clinically significant macular edema; DME diabetic macular edema; dbh dot blot hemorrhages; CWS cotton wool spot; POAG primary open angle glaucoma; C/D cup-to-disc ratio; HVF humphrey visual field; GVF goldmann  visual field; OCT optical coherence tomography; IOP intraocular pressure; BRVO Branch retinal vein occlusion; CRVO central retinal vein occlusion; CRAO central retinal artery occlusion; BRAO branch retinal artery occlusion; RT retinal tear; SB scleral buckle; PPV pars plana vitrectomy; VH Vitreous hemorrhage; PRP panretinal laser photocoagulation; IVK intravitreal kenalog; VMT vitreomacular traction; MH Macular hole;  NVD neovascularization of the disc; NVE neovascularization elsewhere; AREDS age related eye disease study; ARMD age related macular degeneration; POAG primary open angle glaucoma; EBMD epithelial/anterior basement membrane dystrophy; ACIOL  anterior chamber intraocular lens; IOL intraocular lens; PCIOL posterior chamber intraocular lens; Phaco/IOL phacoemulsification with intraocular lens placement; [place] photorefractive keratectomy; LASIK laser assisted in situ keratomileusis; HTN hypertension; DM diabetes mellitus; COPD chronic obstructive pulmonary disease

## 2020-06-11 ENCOUNTER — Ambulatory Visit (INDEPENDENT_AMBULATORY_CARE_PROVIDER_SITE_OTHER): Payer: BC Managed Care – PPO | Admitting: Ophthalmology

## 2020-06-11 ENCOUNTER — Encounter (INDEPENDENT_AMBULATORY_CARE_PROVIDER_SITE_OTHER): Payer: Self-pay | Admitting: Ophthalmology

## 2020-06-11 ENCOUNTER — Other Ambulatory Visit: Payer: Self-pay

## 2020-06-11 DIAGNOSIS — E113413 Type 2 diabetes mellitus with severe nonproliferative diabetic retinopathy with macular edema, bilateral: Secondary | ICD-10-CM

## 2020-06-11 DIAGNOSIS — H40051 Ocular hypertension, right eye: Secondary | ICD-10-CM

## 2020-06-11 DIAGNOSIS — I10 Essential (primary) hypertension: Secondary | ICD-10-CM | POA: Diagnosis not present

## 2020-06-11 DIAGNOSIS — D3132 Benign neoplasm of left choroid: Secondary | ICD-10-CM

## 2020-06-11 DIAGNOSIS — H3581 Retinal edema: Secondary | ICD-10-CM

## 2020-06-11 DIAGNOSIS — Z961 Presence of intraocular lens: Secondary | ICD-10-CM

## 2020-06-11 DIAGNOSIS — H35033 Hypertensive retinopathy, bilateral: Secondary | ICD-10-CM

## 2020-06-11 MED ORDER — AFLIBERCEPT 2MG/0.05ML IZ SOLN FOR KALEIDOSCOPE
2.0000 mg | INTRAVITREAL | Status: AC | PRN
  Administered 2020-06-11: 2 mg via INTRAVITREAL

## 2020-06-26 DIAGNOSIS — J0100 Acute maxillary sinusitis, unspecified: Secondary | ICD-10-CM | POA: Diagnosis not present

## 2020-07-15 ENCOUNTER — Ambulatory Visit
Admission: EM | Admit: 2020-07-15 | Discharge: 2020-07-15 | Disposition: A | Discharge status: Home / Self Care | Payer: BC Managed Care – PPO

## 2020-07-15 ENCOUNTER — Ambulatory Visit (INDEPENDENT_AMBULATORY_CARE_PROVIDER_SITE_OTHER): Payer: BC Managed Care – PPO

## 2020-07-15 ENCOUNTER — Other Ambulatory Visit: Payer: Self-pay

## 2020-07-15 ENCOUNTER — Other Ambulatory Visit: Payer: Self-pay | Admitting: Urology

## 2020-07-15 DIAGNOSIS — R0981 Nasal congestion: Secondary | ICD-10-CM

## 2020-07-15 DIAGNOSIS — R059 Cough, unspecified: Secondary | ICD-10-CM | POA: Diagnosis not present

## 2020-07-15 DIAGNOSIS — J449 Chronic obstructive pulmonary disease, unspecified: Secondary | ICD-10-CM | POA: Diagnosis not present

## 2020-07-15 DIAGNOSIS — H9203 Otalgia, bilateral: Secondary | ICD-10-CM

## 2020-07-15 DIAGNOSIS — R0602 Shortness of breath: Secondary | ICD-10-CM

## 2020-07-15 DIAGNOSIS — E113413 Type 2 diabetes mellitus with severe nonproliferative diabetic retinopathy with macular edema, bilateral: Secondary | ICD-10-CM | POA: Diagnosis not present

## 2020-07-15 DIAGNOSIS — J4540 Moderate persistent asthma, uncomplicated: Secondary | ICD-10-CM | POA: Diagnosis not present

## 2020-07-15 DIAGNOSIS — J209 Acute bronchitis, unspecified: Secondary | ICD-10-CM | POA: Diagnosis not present

## 2020-07-15 MED ORDER — DOXYCYCLINE HYCLATE 100 MG PO CAPS: 100.0000 mg | ORAL_CAPSULE | Freq: Two times a day (BID) | ORAL | 0 refills | Status: DC

## 2020-07-15 MED ORDER — PROMETHAZINE-DM 6.25-15 MG/5ML PO SYRP: 5.0000 mL | ORAL_SOLUTION | Freq: Three times a day (TID) | ORAL | 0 refills | Status: DC | PRN

## 2020-07-15 NOTE — ED Provider Notes (Signed)
[facility]    CSN: [id number] Arrival date & time: 07/15/20  0802      History   Chief Complaint Chief Complaint  Patient presents with   Sore Throat   Nasal Congestion    HPI Juan [name] is a 60 y.o. male.   HPI Patient with a known history of COPD, presents for evaluation of sore throat and congestion, cough. Patient has a known history of COPD, asthma, type 2 diabetes.  He reports others have been sick in his home however declines COVID and flu testing.  Patient also has a history of sleep apnea.  He also has a history of prior pneumonia.  He endorses shortness of breath which below his baseline as of yesterday.  He also endorses some otalgia and nasal congestion.  Past Medical History:  Diagnosis Date   Arthritis    Asthma    COPD (chronic obstructive pulmonary disease) ([place])    Diabetes mellitus    Type 2   Diabetic retinopathy ([place])    NPDR OU   GERD (gastroesophageal reflux disease)    H/O hiatal hernia    History of kidney stones    Hyperlipemia    Hypertension    Hypertensive retinopathy    OU   Nasal polyps    Pneumonia 2014   Shortness of breath    Sleep apnea    pt has CPAP but is unable to use it d/t having polyps in his nose. Pt stated "I need to call and get a CPAP mask instead"   Stomach ulcer     Patient Active Problem List   Diagnosis Date Noted   Dyspnea 09/24/2019   Abnormal stress test    Chest pain 02/28/2013   HTN (hypertension) 02/28/2013   DM2 (diabetes mellitus, type 2) ([place]) 02/28/2013   Dyslipidemia 02/28/2013   Hyposmolality and/or hyponatremia 02/28/2013   Obesity, unspecified 02/28/2013   Community acquired pneumonia 02/28/2013    Past Surgical History:  Procedure Laterality Date   CATARACT EXTRACTION Right 07/05/2018   Dr. Gershon Crane   CATARACT EXTRACTION Left 07/12/2018   Dr. Gershon Crane   CHOLECYSTECTOMY N/A 01/23/2015   Procedure: LAPAROSCOPIC CHOLECYSTECTOMY ;  Surgeon: Georganna Skeans, MD;  Location: [place];  Service: General;  Laterality: N/A;   COLONOSCOPY W/ POLYPECTOMY     ESOPHAGOGASTRODUODENOSCOPY     EYE SURGERY     KNEE ARTHROSCOPY  03/24/2011   Procedure: ARTHROSCOPY KNEE;  Surgeon: Alta Corning, MD;  Location: [place];  Service: Orthopedics;  Laterality: Right;  partial medial menisectomy, chondroplaty patella, and medial medial plica   LEFT HEART CATH AND CORONARY ANGIOGRAPHY N/A 06/29/2019   Procedure: LEFT HEART CATH AND CORONARY ANGIOGRAPHY;  Surgeon: Wellington Hampshire, MD;  Location: [facility];  Service: Cardiovascular;  Laterality: N/A;       Home Medications    Prior to Admission medications   Medication Sig Start Date End Date Taking? Authorizing Provider  Semaglutide (OZEMPIC, 0.25 OR 0.5 MG/DOSE, [place]) Inject into the skin.   Yes [provider]  ADVAIR DISKUS 500-50 MCG/DOSE AEPB Inhale 1 puff into the lungs 2 (two) times daily. 01/09/15   [provider]  albuterol (VENTOLIN HFA) 108 (90 Base) MCG/ACT inhaler Inhale 1-2 puffs into the lungs every 6 (six) hours as needed for wheezing or shortness of breath. 01/03/20   Wieters, Hallie C, PA-C  alfuzosin (UROXATRAL) 10 MG 24 hr tablet TAKE 1 TABLET BY MOUTH TWICE A DAY 04/08/20  McKenzie, Candee Furbish, MD  aspirin EC 81 MG tablet Take 81 mg by mouth daily.    [provider]  atorvastatin (LIPITOR) 40 MG tablet Take 40 mg by mouth daily.    [provider]  carvedilol (COREG) 6.25 MG tablet TAKE 1 TABLET BY MOUTH TWICE A DAY WITH A MEAL 04/14/20   Elouise Munroe, MD  dorzolamide-timolol (COSOPT) 22.3-6.8 MG/ML ophthalmic solution Place 1 drop into the right eye 2 (two) times daily. 03/05/20   Bernarda Caffey, MD  Empagliflozin-metFORMIN HCl [facility]) 12.06-998 MG TABS Take 1 tablet by mouth 2 (two) times a day.  07/06/18   [provider]  esomeprazole (NEXIUM) 40 MG capsule Take 40 mg by mouth daily at 12 noon.    [provider]  insulin glargine, 2  Unit Dial, (TOUJEO MAX SOLOSTAR) 300 UNIT/ML Solostar Pen Inject 40 Units into the skin daily.     [provider]  iron polysaccharides (NIFEREX) 150 MG capsule Take 150 mg by mouth daily. 10/14/19   [provider]  ketorolac (ACULAR) 0.5 % ophthalmic solution Place 1 drop into both eyes 4 (four) times daily. 03/05/20 03/05/21  Bernarda Caffey, MD  lisinopril (ZESTRIL) 40 MG tablet Take 40 mg by mouth daily.    [provider]  pantoprazole (PROTONIX) 40 MG tablet Take 40 mg by mouth daily. 07/03/19   [provider]  prednisoLONE acetate (PRED FORTE) 1 % ophthalmic suspension Place 1 drop into both eyes 4 (four) times daily. 03/05/20   Bernarda Caffey, MD  Semaglutide,0.25 or 0.5MG /DOS, (OZEMPIC, 0.25 OR 0.5 MG/DOSE,) 2 MG/1.5ML SOPN Inject into the skin. 09/10/19   [provider]  [place] 2.5 MCG/ACT AERS INHALE 2 PUFFS BY MOUTH INTO THE LUNGS DAILY 01/30/20   Laurin Coder, MD    Family History Family History  Problem Relation Age of Onset   Diabetes Father     Social History Social History   Tobacco Use   Smoking status: Former    Packs/day: 3.00    Years: 20.00    Pack years: 60.00    Types: Cigarettes   Smokeless tobacco: Current    Types: Chew   Tobacco comments:    1 can/daily  Substance Use Topics   Alcohol use: Yes    Comment: social, 1x weekly   Drug use: No     Allergies   Naproxen, Naproxen sodium, and Penicillins   Review of Systems Review of Systems Pertinent negatives listed in HPI'  Physical Exam Triage Vital Signs ED Triage Vitals [07/15/20 0818]  Enc Vitals Group     BP (!) 160/101     Pulse Rate 92     Resp 18     Temp 98.1 F (36.7 C)     Temp Source Oral     SpO2 94 %     Weight      Height      Head Circumference      Peak Flow      Pain Score 6     Pain Loc      Pain Edu?      Excl. in [place]?    No data found.  Updated Vital Signs BP (!) 160/101 (BP Location: Left Arm)   Pulse 92    Temp 98.1 F (36.7 C) (Oral)   Resp 18   SpO2 94%   Visual Acuity Right Eye Distance:   Left Eye Distance:   Bilateral Distance:    Right Eye Near:  Left Eye Near:    Bilateral Near:     Physical Exam   UC Treatments / Results  Labs (all labs ordered are listed, but only abnormal results are displayed) Labs Reviewed - No data to display  EKG   Radiology No results found.  Procedures Procedures (including critical care time)  Medications Ordered in UC Medications - No data to display  Initial Impression / Assessment and Plan / UC Course  I have reviewed the triage vital signs and the nursing notes.  Pertinent labs & imaging results that were available during my care of the patient were reviewed by me and considered in my medical decision making (see chart for details).    Chest x-ray negative for an acute pneumonia treated today for bronchitis likely secondary to chronic COPD.  Patient also has a secondary upper respiratory illness.  Promethazine DM prescribed for cough and nasal symptoms.  Patient advised to resume use of his chronic inhaler therapy as he is having some restriction with breathing and audible wheezing on exam.  Avoid any steroids as patient is a uncontrolled diabetic.  ER precautions given if a work of breathing worsens or doesn't improve with prescribed therapy Final Clinical Impressions(s) / UC Diagnoses   Final diagnoses:  Acute bronchitis, unspecified organism  Nasal congestion  Otalgia of both ears     Discharge Instructions      Resume use of inhalers to help with constriction noted on exam.  I prescribed you doxycycline for treatment of bronchitis.  Promethazine DM for cough.  If shortness of breath becomes severe go immediately to the emergency department for work-up and evaluation.    ED Prescriptions     Medication Sig Dispense Auth. Provider   doxycycline (VIBRAMYCIN) 100 MG capsule Take 1 capsule (100 mg total) by mouth 2 (two)  times daily. 20 capsule Scot Jun, FNP   promethazine-dextromethorphan (PROMETHAZINE-DM) 6.25-15 MG/5ML syrup Take 5 mLs by mouth 3 (three) times daily as needed for cough. 140 mL Scot Jun, FNP      PDMP not reviewed this encounter.   Scot Jun, FNP 07/15/20 0900

## 2020-07-15 NOTE — ED Triage Notes (Signed)
Pt c/o lt sided sore throat and lt ear pain x1wk. C/o productive cough and congestion x2 days.

## 2020-07-15 NOTE — Progress Notes (Signed)
[facility] Note  07/16/2020     CHIEF COMPLAINT Patient presents for Retina Follow Up   HISTORY OF PRESENT ILLNESS: Juan [name] is a 60 y.o. male who presents to the clinic today for:   HPI     Retina Follow Up   Patient presents with  Diabetic Retinopathy.  In both eyes.  This started weeks ago.  Severity is moderate.  Duration of weeks.  Since onset it is stable.  I, the attending physician,  performed the HPI with the patient and updated documentation appropriately.        Comments   Pt states vision seems about the same.  States BS is high due to being on oral prednisone for bronchitis.  Pt denies eye pain or discomfort.  Pt has more floaters OD than OS.      Last edited by Bernarda Caffey, MD on 07/16/2020  5:09 PM.     pt states he is on po prednisone for a viral bronchitis, he states is has raised his BP and BG (it was 268 this am)  Referring physician: Harlan Stains, MD [place],  [place] [address]  HISTORICAL INFORMATION:   Selected notes from the MEDICAL RECORD NUMBER Referred by Dr. Rutherford Guys for concern of CME s/p cataract sx   CURRENT MEDICATIONS: Current Outpatient Medications (Ophthalmic Drugs)  Medication Sig   dorzolamide-timolol (COSOPT) 22.3-6.8 MG/ML ophthalmic solution Place 1 drop into the right eye 2 (two) times daily.   ketorolac (ACULAR) 0.5 % ophthalmic solution Place 1 drop into both eyes 4 (four) times daily.   prednisoLONE acetate (PRED FORTE) 1 % ophthalmic suspension Place 1 drop into both eyes 4 (four) times daily.   No current facility-administered medications for this visit. (Ophthalmic Drugs)   Current Outpatient Medications (Other)  Medication Sig   ADVAIR DISKUS 500-50 MCG/DOSE AEPB Inhale 1 puff into the lungs 2 (two) times daily.   albuterol (VENTOLIN HFA) 108 (90 Base) MCG/ACT inhaler Inhale 1-2 puffs into the lungs every 6 (six) hours as needed for wheezing or  shortness of breath.   alfuzosin (UROXATRAL) 10 MG 24 hr tablet TAKE 1 TABLET BY MOUTH EVERYDAY AT BEDTIME   aspirin EC 81 MG tablet Take 81 mg by mouth daily.   atorvastatin (LIPITOR) 40 MG tablet Take 40 mg by mouth daily.   carvedilol (COREG) 6.25 MG tablet TAKE 1 TABLET BY MOUTH TWICE A DAY WITH A MEAL   doxycycline (VIBRAMYCIN) 100 MG capsule Take 1 capsule (100 mg total) by mouth 2 (two) times daily.   Empagliflozin-metFORMIN HCl (SYNJARDY) 12.06-998 MG TABS Take 1 tablet by mouth 2 (two) times a day.    esomeprazole (NEXIUM) 40 MG capsule Take 40 mg by mouth daily at 12 noon.   insulin glargine, 2 Unit Dial, (TOUJEO MAX SOLOSTAR) 300 UNIT/ML Solostar Pen Inject 40 Units into the skin daily.    iron polysaccharides (NIFEREX) 150 MG capsule Take 150 mg by mouth daily.   lisinopril (ZESTRIL) 40 MG tablet Take 40 mg by mouth daily.   pantoprazole (PROTONIX) 40 MG tablet Take 40 mg by mouth daily.   promethazine-dextromethorphan (PROMETHAZINE-DM) 6.25-15 MG/5ML syrup Take 5 mLs by mouth 3 (three) times daily as needed for cough.   Semaglutide (OZEMPIC, 0.25 OR 0.5 MG/DOSE, [place]) Inject into the skin.   Semaglutide,0.25 or 0.5MG /DOS, (OZEMPIC, 0.25 OR 0.5 MG/DOSE,) 2 MG/1.5ML SOPN Inject into the skin.   SPIRIVA RESPIMAT 2.5 MCG/ACT AERS  INHALE 2 PUFFS BY MOUTH INTO THE LUNGS DAILY   No current facility-administered medications for this visit. (Other)      REVIEW OF SYSTEMS: ROS   Positive for: Gastrointestinal, Musculoskeletal, Endocrine, Eyes, Respiratory Negative for: Constitutional, Neurological, Skin, Genitourinary, HENT, Cardiovascular, Psychiatric, Allergic/Imm, Heme/Lymph Last edited by Doneen Poisson on 07/16/2020  2:56 PM.        ALLERGIES Allergies  Allergen Reactions   Naproxen Shortness Of Breath   Naproxen Sodium Shortness Of Breath   Penicillins Rash    Has patient had a PCN reaction causing immediate rash, facial/tongue/throat swelling, SOB or lightheadedness  with hypotension: Yes Has patient had a PCN reaction causing severe rash involving mucus membranes or skin necrosis: No Has patient had a PCN reaction that required hospitalization No Has patient had a PCN reaction occurring within the last 10 years: No If all of the above answers are "NO", then may proceed with Cephalosporin use.  Has patient had a PCN reaction causing immediate rash, facial/tongue/throat swelling, SOB or lightheadedness with hypotension: Yes Has patient had a PCN reaction causing severe rash involving mucus membranes or skin necrosis: No Has patient had a PCN reaction that required hospitalization No Has patient had a PCN reaction occurring within the last 10 years: No If all of the above answers are "NO", then may proceed with Cephalosporin use.     PAST MEDICAL HISTORY Past Medical History:  Diagnosis Date   Arthritis    Asthma    COPD (chronic obstructive pulmonary disease) ([place])    Diabetes mellitus    Type 2   Diabetic retinopathy ([place])    NPDR OU   GERD (gastroesophageal reflux disease)    H/O hiatal hernia    History of kidney stones    Hyperlipemia    Hypertension    Hypertensive retinopathy    OU   Nasal polyps    Pneumonia 2014   Shortness of breath    Sleep apnea    pt has CPAP but is unable to use it d/t having polyps in his nose. Pt stated "I need to call and get a CPAP mask instead"   Stomach ulcer    Past Surgical History:  Procedure Laterality Date   CATARACT EXTRACTION Right 07/05/2018   Dr. Gershon Crane   CATARACT EXTRACTION Left 07/12/2018   Dr. Gershon Crane   CHOLECYSTECTOMY N/A 01/23/2015   Procedure: LAPAROSCOPIC CHOLECYSTECTOMY ;  Surgeon: Georganna Skeans, MD;  Location: [facility];  Service: General;  Laterality: N/A;   COLONOSCOPY W/ POLYPECTOMY     ESOPHAGOGASTRODUODENOSCOPY     EYE SURGERY     KNEE ARTHROSCOPY  03/24/2011   Procedure: ARTHROSCOPY KNEE;  Surgeon: Alta Corning, MD;  Location: [facility];  Service:  Orthopedics;  Laterality: Right;  partial medial menisectomy, chondroplaty patella, and medial medial plica   LEFT HEART CATH AND CORONARY ANGIOGRAPHY N/A 06/29/2019   Procedure: LEFT HEART CATH AND CORONARY ANGIOGRAPHY;  Surgeon: Wellington Hampshire, MD;  Location: [facility];  Service: Cardiovascular;  Laterality: N/A;    FAMILY HISTORY Family History  Problem Relation Age of Onset   Diabetes Father     SOCIAL HISTORY Social History   Tobacco Use   Smoking status: Former    Packs/day: 3.00    Years: 20.00    Pack years: 60.00    Types: Cigarettes   Smokeless tobacco: Current    Types: Chew   Tobacco comments:    1 can/daily  Substance Use Topics  Alcohol use: Yes    Comment: social, 1x weekly   Drug use: No         OPHTHALMIC EXAM:  Base Eye Exam     Visual Acuity (Snellen - Linear)       Right Left   Dist [place] 20/20 -2 20/60 -1   Dist ph [place]  20/40 -1         Tonometry (Tonopen, 3:06 PM)       Right Left   Pressure 12 14         Pupils       Dark Light Shape React APD   Right 3 2 Round Minimal 0   Left 3 2 Round Minimal 0         Visual Fields       Left Right    Full Full         Extraocular Movement       Right Left    Full Full         Neuro/Psych     Oriented x3: Yes   Mood/Affect: Normal         Dilation     Both eyes: 2.5% Phenylephrine, 1.0% Mydriacyl @ 3:06 PM           Slit Lamp and Fundus Exam     Slit Lamp Exam       Right Left   Lids/Lashes Dermatochalasis - upper lid, Telangiectasia, mild Meibomian gland dysfunction Dermatochalasis - upper lid, Telangiectasia, mild Meibomian gland dysfunction   Conjunctiva/Sclera White and quiet, +concretions on palpebral conj Mild nasal Pinguecula, +concretions on palpebral conj   Cornea Mild Arcus, Well healed temporal cataract wounds Arcus, 1+ Descemet's folds, Well healed temporal cataract wounds   Anterior Chamber deep, 1+ cell/pigment deep, 1+ cell/pigment    Iris Round and dilated, mild patch of atrophy at 0800, no NVI Round and dilated, No NVI   Lens PC IOL in good position PC IOL in good position   Vitreous Vitreous syneresis Vitreous syneresis         Fundus Exam       Right Left   Disc Pink and Sharp Pallor, Sharp rim   C/D Ratio 0.3 0.3   Macula Blunted foveal reflex, scattered IRH/DBH/exudates -- greatest temporal macula - improved, trace residual cystic changes remain Blunted foveal reflex, +central edema -- slightly improved, scattered DBH and MAs, scattered punctate exudates - greatest superior and temporal macula -- slightly improved   Vessels Vascular attenuation, Tortuous Vascular attenuation, Tortuous   Periphery Attached, scattered MA and exudates greatest posteriorly Attached, pigmented choroidal nevus superiorly with mild elevation, +drusen, no SRF or orage pigment -- unchanged from prior            IMAGING AND PROCEDURES  Imaging and Procedures for @TODAY @  OCT, Retina - OU - Both Eyes       Right Eye Quality was good. Central Foveal Thickness: 264. Progression has improved. Findings include abnormal foveal contour, intraretinal hyper-reflective material, intraretinal fluid, outer retinal atrophy, no SRF (Interval improvement in IRF/cystic changes).   Left Eye Quality was good. Central Foveal Thickness: 240. Progression has improved. Findings include intraretinal fluid, intraretinal hyper-reflective material, abnormal foveal contour, no SRF, outer retinal atrophy (Interval improvement in IRF/IRHM greatest temporal macula, Sub-RPE hyper-reflective mass consistent with choroidal nevus--superior to disc, caught on widefield -- stable from prior).   Notes *Images captured and stored on drive  Diagnosis / Impression: Severe DME OU OD: Interval improvement in IRF/cystic  changes OS: interval improvement in IRF/IRHM greatest temporal macula, Sub-RPE hyper-reflective mass consistent with choroidal nevus--superior to  disc, caught on widefield -- stable from prior  Clinical management:  See below  Abbreviations: NFP - Normal foveal profile. CME - cystoid macular edema. PED - pigment epithelial detachment. IRF - intraretinal fluid. SRF - subretinal fluid. EZ - ellipsoid zone. ERM - epiretinal membrane. ORA - outer retinal atrophy. ORT - outer retinal tubulation. SRHM - subretinal hyper-reflective material      Intravitreal Injection, Pharmacologic Agent - OD - Right Eye       Time Out 07/16/2020. 3:47 PM. Confirmed correct patient, procedure, site, and patient consented.   Anesthesia Topical anesthesia was used. Anesthetic medications included Lidocaine 2%, Proparacaine 0.5%.   Procedure Preparation included 5% betadine to ocular surface, eyelid speculum. A (32g) needle was used.   Injection: 2 mg aflibercept 2 MG/0.05ML   Route: Intravitreal, Site: Right Eye   NDC: A3590391, Lot: [id number], Expiration date: 03/04/2021, Waste: 0.05 mL   Post-op Post injection exam found visual acuity of at least counting fingers. The patient tolerated the procedure well. There were no complications. The patient received written and verbal post procedure care education. Post injection medications were not given.      Intravitreal Injection, Pharmacologic Agent - OS - Left Eye       Time Out 07/16/2020. 3:50 PM. Confirmed correct patient, procedure, site, and patient consented.   Anesthesia Topical anesthesia was used. Anesthetic medications included Lidocaine 2%, Proparacaine 0.5%.   Procedure Preparation included 5% betadine to ocular surface, eyelid speculum. A supplied (32g) needle was used.   Injection: 2 mg aflibercept 2 MG/0.05ML   Route: Intravitreal, Site: Left Eye   NDC: M7179715, Lot: [id number], Expiration date: 01/01/2021, Waste: 0.05 mL   Post-op Post injection exam found visual acuity of at least counting fingers. The patient tolerated the procedure well. There were no  complications. The patient received written and verbal post procedure care education.               ASSESSMENT/PLAN:    ICD-10-CM   1. Severe nonproliferative diabetic retinopathy of both eyes with macular edema associated with type 2 diabetes mellitus (HCC)  S56.8127 Intravitreal Injection, Pharmacologic Agent - OD - Right Eye    Intravitreal Injection, Pharmacologic Agent - OS - Left Eye    aflibercept (EYLEA) SOLN 2 mg    aflibercept (EYLEA) SOLN 2 mg    2. Retinal edema  H35.81 OCT, Retina - OU - Both Eyes    3. 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 Essential hypertension  I10     5. Hypertensive retinopathy of both eyes  H35.033     6. Pseudophakia of both eyes  Z96.1     7. Ocular hypertension of right eye  H40.051        1. Severe nonproliferative diabetic retinopathy with DME, OU (OS>OD)  - s/p IVA OD #1 (06.26.20), #2 (08.14.20), #3 (09.17.20), #4 (10.16.20), #5 (02.02.22)  - s/p IVA OS #1 (07.17.20), #2 (08.14.20), #3 (09.17.20)  - s/p IVE OS #1 (10.16.20) - sample, #2 (11.13.20), #3 (12.11.20), #4 (01.18.21), #5 (02.19.21), #6 (03.19.21), #7 (04.16.21), #8 (05.14.21), #9 (06.17.21), #10 (7.16.21), #11 (09.21.21), #12 (10.29.21), #13 (11.29.21), #14 (12.29.21), #15 (02.02.22, sample), #16 (03.09.22), #17 (04.06.22), #18 (05.11.2012)  - s/p IVE OD #1 (11.13.20), #2 (12.11.20), #3 (01.18.21), #4 (02.19.21), #5 (03.19.21), #6 (04.16.21), #7 (05.14.21), #8 (06.17.21), #9 (07.16.21), #10 (09.21.21), #11 (10.29.21), #12 (  11.29.21), #13 (12.29.21), #14 (03.09.22), #15 (04.06.22), #16 (05.11.12)  - FA (06.26.20) shows Severe NPDR OU w/ Late leaking MA OU, Enlarged FAZ OU, No NV OU, No significant hyperfluorescence of disc  - BCVA stable OU: OD 20/20; OS 20/40   - OCT shows OD: Interval improvement in IRF/cystic changes; OS: interval improvement in IRF/IRHM greatest temporal macula, Sub-RPE hyper-reflective mass consistent with choroidal nevus--superior to disc, caught  on widefield -- stable from prior  - recommend IVE OD #17 and IVE OS #19 today, 06.15.22 -- full Eylea authorization approved for 2022  - pt wishes to proceed  - RBA of procedure discussed, questions answered  - informed consent obtained  - Eylea informed consent form signed and scanned on 01.18.2021  - see procedure note  - [email or web address] Benefits Investigation initiated 10.16.2020 -- approved for 2022  - f/u in 5 weeks -- DFE/OCT/possible injection OU  2. Retinal Edema OU  - based on FA, suspect majority of macular edema due to DM as above, but there may be some edema secondary to post-op CME  - cont PF qid OU and ketorolac qid OU   - history of difficulty with compliance at work  - monitor  - f/u in 5 weeks  3. Choroidal Nevus OS  - located at 1200, mild elevation, +drusen, no SRF  - baseline optos pictures obtained, 06.26.20  - discussed possible referral to oncology at [facility] in [place]  4,5.Hypertensive retinopathy OU  - discussed importance of tight BP control  - monitor  6. Pseudophakia OU  - s/p CE/IOL OU (OD on 06.03.20 and OS 06.10.20 by Dr. Gershon Crane)  - beautiful surgeries w/ IOLs in excellent position  - macular edema limiting vision as above  - monitor  7. Ocular hypertension OD  - IOP 12 OD  - cont Cosopt BID OD  - monitor   Ophthalmic Meds Ordered this visit:  Meds ordered this encounter  Medications   aflibercept (EYLEA) SOLN 2 mg   aflibercept (EYLEA) SOLN 2 mg       Return in about 5 weeks (around 08/20/2020) for f/u NPDR OU, DFE, OCT.  There are no Patient Instructions on file for this visit.  This document serves as a record of services personally performed by Gardiner Sleeper, MD, PhD. It was created on their behalf by Roselee Nova, COMT. The creation of this record is the provider's dictation and/or activities during the visit.  Electronically signed by: Roselee Nova, COMT 07/16/20 5:18 PM   This document serves as a record of services  personally performed by Gardiner Sleeper, MD, PhD. It was created on their behalf by San Jetty. Owens Shark, OA an ophthalmic technician. The creation of this record is the provider's dictation and/or activities during the visit.    Electronically signed by: San Jetty. Owens Shark, [place] 06.15.2022 5:18 PM   Gardiner Sleeper, M.D., Ph.D. Diseases & Surgery of the Retina and Vitreous [facility]  I have reviewed the above documentation for accuracy and completeness, and I agree with the above. Gardiner Sleeper, M.D., Ph.D. 07/16/20 5:18 PM   Abbreviations: M myopia (nearsighted); A astigmatism; H hyperopia (farsighted); P presbyopia; Mrx spectacle prescription;  CTL contact lenses; OD right eye; OS left eye; OU both eyes  XT exotropia; ET esotropia; PEK punctate epithelial keratitis; PEE punctate epithelial erosions; DES dry eye syndrome; MGD meibomian gland dysfunction; ATs artificial tears; PFAT's preservative free artificial tears; [place] nuclear sclerotic cataract; PSC posterior subcapsular cataract;  ERM epi-retinal membrane; PVD posterior vitreous detachment; RD retinal detachment; DM diabetes mellitus; DR diabetic retinopathy; NPDR non-proliferative diabetic retinopathy; PDR proliferative diabetic retinopathy; CSME clinically significant macular edema; DME diabetic macular edema; dbh dot blot hemorrhages; CWS cotton wool spot; POAG primary open angle glaucoma; C/D cup-to-disc ratio; HVF humphrey visual field; GVF goldmann visual field; OCT optical coherence tomography; IOP intraocular pressure; BRVO Branch retinal vein occlusion; CRVO central retinal vein occlusion; CRAO central retinal artery occlusion; BRAO branch retinal artery occlusion; RT retinal tear; SB scleral buckle; PPV pars plana vitrectomy; VH Vitreous hemorrhage; PRP panretinal laser photocoagulation; IVK intravitreal kenalog; VMT vitreomacular traction; MH Macular hole;  NVD neovascularization of the disc; NVE neovascularization  elsewhere; AREDS age related eye disease study; ARMD age related macular degeneration; POAG primary open angle glaucoma; EBMD epithelial/anterior basement membrane dystrophy; ACIOL anterior chamber intraocular lens; IOL intraocular lens; PCIOL posterior chamber intraocular lens; Phaco/IOL phacoemulsification with intraocular lens placement; [place] photorefractive keratectomy; LASIK laser assisted in situ keratomileusis; HTN hypertension; DM diabetes mellitus; COPD chronic obstructive pulmonary disease

## 2020-07-15 NOTE — Discharge Instructions (Addendum)
Resume use of inhalers to help with constriction noted on exam.  I prescribed you doxycycline for treatment of bronchitis.  Promethazine DM for cough.  If shortness of breath becomes severe go immediately to the emergency department for work-up and evaluation.

## 2020-07-16 ENCOUNTER — Encounter (INDEPENDENT_AMBULATORY_CARE_PROVIDER_SITE_OTHER): Payer: Self-pay | Admitting: Ophthalmology

## 2020-07-16 ENCOUNTER — Ambulatory Visit (INDEPENDENT_AMBULATORY_CARE_PROVIDER_SITE_OTHER): Payer: BC Managed Care – PPO | Admitting: Ophthalmology

## 2020-07-16 VITALS — BP 145/87 | HR 109

## 2020-07-16 DIAGNOSIS — D3132 Benign neoplasm of left choroid: Secondary | ICD-10-CM | POA: Diagnosis not present

## 2020-07-16 DIAGNOSIS — I10 Essential (primary) hypertension: Secondary | ICD-10-CM | POA: Diagnosis not present

## 2020-07-16 DIAGNOSIS — H3581 Retinal edema: Secondary | ICD-10-CM | POA: Diagnosis not present

## 2020-07-16 DIAGNOSIS — H35033 Hypertensive retinopathy, bilateral: Secondary | ICD-10-CM | POA: Diagnosis not present

## 2020-07-16 DIAGNOSIS — E113413 Type 2 diabetes mellitus with severe nonproliferative diabetic retinopathy with macular edema, bilateral: Secondary | ICD-10-CM | POA: Diagnosis not present

## 2020-07-16 DIAGNOSIS — Z961 Presence of intraocular lens: Secondary | ICD-10-CM

## 2020-07-16 DIAGNOSIS — H40051 Ocular hypertension, right eye: Secondary | ICD-10-CM

## 2020-07-16 MED ORDER — AFLIBERCEPT 2MG/0.05ML IZ SOLN FOR KALEIDOSCOPE
2.0000 mg | INTRAVITREAL | Status: AC | PRN
  Administered 2020-07-16: 2 mg via INTRAVITREAL

## 2020-08-19 NOTE — Progress Notes (Signed)
[facility] Note  08/20/2020     CHIEF COMPLAINT Patient presents for Retina Follow Up   HISTORY OF PRESENT ILLNESS: Juan [name] is a 60 y.o. male who presents to the clinic today for:   HPI     Retina Follow Up   Patient presents with  Diabetic Retinopathy.  In both eyes.  This started weeks ago.  Severity is moderate.  Duration of weeks.  Since onset it is stable.  I, the attending physician,  performed the HPI with the patient and updated documentation appropriately.        Comments   Pt states vision is the same OU.  Pt denies eye pain or discomfort and denies any new or worsening floaters or fol OU.      Last edited by Bernarda Caffey, MD on 08/20/2020  9:14 PM.    pt states   Referring physician: Harlan Stains, MD [place],  [place] [address]  HISTORICAL INFORMATION:   Selected notes from the MEDICAL RECORD NUMBER Referred by Dr. Rutherford Guys for concern of CME s/p cataract sx   CURRENT MEDICATIONS: Current Outpatient Medications (Ophthalmic Drugs)  Medication Sig   dorzolamide-timolol (COSOPT) 22.3-6.8 MG/ML ophthalmic solution Place 1 drop into the right eye 2 (two) times daily.   ketorolac (ACULAR) 0.5 % ophthalmic solution Place 1 drop into both eyes 4 (four) times daily.   prednisoLONE acetate (PRED FORTE) 1 % ophthalmic suspension Place 1 drop into both eyes 4 (four) times daily.   No current facility-administered medications for this visit. (Ophthalmic Drugs)   Current Outpatient Medications (Other)  Medication Sig   ADVAIR DISKUS 500-50 MCG/DOSE AEPB Inhale 1 puff into the lungs 2 (two) times daily.   albuterol (VENTOLIN HFA) 108 (90 Base) MCG/ACT inhaler Inhale 1-2 puffs into the lungs every 6 (six) hours as needed for wheezing or shortness of breath.   alfuzosin (UROXATRAL) 10 MG 24 hr tablet TAKE 1 TABLET BY MOUTH EVERYDAY AT BEDTIME   aspirin EC 81 MG tablet Take 81 mg by mouth daily.    atorvastatin (LIPITOR) 40 MG tablet Take 40 mg by mouth daily.   carvedilol (COREG) 6.25 MG tablet TAKE 1 TABLET BY MOUTH TWICE A DAY WITH A MEAL   doxycycline (VIBRAMYCIN) 100 MG capsule Take 1 capsule (100 mg total) by mouth 2 (two) times daily.   Empagliflozin-metFORMIN HCl (SYNJARDY) 12.06-998 MG TABS Take 1 tablet by mouth 2 (two) times a day.    esomeprazole (NEXIUM) 40 MG capsule Take 40 mg by mouth daily at 12 noon.   insulin glargine, 2 Unit Dial, (TOUJEO MAX SOLOSTAR) 300 UNIT/ML Solostar Pen Inject 40 Units into the skin daily.    iron polysaccharides (NIFEREX) 150 MG capsule Take 150 mg by mouth daily.   lisinopril (ZESTRIL) 40 MG tablet Take 40 mg by mouth daily.   pantoprazole (PROTONIX) 40 MG tablet Take 40 mg by mouth daily.   promethazine-dextromethorphan (PROMETHAZINE-DM) 6.25-15 MG/5ML syrup Take 5 mLs by mouth 3 (three) times daily as needed for cough.   Semaglutide (OZEMPIC, 0.25 OR 0.5 MG/DOSE, [place]) Inject into the skin.   Semaglutide,0.25 or 0.5MG /DOS, (OZEMPIC, 0.25 OR 0.5 MG/DOSE,) 2 MG/1.5ML SOPN Inject into the skin.   SPIRIVA RESPIMAT 2.5 MCG/ACT AERS INHALE 2 PUFFS BY MOUTH INTO THE LUNGS DAILY   No current facility-administered medications for this visit. (Other)      REVIEW OF SYSTEMS: ROS   Positive for: Gastrointestinal, Musculoskeletal,  Endocrine, Eyes, Respiratory Negative for: Constitutional, Neurological, Skin, Genitourinary, HENT, Cardiovascular, Psychiatric, Allergic/Imm, Heme/Lymph Last edited by Doneen Poisson on 08/20/2020  2:38 PM.         ALLERGIES Allergies  Allergen Reactions   Naproxen Shortness Of Breath   Naproxen Sodium Shortness Of Breath   Penicillins Rash    Has patient had a PCN reaction causing immediate rash, facial/tongue/throat swelling, SOB or lightheadedness with hypotension: Yes Has patient had a PCN reaction causing severe rash involving mucus membranes or skin necrosis: No Has patient had a PCN reaction that  required hospitalization No Has patient had a PCN reaction occurring within the last 10 years: No If all of the above answers are "NO", then may proceed with Cephalosporin use.  Has patient had a PCN reaction causing immediate rash, facial/tongue/throat swelling, SOB or lightheadedness with hypotension: Yes Has patient had a PCN reaction causing severe rash involving mucus membranes or skin necrosis: No Has patient had a PCN reaction that required hospitalization No Has patient had a PCN reaction occurring within the last 10 years: No If all of the above answers are "NO", then may proceed with Cephalosporin use.     PAST MEDICAL HISTORY Past Medical History:  Diagnosis Date   Arthritis    Asthma    COPD (chronic obstructive pulmonary disease) ([place])    Diabetes mellitus    Type 2   Diabetic retinopathy ([place])    NPDR OU   GERD (gastroesophageal reflux disease)    H/O hiatal hernia    History of kidney stones    Hyperlipemia    Hypertension    Hypertensive retinopathy    OU   Nasal polyps    Pneumonia 2014   Shortness of breath    Sleep apnea    pt has CPAP but is unable to use it d/t having polyps in his nose. Pt stated "I need to call and get a CPAP mask instead"   Stomach ulcer    Past Surgical History:  Procedure Laterality Date   CATARACT EXTRACTION Right 07/05/2018   Dr. Gershon Crane   CATARACT EXTRACTION Left 07/12/2018   Dr. Gershon Crane   CHOLECYSTECTOMY N/A 01/23/2015   Procedure: LAPAROSCOPIC CHOLECYSTECTOMY ;  Surgeon: Georganna Skeans, MD;  Location: [facility];  Service: General;  Laterality: N/A;   COLONOSCOPY W/ POLYPECTOMY     ESOPHAGOGASTRODUODENOSCOPY     EYE SURGERY     KNEE ARTHROSCOPY  03/24/2011   Procedure: ARTHROSCOPY KNEE;  Surgeon: Alta Corning, MD;  Location: [facility];  Service: Orthopedics;  Laterality: Right;  partial medial menisectomy, chondroplaty patella, and medial medial plica   LEFT HEART CATH AND CORONARY ANGIOGRAPHY N/A 06/29/2019    Procedure: LEFT HEART CATH AND CORONARY ANGIOGRAPHY;  Surgeon: Wellington Hampshire, MD;  Location: [facility];  Service: Cardiovascular;  Laterality: N/A;    FAMILY HISTORY Family History  Problem Relation Age of Onset   Diabetes Father     SOCIAL HISTORY Social History   Tobacco Use   Smoking status: Former    Packs/day: 3.00    Years: 20.00    Pack years: 60.00    Types: Cigarettes   Smokeless tobacco: Current    Types: Chew   Tobacco comments:    1 can/daily  Substance Use Topics   Alcohol use: Yes    Comment: social, 1x weekly   Drug use: No         OPHTHALMIC EXAM:  Base Eye Exam  Visual Acuity (Snellen - Linear)       Right Left   Dist [place] 20/20 -2 20/70 -1   Dist ph [place]  20/40 -2         Tonometry (Tonopen, 2:42 PM)       Right Left   Pressure 17 19         Pupils       Dark Light Shape React APD   Right 3 2 Round Minimal 0   Left 3 2 Round Minimal 0         Visual Fields       Left Right    Full Full         Extraocular Movement       Right Left    Full Full         Neuro/Psych     Oriented x3: Yes   Mood/Affect: Normal         Dilation     Both eyes: 1.0% Mydriacyl, 2.5% Phenylephrine @ 2:42 PM           Slit Lamp and Fundus Exam     Slit Lamp Exam       Right Left   Lids/Lashes Dermatochalasis - upper lid, Telangiectasia, mild Meibomian gland dysfunction Dermatochalasis - upper lid, Telangiectasia, mild Meibomian gland dysfunction   Conjunctiva/Sclera White and quiet, +concretions on palpebral conj Mild nasal Pinguecula, +concretions on palpebral conj   Cornea Mild Arcus, Well healed temporal cataract wounds Arcus, 1+ Descemet's folds, Well healed temporal cataract wounds   Anterior Chamber deep, 1+ cell/pigment deep, 1+ cell/pigment   Iris Round and dilated, mild patch of atrophy at 0800, no NVI Round and dilated, No NVI   Lens PC IOL in good position PC IOL in good position   Vitreous Vitreous  syneresis Vitreous syneresis         Fundus Exam       Right Left   Disc Pink and Sharp Pallor, Sharp rim   C/D Ratio 0.3 0.3   Macula Blunted foveal reflex, scattered IRH/DBH/exudates -- greatest temporal macula - improved, trace residual cystic changes remain Blunted foveal reflex, +central edema -- slightly improved, scattered DBH and MAs, scattered punctate exudates - greatest superior and temporal macula -- persistent   Vessels Vascular attenuation, Tortuous Vascular attenuation, Tortuous   Periphery Attached, scattered MA and exudates greatest posteriorly Attached, pigmented choroidal nevus superiorly with mild elevation, +drusen, no SRF or orage pigment -- unchanged from prior            IMAGING AND PROCEDURES  Imaging and Procedures for @TODAY @  OCT, Retina - OU - Both Eyes       Right Eye Quality was good. Central Foveal Thickness: 260. Progression has been stable. Findings include abnormal foveal contour, intraretinal hyper-reflective material, intraretinal fluid, outer retinal atrophy, no SRF (Persistent central and ST cystic changes).   Left Eye Quality was good. Central Foveal Thickness: 239. Progression has been stable. Findings include intraretinal fluid, intraretinal hyper-reflective material, abnormal foveal contour, no SRF, outer retinal atrophy (Persistent cystic changes greatest temporal macula, Sub-RPE hyper-reflective mass consistent with choroidal nevus--superior to disc, caught on widefield -- stable from prior).   Notes *Images captured and stored on drive  Diagnosis / Impression: +DME OU OD: Persistent central and ST cystic changes OS: Persistent cystic changes greatest temporal macula, Sub-RPE hyper-reflective mass consistent with choroidal nevus--superior to disc, caught on widefield -- stable from prior  Clinical management:  See below  Abbreviations:  NFP - Normal foveal profile. CME - cystoid macular edema. PED - pigment epithelial detachment.  IRF - intraretinal fluid. SRF - subretinal fluid. EZ - ellipsoid zone. ERM - epiretinal membrane. ORA - outer retinal atrophy. ORT - outer retinal tubulation. SRHM - subretinal hyper-reflective material      Intravitreal Injection, Pharmacologic Agent - OD - Right Eye       Time Out 08/20/2020. 3:18 PM. Confirmed correct patient, procedure, site, and patient consented.   Anesthesia Topical anesthesia was used. Anesthetic medications included Lidocaine 2%, Proparacaine 0.5%.   Procedure Preparation included 5% betadine to ocular surface, eyelid speculum. A (32g) needle was used.   Injection: 2 mg aflibercept 2 MG/0.05ML   Route: Intravitreal, Site: Right Eye   NDC: A3590391, Lot: [id number], Expiration date: 05/21/2021, Waste: 0.05 mL   Post-op Post injection exam found visual acuity of at least counting fingers. The patient tolerated the procedure well. There were no complications. The patient received written and verbal post procedure care education. Post injection medications were not given.      Intravitreal Injection, Pharmacologic Agent - OS - Left Eye       Time Out 08/20/2020. 3:19 PM. Confirmed correct patient, procedure, site, and patient consented.   Anesthesia Topical anesthesia was used. Anesthetic medications included Lidocaine 2%, Proparacaine 0.5%.   Procedure Preparation included 5% betadine to ocular surface, eyelid speculum. A (32g) needle was used.   Injection: 2 mg aflibercept 2 MG/0.05ML   Route: Intravitreal, Site: Left Eye   NDC: A3590391, Lot: [id number], Expiration date: 05/21/2021, Waste: 0.05 mL   Post-op Post injection exam found visual acuity of at least counting fingers. The patient tolerated the procedure well. There were no complications. The patient received written and verbal post procedure care education. Post injection medications were not given.                ASSESSMENT/PLAN:    ICD-10-CM   1. Severe nonproliferative  diabetic retinopathy of both eyes with macular edema associated with type 2 diabetes mellitus (HCC)  G25.4270 Intravitreal Injection, Pharmacologic Agent - OD - Right Eye    Intravitreal Injection, Pharmacologic Agent - OS - Left Eye    aflibercept (EYLEA) SOLN 2 mg    aflibercept (EYLEA) SOLN 2 mg    2. Retinal edema  H35.81 OCT, Retina - OU - Both Eyes    3. 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 Essential hypertension  I10     5. Hypertensive retinopathy of both eyes  H35.033     6. Pseudophakia of both eyes  Z96.1     7. Ocular hypertension of right eye  H40.051         1. Severe nonproliferative diabetic retinopathy with DME, OU (OS>OD)  - s/p IVA OD #1 (06.26.20), #2 (08.14.20), #3 (09.17.20), #4 (10.16.20), #5 (02.02.22)  - s/p IVA OS #1 (07.17.20), #2 (08.14.20), #3 (09.17.20)  - s/p IVE OS #1 (10.16.20) - sample, #2 (11.13.20), #3 (12.11.20), #4 (01.18.21), #5 (02.19.21), #6 (03.19.21), #7 (04.16.21), #8 (05.14.21), #9 (06.17.21), #10 (7.16.21), #11 (09.21.21), #12 (10.29.21), #13 (11.29.21), #14 (12.29.21), #15 (02.02.22, sample), #16 (03.09.22), #17 (04.06.22), #18 (05.11.2012), #19 (06.15.22)  - s/p IVE OD #1 (11.13.20), #2 (12.11.20), #3 (01.18.21), #4 (02.19.21), #5 (03.19.21), #6 (04.16.21), #7 (05.14.21), #8 (06.17.21), #9 (07.16.21), #10 (09.21.21), #11 (10.29.21), #12 (11.29.21), #13 (12.29.21), #14 (03.09.22), #15 (04.06.22), #16 (05.11.12), #17 (06.15.22)  - FA (06.26.20) shows Severe NPDR OU w/ Late leaking MA OU,  Enlarged FAZ OU, No NV OU, No significant hyperfluorescence of disc  - BCVA stable OU: OD 20/20; OS 20/40   - OCT shows OD: Persistent central and ST cystic changes; OS: Persistent cystic changes greatest temporal macula, Sub-RPE hyper-reflective mass consistent with choroidal nevus--superior to disc, caught on widefield -- stable from prior  - recommend IVE OD #18 and IVE OS #20 today, 07.20.22 -- full Eylea authorization approved for 2022  - pt  wishes to proceed  - RBA of procedure discussed, questions answered  - informed consent obtained  - Eylea informed consent form signed and scanned on 01.18.2021  - see procedure note  - [email or web address] Benefits Investigation initiated 10.16.2020 -- approved for 2022  - f/u in 5 weeks -- DFE/OCT/possible injection OU  2. Retinal Edema OU  - based on FA, suspect majority of macular edema due to DM as above, but there may be some edema secondary to post-op CME  - cont PF qid OU and ketorolac qid OU   - history of difficulty with compliance at work  - monitor  - f/u in 5 weeks  3. Choroidal Nevus OS  - located at 1200, mild elevation, +drusen, no SRF  - baseline optos pictures obtained, 06.26.20  - discussed possible referral to oncology at [facility] in [place]  4,5.Hypertensive retinopathy OU  - discussed importance of tight BP control  - monitor  6. Pseudophakia OU  - s/p CE/IOL OU (OD on 06.03.20 and OS 06.10.20 by Dr. Gershon Crane)  - beautiful surgeries w/ IOLs in excellent position  - macular edema limiting vision as above  - monitor  7. Ocular hypertension OD  - IOP 17 OD  - cont Cosopt BID OD  - monitor   Ophthalmic Meds Ordered this visit:  Meds ordered this encounter  Medications   aflibercept (EYLEA) SOLN 2 mg   aflibercept (EYLEA) SOLN 2 mg        Return in about 5 weeks (around 09/24/2020) for f/u NPDR OU, DFE, OCT.  There are no Patient Instructions on file for this visit.  This document serves as a record of services personally performed by Gardiner Sleeper, MD, PhD. It was created on their behalf by Roselee Nova, COMT. The creation of this record is the provider's dictation and/or activities during the visit.  Electronically signed by: Roselee Nova, COMT 08/20/20 9:17 PM   This document serves as a record of services personally performed by Gardiner Sleeper, MD, PhD. It was created on their behalf by San Jetty. Owens Shark, OA an ophthalmic technician. The creation of  this record is the provider's dictation and/or activities during the visit.    Electronically signed by: San Jetty. Owens Shark, OA @TODAY @ 9:17 PM  Gardiner Sleeper, M.D., Ph.D. Diseases & Surgery of the Retina and Vitreous [facility]  I have reviewed the above documentation for accuracy and completeness, and I agree with the above. Gardiner Sleeper, M.D., Ph.D. 08/20/20 9:17 PM   Abbreviations: M myopia (nearsighted); A astigmatism; H hyperopia (farsighted); P presbyopia; Mrx spectacle prescription;  CTL contact lenses; OD right eye; OS left eye; OU both eyes  XT exotropia; ET esotropia; PEK punctate epithelial keratitis; PEE punctate epithelial erosions; DES dry eye syndrome; MGD meibomian gland dysfunction; ATs artificial tears; PFAT's preservative free artificial tears; [place] nuclear sclerotic cataract; PSC posterior subcapsular cataract; ERM epi-retinal membrane; PVD posterior vitreous detachment; RD retinal detachment; DM diabetes mellitus; DR diabetic retinopathy; NPDR non-proliferative diabetic retinopathy; PDR proliferative  diabetic retinopathy; CSME clinically significant macular edema; DME diabetic macular edema; dbh dot blot hemorrhages; CWS cotton wool spot; POAG primary open angle glaucoma; C/D cup-to-disc ratio; HVF humphrey visual field; GVF goldmann visual field; OCT optical coherence tomography; IOP intraocular pressure; BRVO Branch retinal vein occlusion; CRVO central retinal vein occlusion; CRAO central retinal artery occlusion; BRAO branch retinal artery occlusion; RT retinal tear; SB scleral buckle; PPV pars plana vitrectomy; VH Vitreous hemorrhage; PRP panretinal laser photocoagulation; IVK intravitreal kenalog; VMT vitreomacular traction; MH Macular hole;  NVD neovascularization of the disc; NVE neovascularization elsewhere; AREDS age related eye disease study; ARMD age related macular degeneration; POAG primary open angle glaucoma; EBMD epithelial/anterior basement  membrane dystrophy; ACIOL anterior chamber intraocular lens; IOL intraocular lens; PCIOL posterior chamber intraocular lens; Phaco/IOL phacoemulsification with intraocular lens placement; [place] photorefractive keratectomy; LASIK laser assisted in situ keratomileusis; HTN hypertension; DM diabetes mellitus; COPD chronic obstructive pulmonary disease

## 2020-08-20 ENCOUNTER — Ambulatory Visit (INDEPENDENT_AMBULATORY_CARE_PROVIDER_SITE_OTHER): Payer: BC Managed Care – PPO | Admitting: Ophthalmology

## 2020-08-20 ENCOUNTER — Other Ambulatory Visit: Payer: Self-pay

## 2020-08-20 ENCOUNTER — Encounter (INDEPENDENT_AMBULATORY_CARE_PROVIDER_SITE_OTHER): Payer: Self-pay | Admitting: Ophthalmology

## 2020-08-20 DIAGNOSIS — H3581 Retinal edema: Secondary | ICD-10-CM | POA: Diagnosis not present

## 2020-08-20 DIAGNOSIS — E113413 Type 2 diabetes mellitus with severe nonproliferative diabetic retinopathy with macular edema, bilateral: Secondary | ICD-10-CM

## 2020-08-20 DIAGNOSIS — H40051 Ocular hypertension, right eye: Secondary | ICD-10-CM

## 2020-08-20 DIAGNOSIS — D3132 Benign neoplasm of left choroid: Secondary | ICD-10-CM | POA: Diagnosis not present

## 2020-08-20 DIAGNOSIS — Z961 Presence of intraocular lens: Secondary | ICD-10-CM

## 2020-08-20 DIAGNOSIS — I10 Essential (primary) hypertension: Secondary | ICD-10-CM | POA: Diagnosis not present

## 2020-08-20 DIAGNOSIS — H35033 Hypertensive retinopathy, bilateral: Secondary | ICD-10-CM

## 2020-08-20 MED ORDER — AFLIBERCEPT 2MG/0.05ML IZ SOLN FOR KALEIDOSCOPE
2.0000 mg | INTRAVITREAL | Status: AC | PRN
  Administered 2020-08-20: 2 mg via INTRAVITREAL

## 2020-08-25 DIAGNOSIS — E1169 Type 2 diabetes mellitus with other specified complication: Secondary | ICD-10-CM | POA: Diagnosis not present

## 2020-08-25 DIAGNOSIS — E1140 Type 2 diabetes mellitus with diabetic neuropathy, unspecified: Secondary | ICD-10-CM | POA: Diagnosis not present

## 2020-08-25 DIAGNOSIS — E113413 Type 2 diabetes mellitus with severe nonproliferative diabetic retinopathy with macular edema, bilateral: Secondary | ICD-10-CM | POA: Diagnosis not present

## 2020-08-25 DIAGNOSIS — E1129 Type 2 diabetes mellitus with other diabetic kidney complication: Secondary | ICD-10-CM | POA: Diagnosis not present

## 2020-08-25 DIAGNOSIS — Z794 Long term (current) use of insulin: Secondary | ICD-10-CM | POA: Diagnosis not present

## 2020-08-25 DIAGNOSIS — E1159 Type 2 diabetes mellitus with other circulatory complications: Secondary | ICD-10-CM | POA: Diagnosis not present

## 2020-08-25 DIAGNOSIS — I152 Hypertension secondary to endocrine disorders: Secondary | ICD-10-CM | POA: Diagnosis not present

## 2020-09-12 DIAGNOSIS — E1169 Type 2 diabetes mellitus with other specified complication: Secondary | ICD-10-CM | POA: Diagnosis not present

## 2020-09-12 DIAGNOSIS — I10 Essential (primary) hypertension: Secondary | ICD-10-CM | POA: Diagnosis not present

## 2020-09-12 DIAGNOSIS — J449 Chronic obstructive pulmonary disease, unspecified: Secondary | ICD-10-CM | POA: Diagnosis not present

## 2020-09-12 DIAGNOSIS — E785 Hyperlipidemia, unspecified: Secondary | ICD-10-CM | POA: Diagnosis not present

## 2020-09-12 DIAGNOSIS — Z79899 Other long term (current) drug therapy: Secondary | ICD-10-CM | POA: Diagnosis not present

## 2020-09-23 NOTE — Progress Notes (Signed)
[facility] Note  09/24/2020     CHIEF COMPLAINT Patient presents for Retina Follow Up   HISTORY OF PRESENT ILLNESS: Juan [name] is a 60 y.o. male who presents to the clinic today for:   HPI     Retina Follow Up   Patient presents with  Diabetic Retinopathy.  In both eyes.  Severity is severe.  Duration of 5 weeks.  Since onset it is stable.  I, the attending physician,  performed the HPI with the patient and updated documentation appropriately.        Comments   Pt here for 5 wk ret f/u for NPDR OU. Pt states vision is the same, no changes noted. No ocular pain or discomfort. Blood sugar yesterday was 112, most recent A1C 6.5.       Last edited by Bernarda Caffey, MD on 09/26/2020  1:56 PM.     pt states no change in vision  Referring physician: Harlan Stains, MD [place],  [place] [id number]  HISTORICAL INFORMATION:   Selected notes from the MEDICAL RECORD NUMBER Referred by Dr. Rutherford Guys for concern of CME s/p cataract sx   CURRENT MEDICATIONS: Current Outpatient Medications (Ophthalmic Drugs)  Medication Sig   dorzolamide-timolol (COSOPT) 22.3-6.8 MG/ML ophthalmic solution Place 1 drop into the right eye 2 (two) times daily.   ketorolac (ACULAR) 0.5 % ophthalmic solution Place 1 drop into both eyes 4 (four) times daily.   prednisoLONE acetate (PRED FORTE) 1 % ophthalmic suspension Place 1 drop into both eyes 4 (four) times daily.   No current facility-administered medications for this visit. (Ophthalmic Drugs)   Current Outpatient Medications (Other)  Medication Sig   ADVAIR DISKUS 500-50 MCG/DOSE AEPB Inhale 1 puff into the lungs 2 (two) times daily.   albuterol (VENTOLIN HFA) 108 (90 Base) MCG/ACT inhaler Inhale 1-2 puffs into the lungs every 6 (six) hours as needed for wheezing or shortness of breath.   alfuzosin (UROXATRAL) 10 MG 24 hr tablet TAKE 1 TABLET BY MOUTH EVERYDAY AT BEDTIME   aspirin EC  81 MG tablet Take 81 mg by mouth daily.   atorvastatin (LIPITOR) 40 MG tablet Take 40 mg by mouth daily.   carvedilol (COREG) 6.25 MG tablet TAKE 1 TABLET BY MOUTH TWICE A DAY WITH A MEAL   doxycycline (VIBRAMYCIN) 100 MG capsule Take 1 capsule (100 mg total) by mouth 2 (two) times daily.   Empagliflozin-metFORMIN HCl (SYNJARDY) 12.06-998 MG TABS Take 1 tablet by mouth 2 (two) times a day.    esomeprazole (NEXIUM) 40 MG capsule Take 40 mg by mouth daily at 12 noon.   insulin glargine, 2 Unit Dial, (TOUJEO MAX SOLOSTAR) 300 UNIT/ML Solostar Pen Inject 40 Units into the skin daily.    iron polysaccharides (NIFEREX) 150 MG capsule Take 150 mg by mouth daily.   lisinopril (ZESTRIL) 40 MG tablet Take 40 mg by mouth daily.   pantoprazole (PROTONIX) 40 MG tablet Take 40 mg by mouth daily.   promethazine-dextromethorphan (PROMETHAZINE-DM) 6.25-15 MG/5ML syrup Take 5 mLs by mouth 3 (three) times daily as needed for cough.   Semaglutide (OZEMPIC, 0.25 OR 0.5 MG/DOSE, [place]) Inject into the skin.   Semaglutide,0.25 or 0.5MG /DOS, (OZEMPIC, 0.25 OR 0.5 MG/DOSE,) 2 MG/1.5ML SOPN Inject into the skin.   SPIRIVA RESPIMAT 2.5 MCG/ACT AERS INHALE 2 PUFFS BY MOUTH INTO THE LUNGS DAILY   No current facility-administered medications for this visit. (Other)    REVIEW OF  SYSTEMS: ROS   Positive for: Gastrointestinal, Musculoskeletal, Endocrine, Eyes, Respiratory Negative for: Constitutional, Neurological, Skin, Genitourinary, HENT, Cardiovascular, Psychiatric, Allergic/Imm, Heme/Lymph Last edited by Kingsley Spittle, COT on 09/24/2020  3:00 PM.      ALLERGIES Allergies  Allergen Reactions   Naproxen Shortness Of Breath   Naproxen Sodium Shortness Of Breath   Penicillins Rash    Has patient had a PCN reaction causing immediate rash, facial/tongue/throat swelling, SOB or lightheadedness with hypotension: Yes Has patient had a PCN reaction causing severe rash involving mucus membranes or skin necrosis: No Has  patient had a PCN reaction that required hospitalization No Has patient had a PCN reaction occurring within the last 10 years: No If all of the above answers are "NO", then may proceed with Cephalosporin use.  Has patient had a PCN reaction causing immediate rash, facial/tongue/throat swelling, SOB or lightheadedness with hypotension: Yes Has patient had a PCN reaction causing severe rash involving mucus membranes or skin necrosis: No Has patient had a PCN reaction that required hospitalization No Has patient had a PCN reaction occurring within the last 10 years: No If all of the above answers are "NO", then may proceed with Cephalosporin use.     PAST MEDICAL HISTORY Past Medical History:  Diagnosis Date   Arthritis    Asthma    COPD (chronic obstructive pulmonary disease) ([place])    Diabetes mellitus    Type 2   Diabetic retinopathy ([place])    NPDR OU   GERD (gastroesophageal reflux disease)    H/O hiatal hernia    History of kidney stones    Hyperlipemia    Hypertension    Hypertensive retinopathy    OU   Nasal polyps    Pneumonia 2014   Shortness of breath    Sleep apnea    pt has CPAP but is unable to use it d/t having polyps in his nose. Pt stated "I need to call and get a CPAP mask instead"   Stomach ulcer    Past Surgical History:  Procedure Laterality Date   CATARACT EXTRACTION Right 07/05/2018   Dr. Gershon Crane   CATARACT EXTRACTION Left 07/12/2018   Dr. Gershon Crane   CHOLECYSTECTOMY N/A 01/23/2015   Procedure: LAPAROSCOPIC CHOLECYSTECTOMY ;  Surgeon: Georganna Skeans, MD;  Location: [facility] [facility];  Service: General;  Laterality: N/A;   COLONOSCOPY W/ POLYPECTOMY     ESOPHAGOGASTRODUODENOSCOPY     EYE SURGERY     KNEE ARTHROSCOPY  03/24/2011   Procedure: ARTHROSCOPY KNEE;  Surgeon: Alta Corning, MD;  Location: [facility];  Service: Orthopedics;  Laterality: Right;  partial medial menisectomy, chondroplaty patella, and medial medial plica   LEFT HEART CATH AND  CORONARY ANGIOGRAPHY N/A 06/29/2019   Procedure: LEFT HEART CATH AND CORONARY ANGIOGRAPHY;  Surgeon: Wellington Hampshire, MD;  Location: [facility];  Service: Cardiovascular;  Laterality: N/A;    FAMILY HISTORY Family History  Problem Relation Age of Onset   Diabetes Father     SOCIAL HISTORY Social History   Tobacco Use   Smoking status: Former    Packs/day: 3.00    Years: 20.00    Pack years: 60.00    Types: Cigarettes   Smokeless tobacco: Current    Types: Chew   Tobacco comments:    1 can/daily  Substance Use Topics   Alcohol use: Yes    Comment: social, 1x weekly   Drug use: No         OPHTHALMIC EXAM:  Base Eye Exam     Visual Acuity (Snellen - Linear)       Right Left   Dist [place] 20/25 -1 20/70 -1   Dist ph [place]  20/50 -2         Tonometry (Tonopen, 3:06 PM)       Right Left   Pressure 13 13         Pupils       Dark Light Shape React APD   Right 3 2 Round Brisk None   Left 3 2 Round Brisk None         Visual Fields (Counting fingers)       Left Right    Full Full         Extraocular Movement       Right Left    Full, Ortho Full, Ortho         Neuro/Psych     Oriented x3: Yes   Mood/Affect: Normal         Dilation     Both eyes: 1.0% Mydriacyl, 2.5% Phenylephrine @ 3:07 PM           Slit Lamp and Fundus Exam     Slit Lamp Exam       Right Left   Lids/Lashes Dermatochalasis - upper lid, Telangiectasia, mild Meibomian gland dysfunction Dermatochalasis - upper lid, Telangiectasia, mild Meibomian gland dysfunction   Conjunctiva/Sclera White and quiet, +concretions on palpebral conj Mild nasal Pinguecula, +concretions on palpebral conj   Cornea Mild Arcus, Well healed temporal cataract wounds Arcus, 1+ Descemet's folds, Well healed temporal cataract wounds   Anterior Chamber deep, 1+ cell/pigment deep, 1+ cell/pigment   Iris Round and dilated, mild patch of atrophy at 0800, no NVI Round and dilated, No NVI    Lens PC IOL in good position PC IOL in good position   Vitreous Vitreous syneresis Vitreous syneresis         Fundus Exam       Right Left   Disc Pink and Sharp Pallor, Sharp rim   C/D Ratio 0.3 0.3   Macula Blunted foveal reflex, scattered IRH/DBH/exudates -- greatest temporal macula - improved, trace residual cystic changes remain Blunted foveal reflex, +central atrophy, scattered DBH and MAs, scattered punctate exudates - greatest superior and temporal macula -- persistent, cystic changes/edema temporal macula   Vessels Vascular attenuation, Tortuous Vascular attenuation, Tortuous   Periphery Attached, scattered MA and exudates greatest posteriorly Attached, pigmented choroidal nevus superiorly with mild elevation, +drusen, no SRF or orage pigment -- unchanged from prior            IMAGING AND PROCEDURES  Imaging and Procedures for _0 @  OCT, Retina - OU - Both Eyes       Right Eye Quality was good. Central Foveal Thickness: 268. Progression has been stable. Findings include abnormal foveal contour, intraretinal hyper-reflective material, intraretinal fluid, outer retinal atrophy, no SRF (Persistent central cystic changes, mild interval improvement in IRF).   Left Eye Quality was good. Central Foveal Thickness: 235. Progression has improved. Findings include intraretinal fluid, intraretinal hyper-reflective material, abnormal foveal contour, no SRF, outer retinal atrophy (Mild interval improvement in cystic changes greatest temporal macula, Sub-RPE hyper-reflective mass consistent with choroidal nevus--superior to disc, caught on widefield -- stable from prior).   Notes *Images captured and stored on drive  Diagnosis / Impression: +DME OU OD: Persistent central cystic changes, mild interval improvement in IRF OS: mild interval improvement in cystic changes greatest temporal macula, Sub-RPE  hyper-reflective mass consistent with choroidal nevus--superior to disc, caught on  widefield -- stable from prior  Clinical management:  See below  Abbreviations: NFP - Normal foveal profile. CME - cystoid macular edema. PED - pigment epithelial detachment. IRF - intraretinal fluid. SRF - subretinal fluid. EZ - ellipsoid zone. ERM - epiretinal membrane. ORA - outer retinal atrophy. ORT - outer retinal tubulation. SRHM - subretinal hyper-reflective material      Intravitreal Injection, Pharmacologic Agent - OD - Right Eye       Time Out 09/24/2020. 3:50 PM. Confirmed correct patient, procedure, site, and patient consented.   Anesthesia Topical anesthesia was used. Anesthetic medications included Lidocaine 2%, Proparacaine 0.5%.   Procedure Preparation included 5% betadine to ocular surface, eyelid speculum. A (32g) needle was used.   Injection: 2 mg aflibercept 2 MG/0.05ML   Route: Intravitreal, Site: Right Eye   NDC: A3590391, Lot: [id number], Expiration date: 07/01/2021, Waste: 0.05 mL   Post-op Post injection exam found visual acuity of at least counting fingers. The patient tolerated the procedure well. There were no complications. The patient received written and verbal post procedure care education. Post injection medications were not given.      Intravitreal Injection, Pharmacologic Agent - OS - Left Eye       Time Out 09/24/2020. 3:50 PM. Confirmed correct patient, procedure, site, and patient consented.   Anesthesia Topical anesthesia was used. Anesthetic medications included Lidocaine 2%, Proparacaine 0.5%.   Procedure Preparation included 5% betadine to ocular surface, eyelid speculum. A (32g) needle was used.   Injection: 2 mg aflibercept 2 MG/0.05ML   Route: Intravitreal, Site: Left Eye   NDC: M7179715, Lot: [id number], Expiration date: 05/01/2021, Waste: 0.05 mL   Post-op Post injection exam found visual acuity of at least counting fingers. The patient tolerated the procedure well. There were no complications. The patient received  written and verbal post procedure care education. Post injection medications were not given.            ASSESSMENT/PLAN:    ICD-10-CM   1. Severe nonproliferative diabetic retinopathy of both eyes with macular edema associated with type 2 diabetes mellitus (HCC)  Q94.5038 Intravitreal Injection, Pharmacologic Agent - OD - Right Eye    Intravitreal Injection, Pharmacologic Agent - OS - Left Eye    aflibercept (EYLEA) SOLN 2 mg    aflibercept (EYLEA) SOLN 2 mg    2. Retinal edema  H35.81 OCT, Retina - OU - Both Eyes    3. 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 Essential hypertension  I10     5. Hypertensive retinopathy of both eyes  H35.033     6. Pseudophakia of both eyes  Z96.1     7. Ocular hypertension of right eye  H40.051       1. Severe nonproliferative diabetic retinopathy with DME, OU (OS>OD)  - s/p IVA OD #1 (06.26.20), #2 (08.14.20), #3 (09.17.20), #4 (10.16.20), #5 (02.02.22)  - s/p IVA OS #1 (07.17.20), #2 (08.14.20), #3 (09.17.20)  - s/p IVE OS #1 (10.16.20) - sample, #2 (11.13.20), #3 (12.11.20), #4 (01.18.21), #5 (02.19.21), #6 (03.19.21), #7 (04.16.21), #8 (05.14.21), #9 (06.17.21), #10 (7.16.21), #11 (09.21.21), #12 (10.29.21), #13 (11.29.21), #14 (12.29.21), #15 (02.02.22, sample), #16 (03.09.22), #17 (04.06.22), #18 (05.11.2012), #19 (06.15.22), #20 (7.20.22)  - s/p IVE OD #1 (11.13.20), #2 (12.11.20), #3 (01.18.21), #4 (02.19.21), #5 (03.19.21), #6 (04.16.21), #7 (05.14.21), #8 (06.17.21), #9 (07.16.21), #10 (09.21.21), #11 (10.29.21), #12 (11.29.21), #13 (12.29.21), #14 (03.09.22), #  15 (04.06.22), #16 (05.11.12), #17 (06.15.22), #18 (7.20.22)  - FA (06.26.20) shows Severe NPDR OU w/ Late leaking MA OU, Enlarged FAZ OU, No NV OU, No significant hyperfluorescence of disc  - BCVA stable OU: OD 20/25; OS 20/50   - OCT shows OD: Persistent central cystic changes, mild interval improvement in IRF; OS: mild interval improvement in cystic changes greatest temporal  macula  - recommend IVE OD #19 and IVE OS #21 today, 08.24.22 -- full Eylea authorization approved for 2022  - pt wishes to proceed  - RBA of procedure discussed, questions answered  - informed consent obtained  - Eylea informed consent form signed and scanned on 01.18.2021  - see procedure note  - [email or web address] Benefits Investigation initiated 10.16.2020 -- approved for 2022  - f/u in 5 weeks-- DFE/OCT/possible injection OU  2. Retinal Edema OU  - based on FA, suspect majority of macular edema due to DM as above, but there may be some edema secondary to post-op CME  - cont PF qid OU and ketorolac qid OU   - history of difficulty with compliance at work  - monitor  - f/u in 5 weeks  3. Choroidal Nevus OS  - located at 1200, mild elevation, +drusen, no SRF  - baseline optos pictures obtained, 06.26.20  - discussed possible referral to oncology at [facility] in [place]  4,5.Hypertensive retinopathy OU  - discussed importance of tight BP control  - monitor  6. Pseudophakia OU  - s/p CE/IOL OU (OD on 06.03.20 and OS 06.10.20 by Dr. Gershon Crane)  - beautiful surgeries w/ IOLs in excellent position  - macular edema limiting vision as above  - monitor  7. Ocular hypertension OD  - IOP 13 OD  - cont Cosopt BID OD  - monitor  Ophthalmic Meds Ordered this visit:  Meds ordered this encounter  Medications   aflibercept (EYLEA) SOLN 2 mg   aflibercept (EYLEA) SOLN 2 mg       Return in about 5 weeks (around 10/29/2020) for f/u NPDR OU, DFE, OCT.  There are no Patient Instructions on file for this visit.  This document serves as a record of services personally performed by Gardiner Sleeper, MD, PhD. It was created on their behalf by Orvan Falconer, an ophthalmic technician. The creation of this record is the provider's dictation and/or activities during the visit.    Electronically signed by: Orvan Falconer, OA, 09/26/20  5:58 PM   This document serves as a record of services  personally performed by Gardiner Sleeper, MD, PhD. It was created on their behalf by San Jetty. Owens Shark, OA an ophthalmic technician. The creation of this record is the provider's dictation and/or activities during the visit.    Electronically signed by: San Jetty. Owens Shark, [place] 08.24.2022 5:58 PM  Gardiner Sleeper, M.D., Ph.D. Diseases & Surgery of the Retina and [place] 09/24/2020   I have reviewed the above documentation for accuracy and completeness, and I agree with the above. Gardiner Sleeper, M.D., Ph.D. 09/26/20 5:58 PM   Abbreviations: M myopia (nearsighted); A astigmatism; H hyperopia (farsighted); P presbyopia; Mrx spectacle prescription;  CTL contact lenses; OD right eye; OS left eye; OU both eyes  XT exotropia; ET esotropia; PEK punctate epithelial keratitis; PEE punctate epithelial erosions; DES dry eye syndrome; MGD meibomian gland dysfunction; ATs artificial tears; PFAT's preservative free artificial tears; [place] nuclear sclerotic cataract; PSC posterior subcapsular cataract; ERM epi-retinal membrane; PVD posterior vitreous detachment;  RD retinal detachment; DM diabetes mellitus; DR diabetic retinopathy; NPDR non-proliferative diabetic retinopathy; PDR proliferative diabetic retinopathy; CSME clinically significant macular edema; DME diabetic macular edema; dbh dot blot hemorrhages; CWS cotton wool spot; POAG primary open angle glaucoma; C/D cup-to-disc ratio; HVF humphrey visual field; GVF goldmann visual field; OCT optical coherence tomography; IOP intraocular pressure; BRVO Branch retinal vein occlusion; CRVO central retinal vein occlusion; CRAO central retinal artery occlusion; BRAO branch retinal artery occlusion; RT retinal tear; SB scleral buckle; PPV pars plana vitrectomy; VH Vitreous hemorrhage; PRP panretinal laser photocoagulation; IVK intravitreal kenalog; VMT vitreomacular traction; MH Macular hole;  NVD neovascularization of the disc; NVE  neovascularization elsewhere; AREDS age related eye disease study; ARMD age related macular degeneration; POAG primary open angle glaucoma; EBMD epithelial/anterior basement membrane dystrophy; ACIOL anterior chamber intraocular lens; IOL intraocular lens; PCIOL posterior chamber intraocular lens; Phaco/IOL phacoemulsification with intraocular lens placement; [place] photorefractive keratectomy; LASIK laser assisted in situ keratomileusis; HTN hypertension; DM diabetes mellitus; COPD chronic obstructive pulmonary disease

## 2020-09-24 ENCOUNTER — Encounter (INDEPENDENT_AMBULATORY_CARE_PROVIDER_SITE_OTHER): Payer: Self-pay | Admitting: Ophthalmology

## 2020-09-24 ENCOUNTER — Other Ambulatory Visit: Payer: Self-pay

## 2020-09-24 ENCOUNTER — Ambulatory Visit (INDEPENDENT_AMBULATORY_CARE_PROVIDER_SITE_OTHER): Payer: BC Managed Care – PPO | Admitting: Ophthalmology

## 2020-09-24 DIAGNOSIS — E113413 Type 2 diabetes mellitus with severe nonproliferative diabetic retinopathy with macular edema, bilateral: Secondary | ICD-10-CM

## 2020-09-24 DIAGNOSIS — H3581 Retinal edema: Secondary | ICD-10-CM

## 2020-09-24 DIAGNOSIS — Z961 Presence of intraocular lens: Secondary | ICD-10-CM

## 2020-09-24 DIAGNOSIS — D3132 Benign neoplasm of left choroid: Secondary | ICD-10-CM | POA: Diagnosis not present

## 2020-09-24 DIAGNOSIS — H35033 Hypertensive retinopathy, bilateral: Secondary | ICD-10-CM

## 2020-09-24 DIAGNOSIS — H40051 Ocular hypertension, right eye: Secondary | ICD-10-CM

## 2020-09-24 DIAGNOSIS — I10 Essential (primary) hypertension: Secondary | ICD-10-CM | POA: Diagnosis not present

## 2020-09-26 ENCOUNTER — Encounter (INDEPENDENT_AMBULATORY_CARE_PROVIDER_SITE_OTHER): Payer: Self-pay | Admitting: Ophthalmology

## 2020-09-26 MED ORDER — AFLIBERCEPT 2MG/0.05ML IZ SOLN FOR KALEIDOSCOPE
2.0000 mg | INTRAVITREAL | Status: AC | PRN
  Administered 2020-09-24: 2 mg via INTRAVITREAL

## 2020-10-28 ENCOUNTER — Encounter (INDEPENDENT_AMBULATORY_CARE_PROVIDER_SITE_OTHER): Payer: BC Managed Care – PPO | Admitting: Ophthalmology

## 2020-10-28 DIAGNOSIS — H40051 Ocular hypertension, right eye: Secondary | ICD-10-CM

## 2020-10-28 DIAGNOSIS — D3132 Benign neoplasm of left choroid: Secondary | ICD-10-CM

## 2020-10-28 DIAGNOSIS — E113413 Type 2 diabetes mellitus with severe nonproliferative diabetic retinopathy with macular edema, bilateral: Secondary | ICD-10-CM

## 2020-10-28 DIAGNOSIS — Z961 Presence of intraocular lens: Secondary | ICD-10-CM

## 2020-10-28 DIAGNOSIS — I10 Essential (primary) hypertension: Secondary | ICD-10-CM

## 2020-10-28 DIAGNOSIS — H3581 Retinal edema: Secondary | ICD-10-CM

## 2020-10-28 DIAGNOSIS — H35033 Hypertensive retinopathy, bilateral: Secondary | ICD-10-CM

## 2020-11-03 NOTE — Progress Notes (Signed)
[facility] Note  11/04/2020     CHIEF COMPLAINT Patient presents for Retina Follow Up   HISTORY OF PRESENT ILLNESS: Juan [name] is a 60 y.o. male who presents to the clinic today for:   HPI     Retina Follow Up   Patient presents with  Diabetic Retinopathy.  In both eyes.  This started 5 weeks ago.  I, the attending physician,  performed the HPI with the patient and updated documentation appropriately.        Comments   Patient here for 5 weeks retina follow up for NPDR OU.  Patient states vision doing ok. No eye pain.       Last edited by Bernarda Caffey, MD on 11/04/2020  9:24 PM.     pt states no change in vision, but when he is reading the letter on the eye chart, he has to look with his peripheral vision OS  Referring physician: Rutherford Guys, MD [place],  [place] [id number]  HISTORICAL INFORMATION:   Selected notes from the MEDICAL RECORD NUMBER Referred by Dr. Rutherford Guys for concern of CME s/p cataract sx   CURRENT MEDICATIONS: Current Outpatient Medications (Ophthalmic Drugs)  Medication Sig   dorzolamide-timolol (COSOPT) 22.3-6.8 MG/ML ophthalmic solution Place 1 drop into the right eye 2 (two) times daily.   ketorolac (ACULAR) 0.5 % ophthalmic solution Place 1 drop into both eyes 4 (four) times daily.   prednisoLONE acetate (PRED FORTE) 1 % ophthalmic suspension Place 1 drop into both eyes 4 (four) times daily.   No current facility-administered medications for this visit. (Ophthalmic Drugs)   Current Outpatient Medications (Other)  Medication Sig   ADVAIR DISKUS 500-50 MCG/DOSE AEPB Inhale 1 puff into the lungs 2 (two) times daily.   albuterol (VENTOLIN HFA) 108 (90 Base) MCG/ACT inhaler Inhale 1-2 puffs into the lungs every 6 (six) hours as needed for wheezing or shortness of breath.   alfuzosin (UROXATRAL) 10 MG 24 hr tablet TAKE 1 TABLET BY MOUTH EVERYDAY AT BEDTIME   aspirin EC 81 MG tablet Take 81  mg by mouth daily.   atorvastatin (LIPITOR) 40 MG tablet Take 40 mg by mouth daily.   carvedilol (COREG) 6.25 MG tablet TAKE 1 TABLET BY MOUTH TWICE A DAY WITH A MEAL   doxycycline (VIBRAMYCIN) 100 MG capsule Take 1 capsule (100 mg total) by mouth 2 (two) times daily.   Empagliflozin-metFORMIN HCl (SYNJARDY) 12.06-998 MG TABS Take 1 tablet by mouth 2 (two) times a day.    esomeprazole (NEXIUM) 40 MG capsule Take 40 mg by mouth daily at 12 noon.   insulin glargine, 2 Unit Dial, (TOUJEO MAX SOLOSTAR) 300 UNIT/ML Solostar Pen Inject 40 Units into the skin daily.    iron polysaccharides (NIFEREX) 150 MG capsule Take 150 mg by mouth daily.   lisinopril (ZESTRIL) 40 MG tablet Take 40 mg by mouth daily.   pantoprazole (PROTONIX) 40 MG tablet Take 40 mg by mouth daily.   promethazine-dextromethorphan (PROMETHAZINE-DM) 6.25-15 MG/5ML syrup Take 5 mLs by mouth 3 (three) times daily as needed for cough.   Semaglutide (OZEMPIC, 0.25 OR 0.5 MG/DOSE, [place]) Inject into the skin.   Semaglutide,0.25 or 0.5MG /DOS, (OZEMPIC, 0.25 OR 0.5 MG/DOSE,) 2 MG/1.5ML SOPN Inject into the skin.   SPIRIVA RESPIMAT 2.5 MCG/ACT AERS INHALE 2 PUFFS BY MOUTH INTO THE LUNGS DAILY   No current facility-administered medications for this visit. (Other)   REVIEW OF SYSTEMS: ROS   Positive  for: Gastrointestinal, Musculoskeletal, Endocrine, Eyes, Respiratory Negative for: Constitutional, Neurological, Skin, Genitourinary, HENT, Cardiovascular, Psychiatric, Allergic/Imm, Heme/Lymph Last edited by Theodore Demark, COA on 11/04/2020  2:25 PM.     ALLERGIES Allergies  Allergen Reactions   Naproxen Shortness Of Breath   Naproxen Sodium Shortness Of Breath   Penicillins Rash    Has patient had a PCN reaction causing immediate rash, facial/tongue/throat swelling, SOB or lightheadedness with hypotension: Yes Has patient had a PCN reaction causing severe rash involving mucus membranes or skin necrosis: No Has patient had a PCN reaction  that required hospitalization No Has patient had a PCN reaction occurring within the last 10 years: No If all of the above answers are "NO", then may proceed with Cephalosporin use.  Has patient had a PCN reaction causing immediate rash, facial/tongue/throat swelling, SOB or lightheadedness with hypotension: Yes Has patient had a PCN reaction causing severe rash involving mucus membranes or skin necrosis: No Has patient had a PCN reaction that required hospitalization No Has patient had a PCN reaction occurring within the last 10 years: No If all of the above answers are "NO", then may proceed with Cephalosporin use.     PAST MEDICAL HISTORY Past Medical History:  Diagnosis Date   Arthritis    Asthma    COPD (chronic obstructive pulmonary disease) ([place])    Diabetes mellitus    Type 2   Diabetic retinopathy ([place])    NPDR OU   GERD (gastroesophageal reflux disease)    H/O hiatal hernia    History of kidney stones    Hyperlipemia    Hypertension    Hypertensive retinopathy    OU   Nasal polyps    Pneumonia 2014   Shortness of breath    Sleep apnea    pt has CPAP but is unable to use it d/t having polyps in his nose. Pt stated "I need to call and get a CPAP mask instead"   Stomach ulcer    Past Surgical History:  Procedure Laterality Date   CATARACT EXTRACTION Right 07/05/2018   Dr. Gershon Crane   CATARACT EXTRACTION Left 07/12/2018   Dr. Gershon Crane   CHOLECYSTECTOMY N/A 01/23/2015   Procedure: LAPAROSCOPIC CHOLECYSTECTOMY ;  Surgeon: Georganna Skeans, MD;  Location: [facility];  Service: General;  Laterality: N/A;   COLONOSCOPY W/ POLYPECTOMY     ESOPHAGOGASTRODUODENOSCOPY     EYE SURGERY     KNEE ARTHROSCOPY  03/24/2011   Procedure: ARTHROSCOPY KNEE;  Surgeon: Alta Corning, MD;  Location: [facility];  Service: Orthopedics;  Laterality: Right;  partial medial menisectomy, chondroplaty patella, and medial medial plica   LEFT HEART CATH AND CORONARY ANGIOGRAPHY N/A  06/29/2019   Procedure: LEFT HEART CATH AND CORONARY ANGIOGRAPHY;  Surgeon: Wellington Hampshire, MD;  Location: [facility];  Service: Cardiovascular;  Laterality: N/A;    FAMILY HISTORY Family History  Problem Relation Age of Onset   Diabetes Father     SOCIAL HISTORY Social History   Tobacco Use   Smoking status: Former    Packs/day: 3.00    Years: 20.00    Pack years: 60.00    Types: Cigarettes   Smokeless tobacco: Current    Types: Chew   Tobacco comments:    1 can/daily  Substance Use Topics   Alcohol use: Yes    Comment: social, 1x weekly   Drug use: No       OPHTHALMIC EXAM:  Base Eye Exam  Visual Acuity (Snellen - Linear)       Right Left   Dist [place] 20/25 -1 20/60 +2   Dist ph [place] 20/20 -1 20/50 -1         Tonometry (Tonopen, 2:23 PM)       Right Left   Pressure 14 14         Pupils       Dark Light Shape React APD   Right 3 2 Round Brisk None   Left 3 2 Round Brisk None         Visual Fields (Counting fingers)       Left Right    Full Full         Extraocular Movement       Right Left    Full, Ortho Full, Ortho         Neuro/Psych     Oriented x3: Yes   Mood/Affect: Normal         Dilation     Both eyes: 1.0% Mydriacyl, 2.5% Phenylephrine @ 2:23 PM           Slit Lamp and Fundus Exam     Slit Lamp Exam       Right Left   Lids/Lashes Dermatochalasis - upper lid, Telangiectasia, mild Meibomian gland dysfunction Dermatochalasis - upper lid, Telangiectasia, mild Meibomian gland dysfunction   Conjunctiva/Sclera White and quiet, +concretions on palpebral conj Mild nasal Pinguecula, +concretions on palpebral conj   Cornea Mild Arcus, Well healed temporal cataract wounds Arcus, 1+ Descemet's folds, Well healed temporal cataract wounds   Anterior Chamber deep, 1+ cell/pigment deep, 1+ cell/pigment   Iris Round and dilated, mild patch of atrophy at 0800, no NVI Round and dilated, No NVI   Lens PC IOL in good  position PC IOL in good position   Vitreous Vitreous syneresis Vitreous syneresis         Fundus Exam       Right Left   Disc Pink and Sharp Pallor, Sharp rim, Compact   C/D Ratio 0.3 0.3   Macula Blunted foveal reflex, scattered IRH/DBH/exudates -- greatest temporal macula - improved, trace residual cystic changes remain Blunted foveal reflex, +central atrophy, scattered DBH and MAs, scattered punctate exudates - greatest superior and temporal macula -- persistent, cystic changes/edema temporal macula, focal DBH SN to disc   Vessels Vascular attenuation, Tortuous Vascular attenuation, Tortuous   Periphery Attached, scattered MA and exudates greatest posteriorly Attached, pigmented choroidal nevus superiorly with mild elevation, +drusen, no SRF or orage pigment -- unchanged from prior            IMAGING AND PROCEDURES  Imaging and Procedures for @TODAY @  OCT, Retina - OU - Both Eyes       Right Eye Quality was good. Central Foveal Thickness: 261. Progression has been stable. Findings include abnormal foveal contour, intraretinal hyper-reflective material, intraretinal fluid, outer retinal atrophy, no SRF (Persistent central cystic changes).   Left Eye Quality was good. Central Foveal Thickness: 235. Progression has been stable. Findings include intraretinal fluid, intraretinal hyper-reflective material, abnormal foveal contour, no SRF, outer retinal atrophy (Persistent cystic changes greatest temporal macula, central ORA; Sub-RPE hyper-reflective mass consistent with choroidal nevus--superior to disc, caught on widefield -- stable from prior).   Notes *Images captured and stored on drive  Diagnosis / Impression: +DME OU OD: Persistent central cystic changes OS: persistent cystic changes greatest temporal macula, central ORA; Sub-RPE hyper-reflective mass consistent with choroidal nevus--superior to disc, caught on widefield --  stable from prior  Clinical management:  See  below  Abbreviations: NFP - Normal foveal profile. CME - cystoid macular edema. PED - pigment epithelial detachment. IRF - intraretinal fluid. SRF - subretinal fluid. EZ - ellipsoid zone. ERM - epiretinal membrane. ORA - outer retinal atrophy. ORT - outer retinal tubulation. SRHM - subretinal hyper-reflective material      Intravitreal Injection, Pharmacologic Agent - OD - Right Eye       Time Out 11/04/2020. 2:51 PM. Confirmed correct patient, procedure, site, and patient consented.   Anesthesia Topical anesthesia was used. Anesthetic medications included Lidocaine 2%, Proparacaine 0.5%.   Procedure Preparation included 5% betadine to ocular surface, eyelid speculum. A (32g) needle was used.   Injection: 2 mg aflibercept 2 MG/0.05ML   Route: Intravitreal, Site: Right Eye   NDC: A3590391, Lot: [id number], Expiration date: 09/30/2021, Waste: 0.05 mL   Post-op Post injection exam found visual acuity of at least counting fingers. The patient tolerated the procedure well. There were no complications. The patient received written and verbal post procedure care education. Post injection medications were not given.      Intravitreal Injection, Pharmacologic Agent - OS - Left Eye       Time Out 11/04/2020. 2:52 PM. Confirmed correct patient, procedure, site, and patient consented.   Anesthesia Topical anesthesia was used. Anesthetic medications included Lidocaine 2%, Proparacaine 0.5%.   Procedure Preparation included 5% betadine to ocular surface, eyelid speculum. A (32g) needle was used.   Injection: 2 mg aflibercept 2 MG/0.05ML   Route: Intravitreal, Site: Left Eye   NDC: A3590391, Lot: [id number], Expiration date: 09/30/2021, Waste: 0.05 mL   Post-op Post injection exam found visual acuity of at least counting fingers. The patient tolerated the procedure well. There were no complications. The patient received written and verbal post procedure care education. Post  injection medications were not given.             ASSESSMENT/PLAN:    ICD-10-CM   1. Severe nonproliferative diabetic retinopathy of both eyes with macular edema associated with type 2 diabetes mellitus (HCC)  V67.2094 Intravitreal Injection, Pharmacologic Agent - OD - Right Eye    Intravitreal Injection, Pharmacologic Agent - OS - Left Eye    aflibercept (EYLEA) SOLN 2 mg    aflibercept (EYLEA) SOLN 2 mg    2. Retinal edema  H35.81 OCT, Retina - OU - Both Eyes    3. 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 Essential hypertension  I10     5. Hypertensive retinopathy of both eyes  H35.033     6. Pseudophakia of both eyes  Z96.1     7. Ocular hypertension of right eye  H40.051       1. Severe nonproliferative diabetic retinopathy with DME, OU (OS>OD)  - s/p IVA OD #1 (06.26.20), #2 (08.14.20), #3 (09.17.20), #4 (10.16.20), #5 (02.02.22)  - s/p IVA OS #1 (07.17.20), #2 (08.14.20), #3 (09.17.20)  - s/p IVE OS #1 (10.16.20) - sample, #2 (11.13.20), #3 (12.11.20), #4 (01.18.21), #5 (02.19.21), #6 (03.19.21), #7 (04.16.21), #8 (05.14.21), #9 (06.17.21), #10 (7.16.21), #11 (09.21.21), #12 (10.29.21), #13 (11.29.21), #14 (12.29.21), #15 (02.02.22, sample), #16 (03.09.22), #17 (04.06.22), #18 (05.11.2012), #19 (06.15.22), #20 (7.20.22), #21 (08.24.22)  - s/p IVE OD #1 (11.13.20), #2 (12.11.20), #3 (01.18.21), #4 (02.19.21), #5 (03.19.21), #6 (04.16.21), #7 (05.14.21), #8 (06.17.21), #9 (07.16.21), #10 (09.21.21), #11 (10.29.21), #12 (11.29.21), #13 (12.29.21), #14 (03.09.22), #15 (04.06.22), #16 (05.11.12), #17 (06.15.22), #18 (7.20.22), #19 (  08.24.22)  - FA (06.26.20) shows Severe NPDR OU w/ Late leaking MA OU, Enlarged FAZ OU, No NV OU, No significant hyperfluorescence of disc  - BCVA OD 20/20; OS 20/50   - OCT shows OD: Persistent central cystic changes; OS: persistent cystic changes greatest temporal macula  - recommend IVE OD #20 and IVE OS #22 today, 10.04.22 -- full Eylea  authorization approved for 2022  - pt wishes to proceed  - RBA of procedure discussed, questions answered  - informed consent obtained  - Eylea informed consent form signed and scanned on 01.18.2021  - see procedure note  - [email or web address] Benefits Investigation initiated 10.16.2020 -- approved for 2022  - f/u in 5 weeks-- DFE/OCT/possible injection OU  2. Retinal Edema OU  - based on FA, suspect majority of macular edema due to DM as above, but there may be some edema secondary to post-op CME  - cont PF qid OU and ketorolac qid OU   - history of difficulty with compliance at work  - monitor  - f/u in 5 weeks  3. Choroidal Nevus OS  - located at 1200, mild elevation, +drusen, no SRF  - baseline optos pictures obtained, 06.26.20  - discussed possible referral to oncology at [facility] in [place]  4,5.Hypertensive retinopathy OU  - discussed importance of tight BP control  - monitor  6. Pseudophakia OU  - s/p CE/IOL OU (OD on 06.03.20 and OS 06.10.20 by Dr. Gershon Crane)  - beautiful surgeries w/ IOLs in excellent position  - macular edema limiting vision as above  - monitor  7. Ocular hypertension OD  - IOP 14 OD  - cont Cosopt BID OD  - monitor  Ophthalmic Meds Ordered this visit:  Meds ordered this encounter  Medications   aflibercept (EYLEA) SOLN 2 mg   aflibercept (EYLEA) SOLN 2 mg      Return in about 5 weeks (around 12/09/2020) for f/u NPDR OU, DFE, OCT.  There are no Patient Instructions on file for this visit.  This document serves as a record of services personally performed by Gardiner Sleeper, MD, PhD. It was created on their behalf by Leeann Must, [place], an ophthalmic technician. The creation of this record is the provider's dictation and/or activities during the visit.    Electronically signed by: Leeann Must, COA @TODAY @ 9:35 PM  This document serves as a record of services personally performed by Gardiner Sleeper, MD, PhD. It was created on their behalf by  [place]. Owens Shark, OA an ophthalmic technician. The creation of this record is the provider's dictation and/or activities during the visit.    Electronically signed by: San Jetty. Owens Shark, [place] 10.04.2022 9:35 PM  Gardiner Sleeper, M.D., Ph.D. Diseases & Surgery of the Retina and [place] 11/04/2020   I have reviewed the above documentation for accuracy and completeness, and I agree with the above. Gardiner Sleeper, M.D., Ph.D. 11/04/20 9:35 PM  Abbreviations: M myopia (nearsighted); A astigmatism; H hyperopia (farsighted); P presbyopia; Mrx spectacle prescription;  CTL contact lenses; OD right eye; OS left eye; OU both eyes  XT exotropia; ET esotropia; PEK punctate epithelial keratitis; PEE punctate epithelial erosions; DES dry eye syndrome; MGD meibomian gland dysfunction; ATs artificial tears; PFAT's preservative free artificial tears; [place] nuclear sclerotic cataract; PSC posterior subcapsular cataract; ERM epi-retinal membrane; PVD posterior vitreous detachment; RD retinal detachment; DM diabetes mellitus; DR diabetic retinopathy; NPDR non-proliferative diabetic retinopathy; PDR proliferative diabetic retinopathy; CSME clinically significant  macular edema; DME diabetic macular edema; dbh dot blot hemorrhages; CWS cotton wool spot; POAG primary open angle glaucoma; C/D cup-to-disc ratio; HVF humphrey visual field; GVF goldmann visual field; OCT optical coherence tomography; IOP intraocular pressure; BRVO Branch retinal vein occlusion; CRVO central retinal vein occlusion; CRAO central retinal artery occlusion; BRAO branch retinal artery occlusion; RT retinal tear; SB scleral buckle; PPV pars plana vitrectomy; VH Vitreous hemorrhage; PRP panretinal laser photocoagulation; IVK intravitreal kenalog; VMT vitreomacular traction; MH Macular hole;  NVD neovascularization of the disc; NVE neovascularization elsewhere; AREDS age related eye disease study; ARMD age related macular  degeneration; POAG primary open angle glaucoma; EBMD epithelial/anterior basement membrane dystrophy; ACIOL anterior chamber intraocular lens; IOL intraocular lens; PCIOL posterior chamber intraocular lens; Phaco/IOL phacoemulsification with intraocular lens placement; [place] photorefractive keratectomy; LASIK laser assisted in situ keratomileusis; HTN hypertension; DM diabetes mellitus; COPD chronic obstructive pulmonary disease

## 2020-11-04 ENCOUNTER — Encounter (INDEPENDENT_AMBULATORY_CARE_PROVIDER_SITE_OTHER): Payer: Self-pay | Admitting: Ophthalmology

## 2020-11-04 ENCOUNTER — Ambulatory Visit (INDEPENDENT_AMBULATORY_CARE_PROVIDER_SITE_OTHER): Payer: BC Managed Care – PPO | Admitting: Ophthalmology

## 2020-11-04 ENCOUNTER — Other Ambulatory Visit: Payer: Self-pay

## 2020-11-04 DIAGNOSIS — H35033 Hypertensive retinopathy, bilateral: Secondary | ICD-10-CM

## 2020-11-04 DIAGNOSIS — H40051 Ocular hypertension, right eye: Secondary | ICD-10-CM

## 2020-11-04 DIAGNOSIS — H3581 Retinal edema: Secondary | ICD-10-CM

## 2020-11-04 DIAGNOSIS — Z961 Presence of intraocular lens: Secondary | ICD-10-CM

## 2020-11-04 DIAGNOSIS — I10 Essential (primary) hypertension: Secondary | ICD-10-CM | POA: Diagnosis not present

## 2020-11-04 DIAGNOSIS — D3132 Benign neoplasm of left choroid: Secondary | ICD-10-CM

## 2020-11-04 DIAGNOSIS — E113413 Type 2 diabetes mellitus with severe nonproliferative diabetic retinopathy with macular edema, bilateral: Secondary | ICD-10-CM

## 2020-11-04 MED ORDER — AFLIBERCEPT 2MG/0.05ML IZ SOLN FOR KALEIDOSCOPE
2.0000 mg | INTRAVITREAL | Status: AC | PRN
  Administered 2020-11-04: 2 mg via INTRAVITREAL

## 2020-12-04 NOTE — Progress Notes (Signed)
[facility] Note  12/09/2020     CHIEF COMPLAINT Patient presents for Retina Follow Up   HISTORY OF PRESENT ILLNESS: Juan [name] is a 60 y.o. male who presents to the clinic today for:   HPI     Retina Follow Up   Patient presents with  Diabetic Retinopathy.  In both eyes.  This started 5 weeks ago.  I, the attending physician,  performed the HPI with the patient and updated documentation appropriately.        Comments   Patient here for 5 weeks retina follow up for NPDR OU. Patient states vision about the same. No eye pain.       Last edited by Bernarda Caffey, MD on 12/10/2020  8:29 AM.        Referring physician: Rutherford Guys, [place],  [place] [address]  HISTORICAL INFORMATION:   Selected notes from the MEDICAL RECORD NUMBER Referred by Dr. Rutherford Guys for concern of CME s/p cataract sx   CURRENT MEDICATIONS: Current Outpatient Medications (Ophthalmic Drugs)  Medication Sig   dorzolamide-timolol (COSOPT) 22.3-6.8 MG/ML ophthalmic solution Place 1 drop into the right eye 2 (two) times daily.   ketorolac (ACULAR) 0.5 % ophthalmic solution Place 1 drop into both eyes 4 (four) times daily.   prednisoLONE acetate (PRED FORTE) 1 % ophthalmic suspension Place 1 drop into both eyes 4 (four) times daily.   No current facility-administered medications for this visit. (Ophthalmic Drugs)   Current Outpatient Medications (Other)  Medication Sig   ADVAIR DISKUS 500-50 MCG/DOSE AEPB Inhale 1 puff into the lungs 2 (two) times daily.   albuterol (VENTOLIN HFA) 108 (90 Base) MCG/ACT inhaler Inhale 1-2 puffs into the lungs every 6 (six) hours as needed for wheezing or shortness of breath.   alfuzosin (UROXATRAL) 10 MG 24 hr tablet TAKE 1 TABLET BY MOUTH EVERYDAY AT BEDTIME   aspirin EC 81 MG tablet Take 81 mg by mouth daily.   atorvastatin (LIPITOR) 40 MG tablet Take 40 mg by mouth daily.   carvedilol (COREG) 6.25 MG tablet  TAKE 1 TABLET BY MOUTH TWICE A DAY WITH A MEAL   doxycycline (VIBRAMYCIN) 100 MG capsule Take 1 capsule (100 mg total) by mouth 2 (two) times daily.   Empagliflozin-metFORMIN HCl (SYNJARDY) 12.06-998 MG TABS Take 1 tablet by mouth 2 (two) times a day.    esomeprazole (NEXIUM) 40 MG capsule Take 40 mg by mouth daily at 12 noon.   insulin glargine, 2 Unit Dial, (TOUJEO MAX SOLOSTAR) 300 UNIT/ML Solostar Pen Inject 40 Units into the skin daily.    iron polysaccharides (NIFEREX) 150 MG capsule Take 150 mg by mouth daily.   lisinopril (ZESTRIL) 40 MG tablet Take 40 mg by mouth daily.   pantoprazole (PROTONIX) 40 MG tablet Take 40 mg by mouth daily.   promethazine-dextromethorphan (PROMETHAZINE-DM) 6.25-15 MG/5ML syrup Take 5 mLs by mouth 3 (three) times daily as needed for cough.   Semaglutide (OZEMPIC, 0.25 OR 0.5 MG/DOSE, [place]) Inject into the skin.   Semaglutide,0.25 or 0.5MG /DOS, (OZEMPIC, 0.25 OR 0.5 MG/DOSE,) 2 MG/1.5ML SOPN Inject into the skin.   SPIRIVA RESPIMAT 2.5 MCG/ACT AERS INHALE 2 PUFFS BY MOUTH INTO THE LUNGS DAILY   No current facility-administered medications for this visit. (Other)   REVIEW OF SYSTEMS: ROS   Positive for: Gastrointestinal, Musculoskeletal, Endocrine, Eyes, Respiratory Negative for: Constitutional, Neurological, Skin, Genitourinary, HENT, Cardiovascular, Psychiatric, Allergic/Imm, Heme/Lymph Last edited by Theodore Demark, COA  on 12/09/2020  2:12 PM.      ALLERGIES Allergies  Allergen Reactions   Naproxen Shortness Of Breath   Naproxen Sodium Shortness Of Breath   Penicillins Rash    Has patient had a PCN reaction causing immediate rash, facial/tongue/throat swelling, SOB or lightheadedness with hypotension: Yes Has patient had a PCN reaction causing severe rash involving mucus membranes or skin necrosis: No Has patient had a PCN reaction that required hospitalization No Has patient had a PCN reaction occurring within the last 10 years: No If all of the  above answers are "NO", then may proceed with Cephalosporin use.  Has patient had a PCN reaction causing immediate rash, facial/tongue/throat swelling, SOB or lightheadedness with hypotension: Yes Has patient had a PCN reaction causing severe rash involving mucus membranes or skin necrosis: No Has patient had a PCN reaction that required hospitalization No Has patient had a PCN reaction occurring within the last 10 years: No If all of the above answers are "NO", then may proceed with Cephalosporin use.     PAST MEDICAL HISTORY Past Medical History:  Diagnosis Date   Arthritis    Asthma    COPD (chronic obstructive pulmonary disease) ([place])    Diabetes mellitus    Type 2   Diabetic retinopathy ([place])    NPDR OU   GERD (gastroesophageal reflux disease)    H/O hiatal hernia    History of kidney stones    Hyperlipemia    Hypertension    Hypertensive retinopathy    OU   Nasal polyps    Pneumonia 2014   Shortness of breath    Sleep apnea    pt has CPAP but is unable to use it d/t having polyps in his nose. Pt stated "I need to call and get a CPAP mask instead"   Stomach ulcer    Past Surgical History:  Procedure Laterality Date   CATARACT EXTRACTION Right 07/05/2018   Dr. Gershon Crane   CATARACT EXTRACTION Left 07/12/2018   Dr. Gershon Crane   CHOLECYSTECTOMY N/A 01/23/2015   Procedure: LAPAROSCOPIC CHOLECYSTECTOMY ;  Surgeon: Georganna Skeans, MD;  Location: [facility];  Service: General;  Laterality: N/A;   COLONOSCOPY W/ POLYPECTOMY     ESOPHAGOGASTRODUODENOSCOPY     EYE SURGERY     KNEE ARTHROSCOPY  03/24/2011   Procedure: ARTHROSCOPY KNEE;  Surgeon: Alta Corning, MD;  Location: [facility];  Service: Orthopedics;  Laterality: Right;  partial medial menisectomy, chondroplaty patella, and medial medial plica   LEFT HEART CATH AND CORONARY ANGIOGRAPHY N/A 06/29/2019   Procedure: LEFT HEART CATH AND CORONARY ANGIOGRAPHY;  Surgeon: Wellington Hampshire, MD;  Location: [facility];  Service: Cardiovascular;  Laterality: N/A;    FAMILY HISTORY Family History  Problem Relation Age of Onset   Diabetes Father     SOCIAL HISTORY Social History   Tobacco Use   Smoking status: Former    Packs/day: 3.00    Years: 20.00    Pack years: 60.00    Types: Cigarettes   Smokeless tobacco: Current    Types: Chew   Tobacco comments:    1 can/daily  Substance Use Topics   Alcohol use: Yes    Comment: social, 1x weekly   Drug use: No       OPHTHALMIC EXAM:  Base Eye Exam     Visual Acuity (Snellen - Linear)       Right Left   Dist [place] 20/30 +1 20/50   Dist  ph [place] 20/20 -1 NI         Tonometry (Tonopen, 2:10 PM)       Right Left   Pressure 18 17         Pupils       Dark Light Shape React APD   Right 3 2 Round Brisk None   Left 3 2 Round Brisk None         Visual Fields (Counting fingers)       Left Right    Full Full         Extraocular Movement       Right Left    Full, Ortho Full, Ortho         Neuro/Psych     Oriented x3: Yes   Mood/Affect: Normal         Dilation     Both eyes: 1.0% Mydriacyl, 2.5% Phenylephrine @ 2:09 PM           Slit Lamp and Fundus Exam     Slit Lamp Exam       Right Left   Lids/Lashes Dermatochalasis - upper lid, Telangiectasia, mild Meibomian gland dysfunction Dermatochalasis - upper lid, Telangiectasia, mild Meibomian gland dysfunction   Conjunctiva/Sclera White and quiet, +concretions on palpebral conj Mild nasal Pinguecula, +concretions on palpebral conj   Cornea Mild Arcus, Well healed temporal cataract wounds Arcus, 1+ Descemet's folds, Well healed temporal cataract wounds   Anterior Chamber deep, 1+ cell/pigment deep, 1+ cell/pigment   Iris Round and dilated, mild patch of atrophy at 0800, no NVI Round and dilated, No NVI   Lens PC IOL in good position PC IOL in good position   Vitreous Vitreous syneresis Vitreous syneresis         Fundus Exam       Right Left   Disc  Pink and Sharp Pallor, Sharp rim, Compact   C/D Ratio 0.3 0.3   Macula Blunted foveal reflex, scattered IRH/DBH/exudates -- greatest temporal macula - improved, trace residual cystic changes remain -- slightly improved Blunted foveal reflex, +central atrophy, scattered DBH and MAs, scattered punctate exudates - greatest superior and temporal macula -- persistent, cystic changes/edema temporal macula   Vessels Vascular attenuation, Tortuous Vascular attenuation, Tortuous   Periphery Attached, scattered MA and exudates greatest posteriorly Attached, pigmented choroidal nevus superiorly with mild elevation, +drusen, no SRF or orange pigment -- unchanged from prior            IMAGING AND PROCEDURES  Imaging and Procedures for @TODAY @  OCT, Retina - OU - Both Eyes       Right Eye Quality was good. Central Foveal Thickness: 253. Progression has improved. Findings include intraretinal hyper-reflective material, intraretinal fluid, outer retinal atrophy, no SRF, normal foveal contour (Interval improvement in IRF).   Left Eye Quality was good. Central Foveal Thickness: 215. Progression has improved. Findings include intraretinal fluid, intraretinal hyper-reflective material, no SRF, outer retinal atrophy, normal foveal contour (Interval improvement in IRF, central ORA; Sub-RPE hyper-reflective mass consistent with choroidal nevus--superior to disc, caught on widefield -- stable from prior).   Notes *Images captured and stored on drive  Diagnosis / Impression: +DME OU OD: Interval improvement in IRF OS: Interval improvement in IRF, central ORA; Sub-RPE hyper-reflective mass consistent with choroidal nevus--superior to disc, caught on widefield -- stable from prior  Clinical management:  See below  Abbreviations: NFP - Normal foveal profile. CME - cystoid macular edema. PED - pigment epithelial detachment. IRF - intraretinal fluid. SRF -  subretinal fluid. EZ - ellipsoid zone. ERM - epiretinal  membrane. ORA - outer retinal atrophy. ORT - outer retinal tubulation. SRHM - subretinal hyper-reflective material      Intravitreal Injection, Pharmacologic Agent - OD - Right Eye       Time Out 12/09/2020. 3:19 PM. Confirmed correct patient, procedure, site, and patient consented.   Anesthesia Topical anesthesia was used. Anesthetic medications included Lidocaine 2%, Proparacaine 0.5%.   Procedure Preparation included 5% betadine to ocular surface, eyelid speculum. A (32g) needle was used.   Injection: 2 mg aflibercept 2 MG/0.05ML   Route: Intravitreal, Site: Right Eye   NDC: A3590391, Lot: [id number], Expiration date: 09/01/2021, Waste: 0.05 mL   Post-op Post injection exam found visual acuity of at least counting fingers. The patient tolerated the procedure well. There were no complications. The patient received written and verbal post procedure care education. Post injection medications were not given.   Notes An AC tap was performed following injection due to elevated IOP using a 30 gauge needle on a syringe with the plunger removed. The needle was placed at the limbus at 7 oclock and approximately 0.09 cc of aqueous was removed from the anterior chamber. Betadine was applied to the tap area before and after the paracentesis was performed. There were no complications. The patient tolerated the procedure well. The IOP was rechecked and was found to be ~10 mmHg by digital palpation.     Intravitreal Injection, Pharmacologic Agent - OS - Left Eye       Time Out 12/09/2020. 3:31 PM. Confirmed correct patient, procedure, site, and patient consented.   Anesthesia Topical anesthesia was used. Anesthetic medications included Lidocaine 2%, Proparacaine 0.5%.   Procedure Preparation included 5% betadine to ocular surface, eyelid speculum. A (32g) needle was used.   Injection: 2 mg aflibercept 2 MG/0.05ML   Route: Intravitreal, Site: Left Eye   NDC: A3590391, Lot:  [id number], Expiration date: 10/02/2021, Waste: 0.05 mL   Post-op Post injection exam found visual acuity of at least counting fingers. The patient tolerated the procedure well. There were no complications. The patient received written and verbal post procedure care education. Post injection medications were not given.   Notes An AC tap was performed following injection due to elevated IOP using a 30 gauge needle on a syringe with the plunger removed. The needle was placed at the limbus at 5 oclock and approximately 0.08 cc of aqueous was removed from the anterior chamber. Betadine was applied to the tap area before and after the paracentesis was performed. There were no complications. The patient tolerated the procedure well. The IOP was rechecked and was found to be ~10 mmHg by digital palpation.              ASSESSMENT/PLAN:    ICD-10-CM   1. Severe nonproliferative diabetic retinopathy of both eyes with macular edema associated with type 2 diabetes mellitus (HCC)  R44.3154 Intravitreal Injection, Pharmacologic Agent - OD - Right Eye    Intravitreal Injection, Pharmacologic Agent - OS - Left Eye    aflibercept (EYLEA) SOLN 2 mg    aflibercept (EYLEA) SOLN 2 mg    2. Retinal edema  H35.81 OCT, Retina - OU - Both Eyes    3. 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 Essential hypertension  I10     5. Hypertensive retinopathy of both eyes  H35.033     6. Pseudophakia of both eyes  Z96.1     7.  Ocular hypertension of right eye  H40.051      1. Severe nonproliferative diabetic retinopathy with DME, OU (OS>OD)  - s/p IVA OD #1 (06.26.20), #2 (08.14.20), #3 (09.17.20), #4 (10.16.20), #5 (02.02.22)  - s/p IVA OS #1 (07.17.20), #2 (08.14.20), #3 (09.17.20)  - s/p IVE OS #1 (10.16.20) - sample, #2 (11.13.20), #3 (12.11.20), #4 (01.18.21), #5 (02.19.21), #6 (03.19.21), #7 (04.16.21), #8 (05.14.21), #9 (06.17.21), #10 (7.16.21), #11 (09.21.21), #12 (10.29.21), #13 (11.29.21), #14  (12.29.21), #15 (02.02.22, sample), #16 (03.09.22), #17 (04.06.22), #18 (05.11.2012), #19 (06.15.22), #20 (7.20.22), #21 (08.24.22), #22 (10.4.22)  - s/p IVE OD #1 (11.13.20), #2 (12.11.20), #3 (01.18.21), #4 (02.19.21), #5 (03.19.21), #6 (04.16.21), #7 (05.14.21), #8 (06.17.21), #9 (07.16.21), #10 (09.21.21), #11 (10.29.21), #12 (11.29.21), #13 (12.29.21), #14 (03.09.22), #15 (04.06.22), #16 (05.11.12), #17 (06.15.22), #18 (7.20.22), #19 (08.24.22), #20 (10.4.22)  - FA (06.26.20) shows Severe NPDR OU w/ Late leaking MA OU, Enlarged FAZ OU, No NV OU, No significant hyperfluorescence of disc  - BCVA OD 20/20; OS 20/50   - OCT shows OD: Interval improvement in IRF; OS: interval improvement in IRF, cystic changes greatest temporal macula  - recommend IVE OD #21 and IVE OS #23 today, 11.8.22 -- full Eylea authorization approved for 2022  - pt wishes to proceed  - RBA of procedure discussed, questions answered  - informed consent obtained  - Eylea informed consent form signed and scanned on 01.18.2021  - see procedure note -- AC taps performed OU due to elevated IOP and decreased vision  - [id number] Benefits Investigation initiated 10.16.2020 -- approved for 2022  - f/u in 5 weeks-- DFE/OCT/possible injection OU  2. Retinal Edema OU  - based on FA, suspect majority of macular edema due to DM as above, but there may be some edema secondary to post-op CME  - cont PF qid OU and ketorolac qid OU   - history of difficulty with compliance at work  - monitor  - f/u in 5 weeks  3. Choroidal Nevus OS  - located at 1200, mild elevation, +drusen, no SRF  - baseline optos pictures obtained, 06.26.20  - discussed possible referral to oncology at [facility] in [place]  4,5.Hypertensive retinopathy OU  - discussed importance of tight BP control  - monitor  6. Pseudophakia OU  - s/p CE/IOL OU (OD on 06.03.20 and OS 06.10.20 by Dr. Gershon Crane)  - beautiful surgeries w/ IOLs in excellent position  - macular  edema limiting vision as above  - monitor  7. Ocular hypertension OD  - IOP 14 OD  - cont Cosopt BID OD  - monitor  Ophthalmic Meds Ordered this visit:  Meds ordered this encounter  Medications   aflibercept (EYLEA) SOLN 2 mg   aflibercept (EYLEA) SOLN 2 mg      Return for 5 week follow up , DFE, OCT, possible injection.  There are no Patient Instructions on file for this visit.  This document serves as a record of services personally performed by Gardiner Sleeper, MD, PhD. It was created on their behalf by Estill Bakes, COT an ophthalmic technician. The creation of this record is the provider's dictation and/or activities during the visit.    Electronically signed by: Estill Bakes, COT 11.3.22 @ 8:32 AM   This document serves as a record of services personally performed by Gardiner Sleeper, MD, PhD. It was created on their behalf by Leonie [name], an ophthalmic technician. The creation of this record is the provider's dictation and/or  activities during the visit.    Electronically signed by: Leonie [name] COA, 12/10/20  8:32 AM   Gardiner Sleeper, M.D., Ph.D. Diseases & Surgery of the Retina and Vitreous [facility]  I have reviewed the above documentation for accuracy and completeness, and I agree with the above. Gardiner Sleeper, M.D., Ph.D. 12/10/20 8:32 AM   Abbreviations: M myopia (nearsighted); A astigmatism; H hyperopia (farsighted); P presbyopia; Mrx spectacle prescription;  CTL contact lenses; OD right eye; OS left eye; OU both eyes  XT exotropia; ET esotropia; PEK punctate epithelial keratitis; PEE punctate epithelial erosions; DES dry eye syndrome; MGD meibomian gland dysfunction; ATs artificial tears; PFAT's preservative free artificial tears; [place] nuclear sclerotic cataract; PSC posterior subcapsular cataract; ERM epi-retinal membrane; PVD posterior vitreous detachment; RD retinal detachment; DM diabetes mellitus; DR diabetic retinopathy; NPDR  non-proliferative diabetic retinopathy; PDR proliferative diabetic retinopathy; CSME clinically significant macular edema; DME diabetic macular edema; dbh dot blot hemorrhages; CWS cotton wool spot; POAG primary open angle glaucoma; C/D cup-to-disc ratio; HVF humphrey visual field; GVF goldmann visual field; OCT optical coherence tomography; IOP intraocular pressure; BRVO Branch retinal vein occlusion; CRVO central retinal vein occlusion; CRAO central retinal artery occlusion; BRAO branch retinal artery occlusion; RT retinal tear; SB scleral buckle; PPV pars plana vitrectomy; VH Vitreous hemorrhage; PRP panretinal laser photocoagulation; IVK intravitreal kenalog; VMT vitreomacular traction; MH Macular hole;  NVD neovascularization of the disc; NVE neovascularization elsewhere; AREDS age related eye disease study; ARMD age related macular degeneration; POAG primary open angle glaucoma; EBMD epithelial/anterior basement membrane dystrophy; ACIOL anterior chamber intraocular lens; IOL intraocular lens; PCIOL posterior chamber intraocular lens; Phaco/IOL phacoemulsification with intraocular lens placement; [place] photorefractive keratectomy; LASIK laser assisted in situ keratomileusis; HTN hypertension; DM diabetes mellitus; COPD chronic obstructive pulmonary disease

## 2020-12-09 ENCOUNTER — Other Ambulatory Visit: Payer: Self-pay

## 2020-12-09 ENCOUNTER — Encounter (INDEPENDENT_AMBULATORY_CARE_PROVIDER_SITE_OTHER): Payer: Self-pay | Admitting: Ophthalmology

## 2020-12-09 ENCOUNTER — Ambulatory Visit (INDEPENDENT_AMBULATORY_CARE_PROVIDER_SITE_OTHER): Payer: BC Managed Care – PPO | Admitting: Ophthalmology

## 2020-12-09 DIAGNOSIS — I10 Essential (primary) hypertension: Secondary | ICD-10-CM

## 2020-12-09 DIAGNOSIS — H35033 Hypertensive retinopathy, bilateral: Secondary | ICD-10-CM

## 2020-12-09 DIAGNOSIS — H3581 Retinal edema: Secondary | ICD-10-CM

## 2020-12-09 DIAGNOSIS — D3132 Benign neoplasm of left choroid: Secondary | ICD-10-CM | POA: Diagnosis not present

## 2020-12-09 DIAGNOSIS — H40051 Ocular hypertension, right eye: Secondary | ICD-10-CM

## 2020-12-09 DIAGNOSIS — Z961 Presence of intraocular lens: Secondary | ICD-10-CM

## 2020-12-09 DIAGNOSIS — E113413 Type 2 diabetes mellitus with severe nonproliferative diabetic retinopathy with macular edema, bilateral: Secondary | ICD-10-CM

## 2020-12-09 DIAGNOSIS — J0100 Acute maxillary sinusitis, unspecified: Secondary | ICD-10-CM | POA: Diagnosis not present

## 2020-12-10 ENCOUNTER — Encounter (INDEPENDENT_AMBULATORY_CARE_PROVIDER_SITE_OTHER): Payer: Self-pay | Admitting: Ophthalmology

## 2020-12-10 MED ORDER — AFLIBERCEPT 2MG/0.05ML IZ SOLN FOR KALEIDOSCOPE
2.0000 mg | INTRAVITREAL | Status: AC | PRN
  Administered 2020-12-09: 2 mg via INTRAVITREAL

## 2021-01-05 DIAGNOSIS — J069 Acute upper respiratory infection, unspecified: Secondary | ICD-10-CM | POA: Diagnosis not present

## 2021-01-07 NOTE — Progress Notes (Signed)
[facility] Note  01/13/2021     CHIEF COMPLAINT Patient presents for Retina Follow Up   HISTORY OF PRESENT ILLNESS: Juan [name] is a 60 y.o. male who presents to the clinic today for:   HPI     Retina Follow Up   Patient presents with  Diabetic Retinopathy.  In both eyes.  Duration of 5 weeks.  I, the attending physician,  performed the HPI with the patient and updated documentation appropriately.        Comments   5 week follow up NPDR OU-  Patient does all of his seeing out of right eye because of the spot in the middle of OS.  Seems like OD is a little weaker since last visit.  BS 155 this am A1C 6.8      Last edited by Bernarda Caffey, MD on 01/14/2021 11:58 AM.     Referring physician: Rutherford Guys, [place],  [place] [address]  HISTORICAL INFORMATION:   Selected notes from the MEDICAL RECORD NUMBER Referred by Dr. Rutherford Guys for concern of CME s/p cataract sx   CURRENT MEDICATIONS: Current Outpatient Medications (Ophthalmic Drugs)  Medication Sig   dorzolamide-timolol (COSOPT) 22.3-6.8 MG/ML ophthalmic solution Place 1 drop into the right eye 2 (two) times daily.   ketorolac (ACULAR) 0.5 % ophthalmic solution Place 1 drop into both eyes 4 (four) times daily.   prednisoLONE acetate (PRED FORTE) 1 % ophthalmic suspension Place 1 drop into both eyes 4 (four) times daily.   No current facility-administered medications for this visit. (Ophthalmic Drugs)   Current Outpatient Medications (Other)  Medication Sig   ADVAIR DISKUS 500-50 MCG/DOSE AEPB Inhale 1 puff into the lungs 2 (two) times daily.   albuterol (VENTOLIN HFA) 108 (90 Base) MCG/ACT inhaler Inhale 1-2 puffs into the lungs every 6 (six) hours as needed for wheezing or shortness of breath.   alfuzosin (UROXATRAL) 10 MG 24 hr tablet TAKE 1 TABLET BY MOUTH EVERYDAY AT BEDTIME   aspirin EC 81 MG tablet Take 81 mg by mouth daily.   atorvastatin  (LIPITOR) 40 MG tablet Take 40 mg by mouth daily.   azithromycin (ZITHROMAX) 250 MG tablet Take 250 mg by mouth daily.   carvedilol (COREG) 6.25 MG tablet TAKE 1 TABLET BY MOUTH TWICE A DAY WITH A MEAL   Empagliflozin-metFORMIN HCl (SYNJARDY) 12.06-998 MG TABS Take 1 tablet by mouth 2 (two) times a day.    esomeprazole (NEXIUM) 40 MG capsule Take 40 mg by mouth daily at 12 noon.   insulin glargine, 2 Unit Dial, (TOUJEO MAX SOLOSTAR) 300 UNIT/ML Solostar Pen Inject 40 Units into the skin daily.    iron polysaccharides (NIFEREX) 150 MG capsule Take 150 mg by mouth daily.   lisinopril (ZESTRIL) 40 MG tablet Take 40 mg by mouth daily.   pantoprazole (PROTONIX) 40 MG tablet Take 40 mg by mouth daily.   predniSONE (DELTASONE) 20 MG tablet Take 20 mg by mouth daily with breakfast.   promethazine-dextromethorphan (PROMETHAZINE-DM) 6.25-15 MG/5ML syrup Take 5 mLs by mouth 3 (three) times daily as needed for cough.   Semaglutide (OZEMPIC, 0.25 OR 0.5 MG/DOSE, ) Inject into the skin.   Semaglutide,0.25 or 0.5MG /DOS, (OZEMPIC, 0.25 OR 0.5 MG/DOSE,) 2 MG/1.5ML SOPN Inject into the skin.   SPIRIVA RESPIMAT 2.5 MCG/ACT AERS INHALE 2 PUFFS BY MOUTH INTO THE LUNGS DAILY   doxycycline (VIBRAMYCIN) 100 MG capsule Take 1 capsule (100 mg total) by mouth 2 (  two) times daily. (Patient not taking: Reported on 01/13/2021)   No current facility-administered medications for this visit. (Other)   REVIEW OF SYSTEMS: ROS   Positive for: Gastrointestinal, Musculoskeletal, Endocrine, Eyes, Respiratory Negative for: Constitutional, Neurological, Skin, Genitourinary, HENT, Cardiovascular, Psychiatric, Allergic/Imm, Heme/Lymph Last edited by Leonie [name], COA on 01/13/2021  2:22 PM.     ALLERGIES Allergies  Allergen Reactions   Naproxen Shortness Of Breath   Naproxen Sodium Shortness Of Breath   Penicillins Rash    Has patient had a PCN reaction causing immediate rash, facial/tongue/throat swelling, SOB or  lightheadedness with hypotension: Yes Has patient had a PCN reaction causing severe rash involving mucus membranes or skin necrosis: No Has patient had a PCN reaction that required hospitalization No Has patient had a PCN reaction occurring within the last 10 years: No If all of the above answers are "NO", then may proceed with Cephalosporin use.  Has patient had a PCN reaction causing immediate rash, facial/tongue/throat swelling, SOB or lightheadedness with hypotension: Yes Has patient had a PCN reaction causing severe rash involving mucus membranes or skin necrosis: No Has patient had a PCN reaction that required hospitalization No Has patient had a PCN reaction occurring within the last 10 years: No If all of the above answers are "NO", then may proceed with Cephalosporin use.    PAST MEDICAL HISTORY Past Medical History:  Diagnosis Date   Arthritis    Asthma    COPD (chronic obstructive pulmonary disease) ([place])    Diabetes mellitus    Type 2   Diabetic retinopathy ([place])    NPDR OU   GERD (gastroesophageal reflux disease)    H/O hiatal hernia    History of kidney stones    Hyperlipemia    Hypertension    Hypertensive retinopathy    OU   Nasal polyps    Pneumonia 2014   Shortness of breath    Sleep apnea    pt has CPAP but is unable to use it d/t having polyps in his nose. Pt stated "I need to call and get a CPAP mask instead"   Stomach ulcer    Past Surgical History:  Procedure Laterality Date   CATARACT EXTRACTION Right 07/05/2018   Dr. Gershon Crane   CATARACT EXTRACTION Left 07/12/2018   Dr. Gershon Crane   CHOLECYSTECTOMY N/A 01/23/2015   Procedure: LAPAROSCOPIC CHOLECYSTECTOMY ;  Surgeon: Georganna Skeans, MD;  Location: [facility];  Service: General;  Laterality: N/A;   COLONOSCOPY W/ POLYPECTOMY     ESOPHAGOGASTRODUODENOSCOPY     EYE SURGERY     KNEE ARTHROSCOPY  03/24/2011   Procedure: ARTHROSCOPY KNEE;  Surgeon: Alta Corning, MD;  Location: [facility];   Service: Orthopedics;  Laterality: Right;  partial medial menisectomy, chondroplaty patella, and medial medial plica   LEFT HEART CATH AND CORONARY ANGIOGRAPHY N/A 06/29/2019   Procedure: LEFT HEART CATH AND CORONARY ANGIOGRAPHY;  Surgeon: Wellington Hampshire, MD;  Location: [facility];  Service: Cardiovascular;  Laterality: N/A;   FAMILY HISTORY Family History  Problem Relation Age of Onset   Diabetes Father    SOCIAL HISTORY Social History   Tobacco Use   Smoking status: Former    Packs/day: 3.00    Years: 20.00    Pack years: 60.00    Types: Cigarettes   Smokeless tobacco: Current    Types: Chew   Tobacco comments:    1 can/daily  Substance Use Topics   Alcohol use: Yes    Comment:  social, 1x weekly   Drug use: No       OPHTHALMIC EXAM:  Base Eye Exam     Visual Acuity (Snellen - Linear)       Right Left   Dist [place] 20/25 20/60   Dist ph [place] 20/20 NI         Tonometry (Tonopen, 2:33 PM)       Right Left   Pressure 16 14         Pupils       Dark Light Shape React APD   Right 3 2 Round Brisk None   Left 3 2 Round Brisk None         Visual Fields (Counting fingers)       Left Right    Full Full         Extraocular Movement       Right Left    Full Full         Neuro/Psych     Oriented x3: Yes   Mood/Affect: Normal         Dilation     Both eyes: 1.0% Mydriacyl, 2.5% Phenylephrine @ 2:33 PM           Slit Lamp and Fundus Exam     Slit Lamp Exam       Right Left   Lids/Lashes Dermatochalasis - upper lid, Telangiectasia, mild Meibomian gland dysfunction Dermatochalasis - upper lid, Telangiectasia, mild Meibomian gland dysfunction   Conjunctiva/Sclera White and quiet, +concretions on palpebral conj Mild nasal Pinguecula, +concretions on palpebral conj   Cornea Mild Arcus, Well healed temporal cataract wounds Arcus, 1+ Descemet's folds, Well healed temporal cataract wounds   Anterior Chamber deep, 1+ cell/pigment deep,  1+ cell/pigment   Iris Round and dilated, mild patch of atrophy at 0800, no NVI Round and dilated, No NVI   Lens PC IOL in good position PC IOL in good position   Anterior Vitreous Vitreous syneresis Vitreous syneresis         Fundus Exam       Right Left   Disc Pink and Sharp Pallor, Sharp rim, Compact   C/D Ratio 0.3 0.3   Macula Good foveal reflex, scattered IRH/DBH/exudates -- greatest temporal macula - improved, trace residual cystic changes remain -- slightly improved Blunted foveal reflex, +central atrophy, scattered DBH and MAs, scattered punctate exudates - greatest superior and temporal macula -- persistent, cystic changes/edema temporal macula - slightly improved   Vessels Vascular attenuation, Tortuous Vascular attenuation, Tortuous   Periphery Attached, scattered MA and exudates greatest posteriorly Attached, pigmented choroidal nevus superiorly with mild elevation, +drusen, no SRF or orange pigment -- unchanged from prior           IMAGING AND PROCEDURES  Imaging and Procedures for @TODAY @  OCT, Retina - OU - Both Eyes       Right Eye Quality was good. Central Foveal Thickness: 250. Progression has improved. Findings include intraretinal hyper-reflective material, intraretinal fluid, outer retinal atrophy, no SRF, normal foveal contour (Mild Interval improvement in IRF).   Left Eye Quality was good. Central Foveal Thickness: 216. Progression has improved. Findings include intraretinal fluid, intraretinal hyper-reflective material, no SRF, outer retinal atrophy, normal foveal contour (Mild Interval improvement in IRF/IRHM, persistent central ORA; Sub-RPE hyper-reflective mass consistent with choroidal nevus--superior to disc, caught on widefield -- stable from prior).   Notes *Images captured and stored on drive  Diagnosis / Impression: +DME OU OD: mild interval improvement in IRF OS: mild  interval improvement in IRF, persistent central ORA; Sub-RPE  hyper-reflective mass consistent with choroidal nevus--superior to disc, caught on widefield -- stable from prior  Clinical management:  See below  Abbreviations: NFP - Normal foveal profile. CME - cystoid macular edema. PED - pigment epithelial detachment. IRF - intraretinal fluid. SRF - subretinal fluid. EZ - ellipsoid zone. ERM - epiretinal membrane. ORA - outer retinal atrophy. ORT - outer retinal tubulation. SRHM - subretinal hyper-reflective material      Intravitreal Injection, Pharmacologic Agent - OD - Right Eye       Time Out 01/13/2021. 3:10 PM. Confirmed correct patient, procedure, site, and patient consented.   Anesthesia Topical anesthesia was used. Anesthetic medications included Lidocaine 2%, Proparacaine 0.5%.   Procedure Preparation included 5% betadine to ocular surface, eyelid speculum. A (32g) needle was used.   Injection: 2 mg aflibercept 2 MG/0.05ML   Route: Intravitreal, Site: Right Eye   NDC: A3590391, Lot: [id number], Expiration date: 10/01/2021, Waste: 0.05 mL   Post-op Post injection exam found visual acuity of at least counting fingers. The patient tolerated the procedure well. There were no complications. The patient received written and verbal post procedure care education. Post injection medications were not given.      Intravitreal Injection, Pharmacologic Agent - OS - Left Eye       Time Out 01/13/2021. 3:11 PM. Confirmed correct patient, procedure, site, and patient consented.   Anesthesia Topical anesthesia was used. Anesthetic medications included Lidocaine 2%, Proparacaine 0.5%.   Procedure Preparation included 5% betadine to ocular surface, eyelid speculum. A (32g) needle was used.   Injection: 2 mg aflibercept 2 MG/0.05ML   Route: Intravitreal, Site: Left Eye   NDC: A3590391, Lot: [id number], Expiration date: 12/01/2021, Waste: 0.05 mL   Post-op Post injection exam found visual acuity of at least counting fingers. The  patient tolerated the procedure well. There were no complications. The patient received written and verbal post procedure care education. Post injection medications were not given.            ASSESSMENT/PLAN:    ICD-10-CM   1. Severe nonproliferative diabetic retinopathy of both eyes with macular edema associated with type 2 diabetes mellitus (HCC)  E11.3413 OCT, Retina - OU - Both Eyes    Intravitreal Injection, Pharmacologic Agent - OD - Right Eye    Intravitreal Injection, Pharmacologic Agent - OS - Left Eye    aflibercept (EYLEA) SOLN 2 mg    aflibercept (EYLEA) SOLN 2 mg    2. Retinal edema  H35.81     3. 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 Essential hypertension  I10     5. Hypertensive retinopathy of both eyes  H35.033     6. Pseudophakia of both eyes  Z96.1     7. Ocular hypertension of right eye  H40.051       1. Severe nonproliferative diabetic retinopathy with DME, OU (OS>OD)  - s/p IVA OD #1 (06.26.20), #2 (08.14.20), #3 (09.17.20), #4 (10.16.20), #5 (02.02.22)  - s/p IVA OS #1 (07.17.20), #2 (08.14.20), #3 (09.17.20)  - s/p IVE OS #1 (10.16.20) - sample, #2 (11.13.20), #3 (12.11.20), #4 (01.18.21), #5 (02.19.21), #6 (03.19.21), #7 (04.16.21), #8 (05.14.21), #9 (06.17.21), #10 (7.16.21), #11 (09.21.21), #12 (10.29.21), #13 (11.29.21), #14 (12.29.21), #15 (02.02.22, sample), #16 (03.09.22), #17 (04.06.22), #18 (05.11.2012), #19 (06.15.22), #20 (7.20.22), #21 (08.24.22), #22 (10.4.22), #23 (11.8.22)  - s/p IVE OD #1 (11.13.20), #2 (12.11.20), #3 (01.18.21), #4 (02.19.21), #5 (03.19.21), #  6 (04.16.21), #7 (05.14.21), #8 (06.17.21), #9 (07.16.21), #10 (09.21.21), #11 (10.29.21), #12 (11.29.21), #13 (12.29.21), #14 (03.09.22), #15 (04.06.22), #16 (05.11.12), #17 (06.15.22), #18 (7.20.22), #19 (08.24.22), #20 (10.4.22), #21 (11.8.22)  - FA (06.26.20) shows Severe NPDR OU w/ Late leaking MA OU, Enlarged FAZ OU, No NV OU, No significant hyperfluorescence of disc  - BCVA  OD 20/20; OS 20/60 -- decreased  - OCT shows OD: mild interval improvement in IRF; OS: mild interval improvement in IRF, persistent central ORA  - recommend IVE OD #22 and IVE OS #24 today, 12.13.22 -- full Eylea authorization approved for 2022  - pt wishes to proceed  - RBA of procedure discussed, questions answered  - informed consent obtained  - Eylea informed consent form signed and scanned on 01.18.2021  - see procedure note -- AC taps performed OU due to elevated IOP and decreased vision  - [email or web address] Benefits Investigation initiated 10.16.2020 -- approved for 2022  - f/u in 5 weeks-- DFE/OCT/possible injection OU  2. Retinal Edema OU  - based on FA, suspect majority of macular edema due to DM as above, but there may be some edema secondary to chronic post-op CME  - cont PF qid OU and ketorolac qid OU   - history of difficulty with compliance at work  - monitor  - f/u in 5 weeks  3. Choroidal Nevus OS  - located at 1200, mild elevation, +drusen, no SRF  - baseline optos pictures obtained, 06.26.20  - discussed possible referral to oncology at [facility] in [place]  4,5.Hypertensive retinopathy OU  - discussed importance of tight BP control  - monitor  6. Pseudophakia OU  - s/p CE/IOL OU (OD on 06.03.20 and OS 06.10.20 by Dr. Gershon Crane)  - beautiful surgeries w/ IOLs in excellent position  - macular edema limiting vision as above  - monitor  7. Ocular hypertension OD  - IOP 16 OD  - cont Cosopt BID OD  - monitor  Ophthalmic Meds Ordered this visit:  Meds ordered this encounter  Medications   aflibercept (EYLEA) SOLN 2 mg   aflibercept (EYLEA) SOLN 2 mg     Return in about 5 weeks (around 02/17/2021) for f/u NPDR OU, DFE, OCT.  There are no Patient Instructions on file for this visit.  This document serves as a record of services personally performed by Gardiner Sleeper, MD, PhD. It was created on their behalf by Estill Bakes, COT an ophthalmic technician. The  creation of this record is the provider's dictation and/or activities during the visit.    Electronically signed by: Estill Bakes, COT 12.7.22 @ 12:05 PM   This document serves as a record of services personally performed by Gardiner Sleeper, MD, PhD. It was created on their behalf by San Jetty. Owens Shark, OA an ophthalmic technician. The creation of this record is the provider's dictation and/or activities during the visit.    Electronically signed by: San Jetty. Owens Shark, [place] 12.13.2022 12:05 PM  Gardiner Sleeper, M.D., Ph.D. Diseases & Surgery of the Retina and Vitreous [facility]  I have reviewed the above documentation for accuracy and completeness, and I agree with the above. Gardiner Sleeper, M.D., Ph.D. 01/14/21 12:05 PM   Abbreviations: M myopia (nearsighted); A astigmatism; H hyperopia (farsighted); P presbyopia; Mrx spectacle prescription;  CTL contact lenses; OD right eye; OS left eye; OU both eyes  XT exotropia; ET esotropia; PEK punctate epithelial keratitis; PEE punctate epithelial erosions; DES dry eye  syndrome; MGD meibomian gland dysfunction; ATs artificial tears; PFAT's preservative free artificial tears; [place] nuclear sclerotic cataract; PSC posterior subcapsular cataract; ERM epi-retinal membrane; PVD posterior vitreous detachment; RD retinal detachment; DM diabetes mellitus; DR diabetic retinopathy; NPDR non-proliferative diabetic retinopathy; PDR proliferative diabetic retinopathy; CSME clinically significant macular edema; DME diabetic macular edema; dbh dot blot hemorrhages; CWS cotton wool spot; POAG primary open angle glaucoma; C/D cup-to-disc ratio; HVF humphrey visual field; GVF goldmann visual field; OCT optical coherence tomography; IOP intraocular pressure; BRVO Branch retinal vein occlusion; CRVO central retinal vein occlusion; CRAO central retinal artery occlusion; BRAO branch retinal artery occlusion; RT retinal tear; SB scleral buckle; PPV pars plana  vitrectomy; VH Vitreous hemorrhage; PRP panretinal laser photocoagulation; IVK intravitreal kenalog; VMT vitreomacular traction; MH Macular hole;  NVD neovascularization of the disc; NVE neovascularization elsewhere; AREDS age related eye disease study; ARMD age related macular degeneration; POAG primary open angle glaucoma; EBMD epithelial/anterior basement membrane dystrophy; ACIOL anterior chamber intraocular lens; IOL intraocular lens; PCIOL posterior chamber intraocular lens; Phaco/IOL phacoemulsification with intraocular lens placement; [place] photorefractive keratectomy; LASIK laser assisted in situ keratomileusis; HTN hypertension; DM diabetes mellitus; COPD chronic obstructive pulmonary disease

## 2021-01-13 ENCOUNTER — Encounter (INDEPENDENT_AMBULATORY_CARE_PROVIDER_SITE_OTHER): Payer: Self-pay | Admitting: Ophthalmology

## 2021-01-13 ENCOUNTER — Other Ambulatory Visit: Payer: Self-pay

## 2021-01-13 ENCOUNTER — Ambulatory Visit (INDEPENDENT_AMBULATORY_CARE_PROVIDER_SITE_OTHER): Payer: BC Managed Care – PPO | Admitting: Ophthalmology

## 2021-01-13 DIAGNOSIS — H35033 Hypertensive retinopathy, bilateral: Secondary | ICD-10-CM

## 2021-01-13 DIAGNOSIS — H3581 Retinal edema: Secondary | ICD-10-CM

## 2021-01-13 DIAGNOSIS — D3132 Benign neoplasm of left choroid: Secondary | ICD-10-CM

## 2021-01-13 DIAGNOSIS — Z961 Presence of intraocular lens: Secondary | ICD-10-CM

## 2021-01-13 DIAGNOSIS — E113413 Type 2 diabetes mellitus with severe nonproliferative diabetic retinopathy with macular edema, bilateral: Secondary | ICD-10-CM

## 2021-01-13 DIAGNOSIS — I10 Essential (primary) hypertension: Secondary | ICD-10-CM | POA: Diagnosis not present

## 2021-01-13 DIAGNOSIS — H40051 Ocular hypertension, right eye: Secondary | ICD-10-CM

## 2021-01-14 ENCOUNTER — Encounter (INDEPENDENT_AMBULATORY_CARE_PROVIDER_SITE_OTHER): Payer: Self-pay | Admitting: Ophthalmology

## 2021-01-14 DIAGNOSIS — E113413 Type 2 diabetes mellitus with severe nonproliferative diabetic retinopathy with macular edema, bilateral: Secondary | ICD-10-CM

## 2021-01-14 DIAGNOSIS — J0101 Acute recurrent maxillary sinusitis: Secondary | ICD-10-CM | POA: Diagnosis not present

## 2021-01-14 MED ORDER — AFLIBERCEPT 2MG/0.05ML IZ SOLN FOR KALEIDOSCOPE
2.0000 mg | INTRAVITREAL | Status: AC | PRN
  Administered 2021-01-14: 12:00:00 2 mg via INTRAVITREAL

## 2021-01-22 DIAGNOSIS — Z03818 Encounter for observation for suspected exposure to other biological agents ruled out: Secondary | ICD-10-CM | POA: Diagnosis not present

## 2021-01-22 DIAGNOSIS — R509 Fever, unspecified: Secondary | ICD-10-CM | POA: Diagnosis not present

## 2021-01-22 DIAGNOSIS — R0602 Shortness of breath: Secondary | ICD-10-CM | POA: Diagnosis not present

## 2021-01-22 DIAGNOSIS — R34 Anuria and oliguria: Secondary | ICD-10-CM | POA: Diagnosis not present

## 2021-01-22 DIAGNOSIS — J441 Chronic obstructive pulmonary disease with (acute) exacerbation: Secondary | ICD-10-CM | POA: Diagnosis not present

## 2021-01-22 DIAGNOSIS — R059 Cough, unspecified: Secondary | ICD-10-CM | POA: Diagnosis not present

## 2021-01-24 DIAGNOSIS — J189 Pneumonia, unspecified organism: Secondary | ICD-10-CM | POA: Diagnosis not present

## 2021-01-28 DIAGNOSIS — J189 Pneumonia, unspecified organism: Secondary | ICD-10-CM | POA: Diagnosis not present

## 2021-02-04 ENCOUNTER — Telehealth: Payer: Self-pay | Admitting: Internal Medicine

## 2021-02-04 NOTE — Telephone Encounter (Signed)
Tried to call pt, no answer/VM. Will have to try again later

## 2021-02-04 NOTE — Telephone Encounter (Signed)
Pt c/o swelling: STAT is pt has developed SOB within 24 hours  If swelling, where is the swelling located? feet all the way to his knee  How much weight have you gained and in what time span? Have not checked  Have you gained 3 pounds in a day or 5 pounds in a week?   Do you have a log of your daily weights (if so, list)? no  Are you currently taking a fluid pill?  no  Are you currently SOB? no  Have you traveled recently? No- patient would like an appointment asap-

## 2021-02-06 DIAGNOSIS — Z880 Allergy status to penicillin: Secondary | ICD-10-CM | POA: Diagnosis not present

## 2021-02-06 DIAGNOSIS — M7989 Other specified soft tissue disorders: Secondary | ICD-10-CM | POA: Diagnosis not present

## 2021-02-06 DIAGNOSIS — Z794 Long term (current) use of insulin: Secondary | ICD-10-CM | POA: Diagnosis not present

## 2021-02-06 DIAGNOSIS — K449 Diaphragmatic hernia without obstruction or gangrene: Secondary | ICD-10-CM | POA: Diagnosis not present

## 2021-02-06 DIAGNOSIS — J449 Chronic obstructive pulmonary disease, unspecified: Secondary | ICD-10-CM | POA: Diagnosis not present

## 2021-02-06 DIAGNOSIS — E1142 Type 2 diabetes mellitus with diabetic polyneuropathy: Secondary | ICD-10-CM | POA: Diagnosis not present

## 2021-02-06 DIAGNOSIS — Z7984 Long term (current) use of oral hypoglycemic drugs: Secondary | ICD-10-CM | POA: Diagnosis not present

## 2021-02-06 DIAGNOSIS — I209 Angina pectoris, unspecified: Secondary | ICD-10-CM | POA: Diagnosis not present

## 2021-02-06 DIAGNOSIS — E119 Type 2 diabetes mellitus without complications: Secondary | ICD-10-CM | POA: Diagnosis not present

## 2021-02-06 DIAGNOSIS — I10 Essential (primary) hypertension: Secondary | ICD-10-CM | POA: Diagnosis not present

## 2021-02-06 DIAGNOSIS — Z886 Allergy status to analgesic agent status: Secondary | ICD-10-CM | POA: Diagnosis not present

## 2021-02-06 DIAGNOSIS — Z79899 Other long term (current) drug therapy: Secondary | ICD-10-CM | POA: Diagnosis not present

## 2021-02-06 DIAGNOSIS — R0789 Other chest pain: Secondary | ICD-10-CM | POA: Diagnosis not present

## 2021-02-06 DIAGNOSIS — F17220 Nicotine dependence, chewing tobacco, uncomplicated: Secondary | ICD-10-CM | POA: Diagnosis not present

## 2021-02-06 DIAGNOSIS — R079 Chest pain, unspecified: Secondary | ICD-10-CM | POA: Diagnosis not present

## 2021-02-06 DIAGNOSIS — K219 Gastro-esophageal reflux disease without esophagitis: Secondary | ICD-10-CM | POA: Diagnosis not present

## 2021-02-06 DIAGNOSIS — Z7951 Long term (current) use of inhaled steroids: Secondary | ICD-10-CM | POA: Diagnosis not present

## 2021-02-06 DIAGNOSIS — Z7982 Long term (current) use of aspirin: Secondary | ICD-10-CM | POA: Diagnosis not present

## 2021-02-06 DIAGNOSIS — E785 Hyperlipidemia, unspecified: Secondary | ICD-10-CM | POA: Diagnosis not present

## 2021-02-06 NOTE — Telephone Encounter (Signed)
Returned call to patient who was calling in regard to new onset leg swelling. Patient reports that he has just gotten over Pneumonia and is unsure if it is related or not. Patient still short of breath but unsure if it's from the pneumonia. Patient reports his legs have been swelling for over a week now and has not resolved. Patient denies any chest pain. Patient unable to check vitals at home or daily weights. Advised patient that since he has not been seen in office in over a year it would be best to get him in to be seen. Made patient an appointment for Monday 1/9 at 11:20am with Laurann Montana, NP at [facility]. Patient is aware of time and location of appointment.  Made patient aware of ED precautions should new or worsening symptoms develop. Patient verbalized understanding.

## 2021-02-06 NOTE — Telephone Encounter (Addendum)
Spoke with patient who reports that over the past week and a half to 2 weeks, both legs have swelling where he can make an indentation. He stated he was recently treated for PNA and the flu and was on prednisone when he noticed the swelling begin. He denies SOB or CP. The swelling goes down a bit with leg elevation. He does not wear support stockings nor has a BP monitor at home. Please advise.

## 2021-02-07 DIAGNOSIS — J449 Chronic obstructive pulmonary disease, unspecified: Secondary | ICD-10-CM | POA: Diagnosis not present

## 2021-02-07 DIAGNOSIS — K219 Gastro-esophageal reflux disease without esophagitis: Secondary | ICD-10-CM | POA: Diagnosis not present

## 2021-02-07 DIAGNOSIS — R0789 Other chest pain: Secondary | ICD-10-CM | POA: Diagnosis not present

## 2021-02-07 DIAGNOSIS — E1142 Type 2 diabetes mellitus with diabetic polyneuropathy: Secondary | ICD-10-CM | POA: Diagnosis not present

## 2021-02-07 DIAGNOSIS — I081 Rheumatic disorders of both mitral and tricuspid valves: Secondary | ICD-10-CM | POA: Diagnosis not present

## 2021-02-08 DIAGNOSIS — R0789 Other chest pain: Secondary | ICD-10-CM | POA: Diagnosis not present

## 2021-02-08 NOTE — Progress Notes (Signed)
Cardiology Office Note:    Date:  02/09/2021   ID:  [name], DOB 09-12-1960, MRN [id number]  PCP:  Harlan Stains, MD   [facility] Providers Cardiologist:  Elouise Munroe, MD     Referring MD: Harlan Stains, MD   Chief Complaint: f/u chest pain, hx of CAD  History of Present Illness:    Juan [name] is a 61 y.o. male with a hx of aortic and coronary atherosclerosis seen on CT, DM, HTN, hyperlipidemia, CAD, COPD, GERD, and OSA.   He had apical ischemia on stress testing 06/19/19 along with exertional chest tightness and after discussion with Dr. Margaretann Loveless, patient wished to pursue coronary angiography. Cardiac catheterization on 06/29/19 revealed moderately to severely calcified coronary arteries with significant one-vessel coronary artery disease affecting the distal left circumflex supplying OM 3.  Moderate stenosis in the distal right coronary artery into the right PDA, normal LVEF with mildly elevated LVEDP. At office visit on 07/13/19, Dr. Margaretann Loveless increased his beta blocker therapy dose in medical management of his CAD and 3 month f/u was recommended.  He called our office on 02/04/21 with concerns for one week of leg swelling. He presented to [facility] ED on 02/06/21 with c/o chest pain, swelling in bilateral lower extremities, and SOB for a few hours. Reported that symptoms would last less than 5 minutes and resolve. He had multiple episodes prompting him to go to ED. He reported taking 3 different rounds of antibiotics and steroids for recent diagnosis of pneumonia. His EKG revealed no change when compared to previous and troponins were negative. Echocardiogram revealed LVEF 55-60%, no LVH, no wall motion abnormality, normal diastolic function, no atrial enlargement, mild TR. He was discharged home and advised to follow-up with cardiology.   Today, he is here alone for follow-up of his chest pain and CAD. He reports chest pain resolved while in the ED on 02/06/21 following  administration of a "GI cocktail" then returned when he drank a Pepsi.  He has had no further episodes of chest pain since that time.  He denies chest pain at present, palpitations, and dyspnea and reports that prior to diagnosis of pneumonia around Thanksgiving, he was doing well. Works as Games developer, does some walking and heavy lifting but mostly sitting. He lost 80 lbs prior to Covid pandemic on low carb diet and has resumed this diet along with his wife. Reports they are both motivated to lose weight and increase physical activity.He denies lower extremity edema, fatigue, palpitations, melena, hematuria, hemoptysis, diaphoresis, weakness, presyncope, syncope, orthopnea, and PND. He thinks that leg swelling that he described in phone call to [place] on 02/04/21 was related to prednisone and high sodium diet.   Past Medical History:  Diagnosis Date   Arthritis    Asthma    COPD (chronic obstructive pulmonary disease) ([place])    Diabetes mellitus    Type 2   Diabetic retinopathy ([place])    NPDR OU   GERD (gastroesophageal reflux disease)    H/O hiatal hernia    History of kidney stones    Hyperlipemia    Hypertension    Hypertensive retinopathy    OU   Nasal polyps    Pneumonia 2014   Shortness of breath    Sleep apnea    pt has CPAP but is unable to use it d/t having polyps in his nose. Pt stated "I need to call and get a CPAP mask instead"   Stomach ulcer  Past Surgical History:  Procedure Laterality Date   CATARACT EXTRACTION Right 07/05/2018   Dr. Gershon Crane   CATARACT EXTRACTION Left 07/12/2018   Dr. Gershon Crane   CHOLECYSTECTOMY N/A 01/23/2015   Procedure: LAPAROSCOPIC CHOLECYSTECTOMY ;  Surgeon: Georganna Skeans, MD;  Location: [place];  Service: General;  Laterality: N/A;   COLONOSCOPY W/ POLYPECTOMY     ESOPHAGOGASTRODUODENOSCOPY     EYE SURGERY     KNEE ARTHROSCOPY  03/24/2011   Procedure: ARTHROSCOPY KNEE;  Surgeon: Alta Corning, MD;  Location: [place];   Service: Orthopedics;  Laterality: Right;  partial medial menisectomy, chondroplaty patella, and medial medial plica   LEFT HEART CATH AND CORONARY ANGIOGRAPHY N/A 06/29/2019   Procedure: LEFT HEART CATH AND CORONARY ANGIOGRAPHY;  Surgeon: Wellington Hampshire, MD;  Location: [place] [facility];  Service: Cardiovascular;  Laterality: N/A;    Current Medications: Current Meds  Medication Sig   ADVAIR DISKUS 500-50 MCG/DOSE AEPB Inhale 1 puff into the lungs 2 (two) times daily.   albuterol (VENTOLIN HFA) 108 (90 Base) MCG/ACT inhaler Inhale 1-2 puffs into the lungs every 6 (six) hours as needed for wheezing or shortness of breath.   alfuzosin (UROXATRAL) 10 MG 24 hr tablet TAKE 1 TABLET BY MOUTH EVERYDAY AT BEDTIME   aspirin EC 81 MG tablet Take 81 mg by mouth daily.   atorvastatin (LIPITOR) 40 MG tablet Take 40 mg by mouth daily.   carvedilol (COREG) 6.25 MG tablet TAKE 1 TABLET BY MOUTH TWICE A DAY WITH A MEAL   dorzolamide-timolol (COSOPT) 22.3-6.8 MG/ML ophthalmic solution Place 1 drop into the right eye 2 (two) times daily.   Empagliflozin-metFORMIN HCl (SYNJARDY) 12.06-998 MG TABS Take 1 tablet by mouth 2 (two) times a day.    esomeprazole (NEXIUM) 40 MG capsule Take 40 mg by mouth daily at 12 noon.   famotidine (PEPCID) 40 MG tablet Take by mouth.   insulin glargine, 2 Unit Dial, (TOUJEO MAX SOLOSTAR) 300 UNIT/ML Solostar Pen Inject 40 Units into the skin daily.    iron polysaccharides (NIFEREX) 150 MG capsule Take 150 mg by mouth daily.   isosorbide mononitrate (IMDUR) 30 MG 24 hr tablet Take 30 mg by mouth daily.   ketorolac (ACULAR) 0.5 % ophthalmic solution Place 1 drop into both eyes 4 (four) times daily.   lisinopril (ZESTRIL) 40 MG tablet Take 40 mg by mouth daily.   nitroGLYCERIN (NITROSTAT) 0.4 MG SL tablet Place 0.4 mg under the tongue daily.   pantoprazole (PROTONIX) 40 MG tablet Take 40 mg by mouth daily.   Semaglutide (OZEMPIC, 0.25 OR 0.5 MG/DOSE, [place]) Inject into the skin.      Allergies:   Naproxen, Naproxen sodium, Tiotropium bromide monohydrate, and Penicillins   Social History   Socioeconomic History   Marital status: Single    Spouse name: Not on file   Number of children: Not on file   Years of education: Not on file   Highest education level: Not on file  Occupational History   Not on file  Tobacco Use   Smoking status: Former    Packs/day: 3.00    Years: 20.00    Pack years: 60.00    Types: Cigarettes   Smokeless tobacco: Current    Types: Chew   Tobacco comments:    1 can/daily  Substance and Sexual Activity   Alcohol use: Yes    Comment: social, 1x weekly   Drug use: No   Sexual activity: Never  Other Topics Concern  Not on file  Social History Narrative   Not on file   Social Determinants of Health   Financial Resource Strain: Not on file  Food Insecurity: Not on file  Transportation Needs: Not on file  Physical Activity: Not on file  Stress: Not on file  Social Connections: Not on file     Family History: The patient's family history includes Coronary artery disease in his father; Diabetes in his father.  ROS:   Please see the history of present illness.  All other systems reviewed and are negative.  Labs/Other Studies Reviewed:    The following studies were reviewed today:  LHC 5/21  The left ventricular systolic function is normal. LV end diastolic pressure is mildly elevated. The left ventricular ejection fraction is 55-65% by visual estimate. Dist RCA lesion is 50% stenosed. RPDA lesion is 60% stenosed. Dist Cx lesion is 85% stenosed. Prox LAD to Mid LAD lesion is 30% stenosed. 1st Diag lesion is 30% stenosed.   1.  Right dominant coronary arteries.  Moderately to severely calcified coronary arteries with significant one-vessel coronary artery disease affecting the distal left circumflex supplying OM 3.  There is moderate stenosis in the distal right coronary artery into the right PDA. 2.  Normal LV  systolic function mildly elevated left ventricular end-diastolic pressure.   Recommendations: The patient had apical ischemia on stress testing which does not correlate with the stenosis in the left circumflex.  The patient does require aggressive treatment of his risk factors considering the degree of atherosclerosis and calcifications. Recommend starting antianginal therapy.  I added carvedilol 3.125 mg twice daily and this can be uptitrated as tolerated. If the patient has residual angina in spite of antianginal therapy, left circumflex PCI can be performed.  Exercise Myoview 5/21  Nuclear stress EF: 54%. The left ventricular ejection fraction is mildly decreased (45-54%). Blood pressure demonstrated a normal response to exercise. There was no ST segment deviation noted during stress. Defect 1: There is a small defect of mild severity present in the apical inferior location. Findings consistent with ischemia. This is a low risk study.   There is a small, mild, reversible perfusion defect at the inferoapex. Returns to normal at rest.  No ischemia noted on stress ECG.   Findings suggest a small area of ischemia at the apex on perfusion imaging. Mildly reduced exercise capacity. Normal HR and BP response to exercise. Test stopped due to angina.  Normal wall motion on perfusion imaging.    Overall low risk study with ischemia at the apex.     Recent Labs: No results found for requested labs within last 8760 hours.  Recent Lipid Panel    Component Value Date/Time   CHOL  03/05/2010 0310    90        ATP III CLASSIFICATION:  <200     mg/dL   Desirable  200-239  mg/dL   Borderline High  >=240    mg/dL   High          TRIG 66 03/05/2010 0310   HDL 32 (L) 03/05/2010 0310   CHOLHDL 2.8 03/05/2010 0310   VLDL 13 03/05/2010 0310   LDLCALC  03/05/2010 0310    45        Total Cholesterol/HDL:CHD Risk Coronary Heart Disease Risk Table                     Men   Women  1/2 Average  Risk   3.4  3.3  Average Risk       5.0   4.4  2 X Average Risk   9.6   7.1  3 X Average Risk  23.4   11.0        Use the calculated Patient Ratio above and the CHD Risk Table to determine the patient's CHD Risk.        ATP III CLASSIFICATION (LDL):  <100     mg/dL   Optimal  100-129  mg/dL   Near or Above                    Optimal  130-159  mg/dL   Borderline  160-189  mg/dL   High  >190     mg/dL   Very High        Physical Exam:    VS:  BP 106/66    Pulse 73    Ht 5\' 9"  (1.753 m)    Wt 234 lb (106.1 kg)    SpO2 97%    BMI 34.56 kg/m     Wt Readings from Last 3 Encounters:  02/09/21 234 lb (106.1 kg)  09/24/19 241 lb (109.3 kg)  07/13/19 232 lb 9.6 oz (105.5 kg)     GEN:  Well nourished, well developed in no acute distress HEENT: Normal NECK: No JVD; No carotid bruits LYMPHATICS: No lymphadenopathy CARDIAC: RRR, no murmurs, rubs, gallops RESPIRATORY:  Clear to auscultation without rales, wheezing or rhonchi  ABDOMEN: Soft, obese, non-tender, non-distended MUSCULOSKELETAL:  No edema; No deformity  SKIN: Warm and dry NEUROLOGIC:  Alert and oriented x 3 PSYCHIATRIC:  Normal affect   EKG:  EKG is ordered today.  The ekg ordered today demonstrates NSR at rate of 73 bpm, no acute ST/T wave abnormality.  Diagnoses:    1. Essential hypertension   2. Coronary artery disease involving native coronary artery of native heart without angina pectoris   3. Hyperlipidemia LDL goal <70    Assessment and Plan:     CAD native without angina: He has history of moderate to severely calcified coronary artery disease distal circumflex disease by cardiac cath, and abnormal stress test indicating apical ischemia. Recommendation for medical therapy. He denies chest pain today and no further episodes since ED visit on 02/06/21.  He has concerns about the coronary occlusion.  We discussed the importance of compliance with medications, heart healthy diet, moderate physical activity to  equal 150 minutes/week, and weight loss for heart health. Spent > 10  min explaining symptoms that may be concerning for worsening ischemia and encouraged him to call the office in the future with questions.  Continue atorvastatin, asa, empagliflozin, beta blocker, semaglutide, Imdur.   Hyperlipidemia LDL goal < 70: :LDL 68 on 02/07/21. Encouraged heart healthy diet, regular physical activity, and weight loss. Continue atorvastatin.  Essential hypertension: Blood pressure is well controlled today and is actually a low reading for him.  He denies lightheadedness or dizziness. He was recently started on Imdur and is working on weight loss. Encouraged him to monitor BP and notify us if he becomes symptomatic with lower BP readings. Would favor reducing lisinopril to 20 mg. Continue Imdur, carvedilol, lisinopril.   DM: A1C 8.9% on 02/07/21. He is working on increasing physical activity, weight loss, and has started a low carb diet. Encouraged good glucose control in conjunction with good BP and lipid control for risk reduction. Continue Synjardy, insulin. Management per PCP.      Disposition: 4-5 months with  Dr. Margaretann Loveless   Medication Adjustments/Labs and Tests Ordered: Current medicines are reviewed at length with the patient today.  Concerns regarding medicines are outlined above.  Orders Placed This Encounter  Procedures   EKG 12-Lead   No orders of the defined types were placed in this encounter.   Patient Instructions  Medication Instructions:  Your Physician recommend you continue on your current medication as directed.    *If you need a refill on your cardiac medications before your next appointment, please call your pharmacy*   Lab Work: None ordered    Testing/Procedures: None ordered    Follow-Up: At [facility], you and your health needs are our priority.  As part of our continuing mission to provide you with exceptional heart care, we have created designated Provider Care  Teams.  These Care Teams include your primary Cardiologist (physician) and Advanced Practice Providers (APPs -  Physician Assistants and Nurse Practitioners) who all work together to provide you with the care you need, when you need it.  We recommend signing up for the patient portal called "MyChart".  Sign up information is provided on this After Visit Summary.  MyChart is used to connect with patients for Virtual Visits (Telemedicine).  Patients are able to view lab/test results, encounter notes, upcoming appointments, etc.  Non-urgent messages can be sent to your provider as well.   To learn more about what you can do with MyChart, go to [email or web address].    Your next appointment:   4-5 month(s)  The format for your next appointment:   In Person  Provider:   Elouise Munroe, MD     Other Instructions None today    Signed, Emmaline Life, NP  02/09/2021 12:39 PM    [place]

## 2021-02-09 ENCOUNTER — Other Ambulatory Visit: Payer: Self-pay

## 2021-02-09 ENCOUNTER — Ambulatory Visit (HOSPITAL_BASED_OUTPATIENT_CLINIC_OR_DEPARTMENT_OTHER): Payer: BC Managed Care – PPO | Admitting: Nurse Practitioner

## 2021-02-09 ENCOUNTER — Encounter (HOSPITAL_BASED_OUTPATIENT_CLINIC_OR_DEPARTMENT_OTHER): Payer: Self-pay | Admitting: Nurse Practitioner

## 2021-02-09 VITALS — BP 106/66 | HR 73 | Ht 69.0 in | Wt 234.0 lb

## 2021-02-09 DIAGNOSIS — Z794 Long term (current) use of insulin: Secondary | ICD-10-CM

## 2021-02-09 DIAGNOSIS — I2510 Atherosclerotic heart disease of native coronary artery without angina pectoris: Secondary | ICD-10-CM

## 2021-02-09 DIAGNOSIS — E785 Hyperlipidemia, unspecified: Secondary | ICD-10-CM

## 2021-02-09 DIAGNOSIS — E118 Type 2 diabetes mellitus with unspecified complications: Secondary | ICD-10-CM

## 2021-02-09 DIAGNOSIS — I10 Essential (primary) hypertension: Secondary | ICD-10-CM | POA: Diagnosis not present

## 2021-02-09 NOTE — Patient Instructions (Signed)
Medication Instructions:  Your Physician recommend you continue on your current medication as directed.    *If you need a refill on your cardiac medications before your next appointment, please call your pharmacy*   Lab Work: None ordered    Testing/Procedures: None ordered    Follow-Up: At [facility], you and your health needs are our priority.  As part of our continuing mission to provide you with exceptional heart care, we have created designated Provider Care Teams.  These Care Teams include your primary Cardiologist (physician) and Advanced Practice Providers (APPs -  Physician Assistants and Nurse Practitioners) who all work together to provide you with the care you need, when you need it.  We recommend signing up for the patient portal called "MyChart".  Sign up information is provided on this After Visit Summary.  MyChart is used to connect with patients for Virtual Visits (Telemedicine).  Patients are able to view lab/test results, encounter notes, upcoming appointments, etc.  Non-urgent messages can be sent to your provider as well.   To learn more about what you can do with MyChart, go to [email or web address].    Your next appointment:   4-5 month(s)  The format for your next appointment:   In Person  Provider:   Elouise Munroe, MD     Other Instructions None today

## 2021-02-17 ENCOUNTER — Other Ambulatory Visit: Payer: Self-pay

## 2021-02-17 ENCOUNTER — Encounter (INDEPENDENT_AMBULATORY_CARE_PROVIDER_SITE_OTHER): Payer: Self-pay | Admitting: Ophthalmology

## 2021-02-17 ENCOUNTER — Ambulatory Visit (INDEPENDENT_AMBULATORY_CARE_PROVIDER_SITE_OTHER): Payer: BC Managed Care – PPO | Admitting: Ophthalmology

## 2021-02-17 DIAGNOSIS — E113413 Type 2 diabetes mellitus with severe nonproliferative diabetic retinopathy with macular edema, bilateral: Secondary | ICD-10-CM

## 2021-02-17 DIAGNOSIS — D3132 Benign neoplasm of left choroid: Secondary | ICD-10-CM | POA: Diagnosis not present

## 2021-02-17 DIAGNOSIS — Z961 Presence of intraocular lens: Secondary | ICD-10-CM

## 2021-02-17 DIAGNOSIS — H40051 Ocular hypertension, right eye: Secondary | ICD-10-CM

## 2021-02-17 DIAGNOSIS — H3581 Retinal edema: Secondary | ICD-10-CM

## 2021-02-17 DIAGNOSIS — H35033 Hypertensive retinopathy, bilateral: Secondary | ICD-10-CM | POA: Diagnosis not present

## 2021-02-17 DIAGNOSIS — I10 Essential (primary) hypertension: Secondary | ICD-10-CM

## 2021-02-17 MED ORDER — AFLIBERCEPT 2MG/0.05ML IZ SOLN FOR KALEIDOSCOPE
2.0000 mg | INTRAVITREAL | Status: AC | PRN
  Administered 2021-02-17: 2 mg via INTRAVITREAL

## 2021-02-17 NOTE — Progress Notes (Signed)
[facility] Note  02/17/2021     CHIEF COMPLAINT Patient presents for Retina Follow Up    HISTORY OF PRESENT ILLNESS: Juan [name] is a 61 y.o. male who presents to the clinic today for:   HPI     Retina Follow Up   Patient presents with  Diabetic Retinopathy.  In both eyes.  Severity is severe.  Duration of 5 weeks.  Since onset it is stable.  I, the attending physician,  performed the HPI with the patient and updated documentation appropriately.        Comments   Pt here for 5 wk ret f/u NPDR OU. Pt states no vision changes or issues. Last A1C level was 8.9, few weeks back. He has been on prednisone since November due to sinus issues and bronchitis and was hospitalized in the last few weeks for chest pain. Heart was checked and everything is ok, they determined it is reflux causing angina. Pt reports taking PF in the eve OU, Ketorolac in the eve OU and Cosopt in the evenings OU.       Last edited by Bernarda Caffey, MD on 02/17/2021  4:39 PM.     Referring physician: Rutherford Guys, [place],  [place] [id number]  HISTORICAL INFORMATION:   Selected notes from the MEDICAL RECORD NUMBER Referred by Dr. Rutherford Guys for concern of CME s/p cataract sx   CURRENT MEDICATIONS: Current Outpatient Medications (Ophthalmic Drugs)  Medication Sig   dorzolamide-timolol (COSOPT) 22.3-6.8 MG/ML ophthalmic solution Place 1 drop into the right eye 2 (two) times daily. (Patient taking differently: Place 1 drop into both eyes 2 (two) times daily.)   ketorolac (ACULAR) 0.5 % ophthalmic solution Place 1 drop into both eyes 4 (four) times daily. (Patient taking differently: Place 1 drop into both eyes at bedtime.)   No current facility-administered medications for this visit. (Ophthalmic Drugs)   Current Outpatient Medications (Other)  Medication Sig   ADVAIR DISKUS 500-50 MCG/DOSE AEPB Inhale 1 puff into the lungs 2 (two) times daily.    albuterol (VENTOLIN HFA) 108 (90 Base) MCG/ACT inhaler Inhale 1-2 puffs into the lungs every 6 (six) hours as needed for wheezing or shortness of breath.   alfuzosin (UROXATRAL) 10 MG 24 hr tablet TAKE 1 TABLET BY MOUTH EVERYDAY AT BEDTIME   aspirin EC 81 MG tablet Take 81 mg by mouth daily.   atorvastatin (LIPITOR) 40 MG tablet Take 40 mg by mouth daily.   carvedilol (COREG) 6.25 MG tablet TAKE 1 TABLET BY MOUTH TWICE A DAY WITH A MEAL   Empagliflozin-metFORMIN HCl (SYNJARDY) 12.06-998 MG TABS Take 1 tablet by mouth 2 (two) times a day.    esomeprazole (NEXIUM) 40 MG capsule Take 40 mg by mouth daily at 12 noon.   famotidine (PEPCID) 40 MG tablet Take by mouth.   insulin glargine, 2 Unit Dial, (TOUJEO MAX SOLOSTAR) 300 UNIT/ML Solostar Pen Inject 40 Units into the skin daily.    iron polysaccharides (NIFEREX) 150 MG capsule Take 150 mg by mouth daily.   isosorbide mononitrate (IMDUR) 30 MG 24 hr tablet Take 30 mg by mouth daily.   lisinopril (ZESTRIL) 40 MG tablet Take 40 mg by mouth daily.   nitroGLYCERIN (NITROSTAT) 0.4 MG SL tablet Place 0.4 mg under the tongue daily.   nitroGLYCERIN (NITROSTAT) 0.4 MG SL tablet Place under the tongue.   pantoprazole (PROTONIX) 40 MG tablet Take 40 mg by mouth daily.  Semaglutide (OZEMPIC, 0.25 OR 0.5 MG/DOSE, [place]) Inject into the skin.   No current facility-administered medications for this visit. (Other)   REVIEW OF SYSTEMS: ROS   Positive for: Gastrointestinal, Musculoskeletal, Endocrine, Eyes, Respiratory Negative for: Constitutional, Neurological, Skin, Genitourinary, HENT, Cardiovascular, Psychiatric, Allergic/Imm, Heme/Lymph Last edited by Kingsley Spittle, COT on 02/17/2021  2:23 PM.      ALLERGIES Allergies  Allergen Reactions   Naproxen Shortness Of Breath   Naproxen Sodium Shortness Of Breath   Tiotropium Bromide Monohydrate Shortness Of Breath   Penicillins Rash    Has patient had a PCN reaction causing immediate rash,  facial/tongue/throat swelling, SOB or lightheadedness with hypotension: Yes Has patient had a PCN reaction causing severe rash involving mucus membranes or skin necrosis: No Has patient had a PCN reaction that required hospitalization No Has patient had a PCN reaction occurring within the last 10 years: No If all of the above answers are "NO", then may proceed with Cephalosporin use.  Has patient had a PCN reaction causing immediate rash, facial/tongue/throat swelling, SOB or lightheadedness with hypotension: Yes Has patient had a PCN reaction causing severe rash involving mucus membranes or skin necrosis: No Has patient had a PCN reaction that required hospitalization No Has patient had a PCN reaction occurring within the last 10 years: No If all of the above answers are "NO", then may proceed with Cephalosporin use.    PAST MEDICAL HISTORY Past Medical History:  Diagnosis Date   Arthritis    Asthma    COPD (chronic obstructive pulmonary disease) ([place])    Diabetes mellitus    Type 2   Diabetic retinopathy ([place])    NPDR OU   GERD (gastroesophageal reflux disease)    H/O hiatal hernia    History of kidney stones    Hyperlipemia    Hypertension    Hypertensive retinopathy    OU   Nasal polyps    Pneumonia 2014   Shortness of breath    Sleep apnea    pt has CPAP but is unable to use it d/t having polyps in his nose. Pt stated "I need to call and get a CPAP mask instead"   Stomach ulcer    Past Surgical History:  Procedure Laterality Date   CATARACT EXTRACTION Right 07/05/2018   Dr. Gershon Crane   CATARACT EXTRACTION Left 07/12/2018   Dr. Gershon Crane   CHOLECYSTECTOMY N/A 01/23/2015   Procedure: LAPAROSCOPIC CHOLECYSTECTOMY ;  Surgeon: Georganna Skeans, MD;  Location: [facility];  Service: General;  Laterality: N/A;   COLONOSCOPY W/ POLYPECTOMY     ESOPHAGOGASTRODUODENOSCOPY     EYE SURGERY     KNEE ARTHROSCOPY  03/24/2011   Procedure: ARTHROSCOPY KNEE;  Surgeon: Alta Corning, MD;   Location: [facility];  Service: Orthopedics;  Laterality: Right;  partial medial menisectomy, chondroplaty patella, and medial medial plica   LEFT HEART CATH AND CORONARY ANGIOGRAPHY N/A 06/29/2019   Procedure: LEFT HEART CATH AND CORONARY ANGIOGRAPHY;  Surgeon: Wellington Hampshire, MD;  Location: [facility];  Service: Cardiovascular;  Laterality: N/A;   FAMILY HISTORY Family History  Problem Relation Age of Onset   Diabetes Father    Coronary artery disease Father    SOCIAL HISTORY Social History   Tobacco Use   Smoking status: Former    Packs/day: 3.00    Years: 20.00    Pack years: 60.00    Types: Cigarettes   Smokeless tobacco: Current    Types: Chew   Tobacco  comments:    1 can/daily  Substance Use Topics   Alcohol use: Yes    Comment: social, 1x weekly   Drug use: No       OPHTHALMIC EXAM:  Base Eye Exam     Visual Acuity (Snellen - Linear)       Right Left   Dist [place] 20/30 20/50 -2   Dist ph [place] 20/20 -2 20/50         Tonometry (Tonopen, 2:34 PM)       Right Left   Pressure 22 19         Pupils       Dark Light Shape React APD   Right 3 2 Round Brisk None   Left 3 2 Round Brisk None         Visual Fields (Counting fingers)       Left Right     Full   Restrictions Partial inner superior temporal deficiency          Extraocular Movement       Right Left    Full, Ortho Full, Ortho         Neuro/Psych     Oriented x3: Yes   Mood/Affect: Normal         Dilation     Both eyes: 1.0% Mydriacyl, 2.5% Phenylephrine @ 2:35 PM           Slit Lamp and Fundus Exam     Slit Lamp Exam       Right Left   Lids/Lashes Dermatochalasis - upper lid, Telangiectasia, mild Meibomian gland dysfunction Dermatochalasis - upper lid, Telangiectasia, mild Meibomian gland dysfunction   Conjunctiva/Sclera White and quiet, +concretions on palpebral conj Mild nasal Pinguecula, +concretions on palpebral conj   Cornea Mild  Arcus, Well healed temporal cataract wounds Arcus, 1+ Descemet's folds, Well healed temporal cataract wounds   Anterior Chamber deep, 1+ cell/pigment deep, 1+ cell/pigment   Iris Round and dilated, mild patch of atrophy at 0800, no NVI Round and dilated, No NVI   Lens PC IOL in good position PC IOL in good position   Anterior Vitreous Vitreous syneresis Vitreous syneresis         Fundus Exam       Right Left   Disc Pink and Sharp Pallor, Sharp rim, Compact   C/D Ratio 0.3 0.3   Macula Good foveal reflex, scattered IRH/DBH/exudates -- greatest temporal macula - improved, trace residual cystic changes remain -- slightly improved Blunted foveal reflex, +central atrophy, scattered DBH and MAs, scattered punctate exudates - greatest superior and temporal macula -- persistent, cystic changes/edema temporal macula - persistent   Vessels Vascular attenuation, Tortuous Vascular attenuation, Tortuous   Periphery Attached, scattered MA and exudates greatest posteriorly Attached, pigmented choroidal nevus superiorly with mild elevation, +drusen, no SRF or orange pigment -- unchanged from prior           IMAGING AND PROCEDURES  Imaging and Procedures for _0 @  OCT, Retina - OU - Both Eyes       Right Eye Quality was good. Central Foveal Thickness: 253. Progression has improved. Findings include intraretinal hyper-reflective material, intraretinal fluid, outer retinal atrophy, no SRF, normal foveal contour (Mild improvement IRF/cystic changes).   Left Eye Quality was good. Central Foveal Thickness: 215. Progression has been stable. Findings include intraretinal fluid, intraretinal hyper-reflective material, no SRF, outer retinal atrophy, normal foveal contour (Persistent IRF/IRHM greatest ST mac, persistent central ORA; Sub-RPE hyper-reflective mass consistent with choroidal nevus--superior to  disc, caught on widefield -- stable from prior).   Notes *Images captured and stored on  drive  Diagnosis / Impression: +DME OU OD: mild improvement IRF/cystic changes OS: Persistent IRF/IRHM greatest ST mac, persistent central ORA; Sub-RPE hyper-reflective mass consistent with choroidal nevus--superior to disc, caught on widefield -- stable from prior  Clinical management:  See below  Abbreviations: NFP - Normal foveal profile. CME - cystoid macular edema. PED - pigment epithelial detachment. IRF - intraretinal fluid. SRF - subretinal fluid. EZ - ellipsoid zone. ERM - epiretinal membrane. ORA - outer retinal atrophy. ORT - outer retinal tubulation. SRHM - subretinal hyper-reflective material      Intravitreal Injection, Pharmacologic Agent - OS - Left Eye       Time Out 02/17/2021. 3:42 PM. Confirmed correct patient, procedure, site, and patient consented.   Anesthesia Topical anesthesia was used. Anesthetic medications included Lidocaine 2%, Proparacaine 0.5%.   Procedure Preparation included 5% betadine to ocular surface, eyelid speculum. A (32g) needle was used.   Injection: 2 mg aflibercept 2 MG/0.05ML   Route: Intravitreal, Site: Left Eye   NDC: A3590391, Lot: [id number], Expiration date: 12/31/2021, Waste: 0.05 mL   Post-op Post injection exam found visual acuity of at least counting fingers. The patient tolerated the procedure well. There were no complications. The patient received written and verbal post procedure care education. Post injection medications were not given.             ASSESSMENT/PLAN:    ICD-10-CM   1. 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 Retinal edema  H35.81     3. 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 Essential hypertension  I10     5. Hypertensive retinopathy of both eyes  H35.033     6.  Pseudophakia of both eyes  Z96.1     7. Ocular hypertension of right eye  H40.051      1. Severe nonproliferative diabetic retinopathy with DME, OU (OS>OD)  - s/p IVA OD #1 (06.26.20), #2 (08.14.20), #3 (09.17.20), #4 (10.16.20), #5 (02.02.22)  - s/p IVA OS #1 (07.17.20), #2 (08.14.20), #3 (09.17.20)  - s/p IVE OS #1 (10.16.20) - sample, #2 (11.13.20), #3 (12.11.20), #4 (01.18.21), #5 (02.19.21), #6 (03.19.21), #7 (04.16.21), #8 (05.14.21), #9 (06.17.21), #10 (7.16.21), #11 (09.21.21), #12 (10.29.21), #13 (11.29.21), #14 (12.29.21), #15 (02.02.22, sample), #16 (03.09.22), #17 (04.06.22), #18 (05.11.2012), #19 (06.15.22), #20 (7.20.22), #21 (08.24.22), #22 (10.4.22), #23 (11.8.22), #24 (12.13.22)  - s/p IVE OD #1 (11.13.20), #2 (12.11.20), #3 (01.18.21), #4 (02.19.21), #5 (03.19.21), #6 (04.16.21), #7 (05.14.21), #8 (06.17.21), #9 (07.16.21), #10 (09.21.21), #11 (10.29.21), #12 (11.29.21), #13 (12.29.21), #14 (03.09.22), #15 (04.06.22), #16 (05.11.12), #17 (06.15.22), #18 (7.20.22), #19 (08.24.22), #20 (10.4.22), #21 (11.8.22), #22 (12.13.22)  - FA (06.26.20) shows Severe NPDR OU w/ Late leaking MA OU, Enlarged FAZ OU, No NV OU, No significant hyperfluorescence of disc  - BCVA OD 20/20; OS 20/50 -- improved  - OCT shows OD: mild improvement IRF/cystic changes, OS: persistent IRF/IRHM greatest ST mac, persistent central ORA  - recommend IVE OS #25, 01.17.23 -- Eylea approved until 03/05/2021 and has Eylea 4U  - recommend holding IVE OD -- pt in agreement  - pt wishes to proceed  -  RBA of procedure discussed, questions answered  - informed consent obtained  - Eylea informed consent form signed and scanned on 01.18.2021  - see procedure note -- AC taps performed OU due to elevated IOP and decreased vision  - [id number] Benefits Investigation initiated 10.16.2020 -- approved for 2022  - f/u in 5 weeks-- DFE/OCT/possible injection   2. Retinal Edema OU  - based on FA, suspect majority of macular edema  due to DM as above, but there may be some edema secondary to chronic post-op CME  - cont PF qid OU and ketorolac qid OU   - history of difficulty with compliance at work  - monitor  - f/u in 5 weeks  3. Choroidal Nevus OS  - located at 1200, mild elevation, +drusen, no SRF  - baseline optos pictures obtained, 06.26.20  - discussed possible referral to oncology at [facility] in [place]  4,5.Hypertensive retinopathy OU  - discussed importance of tight BP control  - monitor  6. Pseudophakia OU  - s/p CE/IOL OU (OD on 06.03.20 and OS 06.10.20 by Dr. Gershon Crane)  - beautiful surgeries w/ IOLs in excellent position  - macular edema limiting vision as above  - monitor  7. Ocular hypertension OD  - IOP 22 OD  - cont Cosopt BID OD  - monitor  Ophthalmic Meds Ordered this visit:  Meds ordered this encounter  Medications   aflibercept (EYLEA) SOLN 2 mg     Return in about 5 weeks (around 03/24/2021) for DFE, OCT, possible injection.  There are no Patient Instructions on file for this visit.  This document serves as a record of services personally performed by Gardiner Sleeper, MD, PhD. It was created on their behalf by Leonie [name], an ophthalmic technician. The creation of this record is the provider's dictation and/or activities during the visit.    Electronically signed by: Leonie [name] COA, 02/17/21  4:42 PM  This document serves as a record of services personally performed by Gardiner Sleeper, MD, PhD. It was created on their behalf by Estill Bakes, COT an ophthalmic technician. The creation of this record is the provider's dictation and/or activities during the visit.    Electronically signed by: Estill Bakes, [place] 1.17.23 @ 4:42 PM   Gardiner Sleeper, M.D., Ph.D. Diseases & Surgery of the Retina and Vitreous [facility]  I have reviewed the above documentation for accuracy and completeness, and I agree with the above. Gardiner Sleeper, M.D., Ph.D.  02/17/21 4:42 PM  Abbreviations: M myopia (nearsighted); A astigmatism; H hyperopia (farsighted); P presbyopia; Mrx spectacle prescription;  CTL contact lenses; OD right eye; OS left eye; OU both eyes  XT exotropia; ET esotropia; PEK punctate epithelial keratitis; PEE punctate epithelial erosions; DES dry eye syndrome; MGD meibomian gland dysfunction; ATs artificial tears; PFAT's preservative free artificial tears; [place] nuclear sclerotic cataract; PSC posterior subcapsular cataract; ERM epi-retinal membrane; PVD posterior vitreous detachment; RD retinal detachment; DM diabetes mellitus; DR diabetic retinopathy; NPDR non-proliferative diabetic retinopathy; PDR proliferative diabetic retinopathy; CSME clinically significant macular edema; DME diabetic macular edema; dbh dot blot hemorrhages; CWS cotton wool spot; POAG primary open angle glaucoma; C/D cup-to-disc ratio; HVF humphrey visual field; GVF goldmann visual field; OCT optical coherence tomography; IOP intraocular pressure; BRVO Branch retinal vein occlusion; CRVO central retinal vein occlusion; CRAO central retinal artery occlusion; BRAO branch retinal artery occlusion; RT retinal tear; SB scleral buckle; PPV pars plana vitrectomy; VH Vitreous hemorrhage; PRP panretinal laser photocoagulation;  IVK intravitreal kenalog; VMT vitreomacular traction; MH Macular hole;  NVD neovascularization of the disc; NVE neovascularization elsewhere; AREDS age related eye disease study; ARMD age related macular degeneration; POAG primary open angle glaucoma; EBMD epithelial/anterior basement membrane dystrophy; ACIOL anterior chamber intraocular lens; IOL intraocular lens; PCIOL posterior chamber intraocular lens; Phaco/IOL phacoemulsification with intraocular lens placement; [place] photorefractive keratectomy; LASIK laser assisted in situ keratomileusis; HTN hypertension; DM diabetes mellitus; COPD chronic obstructive pulmonary disease

## 2021-02-27 DIAGNOSIS — E1129 Type 2 diabetes mellitus with other diabetic kidney complication: Secondary | ICD-10-CM | POA: Diagnosis not present

## 2021-02-27 DIAGNOSIS — E113413 Type 2 diabetes mellitus with severe nonproliferative diabetic retinopathy with macular edema, bilateral: Secondary | ICD-10-CM | POA: Diagnosis not present

## 2021-02-27 DIAGNOSIS — E1159 Type 2 diabetes mellitus with other circulatory complications: Secondary | ICD-10-CM | POA: Diagnosis not present

## 2021-02-27 DIAGNOSIS — E1169 Type 2 diabetes mellitus with other specified complication: Secondary | ICD-10-CM | POA: Diagnosis not present

## 2021-02-27 DIAGNOSIS — I152 Hypertension secondary to endocrine disorders: Secondary | ICD-10-CM | POA: Diagnosis not present

## 2021-02-27 DIAGNOSIS — Z794 Long term (current) use of insulin: Secondary | ICD-10-CM | POA: Diagnosis not present

## 2021-02-27 DIAGNOSIS — E1140 Type 2 diabetes mellitus with diabetic neuropathy, unspecified: Secondary | ICD-10-CM | POA: Diagnosis not present

## 2021-03-05 DIAGNOSIS — Z0000 Encounter for general adult medical examination without abnormal findings: Secondary | ICD-10-CM | POA: Diagnosis not present

## 2021-03-05 DIAGNOSIS — E785 Hyperlipidemia, unspecified: Secondary | ICD-10-CM | POA: Diagnosis not present

## 2021-03-05 DIAGNOSIS — Z125 Encounter for screening for malignant neoplasm of prostate: Secondary | ICD-10-CM | POA: Diagnosis not present

## 2021-03-05 DIAGNOSIS — E113413 Type 2 diabetes mellitus with severe nonproliferative diabetic retinopathy with macular edema, bilateral: Secondary | ICD-10-CM | POA: Diagnosis not present

## 2021-03-05 DIAGNOSIS — I10 Essential (primary) hypertension: Secondary | ICD-10-CM | POA: Diagnosis not present

## 2021-03-10 ENCOUNTER — Telehealth: Payer: Self-pay | Admitting: Internal Medicine

## 2021-03-10 MED ORDER — CARVEDILOL 6.25 MG PO TABS: ORAL_TABLET | ORAL | 1 refills | Status: DC

## 2021-03-10 NOTE — Telephone Encounter (Signed)
Sent Carvedilol (Coreg) 6.25 MG to [facility]. Isosorbide Mononitrate (Imdur) 30 MG and Nitroglycerin (Nitrostat) 0.4 MG was sent on 02/08/2021 to [facility].

## 2021-03-10 NOTE — Telephone Encounter (Signed)
°*  STAT* If patient is at the pharmacy, call can be transferred to refill team.   1. Which medications need to be refilled? (please list name of each medication and dose if known)  carvedilol (COREG) 6.25 MG tablet   isosorbide mononitrate (IMDUR) 30 MG 24 hr tablet   nitroGLYCERIN (NITROSTAT) 0.4 MG SL tablet  2. Which pharmacy/location (including street and city if local pharmacy) is medication to be sent to?[facility] ([facility] ) - [place], [place]: [phone] F: [phone]  3. Do they need a 30 day or 90 day supply? 90 ds

## 2021-03-18 NOTE — Progress Notes (Signed)
[facility] Note  03/24/2021     CHIEF COMPLAINT Patient presents for Retina Follow Up   HISTORY OF PRESENT ILLNESS: Juan [name] is a 61 y.o. male who presents to the clinic today for:   HPI     Retina Follow Up   Patient presents with  Diabetic Retinopathy.  In both eyes.  This started 5 weeks ago.  I, the attending physician,  performed the HPI with the patient and updated documentation appropriately.        Comments   Patient here for 5 weeks retina follow up for NPDR OU. Patient states vision last week OD has felt weak. Vision been kinda blurry. This am has cleared up and feels a lot better. No eye pain.       Last edited by Bernarda Caffey, MD on 03/25/2021 12:28 AM.    Pt states last week OD was weak and watering, vision was blurry, he thinks it may have been allergies  Referring physician: Harlan Stains, MD [place],  [place] [id number]  HISTORICAL INFORMATION:   Selected notes from the MEDICAL RECORD NUMBER Referred by Dr. Rutherford Guys for concern of CME s/p cataract sx   CURRENT MEDICATIONS: Current Outpatient Medications (Ophthalmic Drugs)  Medication Sig   dorzolamide-timolol (COSOPT) 22.3-6.8 MG/ML ophthalmic solution Place 1 drop into the right eye 2 (two) times daily. (Patient taking differently: Place 1 drop into both eyes 2 (two) times daily.)   No current facility-administered medications for this visit. (Ophthalmic Drugs)   Current Outpatient Medications (Other)  Medication Sig   ADVAIR DISKUS 500-50 MCG/DOSE AEPB Inhale 1 puff into the lungs 2 (two) times daily.   albuterol (VENTOLIN HFA) 108 (90 Base) MCG/ACT inhaler Inhale 1-2 puffs into the lungs every 6 (six) hours as needed for wheezing or shortness of breath.   alfuzosin (UROXATRAL) 10 MG 24 hr tablet TAKE 1 TABLET BY MOUTH EVERYDAY AT BEDTIME   aspirin EC 81 MG tablet Take 81 mg by mouth daily.   atorvastatin (LIPITOR) 40 MG tablet  Take 40 mg by mouth daily.   carvedilol (COREG) 6.25 MG tablet TAKE 1 TABLET BY MOUTH TWICE A DAY WITH A MEAL   Empagliflozin-metFORMIN HCl (SYNJARDY) 12.06-998 MG TABS Take 1 tablet by mouth 2 (two) times a day.    esomeprazole (NEXIUM) 40 MG capsule Take 40 mg by mouth daily at 12 noon.   famotidine (PEPCID) 40 MG tablet Take by mouth.   insulin glargine, 2 Unit Dial, (TOUJEO MAX SOLOSTAR) 300 UNIT/ML Solostar Pen Inject 40 Units into the skin daily.    iron polysaccharides (NIFEREX) 150 MG capsule Take 150 mg by mouth daily.   isosorbide mononitrate (IMDUR) 30 MG 24 hr tablet Take 30 mg by mouth daily.   lisinopril (ZESTRIL) 40 MG tablet Take 40 mg by mouth daily.   nitroGLYCERIN (NITROSTAT) 0.4 MG SL tablet Place 0.4 mg under the tongue daily.   nitroGLYCERIN (NITROSTAT) 0.4 MG SL tablet Place under the tongue.   pantoprazole (PROTONIX) 40 MG tablet Take 40 mg by mouth daily.   Semaglutide (OZEMPIC, 0.25 OR 0.5 MG/DOSE, [place]) Inject into the skin.   No current facility-administered medications for this visit. (Other)   REVIEW OF SYSTEMS: ROS   Positive for: Gastrointestinal, Musculoskeletal, Endocrine, Eyes, Respiratory Negative for: Constitutional, Neurological, Skin, Genitourinary, HENT, Cardiovascular, Psychiatric, Allergic/Imm, Heme/Lymph Last edited by Theodore Demark, COA on 03/24/2021  2:36 PM.     ALLERGIES  Allergies  Allergen Reactions   Naproxen Shortness Of Breath   Naproxen Sodium Shortness Of Breath   Tiotropium Bromide Monohydrate Shortness Of Breath   Penicillins Rash    Has patient had a PCN reaction causing immediate rash, facial/tongue/throat swelling, SOB or lightheadedness with hypotension: Yes Has patient had a PCN reaction causing severe rash involving mucus membranes or skin necrosis: No Has patient had a PCN reaction that required hospitalization No Has patient had a PCN reaction occurring within the last 10 years: No If all of the above answers are "NO",  then may proceed with Cephalosporin use.  Has patient had a PCN reaction causing immediate rash, facial/tongue/throat swelling, SOB or lightheadedness with hypotension: Yes Has patient had a PCN reaction causing severe rash involving mucus membranes or skin necrosis: No Has patient had a PCN reaction that required hospitalization No Has patient had a PCN reaction occurring within the last 10 years: No If all of the above answers are "NO", then may proceed with Cephalosporin use.    PAST MEDICAL HISTORY Past Medical History:  Diagnosis Date   Arthritis    Asthma    COPD (chronic obstructive pulmonary disease) ([place])    Diabetes mellitus    Type 2   Diabetic retinopathy ([place])    NPDR OU   GERD (gastroesophageal reflux disease)    H/O hiatal hernia    History of kidney stones    Hyperlipemia    Hypertension    Hypertensive retinopathy    OU   Nasal polyps    Pneumonia 2014   Shortness of breath    Sleep apnea    pt has CPAP but is unable to use it d/t having polyps in his nose. Pt stated "I need to call and get a CPAP mask instead"   Stomach ulcer    Past Surgical History:  Procedure Laterality Date   CATARACT EXTRACTION Right 07/05/2018   Dr. Gershon Crane   CATARACT EXTRACTION Left 07/12/2018   Dr. Gershon Crane   CHOLECYSTECTOMY N/A 01/23/2015   Procedure: LAPAROSCOPIC CHOLECYSTECTOMY ;  Surgeon: Georganna Skeans, MD;  Location: [facility];  Service: General;  Laterality: N/A;   COLONOSCOPY W/ POLYPECTOMY     ESOPHAGOGASTRODUODENOSCOPY     EYE SURGERY     KNEE ARTHROSCOPY  03/24/2011   Procedure: ARTHROSCOPY KNEE;  Surgeon: Alta Corning, MD;  Location: [facility];  Service: Orthopedics;  Laterality: Right;  partial medial menisectomy, chondroplaty patella, and medial medial plica   LEFT HEART CATH AND CORONARY ANGIOGRAPHY N/A 06/29/2019   Procedure: LEFT HEART CATH AND CORONARY ANGIOGRAPHY;  Surgeon: Wellington Hampshire, MD;  Location: [facility];  Service:  Cardiovascular;  Laterality: N/A;   FAMILY HISTORY Family History  Problem Relation Age of Onset   Diabetes Father    Coronary artery disease Father    SOCIAL HISTORY Social History   Tobacco Use   Smoking status: Former    Packs/day: 3.00    Years: 20.00    Pack years: 60.00    Types: Cigarettes   Smokeless tobacco: Current    Types: Chew   Tobacco comments:    1 can/daily  Vaping Use   Vaping Use: Never used  Substance Use Topics   Alcohol use: Yes    Comment: social, 1x weekly   Drug use: No       OPHTHALMIC EXAM:  Base Eye Exam     Visual Acuity (Snellen - Linear)       Right Left  Dist [place] 20/20 20/50   Dist ph [place]  20/40 -1         Tonometry (Tonopen, 2:32 PM)       Right Left   Pressure 15 15         Pupils       Dark Light Shape React APD   Right 3 2 Round Brisk None   Left 3 2 Round Brisk None         Visual Fields (Counting fingers)       Left Right     Full   Restrictions Partial inner superior temporal deficiency          Extraocular Movement       Right Left    Full, Ortho Full, Ortho         Neuro/Psych     Oriented x3: Yes   Mood/Affect: Normal         Dilation     Both eyes: 1.0% Mydriacyl, 2.5% Phenylephrine @ 2:32 PM           Slit Lamp and Fundus Exam     Slit Lamp Exam       Right Left   Lids/Lashes Dermatochalasis - upper lid, Telangiectasia, mild Meibomian gland dysfunction Dermatochalasis - upper lid, Telangiectasia, mild Meibomian gland dysfunction   Conjunctiva/Sclera White and quiet, +concretions on palpebral conj Mild nasal Pinguecula, +concretions on palpebral conj   Cornea Mild Arcus, Well healed temporal cataract wounds Arcus, 1+ Descemet's folds, Well healed temporal cataract wounds   Anterior Chamber deep, 1+ cell/pigment deep, 1+ cell/pigment   Iris Round and dilated, mild patch of atrophy at 0800, no NVI Round and dilated, No NVI   Lens PC IOL in good position PC IOL in good  position   Anterior Vitreous Vitreous syneresis Vitreous syneresis         Fundus Exam       Right Left   Disc Pink and Sharp Pallor, Sharp rim, Compact   C/D Ratio 0.3 0.3   Macula Good foveal reflex, scattered IRH/DBH/exudates -- greatest temporal macula - improved, trace residual cystic changes remain -- slightly increased Blunted foveal reflex, +central atrophy, scattered DBH and MAs, scattered punctate exudates - greatest superior and temporal macula -- persistent, cystic changes/edema temporal macula - persistent   Vessels Vascular attenuation, Tortuous Vascular attenuation, Tortuous   Periphery Attached, scattered MA and exudates greatest posteriorly Attached, pigmented choroidal nevus superiorly with mild elevation, +drusen, no SRF or orange pigment -- unchanged from prior           IMAGING AND PROCEDURES  Imaging and Procedures for _0 @  OCT, Retina - OU - Both Eyes       Right Eye Quality was good. Central Foveal Thickness: 261. Progression has worsened. Findings include intraretinal hyper-reflective material, intraretinal fluid, outer retinal atrophy, no SRF, abnormal foveal contour (Mild interval increase in IRF/cystic changes).   Left Eye Quality was good. Central Foveal Thickness: 210. Progression has been stable. Findings include intraretinal fluid, intraretinal hyper-reflective material, no SRF, outer retinal atrophy, normal foveal contour (Persistent IRF/IRHM greatest ST mac, persistent central ORA; Sub-RPE hyper-reflective mass consistent with choroidal nevus--superior to disc, caught on widefield -- stable from prior).   Notes *Images captured and stored on drive  Diagnosis / Impression: +DME OU OD: Mild interval increase in IRF/cystic changes OS: Persistent IRF/IRHM greatest ST mac, persistent central ORA; Sub-RPE hyper-reflective mass consistent with choroidal nevus--superior to disc, caught on widefield -- stable from prior  Clinical  management:  See  below  Abbreviations: NFP - Normal foveal profile. CME - cystoid macular edema. PED - pigment epithelial detachment. IRF - intraretinal fluid. SRF - subretinal fluid. EZ - ellipsoid zone. ERM - epiretinal membrane. ORA - outer retinal atrophy. ORT - outer retinal tubulation. SRHM - subretinal hyper-reflective material      Intravitreal Injection, Pharmacologic Agent - OD - Right Eye       Time Out 03/24/2021. 3:43 PM. Confirmed correct patient, procedure, site, and patient consented.   Anesthesia Topical anesthesia was used. Anesthetic medications included Lidocaine 2%, Proparacaine 0.5%.   Procedure Preparation included 5% betadine to ocular surface, eyelid speculum. A (32g) needle was used.   Injection: 2 mg aflibercept 2 MG/0.05ML   Route: Intravitreal, Site: Right Eye   NDC: A3590391, Lot: [id number], Expiration date: 01/31/2022, Waste: 0.05 mL   Post-op Post injection exam found visual acuity of at least counting fingers. The patient tolerated the procedure well. There were no complications. The patient received written and verbal post procedure care education. Post injection medications were not given.      Intravitreal Injection, Pharmacologic Agent - OS - Left Eye       Time Out 03/24/2021. 3:44 PM. Confirmed correct patient, procedure, site, and patient consented.   Anesthesia Topical anesthesia was used. Anesthetic medications included Lidocaine 2%, Proparacaine 0.5%.   Procedure Preparation included 5% betadine to ocular surface, eyelid speculum. A (32g) needle was used.   Injection: 2 mg aflibercept 2 MG/0.05ML   Route: Intravitreal, Site: Left Eye   NDC: A3590391, Lot: [id number], Expiration date: 01/31/2022, Waste: 0.05 mL   Post-op Post injection exam found visual acuity of at least counting fingers. The patient tolerated the procedure well. There were no complications. The patient received written and verbal post procedure care education. Post  injection medications were not given.            ASSESSMENT/PLAN:    ICD-10-CM   1. Severe nonproliferative diabetic retinopathy of both eyes with macular edema associated with type 2 diabetes mellitus (HCC)  E11.3413 OCT, Retina - OU - Both Eyes    Intravitreal Injection, Pharmacologic Agent - OD - Right Eye    Intravitreal Injection, Pharmacologic Agent - OS - Left Eye    aflibercept (EYLEA) SOLN 2 mg    aflibercept (EYLEA) SOLN 2 mg    2. Retinal edema  H35.81     3. 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 Essential hypertension  I10     5. Hypertensive retinopathy of both eyes  H35.033     6. Pseudophakia of both eyes  Z96.1     7. Ocular hypertension of right eye  H40.051      1. Severe nonproliferative diabetic retinopathy with DME, OU (OS>OD)  - s/p IVA OD #1 (06.26.20), #2 (08.14.20), #3 (09.17.20), #4 (10.16.20), #5 (02.02.22)  - s/p IVA OS #1 (07.17.20), #2 (08.14.20), #3 (09.17.20)  - s/p IVE OS #1 (10.16.20) - sample, #2 (11.13.20), #3 (12.11.20), #4 (01.18.21), #5 (02.19.21), #6 (03.19.21), #7 (04.16.21), #8 (05.14.21), #9 (06.17.21), #10 (7.16.21), #11 (09.21.21), #12 (10.29.21), #13 (11.29.21), #14 (12.29.21), #15 (02.02.22, sample), #16 (03.09.22), #17 (04.06.22), #18 (05.11.2012), #19 (06.15.22), #20 (7.20.22), #21 (08.24.22), #22 (10.4.22), #23 (11.8.22), #24 (12.13.22), #25 (01.17.23)  - s/p IVE OD #1 (11.13.20), #2 (12.11.20), #3 (01.18.21), #4 (02.19.21), #5 (03.19.21), #6 (04.16.21), #7 (05.14.21), #8 (06.17.21), #9 (07.16.21), #10 (09.21.21), #11 (10.29.21), #12 (11.29.21), #13 (12.29.21), #14 (03.09.22), #15 (04.06.22), #16 (05.11.12), #  17 (06.15.22), #18 (7.20.22), #19 (08.24.22), #20 (10.04.22), #21 (11.08.22), #22 (12.13.22)  - FA (06.26.20) shows Severe NPDR OU w/ Late leaking MA OU, Enlarged FAZ OU, No NV OU, No significant hyperfluorescence of disc  - BCVA OD 20/20; OS 20/40 -- improved  - OCT shows OD: mild inc in IRF/cystic changes, OS:  persistent IRF/IRHM greatest ST mac, persistent central ORA at 5 wks   - recommend IVE OU (OD #23 and OS #26), 02.21.23 -- Eylea approved until 03/05/2022 and has Eylea 4U  - pt wishes to proceed  - RBA of procedure discussed, questions answered  - informed consent obtained  - Eylea informed consent form signed and scanned on 01.18.2021  - see procedure note  - [email or web address] Benefits Investigation initiated 10.16.2020 -- approved for 2023  - f/u in 5 weeks-- DFE/OCT/possible injection(s)   2. Retinal Edema OU  - based on FA, suspect majority of macular edema due to DM as above, but there may be some edema secondary to chronic post-op CME  - cont PF qid OU and ketorolac qid OU   - history of difficulty with compliance at work  - monitor  - f/u in 5 weeks  3. Choroidal Nevus OS  - located at 1200, mild elevation, +drusen, no SRF  - baseline optos pictures obtained, 06.26.20  - discussed possible referral to oncology at [facility] in [place]  4,5.Hypertensive retinopathy OU  - discussed importance of tight BP control  - monitor  6. Pseudophakia OU  - s/p CE/IOL OU (OD on 06.03.20 and OS 06.10.20 by Dr. Gershon Crane)  - beautiful surgeries w/ IOLs in excellent position  - macular edema limiting vision as above  - monitor  7. Ocular hypertension OD  - IOP 15 OD  - cont Cosopt BID OD  - monitor  Ophthalmic Meds Ordered this visit:  Meds ordered this encounter  Medications   aflibercept (EYLEA) SOLN 2 mg   aflibercept (EYLEA) SOLN 2 mg     Return in about 5 weeks (around 04/28/2021) for f/u NPDR OU, DFE, OCT.  There are no Patient Instructions on file for this visit.  This document serves as a record of services personally performed by Gardiner Sleeper, MD, PhD. It was created on their behalf by San Jetty. Owens Shark, OA an ophthalmic technician. The creation of this record is the provider's dictation and/or activities during the visit.    Electronically signed by: San Jetty. Owens Shark, [place]  02.15.2023 12:30 AM  Gardiner Sleeper, M.D., Ph.D. Diseases & Surgery of the Retina and Vitreous [facility]  I have reviewed the above documentation for accuracy and completeness, and I agree with the above. Gardiner Sleeper, M.D., Ph.D. 03/25/21 12:31 AM   Abbreviations: M myopia (nearsighted); A astigmatism; H hyperopia (farsighted); P presbyopia; Mrx spectacle prescription;  CTL contact lenses; OD right eye; OS left eye; OU both eyes  XT exotropia; ET esotropia; PEK punctate epithelial keratitis; PEE punctate epithelial erosions; DES dry eye syndrome; MGD meibomian gland dysfunction; ATs artificial tears; PFAT's preservative free artificial tears; [place] nuclear sclerotic cataract; PSC posterior subcapsular cataract; ERM epi-retinal membrane; PVD posterior vitreous detachment; RD retinal detachment; DM diabetes mellitus; DR diabetic retinopathy; NPDR non-proliferative diabetic retinopathy; PDR proliferative diabetic retinopathy; CSME clinically significant macular edema; DME diabetic macular edema; dbh dot blot hemorrhages; CWS cotton wool spot; POAG primary open angle glaucoma; C/D cup-to-disc ratio; HVF humphrey visual field; GVF goldmann visual field; OCT optical coherence tomography; IOP intraocular pressure;  BRVO Branch retinal vein occlusion; CRVO central retinal vein occlusion; CRAO central retinal artery occlusion; BRAO branch retinal artery occlusion; RT retinal tear; SB scleral buckle; PPV pars plana vitrectomy; VH Vitreous hemorrhage; PRP panretinal laser photocoagulation; IVK intravitreal kenalog; VMT vitreomacular traction; MH Macular hole;  NVD neovascularization of the disc; NVE neovascularization elsewhere; AREDS age related eye disease study; ARMD age related macular degeneration; POAG primary open angle glaucoma; EBMD epithelial/anterior basement membrane dystrophy; ACIOL anterior chamber intraocular lens; IOL intraocular lens; PCIOL posterior chamber intraocular  lens; Phaco/IOL phacoemulsification with intraocular lens placement; [place] photorefractive keratectomy; LASIK laser assisted in situ keratomileusis; HTN hypertension; DM diabetes mellitus; COPD chronic obstructive pulmonary disease

## 2021-03-24 ENCOUNTER — Encounter (INDEPENDENT_AMBULATORY_CARE_PROVIDER_SITE_OTHER): Payer: Self-pay | Admitting: Ophthalmology

## 2021-03-24 ENCOUNTER — Ambulatory Visit (INDEPENDENT_AMBULATORY_CARE_PROVIDER_SITE_OTHER): Payer: BC Managed Care – PPO | Admitting: Ophthalmology

## 2021-03-24 ENCOUNTER — Other Ambulatory Visit: Payer: Self-pay

## 2021-03-24 DIAGNOSIS — H35033 Hypertensive retinopathy, bilateral: Secondary | ICD-10-CM | POA: Diagnosis not present

## 2021-03-24 DIAGNOSIS — E113413 Type 2 diabetes mellitus with severe nonproliferative diabetic retinopathy with macular edema, bilateral: Secondary | ICD-10-CM

## 2021-03-24 DIAGNOSIS — H40051 Ocular hypertension, right eye: Secondary | ICD-10-CM

## 2021-03-24 DIAGNOSIS — D3132 Benign neoplasm of left choroid: Secondary | ICD-10-CM

## 2021-03-24 DIAGNOSIS — Z961 Presence of intraocular lens: Secondary | ICD-10-CM

## 2021-03-24 DIAGNOSIS — I10 Essential (primary) hypertension: Secondary | ICD-10-CM

## 2021-03-24 DIAGNOSIS — H3581 Retinal edema: Secondary | ICD-10-CM

## 2021-03-25 ENCOUNTER — Encounter (INDEPENDENT_AMBULATORY_CARE_PROVIDER_SITE_OTHER): Payer: Self-pay | Admitting: Ophthalmology

## 2021-03-25 MED ORDER — AFLIBERCEPT 2MG/0.05ML IZ SOLN FOR KALEIDOSCOPE
2.0000 mg | INTRAVITREAL | Status: AC | PRN
  Administered 2021-03-25: 2 mg via INTRAVITREAL

## 2021-04-27 NOTE — Progress Notes (Signed)
?[facility] Note ? ?04/28/2021 ? ?  ? ?CHIEF COMPLAINT ?Patient presents for Retina Follow Up ? ? ?HISTORY OF PRESENT ILLNESS: ?Juan [name] is a 61 y.o. male who presents to the clinic today for:  ? ?HPI   ? ? Retina Follow Up   ?Patient presents with  Diabetic Retinopathy.  In both eyes.  This started 5 weeks ago.  I, the attending physician,  performed the HPI with the patient and updated documentation appropriately. ? ?  ?  ? ? Comments   ?Patient here for 5 weeks retina follow up for NPDR OU. Patient states vision seems to be a little weaker than it was a couple of weeks ago. No eye pain. ? ?  ?  ?Last edited by Bernarda Caffey, MD on 04/29/2021 12:40 AM.  ?  ?Pt states last week OD was weak and watering, vision was blurry, he thinks it may have been allergies ? ?Referring physician: ?Harlan Stains, MD ?[address] ?[address] ?[place],  [place] [address] ? ?HISTORICAL INFORMATION:  ? ?Selected notes from the [facility] ?Referred by Dr. Rutherford Guys for concern of CME s/p cataract sx  ? ?CURRENT MEDICATIONS: ?Current Outpatient Medications (Ophthalmic Drugs)  ?Medication Sig  ? dorzolamide-timolol (COSOPT) 22.3-6.8 MG/ML ophthalmic solution Place 1 drop into the right eye 2 (two) times daily. (Patient taking differently: Place 1 drop into both eyes 2 (two) times daily.)  ? ?No current facility-administered medications for this visit. (Ophthalmic Drugs)  ? ?Current Outpatient Medications (Other)  ?Medication Sig  ? ADVAIR DISKUS 500-50 MCG/DOSE AEPB Inhale 1 puff into the lungs 2 (two) times daily.  ? albuterol (VENTOLIN HFA) 108 (90 Base) MCG/ACT inhaler Inhale 1-2 puffs into the lungs every 6 (six) hours as needed for wheezing or shortness of breath.  ? alfuzosin (UROXATRAL) 10 MG 24 hr tablet TAKE 1 TABLET BY MOUTH EVERYDAY AT BEDTIME  ? aspirin EC 81 MG tablet Take 81 mg by mouth daily.  ? atorvastatin (LIPITOR) 40 MG tablet Take 40 mg by mouth daily.  ? carvedilol  (COREG) 6.25 MG tablet TAKE 1 TABLET BY MOUTH TWICE A DAY WITH A MEAL  ? Empagliflozin-metFORMIN HCl (SYNJARDY) 12.06-998 MG TABS Take 1 tablet by mouth 2 (two) times a day.   ? esomeprazole (NEXIUM) 40 MG capsule Take 40 mg by mouth daily at 12 noon.  ? famotidine (PEPCID) 40 MG tablet Take by mouth.  ? insulin glargine, 2 Unit Dial, (TOUJEO MAX SOLOSTAR) 300 UNIT/ML Solostar Pen Inject 40 Units into the skin daily.   ? iron polysaccharides (NIFEREX) 150 MG capsule Take 150 mg by mouth daily.  ? isosorbide mononitrate (IMDUR) 30 MG 24 hr tablet Take 30 mg by mouth daily.  ? lisinopril (ZESTRIL) 40 MG tablet Take 40 mg by mouth daily.  ? nitroGLYCERIN (NITROSTAT) 0.4 MG SL tablet Place 0.4 mg under the tongue daily.  ? nitroGLYCERIN (NITROSTAT) 0.4 MG SL tablet Place under the tongue.  ? pantoprazole (PROTONIX) 40 MG tablet Take 40 mg by mouth daily.  ? Semaglutide (OZEMPIC, 0.25 OR 0.5 MG/DOSE, [place]) Inject into the skin.  ? ?No current facility-administered medications for this visit. (Other)  ? ?REVIEW OF SYSTEMS: ?ROS   ?Positive for: Gastrointestinal, Musculoskeletal, Endocrine, Eyes, Respiratory ?Negative for: Constitutional, Neurological, Skin, Genitourinary, HENT, Cardiovascular, Psychiatric, Allergic/Imm, Heme/Lymph ?Last edited by Theodore Demark, COA on 04/28/2021  3:04 PM.  ?  ? ?ALLERGIES ?Allergies  ?Allergen Reactions  ? Naproxen  Shortness Of Breath  ? Naproxen Sodium Shortness Of Breath  ? Tiotropium Bromide Monohydrate Shortness Of Breath  ? Penicillins Rash  ?  Has patient had a PCN reaction causing immediate rash, facial/tongue/throat swelling, SOB or lightheadedness with hypotension: Yes ?Has patient had a PCN reaction causing severe rash involving mucus membranes or skin necrosis: No ?Has patient had a PCN reaction that required hospitalization No ?Has patient had a PCN reaction occurring within the last 10 years: No ?If all of the above answers are "NO", then may proceed with Cephalosporin  use. ? ?Has patient had a PCN reaction causing immediate rash, facial/tongue/throat swelling, SOB or lightheadedness with hypotension: Yes ?Has patient had a PCN reaction causing severe rash involving mucus membranes or skin necrosis: No ?Has patient had a PCN reaction that required hospitalization No ?Has patient had a PCN reaction occurring within the last 10 years: No ?If all of the above answers are "NO", then may proceed with Cephalosporin use. ?  ? ?PAST MEDICAL HISTORY ?Past Medical History:  ?Diagnosis Date  ? Arthritis   ? Asthma   ? COPD (chronic obstructive pulmonary disease) ([place])   ? Diabetes mellitus   ? Type 2  ? Diabetic retinopathy ([place])   ? NPDR OU  ? GERD (gastroesophageal reflux disease)   ? H/O hiatal hernia   ? History of kidney stones   ? Hyperlipemia   ? Hypertension   ? Hypertensive retinopathy   ? OU  ? Nasal polyps   ? Pneumonia 2014  ? Shortness of breath   ? Sleep apnea   ? pt has CPAP but is unable to use it d/t having polyps in his nose. Pt stated "I need to call and get a CPAP mask instead"  ? Stomach ulcer   ? ?Past Surgical History:  ?Procedure Laterality Date  ? CATARACT EXTRACTION Right 07/05/2018  ? Dr. Gershon Crane  ? CATARACT EXTRACTION Left 07/12/2018  ? Dr. Gershon Crane  ? CHOLECYSTECTOMY N/A 01/23/2015  ? Procedure: LAPAROSCOPIC CHOLECYSTECTOMY ;  Surgeon: Georganna Skeans, MD;  Location: [facility];  Service: General;  Laterality: N/A;  ? COLONOSCOPY W/ POLYPECTOMY    ? ESOPHAGOGASTRODUODENOSCOPY    ? EYE SURGERY    ? KNEE ARTHROSCOPY  03/24/2011  ? Procedure: ARTHROSCOPY KNEE;  Surgeon: Alta Corning, MD;  Location: [facility];  Service: Orthopedics;  Laterality: Right;  partial medial menisectomy, chondroplaty patella, and medial medial plica  ? LEFT HEART CATH AND CORONARY ANGIOGRAPHY N/A 06/29/2019  ? Procedure: LEFT HEART CATH AND CORONARY ANGIOGRAPHY;  Surgeon: Wellington Hampshire, MD;  Location: [facility];  Service: Cardiovascular;  Laterality: N/A;  ? ?FAMILY  HISTORY ?Family History  ?Problem Relation Age of Onset  ? Diabetes Father   ? Coronary artery disease Father   ? ?SOCIAL HISTORY ?Social History  ? ?Tobacco Use  ? Smoking status: Former  ?  Packs/day: 3.00  ?  Years: 20.00  ?  Pack years: 60.00  ?  Types: Cigarettes  ? Smokeless tobacco: Current  ?  Types: Chew  ? Tobacco comments:  ?  1 can/daily  ?Vaping Use  ? Vaping Use: Never used  ?Substance Use Topics  ? Alcohol use: Yes  ?  Comment: social, 1x weekly  ? Drug use: No  ?  ? ?  ?OPHTHALMIC EXAM: ? ?Base Eye Exam   ? ? Visual Acuity (Snellen - Linear)   ? ?   Right Left  ? Dist [place] 20/20 -2 20/50 -  1  ? Dist ph [place]  20/40  ? ? Correction: Glasses  ? ?  ?  ? ? Tonometry (Tonopen, 3:02 PM)   ? ?   Right Left  ? Pressure 15 18  ? ?  ?  ? ? Pupils   ? ?   Dark Light Shape React APD  ? Right 3 2 Round Brisk None  ? Left 3 2 Round Brisk None  ? ?  ?  ? ? Visual Fields (Counting fingers)   ? ?   Left Right  ?   Full  ? Restrictions Partial inner superior temporal deficiency   ? ?  ?  ? ? Extraocular Movement   ? ?   Right Left  ?  Full, Ortho Full, Ortho  ? ?  ?  ? ? Neuro/Psych   ? ? Oriented x3: Yes  ? Mood/Affect: Normal  ? ?  ?  ? ? Dilation   ? ? Both eyes: 1.0% Mydriacyl, 2.5% Phenylephrine @ 3:02 PM  ? ?  ?  ? ?  ? ?Slit Lamp and Fundus Exam   ? ? Slit Lamp Exam   ? ?   Right Left  ? Lids/Lashes Dermatochalasis - upper lid, Telangiectasia, mild Meibomian gland dysfunction Dermatochalasis - upper lid, Telangiectasia, mild Meibomian gland dysfunction  ? Conjunctiva/Sclera White and quiet Mild nasal Pinguecula  ? Cornea Mild Arcus, Well healed temporal cataract wounds Arcus, Well healed temporal cataract wounds  ? Anterior Chamber Deep and clear Deep and clear  ? Iris Round and dilated, mild patch of atrophy at 0800, no NVI Round and dilated, No NVI  ? Lens PC IOL in good position PC IOL in good position  ? Anterior Vitreous Vitreous syneresis Vitreous syneresis  ? ?  ?  ? ? Fundus Exam   ? ?   Right Left  ? Disc  Pink and Sharp Pallor, Sharp rim, Compact  ? C/D Ratio 0.3 0.3  ? Macula Good foveal reflex, scattered IRH/DBH/exudates -- greatest temporal macula - improved, trace residual cystic changes remain -- slightly in

## 2021-04-28 ENCOUNTER — Encounter (INDEPENDENT_AMBULATORY_CARE_PROVIDER_SITE_OTHER): Payer: Self-pay | Admitting: Ophthalmology

## 2021-04-28 ENCOUNTER — Other Ambulatory Visit: Payer: Self-pay

## 2021-04-28 ENCOUNTER — Ambulatory Visit (INDEPENDENT_AMBULATORY_CARE_PROVIDER_SITE_OTHER): Payer: BC Managed Care – PPO | Admitting: Ophthalmology

## 2021-04-28 DIAGNOSIS — H3581 Retinal edema: Secondary | ICD-10-CM

## 2021-04-28 DIAGNOSIS — I10 Essential (primary) hypertension: Secondary | ICD-10-CM | POA: Diagnosis not present

## 2021-04-28 DIAGNOSIS — H40051 Ocular hypertension, right eye: Secondary | ICD-10-CM

## 2021-04-28 DIAGNOSIS — Z961 Presence of intraocular lens: Secondary | ICD-10-CM

## 2021-04-28 DIAGNOSIS — E113413 Type 2 diabetes mellitus with severe nonproliferative diabetic retinopathy with macular edema, bilateral: Secondary | ICD-10-CM | POA: Diagnosis not present

## 2021-04-28 DIAGNOSIS — H35033 Hypertensive retinopathy, bilateral: Secondary | ICD-10-CM

## 2021-04-28 DIAGNOSIS — D3132 Benign neoplasm of left choroid: Secondary | ICD-10-CM | POA: Diagnosis not present

## 2021-04-29 ENCOUNTER — Encounter (INDEPENDENT_AMBULATORY_CARE_PROVIDER_SITE_OTHER): Payer: Self-pay | Admitting: Ophthalmology

## 2021-04-29 MED ORDER — AFLIBERCEPT 2MG/0.05ML IZ SOLN FOR KALEIDOSCOPE
2.0000 mg | INTRAVITREAL | Status: AC | PRN
  Administered 2021-04-29: 2 mg via INTRAVITREAL

## 2021-04-30 DIAGNOSIS — H7292 Unspecified perforation of tympanic membrane, left ear: Secondary | ICD-10-CM | POA: Diagnosis not present

## 2021-04-30 DIAGNOSIS — J029 Acute pharyngitis, unspecified: Secondary | ICD-10-CM | POA: Diagnosis not present

## 2021-04-30 DIAGNOSIS — H6122 Impacted cerumen, left ear: Secondary | ICD-10-CM | POA: Diagnosis not present

## 2021-05-27 NOTE — Progress Notes (Addendum)
?[facility] Note ? ?06/09/2021 ? ?  ? ?CHIEF COMPLAINT ?Patient presents for Retina Follow Up ? ? ?HISTORY OF PRESENT ILLNESS: ?Juan [name] is a 61 y.o. male who presents to the clinic today for:  ? ?HPI   ? ? Retina Follow Up   ?Patient presents with  Diabetic Retinopathy.  In both eyes.  This started 6 weeks ago.  I, the attending physician,  performed the HPI with the patient and updated documentation appropriately. ? ?  ?  ? ? Comments   ?Patient here for 6 weeks retina follow up for NPDR OU. Patient states vision about the same. Noticed last Friday (4 days ago) when [place] the [place] had a lot of floaters OU. No eye pain.  ? ?  ?  ?Last edited by Bernarda Caffey, MD on 06/09/2021  3:05 PM.  ?  ? ?Pt states vision is stable, he was playing golf last Friday and noticed multiple floaters in both eyes, he says this is the first time he has noticed multiple floaters, he has only seen one or two since then, pt is using PF and ketorolac BID OU, cosopt once a day ? ? ?Referring physician: ?Harlan Stains, MD ?[address] ?[address] ?[place],  [place] [address] ? ?HISTORICAL INFORMATION:  ? ?Selected notes from the [facility] ?Referred by Dr. Rutherford Guys for concern of CME s/p cataract sx  ? ?CURRENT MEDICATIONS: ?Current Outpatient Medications (Ophthalmic Drugs)  ?Medication Sig  ? dorzolamide-timolol (COSOPT) 22.3-6.8 MG/ML ophthalmic solution Place 1 drop into the right eye 2 (two) times daily. (Patient taking differently: Place 1 drop into both eyes 2 (two) times daily.)  ? ?No current facility-administered medications for this visit. (Ophthalmic Drugs)  ? ?Current Outpatient Medications (Other)  ?Medication Sig  ? ADVAIR DISKUS 500-50 MCG/DOSE AEPB Inhale 1 puff into the lungs 2 (two) times daily.  ? albuterol (VENTOLIN HFA) 108 (90 Base) MCG/ACT inhaler Inhale 1-2 puffs into the lungs every 6 (six) hours as needed for wheezing or shortness of breath.  ? alfuzosin  (UROXATRAL) 10 MG 24 hr tablet TAKE 1 TABLET BY MOUTH EVERYDAY AT BEDTIME  ? aspirin EC 81 MG tablet Take 81 mg by mouth daily.  ? atorvastatin (LIPITOR) 40 MG tablet Take 40 mg by mouth daily.  ? carvedilol (COREG) 6.25 MG tablet TAKE 1 TABLET BY MOUTH TWICE A DAY WITH A MEAL  ? Empagliflozin-metFORMIN HCl (SYNJARDY) 12.06-998 MG TABS Take 1 tablet by mouth 2 (two) times a day.   ? esomeprazole (NEXIUM) 40 MG capsule Take 40 mg by mouth daily at 12 noon.  ? famotidine (PEPCID) 40 MG tablet Take by mouth.  ? insulin glargine, 2 Unit Dial, (TOUJEO MAX SOLOSTAR) 300 UNIT/ML Solostar Pen Inject 40 Units into the skin daily.   ? iron polysaccharides (NIFEREX) 150 MG capsule Take 150 mg by mouth daily.  ? isosorbide mononitrate (IMDUR) 30 MG 24 hr tablet Take 30 mg by mouth daily.  ? lisinopril (ZESTRIL) 40 MG tablet Take 40 mg by mouth daily.  ? nitroGLYCERIN (NITROSTAT) 0.4 MG SL tablet Place 0.4 mg under the tongue daily.  ? nitroGLYCERIN (NITROSTAT) 0.4 MG SL tablet Place under the tongue.  ? pantoprazole (PROTONIX) 40 MG tablet Take 40 mg by mouth daily.  ? Semaglutide (OZEMPIC, 0.25 OR 0.5 MG/DOSE, [place]) Inject into the skin.  ? ?No current facility-administered medications for this visit. (Other)  ? ?REVIEW OF SYSTEMS: ?ROS   ?  Positive for: Gastrointestinal, Musculoskeletal, Endocrine, Eyes, Respiratory ?Negative for: Constitutional, Neurological, Skin, Genitourinary, HENT, Cardiovascular, Psychiatric, Allergic/Imm, Heme/Lymph ?Last edited by Theodore Demark, COA on 06/09/2021  2:10 PM.  ?  ? ? ?ALLERGIES ?Allergies  ?Allergen Reactions  ? Naproxen Shortness Of Breath  ? Naproxen Sodium Shortness Of Breath  ? Tiotropium Bromide Monohydrate Shortness Of Breath  ? Penicillins Rash  ?  Has patient had a PCN reaction causing immediate rash, facial/tongue/throat swelling, SOB or lightheadedness with hypotension: Yes ?Has patient had a PCN reaction causing severe rash involving mucus membranes or skin necrosis: No ?Has  patient had a PCN reaction that required hospitalization No ?Has patient had a PCN reaction occurring within the last 10 years: No ?If all of the above answers are "NO", then may proceed with Cephalosporin use. ? ?Has patient had a PCN reaction causing immediate rash, facial/tongue/throat swelling, SOB or lightheadedness with hypotension: Yes ?Has patient had a PCN reaction causing severe rash involving mucus membranes or skin necrosis: No ?Has patient had a PCN reaction that required hospitalization No ?Has patient had a PCN reaction occurring within the last 10 years: No ?If all of the above answers are "NO", then may proceed with Cephalosporin use. ?  ? ?PAST MEDICAL HISTORY ?Past Medical History:  ?Diagnosis Date  ? Arthritis   ? Asthma   ? COPD (chronic obstructive pulmonary disease) ([place])   ? Diabetes mellitus   ? Type 2  ? Diabetic retinopathy ([place])   ? NPDR OU  ? GERD (gastroesophageal reflux disease)   ? H/O hiatal hernia   ? History of kidney stones   ? Hyperlipemia   ? Hypertension   ? Hypertensive retinopathy   ? OU  ? Nasal polyps   ? Pneumonia 2014  ? Shortness of breath   ? Sleep apnea   ? pt has CPAP but is unable to use it d/t having polyps in his nose. Pt stated "I need to call and get a CPAP mask instead"  ? Stomach ulcer   ? ?Past Surgical History:  ?Procedure Laterality Date  ? CATARACT EXTRACTION Right 07/05/2018  ? Dr. Gershon Crane  ? CATARACT EXTRACTION Left 07/12/2018  ? Dr. Gershon Crane  ? CHOLECYSTECTOMY N/A 01/23/2015  ? Procedure: LAPAROSCOPIC CHOLECYSTECTOMY ;  Surgeon: Georganna Skeans, MD;  Location: [facility];  Service: General;  Laterality: N/A;  ? COLONOSCOPY W/ POLYPECTOMY    ? ESOPHAGOGASTRODUODENOSCOPY    ? EYE SURGERY    ? KNEE ARTHROSCOPY  03/24/2011  ? Procedure: ARTHROSCOPY KNEE;  Surgeon: Alta Corning, MD;  Location: [facility];  Service: Orthopedics;  Laterality: Right;  partial medial menisectomy, chondroplaty patella, and medial medial plica  ? LEFT HEART CATH AND  CORONARY ANGIOGRAPHY N/A 06/29/2019  ? Procedure: LEFT HEART CATH AND CORONARY ANGIOGRAPHY;  Surgeon: Wellington Hampshire, MD;  Location: [facility];  Service: Cardiovascular;  Laterality: N/A;  ? ?FAMILY HISTORY ?Family History  ?Problem Relation Age of Onset  ? Diabetes Father   ? Coronary artery disease Father   ? ?SOCIAL HISTORY ?Social History  ? ?Tobacco Use  ? Smoking status: Former  ?  Packs/day: 3.00  ?  Years: 20.00  ?  Pack years: 60.00  ?  Types: Cigarettes  ? Smokeless tobacco: Current  ?  Types: Chew  ? Tobacco comments:  ?  1 can/daily  ?Vaping Use  ? Vaping Use: Never used  ?Substance Use Topics  ? Alcohol use: Yes  ?  Comment: social,  1x weekly  ? Drug use: No  ?  ? ?  ?OPHTHALMIC EXAM: ? ?Base Eye Exam   ? ? Visual Acuity (Snellen - Linear)   ? ?   Right Left  ? Dist [place] 20/20 -2 20/50 -1  ? Dist ph [place]  20/40  ? ?  ?  ? ? Tonometry (Tonopen, 2:07 PM)   ? ?   Right Left  ? Pressure 20 21  ? ?  ?  ? ? Pupils   ? ?   Dark Light Shape React APD  ? Right 3 2 Round Brisk None  ? Left 3 2 Round Brisk None  ? ?  ?  ? ? Visual Fields (Counting fingers)   ? ?   Left Right  ?   Full  ? Restrictions Partial inner superior temporal deficiency   ? ?  ?  ? ? Extraocular Movement   ? ?   Right Left  ?  Full, Ortho Full, Ortho  ? ?  ?  ? ? Neuro/Psych   ? ? Oriented x3: Yes  ? Mood/Affect: Normal  ? ?  ?  ? ? Dilation   ? ? Both eyes: 1.0% Mydriacyl, 2.5% Phenylephrine @ 2:07 PM  ? ?  ?  ? ?  ? ?Slit Lamp and Fundus Exam   ? ? Slit Lamp Exam   ? ?   Right Left  ? Lids/Lashes Dermatochalasis - upper lid, Telangiectasia, mild Meibomian gland dysfunction Dermatochalasis - upper lid, Telangiectasia, mild Meibomian gland dysfunction  ? Conjunctiva/Sclera White and quiet Mild nasal Pinguecula  ? Cornea Mild Arcus, Well healed temporal cataract wounds Arcus, Well healed temporal cataract wounds  ? Anterior Chamber Deep and clear Deep and clear  ? Iris Round and dilated, mild patch of atrophy at 0800, no NVI Round and  dilated, No NVI  ? Lens PC IOL in good position PC IOL in good position  ? Anterior Vitreous Vitreous syneresis Vitreous syneresis  ? ?  ?  ? ? Fundus Exam   ? ?   Right Left  ? Disc Pink and Sharp Pallor, Sharp rim,

## 2021-05-30 ENCOUNTER — Other Ambulatory Visit: Payer: Self-pay | Admitting: Internal Medicine

## 2021-06-09 ENCOUNTER — Ambulatory Visit (INDEPENDENT_AMBULATORY_CARE_PROVIDER_SITE_OTHER): Payer: BC Managed Care – PPO | Admitting: Ophthalmology

## 2021-06-09 ENCOUNTER — Encounter (INDEPENDENT_AMBULATORY_CARE_PROVIDER_SITE_OTHER): Payer: Self-pay | Admitting: Ophthalmology

## 2021-06-09 DIAGNOSIS — H35033 Hypertensive retinopathy, bilateral: Secondary | ICD-10-CM | POA: Diagnosis not present

## 2021-06-09 DIAGNOSIS — D3132 Benign neoplasm of left choroid: Secondary | ICD-10-CM

## 2021-06-09 DIAGNOSIS — Z961 Presence of intraocular lens: Secondary | ICD-10-CM

## 2021-06-09 DIAGNOSIS — I10 Essential (primary) hypertension: Secondary | ICD-10-CM

## 2021-06-09 DIAGNOSIS — E113413 Type 2 diabetes mellitus with severe nonproliferative diabetic retinopathy with macular edema, bilateral: Secondary | ICD-10-CM | POA: Diagnosis not present

## 2021-06-09 DIAGNOSIS — H40051 Ocular hypertension, right eye: Secondary | ICD-10-CM

## 2021-06-09 DIAGNOSIS — H3581 Retinal edema: Secondary | ICD-10-CM

## 2021-06-09 MED ORDER — AFLIBERCEPT 2MG/0.05ML IZ SOLN FOR KALEIDOSCOPE
2.0000 mg | INTRAVITREAL | Status: AC | PRN
  Administered 2021-06-09: 2 mg via INTRAVITREAL

## 2021-06-19 ENCOUNTER — Ambulatory Visit: Payer: BC Managed Care – PPO | Admitting: Internal Medicine

## 2021-06-22 NOTE — Progress Notes (Unsigned)
Office Visit    Patient Name: Juan [name] Date of Encounter: 06/23/2021  Primary Care Provider:  Harlan Stains, MD Primary Cardiologist:  Elouise Munroe, MD  Chief Complaint    61 year old male with a history of CAD, aortic atherosclerosis, hypertension, hyperlipidemia, OSA, type 2 diabetes, COPD, and GERD who presents for follow-up related to CAD.  Past Medical History    Past Medical History:  Diagnosis Date   Arthritis    Asthma    COPD (chronic obstructive pulmonary disease) ([place])    Diabetes mellitus    Type 2   Diabetic retinopathy (HCC)    NPDR OU   GERD (gastroesophageal reflux disease)    H/O hiatal hernia    History of kidney stones    Hyperlipemia    Hypertension    Hypertensive retinopathy    OU   Nasal polyps    Pneumonia 2014   Shortness of breath    Sleep apnea    pt has CPAP but is unable to use it d/t having polyps in his nose. Pt stated "I need to call and get a CPAP mask instead"   Stomach ulcer    Past Surgical History:  Procedure Laterality Date   CATARACT EXTRACTION Right 07/05/2018   Dr. Gershon Crane   CATARACT EXTRACTION Left 07/12/2018   Dr. Gershon Crane   CHOLECYSTECTOMY N/A 01/23/2015   Procedure: LAPAROSCOPIC CHOLECYSTECTOMY ;  Surgeon: Georganna Skeans, MD;  Location: [facility];  Service: General;  Laterality: N/A;   COLONOSCOPY W/ POLYPECTOMY     ESOPHAGOGASTRODUODENOSCOPY     EYE SURGERY     KNEE ARTHROSCOPY  03/24/2011   Procedure: ARTHROSCOPY KNEE;  Surgeon: Alta Corning, MD;  Location: [facility];  Service: Orthopedics;  Laterality: Right;  partial medial menisectomy, chondroplaty patella, and medial medial plica   LEFT HEART CATH AND CORONARY ANGIOGRAPHY N/A 06/29/2019   Procedure: LEFT HEART CATH AND CORONARY ANGIOGRAPHY;  Surgeon: Wellington Hampshire, MD;  Location: [facility];  Service: Cardiovascular;  Laterality: N/A;    Allergies  Allergies  Allergen Reactions   Naproxen Shortness Of Breath    Naproxen Sodium Shortness Of Breath   Tiotropium Bromide Monohydrate Shortness Of Breath   Penicillins Rash    Has patient had a PCN reaction causing immediate rash, facial/tongue/throat swelling, SOB or lightheadedness with hypotension: Yes Has patient had a PCN reaction causing severe rash involving mucus membranes or skin necrosis: No Has patient had a PCN reaction that required hospitalization No Has patient had a PCN reaction occurring within the last 10 years: No If all of the above answers are "NO", then may proceed with Cephalosporin use.  Has patient had a PCN reaction causing immediate rash, facial/tongue/throat swelling, SOB or lightheadedness with hypotension: Yes Has patient had a PCN reaction causing severe rash involving mucus membranes or skin necrosis: No Has patient had a PCN reaction that required hospitalization No Has patient had a PCN reaction occurring within the last 10 years: No If all of the above answers are "NO", then may proceed with Cephalosporin use.     History of Present Illness    61 year old male with the above past medical history including CAD, aortic atherosclerosis, hypertension, hyperlipidemia, OSA, type 2 diabetes, COPD, and GERD.  He had apical ischemia on a stress test in May 2021 in the setting of exertional chest tightness. Cardiac catheterization in May 2021 showed moderately to severely calcified coronary arteries with significant one-vessel CAD affecting the distal left  circumflex supplying OM 3, moderate stenosis in the distal RCA into the right PDA, normal LVEF with mildly elevated LVEDP. Medical management was recommended.  He presented to the ED in January 2023 with complaints of chest pain, bilateral lower extremity edema, shortness of breath.  EKG was unremarkable, repeat echocardiogram showed EF 55 to 60%, no LVH, no RWMA, normal diastolic function, mild TR. He was last seen in the office on 02/09/2021 and was stable overall from a cardiac  standpoint.  He denied symptoms concerning for angina.  He presents today for follow-up. Since his last visit has been stable from a cardiac standpoint. He denies any symptoms concerning for angina, denies dyspnea. He does note occasional fleeting palpitations. He states he drinks significant amounts of [facility]. Other than his occasional palpitations, he denies any additional concerns today.  Home Medications    Current Outpatient Medications  Medication Sig Dispense Refill   ADVAIR DISKUS 500-50 MCG/DOSE AEPB Inhale 1 puff into the lungs 2 (two) times daily.     albuterol (VENTOLIN HFA) 108 (90 Base) MCG/ACT inhaler Inhale 1-2 puffs into the lungs every 6 (six) hours as needed for wheezing or shortness of breath. 18 g 0   alfuzosin (UROXATRAL) 10 MG 24 hr tablet TAKE 1 TABLET BY MOUTH EVERYDAY AT BEDTIME 90 tablet 3   aspirin EC 81 MG tablet Take 81 mg by mouth daily.     atorvastatin (LIPITOR) 40 MG tablet Take 40 mg by mouth daily.     carvedilol (COREG) 6.25 MG tablet TAKE 1 TABLET BY MOUTH TWICE A DAY WITH A MEAL 90 tablet 1   dorzolamide-timolol (COSOPT) 22.3-6.8 MG/ML ophthalmic solution Place 1 drop into the right eye 2 (two) times daily. (Patient taking differently: Place 1 drop into both eyes 2 (two) times daily.) 10 mL 10   Empagliflozin-metFORMIN HCl (SYNJARDY) 12.06-998 MG TABS Take 1 tablet by mouth 2 (two) times a day.      esomeprazole (NEXIUM) 40 MG capsule Take 40 mg by mouth daily at 12 noon.     famotidine (PEPCID) 40 MG tablet Take by mouth.     insulin glargine, 2 Unit Dial, (TOUJEO MAX SOLOSTAR) 300 UNIT/ML Solostar Pen Inject 40 Units into the skin daily.      iron polysaccharides (NIFEREX) 150 MG capsule Take 150 mg by mouth daily.     isosorbide mononitrate (IMDUR) 30 MG 24 hr tablet Take 30 mg by mouth daily.     lisinopril (ZESTRIL) 40 MG tablet Take 40 mg by mouth daily.     nitroGLYCERIN (NITROSTAT) 0.4 MG SL tablet Place 0.4 mg under the tongue daily.      No current facility-administered medications for this visit.     Review of Systems    He denies chest pain, dyspnea, pnd, orthopnea, n, v, dizziness, syncope, edema, weight gain, or early satiety. All other systems reviewed and are otherwise negative except as noted above.   Physical Exam    VS:  BP 132/78   Pulse 89   Ht '5\' 9"'$  (1.753 m)   Wt 233 lb 6.4 oz (105.9 kg)   SpO2 98%   BMI 34.47 kg/m   GEN: Well nourished, well developed, in no acute distress. HEENT: normal. Neck: Supple, no JVD, carotid bruits, or masses. Cardiac: RRR, no murmurs, rubs, or gallops. No clubbing, cyanosis, edema.  Radials/DP/PT 2+ and equal bilaterally.  Respiratory:  Respirations regular and unlabored, clear to auscultation bilaterally. GI: Soft, nontender, nondistended, BS + x 4. MS:  no deformity or atrophy. Skin: warm and dry, no rash. Neuro:  Strength and sensation are intact. Psych: Normal affect.  Accessory Clinical Findings    ECG personally reviewed by me today -Sinus rhythm with PVCs, 89 bpm- no acute changes.  Lab Results  Component Value Date   WBC 4.8 06/26/2019   HGB 10.5 (L) 06/26/2019   HCT 33.1 (L) 06/26/2019   MCV 80 06/26/2019   PLT 134 (L) 06/26/2019   Lab Results  Component Value Date   CREATININE 1.33 (H) 06/26/2019   BUN 21 06/26/2019   NA 140 06/26/2019   K 4.8 06/26/2019   CL 103 06/26/2019   CO2 22 06/26/2019   Lab Results  Component Value Date   ALT 49 01/28/2017   AST 41 01/28/2017   ALKPHOS 69 01/28/2017   BILITOT 1.6 (H) 01/28/2017   Lab Results  Component Value Date   CHOL  03/05/2010    90        ATP III CLASSIFICATION:  <200     mg/dL   Desirable  200-239  mg/dL   Borderline High  >=240    mg/dL   High          HDL 32 (L) 03/05/2010   LDLCALC  03/05/2010    45        Total Cholesterol/HDL:CHD Risk Coronary Heart Disease Risk Table                     Men   Women  1/2 Average Risk   3.4   3.3  Average Risk       5.0   4.4  2 X Average  Risk   9.6   7.1  3 X Average Risk  23.4   11.0        Use the calculated Patient Ratio above and the CHD Risk Table to determine the patient's CHD Risk.        ATP III CLASSIFICATION (LDL):  <100     mg/dL   Optimal  100-129  mg/dL   Near or Above                    Optimal  130-159  mg/dL   Borderline  160-189  mg/dL   High  >190     mg/dL   Very High   TRIG 66 03/05/2010   CHOLHDL 2.8 03/05/2010    Lab Results  Component Value Date   HGBA1C 7.6 (H) 01/21/2015    Assessment & Plan    1. CAD: Cath in May 2021 showed moderately to severely calcified coronary arteries with significant one-vessel CAD affecting the distal left circumflex supplying OM 3, moderate stenosis in the distal RCA into the right PDA, normal LVEF with mildly elevated LVEDP. Medical management was recommended.  He did have an ED visit in January 2023 with complaints of chest pain, shortness of breath, lower extremity edema. EKG was unremarkable, repeat echocardiogram showed EF 55 to 60%, no LVH, no RWMA, normal diastolic function, mild TR. Stable with no anginal symptoms. No indication for ischemic evaluation. Euvolemic and well compensated on exam. Per cath report, should he have recurrent angina, could consider PCI to circumflex.  Discussed ED precautions, warning symptoms.  Continue aspirin, carvedilol, lisinopril, Imdur, and Lipitor.   2. 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  He notes occasional fleeting palpitations, otherwise asymptomatic. Continue to monitor.  If he becomes symptomatic, could consider outpatient cardiac monitor.  Discussed triggers.  He states he does drink a significant amount of [employer] each day.  Encourage reduction in caffeine intake.  Discussed possibility of increasing carvedilol, however, patient declines at this time. Continue carvedilol at current dose.   3. Hypertension: BP well controlled. Continue current antihypertensive regimen.   4. Hyperlipidemia: LDL was 68 in January  2023.  Continue aspirin, Lipitor.  5. Type 2 diabetes: A1C was 8.9 in January 2023.  Monitored and managed per PCP. Encouraged ongoing lifestyle modifications with diet and exercise.   6. OSA: He does not use a CPAP.  He does report restful sleep.  Advised him to follow-up if he begins to notice increased daytime fatigue, other symptoms concerning for uncontrolled sleep apnea.  7. Disposition: Follow-up in 4-5 months, sooner if needed.   Lenna Sciara, NP 06/23/2021, 10:36 AM

## 2021-06-23 ENCOUNTER — Encounter: Payer: Self-pay | Admitting: Nurse Practitioner

## 2021-06-23 ENCOUNTER — Ambulatory Visit (INDEPENDENT_AMBULATORY_CARE_PROVIDER_SITE_OTHER): Payer: BC Managed Care – PPO | Admitting: Nurse Practitioner

## 2021-06-23 VITALS — BP 132/78 | HR 89 | Ht 69.0 in | Wt 233.4 lb

## 2021-06-23 DIAGNOSIS — E785 Hyperlipidemia, unspecified: Secondary | ICD-10-CM | POA: Diagnosis not present

## 2021-06-23 DIAGNOSIS — E118 Type 2 diabetes mellitus with unspecified complications: Secondary | ICD-10-CM

## 2021-06-23 DIAGNOSIS — I493 Ventricular premature depolarization: Secondary | ICD-10-CM | POA: Diagnosis not present

## 2021-06-23 DIAGNOSIS — I2510 Atherosclerotic heart disease of native coronary artery without angina pectoris: Secondary | ICD-10-CM | POA: Diagnosis not present

## 2021-06-23 DIAGNOSIS — G4733 Obstructive sleep apnea (adult) (pediatric): Secondary | ICD-10-CM

## 2021-06-23 DIAGNOSIS — Z794 Long term (current) use of insulin: Secondary | ICD-10-CM

## 2021-06-23 DIAGNOSIS — I10 Essential (primary) hypertension: Secondary | ICD-10-CM | POA: Diagnosis not present

## 2021-06-23 NOTE — Patient Instructions (Signed)
Medication Instructions:  Your physician recommends that you continue on your current medications as directed. Please refer to the Current Medication list given to you today.   *If you need a refill on your cardiac medications before your next appointment, please call your pharmacy*   Lab Work: NONE ordered at this time of appointment   If you have labs (blood work) drawn today and your tests are completely normal, you will receive your results only by: [place] (if you have MyChart) OR A paper copy in the mail If you have any lab test that is abnormal or we need to change your treatment, we will call you to review the results.   Testing/Procedures: NONE ordered at this time of appointment     Follow-Up: At [facility], you and your health needs are our priority.  As part of our continuing mission to provide you with exceptional heart care, we have created designated Provider Care Teams.  These Care Teams include your primary Cardiologist (physician) and Advanced Practice Providers (APPs -  Physician Assistants and Nurse Practitioners) who all work together to provide you with the care you need, when you need it.  We recommend signing up for the patient portal called "MyChart".  Sign up information is provided on this After Visit Summary.  MyChart is used to connect with patients for Virtual Visits (Telemedicine).  Patients are able to view lab/test results, encounter notes, upcoming appointments, etc.  Non-urgent messages can be sent to your provider as well.   To learn more about what you can do with MyChart, go to [email or web address].    Your next appointment:   4-5 month(s)  The format for your next appointment:   In Person  Provider:   Elouise Munroe, MD     Other Instructions   Important Information About Sugar

## 2021-07-08 NOTE — Progress Notes (Signed)
[facility] Note  07/21/2021     CHIEF COMPLAINT Patient presents for Retina Follow Up   HISTORY OF PRESENT ILLNESS: Juan [name] is a 61 y.o. male who presents to the clinic today for:   HPI     Retina Follow Up   Patient presents with  Diabetic Retinopathy.  In both eyes.  This started 6 weeks ago.  I, the attending physician,  performed the HPI with the patient and updated documentation appropriately.        Comments   Patient here for 6 weeks retina follow up for NPDR OU. Patient states vision about the same. No eye pain.       Last edited by Bernarda Caffey, MD on 07/21/2021 11:55 PM.      Referring physician: Harlan Stains, MD [place],  [place] [address]  HISTORICAL INFORMATION:   Selected notes from the MEDICAL RECORD NUMBER Referred by Dr. Rutherford Guys for concern of CME s/p cataract sx   CURRENT MEDICATIONS: Current Outpatient Medications (Ophthalmic Drugs)  Medication Sig   dorzolamide-timolol (COSOPT) 22.3-6.8 MG/ML ophthalmic solution Place 1 drop into the right eye 2 (two) times daily. (Patient taking differently: Place 1 drop into both eyes 2 (two) times daily.)   No current facility-administered medications for this visit. (Ophthalmic Drugs)   Current Outpatient Medications (Other)  Medication Sig   ADVAIR DISKUS 500-50 MCG/DOSE AEPB Inhale 1 puff into the lungs 2 (two) times daily.   albuterol (VENTOLIN HFA) 108 (90 Base) MCG/ACT inhaler Inhale 1-2 puffs into the lungs every 6 (six) hours as needed for wheezing or shortness of breath.   alfuzosin (UROXATRAL) 10 MG 24 hr tablet TAKE 1 TABLET BY MOUTH EVERYDAY AT BEDTIME   aspirin EC 81 MG tablet Take 81 mg by mouth daily.   atorvastatin (LIPITOR) 40 MG tablet Take 40 mg by mouth daily.   carvedilol (COREG) 6.25 MG tablet TAKE 1 TABLET BY MOUTH TWICE A DAY WITH A MEAL   Empagliflozin-metFORMIN HCl (SYNJARDY) 12.06-998 MG TABS Take 1 tablet by  mouth 2 (two) times a day.    esomeprazole (NEXIUM) 40 MG capsule Take 40 mg by mouth daily at 12 noon.   famotidine (PEPCID) 40 MG tablet Take by mouth.   insulin glargine, 2 Unit Dial, (TOUJEO MAX SOLOSTAR) 300 UNIT/ML Solostar Pen Inject 40 Units into the skin daily.    iron polysaccharides (NIFEREX) 150 MG capsule Take 150 mg by mouth daily.   isosorbide mononitrate (IMDUR) 30 MG 24 hr tablet Take 30 mg by mouth daily.   lisinopril (ZESTRIL) 40 MG tablet Take 40 mg by mouth daily.   nitroGLYCERIN (NITROSTAT) 0.4 MG SL tablet Place 0.4 mg under the tongue daily.   No current facility-administered medications for this visit. (Other)   REVIEW OF SYSTEMS: ROS   Positive for: Gastrointestinal, Musculoskeletal, Endocrine, Eyes, Respiratory Negative for: Constitutional, Neurological, Skin, Genitourinary, HENT, Cardiovascular, Psychiatric, Allergic/Imm, Heme/Lymph Last edited by Theodore Demark, COA on 07/21/2021  2:19 PM.     ALLERGIES Allergies  Allergen Reactions   Naproxen Shortness Of Breath   Naproxen Sodium Shortness Of Breath   Tiotropium Bromide Monohydrate Shortness Of Breath   Penicillins Rash    Has patient had a PCN reaction causing immediate rash, facial/tongue/throat swelling, SOB or lightheadedness with hypotension: Yes Has patient had a PCN reaction causing severe rash involving mucus membranes or skin necrosis: No Has patient had a PCN reaction that required  hospitalization No Has patient had a PCN reaction occurring within the last 10 years: No If all of the above answers are "NO", then may proceed with Cephalosporin use.  Has patient had a PCN reaction causing immediate rash, facial/tongue/throat swelling, SOB or lightheadedness with hypotension: Yes Has patient had a PCN reaction causing severe rash involving mucus membranes or skin necrosis: No Has patient had a PCN reaction that required hospitalization No Has patient had a PCN reaction occurring within the last  10 years: No If all of the above answers are "NO", then may proceed with Cephalosporin use.    PAST MEDICAL HISTORY Past Medical History:  Diagnosis Date   Arthritis    Asthma    COPD (chronic obstructive pulmonary disease) ([facility])    Diabetes mellitus    Type 2   Diabetic retinopathy ([place])    NPDR OU   GERD (gastroesophageal reflux disease)    H/O hiatal hernia    History of kidney stones    Hyperlipemia    Hypertension    Hypertensive retinopathy    OU   Nasal polyps    Pneumonia 2014   Shortness of breath    Sleep apnea    pt has CPAP but is unable to use it d/t having polyps in his nose. Pt stated "I need to call and get a CPAP mask instead"   Stomach ulcer    Past Surgical History:  Procedure Laterality Date   CATARACT EXTRACTION Right 07/05/2018   Dr. Gershon Crane   CATARACT EXTRACTION Left 07/12/2018   Dr. Gershon Crane   CHOLECYSTECTOMY N/A 01/23/2015   Procedure: LAPAROSCOPIC CHOLECYSTECTOMY ;  Surgeon: Georganna Skeans, MD;  Location: [facility];  Service: General;  Laterality: N/A;   COLONOSCOPY W/ POLYPECTOMY     ESOPHAGOGASTRODUODENOSCOPY     EYE SURGERY     KNEE ARTHROSCOPY  03/24/2011   Procedure: ARTHROSCOPY KNEE;  Surgeon: Alta Corning, MD;  Location: [facility];  Service: Orthopedics;  Laterality: Right;  partial medial menisectomy, chondroplaty patella, and medial medial plica   LEFT HEART CATH AND CORONARY ANGIOGRAPHY N/A 06/29/2019   Procedure: LEFT HEART CATH AND CORONARY ANGIOGRAPHY;  Surgeon: Wellington Hampshire, MD;  Location: [facility];  Service: Cardiovascular;  Laterality: N/A;   FAMILY HISTORY Family History  Problem Relation Age of Onset   Diabetes Father    Coronary artery disease Father    SOCIAL HISTORY Social History   Tobacco Use   Smoking status: Former    Packs/day: 3.00    Years: 20.00    Total pack years: 60.00    Types: Cigarettes   Smokeless tobacco: Current    Types: Chew   Tobacco comments:    1 can/daily   Vaping Use   Vaping Use: Never used  Substance Use Topics   Alcohol use: Yes    Comment: social, 1x weekly   Drug use: No       OPHTHALMIC EXAM:  Base Eye Exam     Visual Acuity (Snellen - Linear)       Right Left   Dist [place] 20/25 20/60 +2   Dist ph [place] 20/20 -2 20/40 -2         Tonometry (Tonopen, 2:17 PM)       Right Left   Pressure 20 20         Pupils       Dark Light Shape React APD   Right 3 2 Round Brisk None   Left  3 2 Round Brisk None         Visual Fields (Counting fingers)       Left Right     Full   Restrictions Partial inner superior temporal deficiency          Extraocular Movement       Right Left    Full, Ortho Full, Ortho         Neuro/Psych     Oriented x3: Yes   Mood/Affect: Normal         Dilation     Both eyes: 1.0% Mydriacyl, 2.5% Phenylephrine @ 2:17 PM           Slit Lamp and Fundus Exam     Slit Lamp Exam       Right Left   Lids/Lashes Dermatochalasis - upper lid, Telangiectasia, mild Meibomian gland dysfunction Dermatochalasis - upper lid, Telangiectasia, mild Meibomian gland dysfunction   Conjunctiva/Sclera White and quiet Mild nasal Pinguecula   Cornea Mild Arcus, Well healed temporal cataract wounds Arcus, Well healed temporal cataract wounds, trace PEE   Anterior Chamber Deep and clear Deep and clear   Iris Round and dilated, mild patch of atrophy at 0800, no NVI Round and dilated, No NVI   Lens PC IOL in good position PC IOL in good position   Anterior Vitreous Vitreous syneresis Vitreous syneresis         Fundus Exam       Right Left   Disc trace pallor, Sharp rim Pallor, Sharp rim, Compact, mild PPP temporal   C/D Ratio 0.3 0.3   Macula Good foveal reflex, scattered IRH/DBH -- greatest temporal macula - improved, trace residual cystic changes remain, good targets for focal laser ST macula Blunted foveal reflex, +central atrophy and pigment clumping, scattered DBH and MAs, scattered punctate  exudates - greatest superior and temporal macula -- persistent, cystic changes / edema temporal macula - persistent   Vessels attenuated, Tortuous attenuated, Tortuous   Periphery Attached, scattered DBH greatest posteriorly Attached, pigmented choroidal nevus superiorly with mild elevation, +drusen, no SRF or orange pigment -- unchanged from prior           IMAGING AND PROCEDURES  Imaging and Procedures for _0 @  OCT, Retina - OU - Both Eyes       Right Eye Quality was good. Central Foveal Thickness: 260. Progression has improved. Findings include normal foveal contour, no SRF, intraretinal hyper-reflective material, intraretinal fluid, outer retinal atrophy (Interval improvement in IRF - just mild cystic changes remain temporal macula).   Left Eye Quality was good. Central Foveal Thickness: 212. Progression has improved. Findings include normal foveal contour, no SRF, intraretinal hyper-reflective material, intraretinal fluid, outer retinal atrophy (Mild interval improvement in IRF/IRHM greatest ST mac, persistent central ORA; Sub-RPE hyper-reflective mass consistent with choroidal nevus--superior to disc, caught on widefield -- stable from prior).   Notes *Images captured and stored on drive  Diagnosis / Impression: +DME OU OD: Interval improvement in IRF - just mild cystic changes remain temporal macula OS: mild interval improvement in IRF/IRHM greatest ST mac, persistent central ORA; Sub-RPE hyper-reflective mass consistent with choroidal nevus--superior to disc, caught on widefield -- stable from prior  Clinical management:  See below  Abbreviations: NFP - Normal foveal profile. CME - cystoid macular edema. PED - pigment epithelial detachment. IRF - intraretinal fluid. SRF - subretinal fluid. EZ - ellipsoid zone. ERM - epiretinal membrane. ORA - outer retinal atrophy. ORT - outer retinal tubulation. SRHM - subretinal hyper-reflective  material      Intravitreal Injection,  Pharmacologic Agent - OD - Right Eye       Time Out 07/21/2021. 3:17 PM. Confirmed correct patient, procedure, site, and patient consented.   Anesthesia Topical anesthesia was used. Anesthetic medications included Lidocaine 2%, Proparacaine 0.5%.   Procedure Preparation included 5% betadine to ocular surface, eyelid speculum. A (32g) needle was used.   Injection: 2 mg aflibercept 2 MG/0.05ML   Route: Intravitreal, Site: Right Eye   NDC: A3590391, Lot: [id number], Expiration date: 05/02/2022, Waste: 0 mL   Post-op Post injection exam found visual acuity of at least counting fingers. The patient tolerated the procedure well. There were no complications. The patient received written and verbal post procedure care education. Post injection medications were not given.      Intravitreal Injection, Pharmacologic Agent - OS - Left Eye       Time Out 07/21/2021. 3:18 PM. Confirmed correct patient, procedure, site, and patient consented.   Anesthesia Topical anesthesia was used. Anesthetic medications included Lidocaine 2%, Proparacaine 0.5%.   Procedure Preparation included 5% betadine to ocular surface, eyelid speculum. A (32g) needle was used.   Injection: 2 mg aflibercept 2 MG/0.05ML   Route: Intravitreal, Site: Left Eye   NDC: A3590391, Lot: [id number], Expiration date: 05/01/2022, Waste: 0 mL   Post-op Post injection exam found visual acuity of at least counting fingers. The patient tolerated the procedure well. There were no complications. The patient received written and verbal post procedure care education. Post injection medications were not given.            ASSESSMENT/PLAN:    ICD-10-CM   1. Severe nonproliferative diabetic retinopathy of both eyes with macular edema associated with type 2 diabetes mellitus (HCC)  E11.3413 OCT, Retina - OU - Both Eyes    Intravitreal Injection, Pharmacologic Agent - OD - Right Eye    Intravitreal Injection, Pharmacologic  Agent - OS - Left Eye    aflibercept (EYLEA) SOLN 2 mg    aflibercept (EYLEA) SOLN 2 mg    2. 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 Essential hypertension  I10     4. Hypertensive retinopathy of both eyes  H35.033     5. Pseudophakia of both eyes  Z96.1     6. Ocular hypertension of right eye  H40.051       1. Severe nonproliferative diabetic retinopathy with DME, OU (OS>OD)  - s/p IVA OD #1 (06.26.20), #2 (08.14.20), #3 (09.17.20), #4 (10.16.20), #5 (02.02.22)  - s/p IVA OS #1 (07.17.20), #2 (08.14.20), #3 (09.17.20)  **IVA RESISTANCE OU** - s/p IVE OS #1 (10.16.20) - sample, #2 (11.13.20), #3 (12.11.20), #4 (01.18.21), #5 (02.19.21), #6 (03.19.21), #7 (04.16.21), #8 (05.14.21), #9 (06.17.21), #10 (7.16.21), #11 (09.21.21), #12 (10.29.21), #13 (11.29.21), #14 (12.29.21), #15 (02.02.22, sample), #16 (03.09.22), #17 (04.06.22), #18 (05.11.2012), #19 (06.15.22), #20 (7.20.22), #21 (08.24.22), #22 (10.4.22), #23 (11.8.22), #24 (12.13.22), #25 (01.17.23), #26 (02.21.23), #27 (03.28.23). #28 (05.09.23) - s/p IVE OD #1 (11.13.20), #2 (12.11.20), #3 (01.18.21), #4 (02.19.21), #5 (03.19.21), #6 (04.16.21), #7 (05.14.21), #8 (06.17.21), #9 (07.16.21), #10 (09.21.21), #11 (10.29.21), #12 (11.29.21), #13 (12.29.21), #14 (03.09.22), #15 (04.06.22), #16 (05.11.12), #17 (06.15.22), #18 (7.20.22), #19 (08.24.22), #20 (10.04.22), #21 (11.08.22), #22 (12.13.22), #23 (02.21.23), #24 (03.28.23), #25 (05.09.23)  - FA (06.26.20) shows Severe NPDR OU w/ Late leaking MA OU, Enlarged FAZ OU, No NV OU, No significant hyperfluorescence of disc  - BCVA OD 20/20; OS 20/40 -- stable  -  OCT shows OD: Interval improvement in IRF - just mild cystic changes remain temporal macula; OS: mild interval improvement in IRF/IRHM greatest ST mac, persistent central ORA; Sub-RPE hyper-reflective mass consistent with choroidal nevus--superior to disc, caught on widefield -- stable from prior at 6 wks  - recommend IVE OU (OD #26  and OS #29), 06.20.23 -- Eylea approved until 03/05/2022 and has Eylea 4U  - pt wishes to proceed  - RBA of procedure discussed, questions answered  - informed consent obtained  - Eylea informed consent form re-signed and scanned on 05.09.23  - see procedure note  - Eylea4U Benefits Investigation initiated 10.16.2020 -- approved for 2023  - f/u in 3-4 weeks-- DFE/OCT/focal laser OD, and f/u 6 wks from now (around Aug 1)  2. Retinal Edema OU  - based on FA, suspect majority of macular edema due to DM as above, but there may be some edema secondary to chronic post-op CME  - continue PF and ketorolac qd OU (pt reports he has been using BID OU)  - may discontinue next visit  - history of difficulty with compliance at work  - monitor  - f/u in 6 weeks  3. Choroidal Nevus OS  - located at 1200, mild elevation, +drusen, no SRF  - baseline optos pictures obtained, 06.26.20  - discussed possible referral to oncology at [facility] in [place]  4,5.Hypertensive retinopathy OU  - discussed importance of tight BP control  - monitor  6. Pseudophakia OU  - s/p CE/IOL OU (OD on 06.03.20 and OS 06.10.20 by Dr. Gershon Crane)  - beautiful surgeries w/ IOLs in excellent position  - macular edema limiting vision as above  - monitor  7. Ocular hypertension   - IOP 20 OU  - cont Cosopt qd OU - if IOP increases will go back to BID  - monitor  Ophthalmic Meds Ordered this visit:  Meds ordered this encounter  Medications   aflibercept (EYLEA) SOLN 2 mg   aflibercept (EYLEA) SOLN 2 mg     Return for f/u 3-4 weeks, NPDR OU, DFE, OCT, focal laser OD.  There are no Patient Instructions on file for this visit.  This document serves as a record of services personally performed by Gardiner Sleeper, MD, PhD. It was created on their behalf by Renaldo Reel, [place] an ophthalmic technician. The creation of this record is the provider's dictation and/or activities during the visit.    Electronically signed  by:  Renaldo Reel, COT  07/21/21 11:57 PM   This document serves as a record of services personally performed by Gardiner Sleeper, MD, PhD. It was created on their behalf by San Jetty. Owens Shark, OA an ophthalmic technician. The creation of this record is the provider's dictation and/or activities during the visit.    Electronically signed by: San Jetty. Owens Shark, [place] 06.20.2023 11:57 PM  Gardiner Sleeper, M.D., Ph.D. Diseases & Surgery of the Retina and Vitreous [facility]  I have reviewed the above documentation for accuracy and completeness, and I agree with the above. Gardiner Sleeper, M.D., Ph.D. 07/22/21 12:01 AM\  Abbreviations: M myopia (nearsighted); A astigmatism; H hyperopia (farsighted); P presbyopia; Mrx spectacle prescription;  CTL contact lenses; OD right eye; OS left eye; OU both eyes  XT exotropia; ET esotropia; PEK punctate epithelial keratitis; PEE punctate epithelial erosions; DES dry eye syndrome; MGD meibomian gland dysfunction; ATs artificial tears; PFAT's preservative free artificial tears; [place] nuclear sclerotic cataract; PSC posterior subcapsular cataract;  ERM epi-retinal membrane; PVD posterior vitreous detachment; RD retinal detachment; DM diabetes mellitus; DR diabetic retinopathy; NPDR non-proliferative diabetic retinopathy; PDR proliferative diabetic retinopathy; CSME clinically significant macular edema; DME diabetic macular edema; dbh dot blot hemorrhages; CWS cotton wool spot; POAG primary open angle glaucoma; C/D cup-to-disc ratio; HVF humphrey visual field; GVF goldmann visual field; OCT optical coherence tomography; IOP intraocular pressure; BRVO Branch retinal vein occlusion; CRVO central retinal vein occlusion; CRAO central retinal artery occlusion; BRAO branch retinal artery occlusion; RT retinal tear; SB scleral buckle; PPV pars plana vitrectomy; VH Vitreous hemorrhage; PRP panretinal laser photocoagulation; IVK intravitreal kenalog; VMT  vitreomacular traction; MH Macular hole;  NVD neovascularization of the disc; NVE neovascularization elsewhere; AREDS age related eye disease study; ARMD age related macular degeneration; POAG primary open angle glaucoma; EBMD epithelial/anterior basement membrane dystrophy; ACIOL anterior chamber intraocular lens; IOL intraocular lens; PCIOL posterior chamber intraocular lens; Phaco/IOL phacoemulsification with intraocular lens placement; [place] photorefractive keratectomy; LASIK laser assisted in situ keratomileusis; HTN hypertension; DM diabetes mellitus; COPD chronic obstructive pulmonary disease

## 2021-07-21 ENCOUNTER — Ambulatory Visit (INDEPENDENT_AMBULATORY_CARE_PROVIDER_SITE_OTHER): Payer: BC Managed Care – PPO | Admitting: Ophthalmology

## 2021-07-21 ENCOUNTER — Encounter (INDEPENDENT_AMBULATORY_CARE_PROVIDER_SITE_OTHER): Payer: Self-pay | Admitting: Ophthalmology

## 2021-07-21 DIAGNOSIS — D3132 Benign neoplasm of left choroid: Secondary | ICD-10-CM | POA: Diagnosis not present

## 2021-07-21 DIAGNOSIS — H35033 Hypertensive retinopathy, bilateral: Secondary | ICD-10-CM | POA: Diagnosis not present

## 2021-07-21 DIAGNOSIS — I10 Essential (primary) hypertension: Secondary | ICD-10-CM | POA: Diagnosis not present

## 2021-07-21 DIAGNOSIS — E113413 Type 2 diabetes mellitus with severe nonproliferative diabetic retinopathy with macular edema, bilateral: Secondary | ICD-10-CM | POA: Diagnosis not present

## 2021-07-21 DIAGNOSIS — Z961 Presence of intraocular lens: Secondary | ICD-10-CM

## 2021-07-21 DIAGNOSIS — H40051 Ocular hypertension, right eye: Secondary | ICD-10-CM

## 2021-07-21 MED ORDER — AFLIBERCEPT 2MG/0.05ML IZ SOLN FOR KALEIDOSCOPE
2.0000 mg | INTRAVITREAL | Status: AC | PRN
  Administered 2021-07-21: 2 mg via INTRAVITREAL

## 2021-07-22 ENCOUNTER — Telehealth: Payer: Self-pay | Admitting: Internal Medicine

## 2021-07-22 MED ORDER — CARVEDILOL 6.25 MG PO TABS: ORAL_TABLET | ORAL | 2 refills | Status: DC

## 2021-07-22 NOTE — Telephone Encounter (Signed)
*  STAT* If patient is at the pharmacy, call can be transferred to refill team.   1. Which medications need to be refilled? (please list name of each medication and dose if known) Carvedilol  2. Which pharmacy/location (including street and city if local pharmacy) is medication to be sent to?[facility]  3. Do they need a 30 day or 90 day supply? 90 days and refills- need this asap

## 2021-07-27 ENCOUNTER — Telehealth: Payer: Self-pay | Admitting: Internal Medicine

## 2021-07-27 ENCOUNTER — Other Ambulatory Visit: Payer: Self-pay

## 2021-07-27 MED ORDER — CARVEDILOL 6.25 MG PO TABS: 6.2500 mg | ORAL_TABLET | Freq: Two times a day (BID) | ORAL | 0 refills | Status: DC

## 2021-07-27 MED ORDER — CARVEDILOL 6.25 MG PO TABS: 6.2500 mg | ORAL_TABLET | Freq: Two times a day (BID) | ORAL | 3 refills | Status: DC

## 2021-07-27 MED ORDER — CARVEDILOL 6.25 MG PO TABS: ORAL_TABLET | ORAL | 2 refills | Status: DC

## 2021-07-27 NOTE — Telephone Encounter (Signed)
*  STAT* If patient is at the pharmacy, call can be transferred to refill team.   1. Which medications need to be refilled? (please list name of each medication and dose if known)  new prescription for Carvedilol- until his mail order gets here  2. Which pharmacy/location (including street and city if local pharmacy) is medication to be sent to?[facility]  [address], [place]  3. Do they need a 30 day or 90 day supply? 10 days- please call today- patient been without his medicine for 6 days

## 2021-07-27 NOTE — Telephone Encounter (Signed)
Follow Up:    Refill was sent to the wrong pharmacy on 07-22-21.     *STAT* If patient is at the pharmacy, call can be transferred to refill team.   1. Which medications need to be refilled? (please list name of each medication and dose if known) Carvedilol   2. Which pharmacy/location (including street and city if local pharmacy) is medication to be sent to?[facility]  [phone]  3. Do they need a 30 day or 90 day supply? 90 days and refills- please send today asap

## 2021-07-27 NOTE — Telephone Encounter (Signed)
Sent prescription to local pharmacy of choice. Called [facility] to confirm prescription received, they will fill ASAP. Called pt's wife to make aware that they will be getting that ready for them. Wife verbalizes understanding.

## 2021-08-05 NOTE — Progress Notes (Signed)
[facility] Note  08/18/2021     CHIEF COMPLAINT Patient presents for Retina Follow Up   HISTORY OF PRESENT ILLNESS: Juan [name] is a 61 y.o. male who presents to the clinic today for:   HPI     Retina Follow Up   Patient presents with  Diabetic Retinopathy.  In both eyes.  This started 4 weeks ago.  I, the attending physician,  performed the HPI with the patient and updated documentation appropriately.        Comments   Patient here for 4 weeks retina follow up for NPDR OU. Patient states vision about the same. No eye pain.       Last edited by Bernarda Caffey, MD on 08/18/2021  4:40 PM.     Referring physician: Harlan Stains, MD [place],  [place] [id number]  HISTORICAL INFORMATION:   Selected notes from the MEDICAL RECORD NUMBER Referred by Dr. Rutherford Guys for concern of CME s/p cataract sx   CURRENT MEDICATIONS: Current Outpatient Medications (Ophthalmic Drugs)  Medication Sig   dorzolamide-timolol (COSOPT) 22.3-6.8 MG/ML ophthalmic solution Place 1 drop into the right eye 2 (two) times daily. (Patient taking differently: Place 1 drop into both eyes 2 (two) times daily.)   No current facility-administered medications for this visit. (Ophthalmic Drugs)   Current Outpatient Medications (Other)  Medication Sig   ADVAIR DISKUS 500-50 MCG/DOSE AEPB Inhale 1 puff into the lungs 2 (two) times daily.   albuterol (VENTOLIN HFA) 108 (90 Base) MCG/ACT inhaler Inhale 1-2 puffs into the lungs every 6 (six) hours as needed for wheezing or shortness of breath.   alfuzosin (UROXATRAL) 10 MG 24 hr tablet TAKE 1 TABLET BY MOUTH EVERYDAY AT BEDTIME   aspirin EC 81 MG tablet Take 81 mg by mouth daily.   atorvastatin (LIPITOR) 40 MG tablet Take 40 mg by mouth daily.   carvedilol (COREG) 6.25 MG tablet Take 1 tablet (6.25 mg total) by mouth 2 (two) times daily with a meal.   Empagliflozin-metFORMIN HCl (SYNJARDY) 12.06-998 MG  TABS Take 1 tablet by mouth 2 (two) times a day.    esomeprazole (NEXIUM) 40 MG capsule Take 40 mg by mouth daily at 12 noon.   famotidine (PEPCID) 40 MG tablet Take by mouth.   insulin glargine, 2 Unit Dial, (TOUJEO MAX SOLOSTAR) 300 UNIT/ML Solostar Pen Inject 40 Units into the skin daily.    iron polysaccharides (NIFEREX) 150 MG capsule Take 150 mg by mouth daily.   isosorbide mononitrate (IMDUR) 30 MG 24 hr tablet Take 30 mg by mouth daily.   lisinopril (ZESTRIL) 40 MG tablet Take 40 mg by mouth daily.   nitroGLYCERIN (NITROSTAT) 0.4 MG SL tablet Place 0.4 mg under the tongue daily.   No current facility-administered medications for this visit. (Other)   REVIEW OF SYSTEMS: ROS   Positive for: Gastrointestinal, Musculoskeletal, Endocrine, Eyes, Respiratory Negative for: Constitutional, Neurological, Skin, Genitourinary, HENT, Cardiovascular, Psychiatric, Allergic/Imm, Heme/Lymph Last edited by Theodore Demark, COA on 08/18/2021  1:55 PM.     ALLERGIES Allergies  Allergen Reactions   Naproxen Shortness Of Breath   Naproxen Sodium Shortness Of Breath   Tiotropium Bromide Monohydrate Shortness Of Breath   Penicillins Rash    Has patient had a PCN reaction causing immediate rash, facial/tongue/throat swelling, SOB or lightheadedness with hypotension: Yes Has patient had a PCN reaction causing severe rash involving mucus membranes or skin necrosis: No Has patient had a  PCN reaction that required hospitalization No Has patient had a PCN reaction occurring within the last 10 years: No If all of the above answers are "NO", then may proceed with Cephalosporin use.  Has patient had a PCN reaction causing immediate rash, facial/tongue/throat swelling, SOB or lightheadedness with hypotension: Yes Has patient had a PCN reaction causing severe rash involving mucus membranes or skin necrosis: No Has patient had a PCN reaction that required hospitalization No Has patient had a PCN reaction  occurring within the last 10 years: No If all of the above answers are "NO", then may proceed with Cephalosporin use.    PAST MEDICAL HISTORY Past Medical History:  Diagnosis Date   Arthritis    Asthma    COPD (chronic obstructive pulmonary disease) ([place])    Diabetes mellitus    Type 2   Diabetic retinopathy ([place])    NPDR OU   GERD (gastroesophageal reflux disease)    H/O hiatal hernia    History of kidney stones    Hyperlipemia    Hypertension    Hypertensive retinopathy    OU   Nasal polyps    Pneumonia 2014   Shortness of breath    Sleep apnea    pt has CPAP but is unable to use it d/t having polyps in his nose. Pt stated "I need to call and get a CPAP mask instead"   Stomach ulcer    Past Surgical History:  Procedure Laterality Date   CATARACT EXTRACTION Right 07/05/2018   Dr. Gershon Crane   CATARACT EXTRACTION Left 07/12/2018   Dr. Gershon Crane   CHOLECYSTECTOMY N/A 01/23/2015   Procedure: LAPAROSCOPIC CHOLECYSTECTOMY ;  Surgeon: Georganna Skeans, MD;  Location: [facility];  Service: General;  Laterality: N/A;   COLONOSCOPY W/ POLYPECTOMY     ESOPHAGOGASTRODUODENOSCOPY     EYE SURGERY     KNEE ARTHROSCOPY  03/24/2011   Procedure: ARTHROSCOPY KNEE;  Surgeon: Alta Corning, MD;  Location: [facility];  Service: Orthopedics;  Laterality: Right;  partial medial menisectomy, chondroplaty patella, and medial medial plica   LEFT HEART CATH AND CORONARY ANGIOGRAPHY N/A 06/29/2019   Procedure: LEFT HEART CATH AND CORONARY ANGIOGRAPHY;  Surgeon: Wellington Hampshire, MD;  Location: [facility];  Service: Cardiovascular;  Laterality: N/A;   FAMILY HISTORY Family History  Problem Relation Age of Onset   Diabetes Father    Coronary artery disease Father    SOCIAL HISTORY Social History   Tobacco Use   Smoking status: Former    Packs/day: 3.00    Years: 20.00    Total pack years: 60.00    Types: Cigarettes   Smokeless tobacco: Current    Types: Chew   Tobacco  comments:    1 can/daily  Vaping Use   Vaping Use: Never used  Substance Use Topics   Alcohol use: Yes    Comment: social, 1x weekly   Drug use: No       OPHTHALMIC EXAM:  Base Eye Exam     Visual Acuity (Snellen - Linear)       Right Left   Dist [place] 20/25 20/50 -1   Dist ph [place] 20/20 20/40         Tonometry (Tonopen, 1:53 PM)       Right Left   Pressure 16 15         Pupils       Dark Light Shape React APD   Right 3 2 Round Brisk None  Left 3 2 Round Brisk None         Visual Fields (Counting fingers)       Left Right     Full   Restrictions Partial inner superior temporal deficiency          Extraocular Movement       Right Left    Full, Ortho Full, Ortho         Neuro/Psych     Oriented x3: Yes   Mood/Affect: Normal         Dilation     Both eyes: 1.0% Mydriacyl, 2.5% Phenylephrine @ 1:53 PM           Slit Lamp and Fundus Exam     Slit Lamp Exam       Right Left   Lids/Lashes Dermatochalasis - upper lid, Telangiectasia, mild Meibomian gland dysfunction Dermatochalasis - upper lid, Telangiectasia, mild Meibomian gland dysfunction   Conjunctiva/Sclera White and quiet Mild nasal Pinguecula   Cornea Mild Arcus, Well healed temporal cataract wounds Arcus, Well healed temporal cataract wounds, trace PEE   Anterior Chamber Deep and clear Deep and clear   Iris Round and dilated, mild patch of atrophy at 0800, no NVI Round and dilated, No NVI   Lens PC IOL in good position PC IOL in good position   Anterior Vitreous Vitreous syneresis Vitreous syneresis         Fundus Exam       Right Left   Disc trace pallor, Sharp rim Pallor, Sharp rim, Compact, mild PPP temporal   C/D Ratio 0.3 0.3   Macula Good foveal reflex, scattered IRH/DBH -- greatest temporal macula - improved, trace residual cystic changes remain, good targets for focal laser temporal macula Blunted foveal reflex, +central atrophy and pigment clumping, scattered DBH  and MAs, scattered punctate exudates - greatest superior and temporal macula -- persistent, cystic changes / edema temporal macula - persistent   Vessels attenuated, Tortuous attenuated, Tortuous   Periphery Attached, scattered DBH greatest posteriorly Attached, pigmented choroidal nevus superiorly with mild elevation, +drusen, no SRF or orange pigment -- unchanged from prior           IMAGING AND PROCEDURES  Imaging and Procedures for _0 @  OCT, Retina - OU - Both Eyes       Right Eye Quality was good. Central Foveal Thickness: 243. Progression has improved. Findings include normal foveal contour, no SRF, intraretinal hyper-reflective material, intraretinal fluid, outer retinal atrophy, vitreomacular adhesion (mild persistent cystic changes temporal macula -- slightly improved).   Left Eye Quality was good. Central Foveal Thickness: 210. Progression has improved. Findings include normal foveal contour, no SRF, intraretinal hyper-reflective material, intraretinal fluid, outer retinal atrophy (Mild interval improvement in IRF/IRHM greatest ST mac, persistent central ORA; Sub-RPE hyper-reflective mass consistent with choroidal nevus--superior to disc, caught on widefield -- stable from prior).   Notes *Images captured and stored on drive  Diagnosis / Impression: +DME OU OD: mild persistent cystic changes temporal macula -- slightly improved OS: mild interval improvement in IRF/IRHM greatest ST mac, persistent central ORA; Sub-RPE hyper-reflective mass consistent with choroidal nevus--superior to disc, caught on widefield -- stable from prior  Clinical management:  See below  Abbreviations: NFP - Normal foveal profile. CME - cystoid macular edema. PED - pigment epithelial detachment. IRF - intraretinal fluid. SRF - subretinal fluid. EZ - ellipsoid zone. ERM - epiretinal membrane. ORA - outer retinal atrophy. ORT - outer retinal tubulation. SRHM - subretinal hyper-reflective  material  Focal Laser - OD - Right Eye       LASER PROCEDURE NOTE  Diagnosis:   Diabetic macular edema, RIGHT EYE  Procedure:  Focal laser photocoagulation using slit lamp laser, RIGHT EYE  Anesthesia:  Topical  Surgeon: Bernarda Caffey, MD, PhD   Informed consent obtained, operative eye marked, and time out performed prior to initiation of laser.   Lumenis [id number] Focal/Grid laser Power: 80 mW Duration: 50 msec  Spot size: 100 microns  # spots: 47 spots placed to MAs temporal macula.  Complications: None.  RTC: Aug 1st or later -- DFE/OCT, possible injections  Patient tolerated the procedure well and received written and verbal post-procedure care information/education.           ASSESSMENT/PLAN:    ICD-10-CM   1. Severe nonproliferative diabetic retinopathy of both eyes with macular edema associated with type 2 diabetes mellitus (HCC)  E11.3413 OCT, Retina - OU - Both Eyes    Focal Laser - OD - Right Eye    2. Retinal edema  H35.81     3. 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 Essential hypertension  I10     5. Hypertensive retinopathy of both eyes  H35.033     6. Pseudophakia of both eyes  Z96.1     7. Ocular hypertension of right eye  H40.051      1. Severe nonproliferative diabetic retinopathy with DME, OU (OS>OD)  - s/p IVA OD #1 (06.26.20), #2 (08.14.20), #3 (09.17.20), #4 (10.16.20), #5 (02.02.22)  - s/p IVA OS #1 (07.17.20), #2 (08.14.20), #3 (09.17.20)  **IVA RESISTANCE OU** - s/p IVE OS #1 (10.16.20) - sample, #2 (11.13.20), #3 (12.11.20), #4 (01.18.21), #5 (02.19.21), #6 (03.19.21), #7 (04.16.21), #8 (05.14.21), #9 (06.17.21), #10 (7.16.21), #11 (09.21.21), #12 (10.29.21), #13 (11.29.21), #14 (12.29.21), #15 (02.02.22, sample), #16 (03.09.22), #17 (04.06.22), #18 (05.11.2012), #19 (06.15.22), #20 (7.20.22), #21 (08.24.22), #22 (10.4.22), #23 (11.8.22), #24 (12.13.22), #25 (01.17.23), #26 (02.21.23), #27 (03.28.23). #28 (05.09.23), #29  (06.20.23) - s/p IVE OD #1 (11.13.20), #2 (12.11.20), #3 (01.18.21), #4 (02.19.21), #5 (03.19.21), #6 (04.16.21), #7 (05.14.21), #8 (06.17.21), #9 (07.16.21), #10 (09.21.21), #11 (10.29.21), #12 (11.29.21), #13 (12.29.21), #14 (03.09.22), #15 (04.06.22), #16 (05.11.12), #17 (06.15.22), #18 (7.20.22), #19 (08.24.22), #20 (10.04.22), #21 (11.08.22), #22 (12.13.22), #23 (02.21.23), #24 (03.28.23), #25 (05.09.23), #26 (06.20.23)  - FA (06.26.20) shows Severe NPDR OU w/ Late leaking MA OU, Enlarged FAZ OU, No NV OU, No significant hyperfluorescence of disc  - BCVA OD 20/20; OS 20/40 -- stable  - OCT shows OD: mild persistent cystic changes temporal macula -- slightly improved; OS: mild interval improvement in IRF/IRHM greatest ST mac, persistent central ORA; Sub-RPE hyper-reflective mass consistent with choroidal nevus--superior to disc, caught on widefield -- stable from prior  - recommend focal laser OD today, 07.18.23   - pt wishes to proceed  - RBA of procedure discussed, questions answered  - informed consent obtained  - see procedure note  - [facility] informed consent form re-signed and scanned on 05.09.23  - [email or web address] Benefits Investigation initiated 10.16.2020 -- approved for 2023  - f/u August 1 or later, DFE, OCT, possible injections  2. Retinal Edema OU  - based on FA, suspect majority of macular edema due to DM as above, but there may be some edema secondary to chronic post-op CME  - continue PF and ketorolac qd OU (pt reports he has been using BID OU)  - may discontinue next visit  - history of  difficulty with compliance at work  - monitor  - f/u in 6 weeks  3. Choroidal Nevus OS  - located at 1200, mild elevation, +drusen, no SRF  - baseline optos pictures obtained, 06.26.20  - discussed possible referral to oncology at [facility] in [place]  4,5.Hypertensive retinopathy OU  - discussed importance of tight BP control  - monitor  6. Pseudophakia OU  - s/p CE/IOL OU (OD on  06.03.20 and OS 06.10.20 by Dr. Gershon Crane)  - beautiful surgeries w/ IOLs in excellent position  - macular edema limiting vision as above  - monitor  7. Ocular hypertension   - IOP 16,15 OU  - cont Cosopt qd OU  - monitor  Ophthalmic Meds Ordered this visit:  No orders of the defined types were placed in this encounter.    Return in about 2 weeks (around 09/01/2021) for f/u August 1 or later, NPDR OU, DFE, OCT.  There are no Patient Instructions on file for this visit.  This document serves as a record of services personally performed by Gardiner Sleeper, MD, PhD. It was created on their behalf by Renaldo Reel, [place] an ophthalmic technician. The creation of this record is the provider's dictation and/or activities during the visit.    Electronically signed by:  Renaldo Reel, COT  08/05/21 4:49 PM    Gardiner Sleeper, M.D., Ph.D. Diseases & Surgery of the Retina and Vitreous [facility]  I have reviewed the above documentation for accuracy and completeness, and I agree with the above. Gardiner Sleeper, M.D., Ph.D. 08/18/21 4:49 PM  Abbreviations: M myopia (nearsighted); A astigmatism; H hyperopia (farsighted); P presbyopia; Mrx spectacle prescription;  CTL contact lenses; OD right eye; OS left eye; OU both eyes  XT exotropia; ET esotropia; PEK punctate epithelial keratitis; PEE punctate epithelial erosions; DES dry eye syndrome; MGD meibomian gland dysfunction; ATs artificial tears; PFAT's preservative free artificial tears; [place] nuclear sclerotic cataract; PSC posterior subcapsular cataract; ERM epi-retinal membrane; PVD posterior vitreous detachment; RD retinal detachment; DM diabetes mellitus; DR diabetic retinopathy; NPDR non-proliferative diabetic retinopathy; PDR proliferative diabetic retinopathy; CSME clinically significant macular edema; DME diabetic macular edema; dbh dot blot hemorrhages; CWS cotton wool spot; POAG primary open angle glaucoma; C/D  cup-to-disc ratio; HVF humphrey visual field; GVF goldmann visual field; OCT optical coherence tomography; IOP intraocular pressure; BRVO Branch retinal vein occlusion; CRVO central retinal vein occlusion; CRAO central retinal artery occlusion; BRAO branch retinal artery occlusion; RT retinal tear; SB scleral buckle; PPV pars plana vitrectomy; VH Vitreous hemorrhage; PRP panretinal laser photocoagulation; IVK intravitreal kenalog; VMT vitreomacular traction; MH Macular hole;  NVD neovascularization of the disc; NVE neovascularization elsewhere; AREDS age related eye disease study; ARMD age related macular degeneration; POAG primary open angle glaucoma; EBMD epithelial/anterior basement membrane dystrophy; ACIOL anterior chamber intraocular lens; IOL intraocular lens; PCIOL posterior chamber intraocular lens; Phaco/IOL phacoemulsification with intraocular lens placement; [place] photorefractive keratectomy; LASIK laser assisted in situ keratomileusis; HTN hypertension; DM diabetes mellitus; COPD chronic obstructive pulmonary disease

## 2021-08-18 ENCOUNTER — Encounter (INDEPENDENT_AMBULATORY_CARE_PROVIDER_SITE_OTHER): Payer: Self-pay | Admitting: Ophthalmology

## 2021-08-18 ENCOUNTER — Ambulatory Visit (INDEPENDENT_AMBULATORY_CARE_PROVIDER_SITE_OTHER): Payer: BC Managed Care – PPO | Admitting: Ophthalmology

## 2021-08-18 DIAGNOSIS — H40051 Ocular hypertension, right eye: Secondary | ICD-10-CM

## 2021-08-18 DIAGNOSIS — D3132 Benign neoplasm of left choroid: Secondary | ICD-10-CM

## 2021-08-18 DIAGNOSIS — Z961 Presence of intraocular lens: Secondary | ICD-10-CM

## 2021-08-18 DIAGNOSIS — E113413 Type 2 diabetes mellitus with severe nonproliferative diabetic retinopathy with macular edema, bilateral: Secondary | ICD-10-CM

## 2021-08-18 DIAGNOSIS — H35033 Hypertensive retinopathy, bilateral: Secondary | ICD-10-CM

## 2021-08-18 DIAGNOSIS — I10 Essential (primary) hypertension: Secondary | ICD-10-CM

## 2021-08-18 DIAGNOSIS — H3581 Retinal edema: Secondary | ICD-10-CM

## 2021-08-21 NOTE — Progress Notes (Addendum)
[facility] Note  09/01/2021     Juan [name] Juan [name]   HISTORY OF PRESENT ILLNESS: Juan [name] is a 61 y.o. male who Juan to the clinic today for:   HPI     Retina Follow [name]   [name] Juan with  Diabetic Retinopathy.  In both eyes.  This started 2 weeks ago.  I, the attending physician,  performed the HPI with the [name] and updated documentation appropriately.        Comments   [name] for NPDR OU. [name] states vision doing pretty well. No eye pain.       Last edited by Bernarda Caffey, MD on 09/01/2021  4:48 PM.    Pt states he has noticed his "focus is starting to come back a little quicker"  Referring physician: Harlan Stains, MD [place],  [place] [address]  HISTORICAL INFORMATION:   Selected notes from the MEDICAL RECORD NUMBER Referred by Dr. Rutherford Guys for concern of CME s/p cataract sx   CURRENT MEDICATIONS: Current Outpatient Medications (Ophthalmic Drugs)  Medication Sig   dorzolamide-timolol (COSOPT) 22.3-6.8 MG/ML ophthalmic solution Place 1 drop into the right eye 2 (two) times daily. ([name] taking differently: Place 1 drop into both eyes 2 (two) times daily.)   No current facility-administered medications for this visit. (Ophthalmic Drugs)   Current Outpatient Medications (Other)  Medication Sig   ADVAIR DISKUS 500-50 MCG/DOSE AEPB Inhale 1 puff into the lungs 2 (two) times daily.   albuterol (VENTOLIN HFA) 108 (90 Base) MCG/ACT inhaler Inhale 1-2 puffs into the lungs every 6 (six) hours as needed for wheezing or shortness of breath.   alfuzosin (UROXATRAL) 10 MG 24 hr tablet TAKE 1 TABLET BY MOUTH EVERYDAY AT BEDTIME   aspirin EC 81 MG tablet Take 81 mg by mouth daily.   atorvastatin (LIPITOR) 40 MG tablet Take 40 mg by mouth daily.   carvedilol (COREG) 6.25 MG tablet Take 1 tablet (6.25 mg total) by mouth 2 (two)  times daily with a meal.   Empagliflozin-metFORMIN HCl (SYNJARDY) 12.06-998 MG TABS Take 1 tablet by mouth 2 (two) times a day.    esomeprazole (NEXIUM) 40 MG capsule Take 40 mg by mouth daily at 12 noon.   famotidine (PEPCID) 40 MG tablet Take by mouth.   insulin glargine, 2 Unit Dial, (TOUJEO MAX SOLOSTAR) 300 UNIT/ML Solostar Pen Inject 40 Units into the skin daily.    iron polysaccharides (NIFEREX) 150 MG capsule Take 150 mg by mouth daily.   isosorbide mononitrate (IMDUR) 30 MG 24 hr tablet Take 30 mg by mouth daily.   lisinopril (ZESTRIL) 40 MG tablet Take 40 mg by mouth daily.   nitroGLYCERIN (NITROSTAT) 0.4 MG SL tablet Place 0.4 mg under the tongue daily.   No current facility-administered medications for this visit. (Other)   REVIEW OF SYSTEMS: ROS   Positive for: Gastrointestinal, Musculoskeletal, Endocrine, Eyes, Respiratory Negative for: Constitutional, Neurological, Skin, Genitourinary, HENT, Cardiovascular, Psychiatric, Allergic/Imm, Heme/Lymph Last edited by Theodore Demark, COA on 09/01/2021  1:55 PM.     ALLERGIES Allergies  Allergen Reactions   Naproxen Shortness Of Breath   Naproxen Sodium Shortness Of Breath   Tiotropium Bromide Monohydrate Shortness Of Breath   Penicillins Rash    Has [name] had a PCN reaction causing immediate rash, facial/tongue/throat swelling, SOB or lightheadedness with hypotension: Yes Has [name] had a PCN  reaction causing severe rash involving mucus membranes or skin necrosis: No Has [name] had a PCN reaction that required hospitalization No Has [name] had a PCN reaction occurring within the last 10 years: No If all of the above answers are "NO", then may proceed with Cephalosporin use.  Has [name] had a PCN reaction causing immediate rash, facial/tongue/throat swelling, SOB or lightheadedness with hypotension: Yes Has [name] had a PCN reaction causing severe rash involving mucus membranes or skin necrosis: No Has [name] had a PCN  reaction that required hospitalization No Has [name] had a PCN reaction occurring within the last 10 years: No If all of the above answers are "NO", then may proceed with Cephalosporin use.    PAST MEDICAL HISTORY Past Medical History:  Diagnosis Date   Arthritis    Asthma    COPD (chronic obstructive pulmonary disease) ([place])    Diabetes mellitus    Type 2   Diabetic retinopathy ([place])    NPDR OU   GERD (gastroesophageal reflux disease)    H/O hiatal hernia    History of kidney stones    Hyperlipemia    Hypertension    Hypertensive retinopathy    OU   Nasal polyps    Pneumonia 2014   Shortness of breath    Sleep apnea    pt has CPAP but is unable to use it d/t having polyps in his nose. Pt stated "I need to call and get a CPAP mask instead"   Stomach ulcer    Past Surgical History:  Procedure Laterality Date   CATARACT EXTRACTION Right 07/05/2018   Dr. Gershon Crane   CATARACT EXTRACTION Left 07/12/2018   Dr. Gershon Crane   CHOLECYSTECTOMY N/A 01/23/2015   Procedure: LAPAROSCOPIC CHOLECYSTECTOMY ;  Surgeon: Georganna Skeans, MD;  Location: [facility];  Service: General;  Laterality: N/A;   COLONOSCOPY W/ POLYPECTOMY     ESOPHAGOGASTRODUODENOSCOPY     EYE SURGERY     KNEE ARTHROSCOPY  03/24/2011   Procedure: ARTHROSCOPY KNEE;  Surgeon: Alta Corning, MD;  Location: [facility];  Service: Orthopedics;  Laterality: Right;  partial medial menisectomy, chondroplaty patella, and medial medial plica   LEFT HEART CATH AND CORONARY ANGIOGRAPHY N/A 06/29/2019   Procedure: LEFT HEART CATH AND CORONARY ANGIOGRAPHY;  Surgeon: Wellington Hampshire, MD;  Location: [facility];  Service: Cardiovascular;  Laterality: N/A;   FAMILY HISTORY Family History  Problem Relation Age of Onset   Diabetes Father    Coronary artery disease Father    SOCIAL HISTORY Social History   Tobacco Use   Smoking status: Former    Packs/day: 3.00    Years: 20.00    Total pack years: 60.00    Types:  Cigarettes   Smokeless tobacco: Current    Types: Chew   Tobacco comments:    1 can/daily  Vaping Use   Vaping Use: Never used  Substance Use Topics   Alcohol use: Yes    Comment: social, 1x weekly   Drug use: No       OPHTHALMIC EXAM:  Base Eye Exam     Visual Acuity (Snellen - Linear)       Right Left   Dist [place] 20/25 20/50   Dist ph [place] 20/20 -1 20/40 -2         Tonometry (Tonopen, 1:53 PM)       Right Left   Pressure 17 16         Pupils  Dark Light Shape React APD   Right 3 2 Round Brisk None   Left 3 2 Round Brisk None         Visual Fields (Counting fingers)       Left Right     Full   Restrictions Partial inner superior temporal deficiency          Extraocular Movement       Right Left    Full, Ortho Full, Ortho         Neuro/Psych     Oriented x3: Yes   Mood/Affect: Normal         Dilation     Both eyes:            Slit Lamp and Fundus Exam     Slit Lamp Exam       Right Left   Lids/Lashes Dermatochalasis - upper lid, Telangiectasia, mild Meibomian gland dysfunction Dermatochalasis - upper lid, Telangiectasia, mild Meibomian gland dysfunction   Conjunctiva/Sclera White and quiet Mild nasal Pinguecula   Cornea Mild Arcus, Well healed temporal cataract wounds Arcus, Well healed temporal cataract wounds, trace PEE   Anterior Chamber Deep and clear Deep and clear   Iris Round and dilated, mild patch of atrophy at 0800, no NVI Round and dilated, No NVI   Lens PC IOL in good position PC IOL in good position   Anterior Vitreous Vitreous syneresis Vitreous syneresis         Fundus Exam       Right Left   Disc trace pallor, Sharp rim Pallor, Sharp rim, Compact, mild PPP temporal   C/D Ratio 0.3 0.3   Macula Good foveal reflex, scattered IRH/DBH -- greatest temporal macula - improved, trace residual cystic changes remain, mild focal laser changes Blunted foveal reflex, +central atrophy and pigment clumping, scattered  DBH and MAs, scattered punctate exudates - greatest superior and temporal macula -- persistent, cystic changes / edema temporal macula - persistent   Vessels attenuated, Tortuous attenuated, Tortuous   Periphery Attached, scattered DBH greatest posteriorly Attached, pigmented choroidal nevus superiorly with mild elevation, +drusen, no SRF or orange pigment -- unchanged from prior           IMAGING AND PROCEDURES  Imaging and Procedures for _0 @  OCT, Retina - OU - Both Eyes       Right Eye Quality was good. Central Foveal Thickness: 243. Progression has been stable. Findings include normal foveal contour, no SRF, intraretinal hyper-reflective material, intraretinal fluid, outer retinal atrophy, vitreomacular adhesion (trace persistent cystic changes temporal macula ).   Left Eye Quality was good. Central Foveal Thickness: 204. Progression has been stable. Findings include normal foveal contour, no SRF, intraretinal hyper-reflective material, intraretinal fluid, outer retinal atrophy (persistent IRF/IRHM greatest ST mac, persistent central ORA; Sub-RPE hyper-reflective mass consistent with choroidal nevus--superior to disc, caught on widefield -- stable from prior).   Notes *Images captured and stored on drive  Diagnosis / Impression: +DME OU OD: trace persistent cystic changes temporal macula  OS: persistent IRF/IRHM greatest ST mac, persistent central ORA; Sub-RPE hyper-reflective mass consistent with choroidal nevus--superior to disc, caught on widefield -- stable from prior  Clinical management:  See below  Abbreviations: NFP - Normal foveal profile. CME - cystoid macular edema. PED - pigment epithelial detachment. IRF - intraretinal fluid. SRF - subretinal fluid. EZ - ellipsoid zone. ERM - epiretinal membrane. ORA - outer retinal atrophy. ORT - outer retinal tubulation. SRHM - subretinal hyper-reflective material  Intravitreal Injection, Pharmacologic Agent - OD -  Right Eye       Time Out 09/01/2021. 2:38 PM. Confirmed correct [name], procedure, site, and [name] consented.   Anesthesia Topical anesthesia was used. Anesthetic medications included Lidocaine 2%, Proparacaine 0.5%.   Procedure Preparation included 5% betadine to ocular surface, eyelid speculum. A (32g) needle was used.   Injection: 2 mg aflibercept 2 MG/0.05ML   Route: Intravitreal, Site: Right Eye   NDC: A3590391, Lot: [id number], Expiration date: 06/01/2022, Waste: 0 mL   Post-op Post injection exam found visual acuity of at least counting fingers. The [name] tolerated the procedure well. There were no complications. The [name] received written and verbal post procedure care education. Post injection medications were not given.      Intravitreal Injection, Pharmacologic Agent - OS - Left Eye       Time Out 09/01/2021. 2:39 PM. Confirmed correct [name], procedure, site, and [name] consented.   Anesthesia Topical anesthesia was used. Anesthetic medications included Lidocaine 2%, Proparacaine 0.5%.   Procedure Preparation included 5% betadine to ocular surface, eyelid speculum. A (32g) needle was used.   Injection: 2 mg aflibercept 2 MG/0.05ML   Route: Intravitreal, Site: Left Eye   NDC: M7179715, Lot: [id number], Expiration date: 06/01/2022, Waste: 0 mL   Post-op Post injection exam found visual acuity of at least counting fingers. The [name] tolerated the procedure well. There were no complications. The [name] received written and verbal post procedure care education. Post injection medications were not given.            ASSESSMENT/PLAN:    ICD-10-CM   1. Severe nonproliferative diabetic retinopathy of both eyes with macular edema associated with type 2 diabetes mellitus (HCC)  E11.3413 OCT, Retina - OU - Both Eyes    Intravitreal Injection, Pharmacologic Agent - OD - Right Eye    Intravitreal Injection, Pharmacologic Agent - OS - Left Eye     aflibercept (EYLEA) SOLN 2 mg    aflibercept (EYLEA) SOLN 2 mg    2. Retinal edema  H35.81     3. 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 Essential hypertension  I10     5. Hypertensive retinopathy of both eyes  H35.033     6. Pseudophakia of both eyes  Z96.1     7. Ocular hypertension of right eye  H40.051      1. Severe nonproliferative diabetic retinopathy with DME, OU (OS>OD)  - s/p IVA OD #1 (06.26.20), #2 (08.14.20), #3 (09.17.20), #4 (10.16.20), #5 (02.02.22)  - s/p IVA OS #1 (07.17.20), #2 (08.14.20), #3 (09.17.20)  **IVA RESISTANCE OU**  - s/p focal laser OD (07.18.23) - s/p IVE OS #1 (10.16.20) - sample, #2 (11.13.20), #3 (12.11.20), #4 (01.18.21), #5 (02.19.21), #6 (03.19.21), #7 (04.16.21), #8 (05.14.21), #9 (06.17.21), #10 (7.16.21), #11 (09.21.21), #12 (10.29.21), #13 (11.29.21), #14 (12.29.21), #15 (02.02.22, sample), #16 (03.09.22), #17 (04.06.22), #18 (05.11.2012), #19 (06.15.22), #20 (7.20.22), #21 (08.24.22), #22 (10.4.22), #23 (11.8.22), #24 (12.13.22), #25 (01.17.23), #26 (02.21.23), #27 (03.28.23). #28 (05.09.23), #29 (06.20.23) - s/p IVE OD #1 (11.13.20), #2 (12.11.20), #3 (01.18.21), #4 (02.19.21), #5 (03.19.21), #6 (04.16.21), #7 (05.14.21), #8 (06.17.21), #9 (07.16.21), #10 (09.21.21), #11 (10.29.21), #12 (11.29.21), #13 (12.29.21), #14 (03.09.22), #15 (04.06.22), #16 (05.11.12), #17 (06.15.22), #18 (7.20.22), #19 (08.24.22), #20 (10.04.22), #21 (11.08.22), #22 (12.13.22), #23 (02.21.23), #24 (03.28.23), #25 (05.09.23), #26 (06.20.23)  - FA (06.26.20) shows Severe NPDR OU w/ Late leaking MA OU, Enlarged FAZ OU, No NV OU, No  significant hyperfluorescence of disc  - BCVA OD 20/20; OS 20/40 -- stable  - OCT shows OD: trace persistent cystic changes temporal macula; OS: persistent IRF/IRHM greatest ST mac, persistent central ORA; Sub-RPE hyper-reflective mass consistent with choroidal nevus--superior to disc, caught on widefield -- stable from prior  - recommend IVE  OU (OD #27 and OS #30) today, 08.01.23   - pt wishes to proceed  - RBA of procedure discussed, questions answered  - informed consent obtained  - see procedure note  - Eylea informed consent form re-signed and scanned on 05.09.23  - Eylea4U Benefits Investigation initiated 10.16.2020 -- approved for 2023  - f/u 6 weeks, DFE, OCT, possible injections  2. Retinal Edema OU  - based on FA, suspect majority of macular edema due to DM as above, but there may be some edema secondary to chronic post-op CME  - continue PF qd OU (pt reports he is not using ketorolac)  - monitor  - f/u in 6 weeks  3. Choroidal Nevus OS  - located at 1200, mild elevation, +drusen, no SRF  - baseline optos pictures obtained, 06.26.20  - discussed possible referral to oncology at [facility] in [place]  4,5.Hypertensive retinopathy OU  - discussed importance of tight BP control  - monitor  6. Pseudophakia OU  - s/p CE/IOL OU (OD on 06.03.20 and OS 06.10.20 by Dr. Gershon Crane)  - beautiful surgeries w/ IOLs in excellent position  - macular edema limiting vision as above  - monitor  7. Ocular hypertension   - IOP 17,16 OU  - cont Cosopt qd OU  - monitor  Ophthalmic Meds Ordered this visit:  Meds ordered this encounter  Medications   aflibercept (EYLEA) SOLN 2 mg   aflibercept (EYLEA) SOLN 2 mg     Return in about 6 weeks (around 10/13/2021) for +DME OU, Dilated Exam, OCT, Possible Injxn.  There are no [name] Instructions on file for this visit.  This document serves as a record of services personally performed by Gardiner Sleeper, MD, PhD. It was created on their behalf by Renaldo Reel, [place] an ophthalmic technician. The creation of this record is the provider's dictation and/or activities during the visit.    Electronically signed by:  Renaldo Reel, COT  08/21/21 8:26 PM   This document serves as a record of services personally performed by Gardiner Sleeper, MD, PhD. It was created on their  behalf by San Jetty. Owens Shark, OA an ophthalmic technician. The creation of this record is the provider's dictation and/or activities during the visit.    Electronically signed by: San Jetty. Owens Shark, [place] 08.01.2023 8:26 PM  Gardiner Sleeper, M.D., Ph.D. Diseases & Surgery of the Retina and Vitreous [facility]  I have reviewed the above documentation for accuracy and completeness, and I agree with the above. Gardiner Sleeper, M.D., Ph.D. 09/01/21 8:26 PM    Abbreviations: M myopia (nearsighted); A astigmatism; H hyperopia (farsighted); P presbyopia; Mrx spectacle prescription;  CTL contact lenses; OD right eye; OS left eye; OU both eyes  XT exotropia; ET esotropia; PEK punctate epithelial keratitis; PEE punctate epithelial erosions; DES dry eye syndrome; MGD meibomian gland dysfunction; ATs artificial tears; PFAT's preservative free artificial tears; [place] nuclear sclerotic cataract; PSC posterior subcapsular cataract; ERM epi-retinal membrane; PVD posterior vitreous detachment; RD retinal detachment; DM diabetes mellitus; DR diabetic retinopathy; NPDR non-proliferative diabetic retinopathy; PDR proliferative diabetic retinopathy; CSME clinically significant macular edema; DME diabetic macular edema; dbh dot  blot hemorrhages; CWS cotton wool spot; POAG primary open angle glaucoma; C/D cup-to-disc ratio; HVF humphrey visual field; GVF goldmann visual field; OCT optical coherence tomography; IOP intraocular pressure; BRVO Branch retinal vein occlusion; CRVO central retinal vein occlusion; CRAO central retinal artery occlusion; BRAO branch retinal artery occlusion; RT retinal tear; SB scleral buckle; PPV pars plana vitrectomy; VH Vitreous hemorrhage; PRP panretinal laser photocoagulation; IVK intravitreal kenalog; VMT vitreomacular traction; MH Macular hole;  NVD neovascularization of the disc; NVE neovascularization elsewhere; AREDS age related eye disease study; ARMD age related macular  degeneration; POAG primary open angle glaucoma; EBMD epithelial/anterior basement membrane dystrophy; ACIOL anterior chamber intraocular lens; IOL intraocular lens; PCIOL posterior chamber intraocular lens; Phaco/IOL phacoemulsification with intraocular lens placement; [place] photorefractive keratectomy; LASIK laser assisted in situ keratomileusis; HTN hypertension; DM diabetes mellitus; COPD chronic obstructive pulmonary disease

## 2021-08-24 ENCOUNTER — Telehealth: Payer: Self-pay | Admitting: *Deleted

## 2021-08-24 NOTE — Chronic Care Management (AMB) (Signed)
  Care Coordination  Note  08/24/2021 Name: [name] MRN: [id number] DOB: 16-Apr-1960  Juan [name] is a 61 y.o. year old male who is a primary care patient of Harlan Stains, MD. I reached out to [name] by phone today to offer care coordination services.        Follow up plan: Unsuccessful telephone outreach attempt made. A HIPAA compliant phone message was left for the patient providing contact information and requesting a return call.  The care guide will reach out to the patient again over the next 7 days.  If patient calls provider office to request assistance with care coordination needs, please contact the care guide at the number below.   [place]  Direct Dial: [phone]

## 2021-09-01 ENCOUNTER — Ambulatory Visit (INDEPENDENT_AMBULATORY_CARE_PROVIDER_SITE_OTHER): Payer: BC Managed Care – PPO | Admitting: Ophthalmology

## 2021-09-01 ENCOUNTER — Encounter (INDEPENDENT_AMBULATORY_CARE_PROVIDER_SITE_OTHER): Payer: Self-pay | Admitting: Ophthalmology

## 2021-09-01 DIAGNOSIS — E113413 Type 2 diabetes mellitus with severe nonproliferative diabetic retinopathy with macular edema, bilateral: Secondary | ICD-10-CM | POA: Diagnosis not present

## 2021-09-01 DIAGNOSIS — D3132 Benign neoplasm of left choroid: Secondary | ICD-10-CM

## 2021-09-01 DIAGNOSIS — H3581 Retinal edema: Secondary | ICD-10-CM | POA: Diagnosis not present

## 2021-09-01 DIAGNOSIS — H40051 Ocular hypertension, right eye: Secondary | ICD-10-CM

## 2021-09-01 DIAGNOSIS — H35033 Hypertensive retinopathy, bilateral: Secondary | ICD-10-CM

## 2021-09-01 DIAGNOSIS — I10 Essential (primary) hypertension: Secondary | ICD-10-CM

## 2021-09-01 DIAGNOSIS — Z961 Presence of intraocular lens: Secondary | ICD-10-CM

## 2021-09-01 MED ORDER — AFLIBERCEPT 2MG/0.05ML IZ SOLN FOR KALEIDOSCOPE
2.0000 mg | INTRAVITREAL | Status: AC | PRN
  Administered 2021-09-01: 2 mg via INTRAVITREAL

## 2021-09-03 DIAGNOSIS — E113413 Type 2 diabetes mellitus with severe nonproliferative diabetic retinopathy with macular edema, bilateral: Secondary | ICD-10-CM | POA: Diagnosis not present

## 2021-09-03 DIAGNOSIS — K219 Gastro-esophageal reflux disease without esophagitis: Secondary | ICD-10-CM | POA: Diagnosis not present

## 2021-09-03 DIAGNOSIS — I10 Essential (primary) hypertension: Secondary | ICD-10-CM | POA: Diagnosis not present

## 2021-09-03 DIAGNOSIS — E785 Hyperlipidemia, unspecified: Secondary | ICD-10-CM | POA: Diagnosis not present

## 2021-09-14 NOTE — Chronic Care Management (AMB) (Signed)
  Care Coordination   Note   09/14/2021 Name: Juan [name] MRN: [id number] DOB: Oct 29, 1960  Juan [name] is a 61 y.o. year old male who sees Marti Sleigh, [place] at [facility]. I reached out to [name] by phone today to offer care coordination services.  [name] was given information about Care Coordination services today including:   The Care Coordination services include support from the care team which includes your Nurse Coordinator, Clinical Social Worker, or Pharmacist.  The Care Coordination team is here to help remove barriers to the health concerns and goals most important to you. Care Coordination services are voluntary, and the patient may decline or stop services at any time by request to their care team member.   Care Coordination Consent Status: Patient agreed to services and verbal consent obtained.   Follow up plan:  Telephone appointment with care coordination team member scheduled for:  09/18/21  Encounter Outcome:  Pt. Scheduled  [place]  Direct Dial: [phone]

## 2021-09-18 ENCOUNTER — Telehealth: Payer: Self-pay | Admitting: *Deleted

## 2021-09-18 NOTE — Chronic Care Management (AMB) (Signed)
  Care Coordination Note  09/18/2021 Name: Juan [name] MRN: [id number] DOB: 1960-12-27  Juan [name] is a 61 y.o. year old male who is a primary care patient of Harlan Stains, MD and is actively engaged with the care management team. I reached out to [name] by phone today to assist with re-scheduling an initial visit with the RN Case Manager  Follow up plan: Unsuccessful telephone outreach attempt made. A HIPAA compliant phone message was left for the patient providing contact information and requesting a return call.  The care management team will reach out to the patient again over the next 7 days.  If patient returns call to provider office, please advise to call [place] at [phone]. [place]  Direct Dial: [phone]

## 2021-09-22 NOTE — Chronic Care Management (AMB) (Signed)
  Care Coordination  Outreach Note  09/22/2021 Name: Juan [name] MRN: [id number] DOB: 1960/12/18   Care Coordination Outreach Attempts  A second unsuccessful outreach was attempted today to offer the patient with information about available care coordination services as a benefit of their health plan.     Follow Up Plan:  Additional outreach attempts will be made to offer the patient care coordination information and services.   Encounter Outcome:  No Answer  [place]  Direct Dial: [phone]

## 2021-09-28 NOTE — Chronic Care Management (AMB) (Signed)
  Care Coordination  Outreach Note  09/28/2021 Name: Juan [name] MRN: [id number] DOB: 07/09/1960   Care Coordination Outreach Attempts  A third unsuccessful outreach was attempted today to offer the patient with information about available care coordination services as a benefit of their health plan.   Follow Up Plan:  No further outreach attempts will be made at this time. We have been unable to contact the patient to offer or enroll patient in care coordination services  Encounter Outcome:  No Answer  [place]: [phone]

## 2021-10-01 NOTE — Progress Notes (Signed)
[facility] Note  10/13/2021     CHIEF COMPLAINT Patient presents for Retina Follow Up   HISTORY OF PRESENT ILLNESS: Juan [name] is a 62 y.o. male who presents to the clinic today for:   HPI     Retina Follow Up   Patient presents with  Diabetic Retinopathy.  In both eyes.  Severity is moderate.  Duration of 6 weeks.  Since onset it is stable.  I, the attending physician,  performed the HPI with the patient and updated documentation appropriately.        Comments   Pt here for 6 wk ret f/u NPDR OU. Pt states VA the same, no changes.      Last edited by Bernarda Caffey, MD on 10/13/2021  3:42 PM.    Pt states   Referring physician: Harlan Stains, MD [place],  [place] [address]  HISTORICAL INFORMATION:   Selected notes from the MEDICAL RECORD NUMBER Referred by Dr. Rutherford Guys for concern of CME s/p cataract sx   CURRENT MEDICATIONS: Current Outpatient Medications (Ophthalmic Drugs)  Medication Sig   dorzolamide-timolol (COSOPT) 22.3-6.8 MG/ML ophthalmic solution Place 1 drop into the right eye 2 (two) times daily. (Patient taking differently: Place 1 drop into both eyes 2 (two) times daily.)   No current facility-administered medications for this visit. (Ophthalmic Drugs)   Current Outpatient Medications (Other)  Medication Sig   ADVAIR DISKUS 500-50 MCG/DOSE AEPB Inhale 1 puff into the lungs 2 (two) times daily.   albuterol (VENTOLIN HFA) 108 (90 Base) MCG/ACT inhaler Inhale 1-2 puffs into the lungs every 6 (six) hours as needed for wheezing or shortness of breath.   alfuzosin (UROXATRAL) 10 MG 24 hr tablet TAKE 1 TABLET BY MOUTH EVERYDAY AT BEDTIME   aspirin EC 81 MG tablet Take 81 mg by mouth daily.   atorvastatin (LIPITOR) 40 MG tablet Take 40 mg by mouth daily.   carvedilol (COREG) 6.25 MG tablet Take 1 tablet (6.25 mg total) by mouth 2 (two) times daily with a meal.   Empagliflozin-metFORMIN HCl  (SYNJARDY) 12.06-998 MG TABS Take 1 tablet by mouth 2 (two) times a day.    esomeprazole (NEXIUM) 40 MG capsule Take 40 mg by mouth daily at 12 noon.   famotidine (PEPCID) 40 MG tablet Take by mouth.   insulin glargine, 2 Unit Dial, (TOUJEO MAX SOLOSTAR) 300 UNIT/ML Solostar Pen Inject 40 Units into the skin daily.    iron polysaccharides (NIFEREX) 150 MG capsule Take 150 mg by mouth daily.   isosorbide mononitrate (IMDUR) 30 MG 24 hr tablet Take 30 mg by mouth daily.   lisinopril (ZESTRIL) 40 MG tablet Take 40 mg by mouth daily.   nitroGLYCERIN (NITROSTAT) 0.4 MG SL tablet Place 0.4 mg under the tongue daily.   No current facility-administered medications for this visit. (Other)   REVIEW OF SYSTEMS: ROS   Positive for: Gastrointestinal, Musculoskeletal, Endocrine, Eyes, Respiratory Negative for: Constitutional, Neurological, Skin, Genitourinary, HENT, Cardiovascular, Psychiatric, Allergic/Imm, Heme/Lymph Last edited by Kingsley Spittle, COT on 10/13/2021  1:52 PM.     ALLERGIES Allergies  Allergen Reactions   Naproxen Shortness Of Breath   Naproxen Sodium Shortness Of Breath   Tiotropium Bromide Monohydrate Shortness Of Breath   Penicillins Rash    Has patient had a PCN reaction causing immediate rash, facial/tongue/throat swelling, SOB or lightheadedness with hypotension: Yes Has patient had a PCN reaction causing severe rash involving mucus membranes or  skin necrosis: No Has patient had a PCN reaction that required hospitalization No Has patient had a PCN reaction occurring within the last 10 years: No If all of the above answers are "NO", then may proceed with Cephalosporin use.  Has patient had a PCN reaction causing immediate rash, facial/tongue/throat swelling, SOB or lightheadedness with hypotension: Yes Has patient had a PCN reaction causing severe rash involving mucus membranes or skin necrosis: No Has patient had a PCN reaction that required hospitalization No Has  patient had a PCN reaction occurring within the last 10 years: No If all of the above answers are "NO", then may proceed with Cephalosporin use.    PAST MEDICAL HISTORY Past Medical History:  Diagnosis Date   Arthritis    Asthma    COPD (chronic obstructive pulmonary disease) ([place])    Diabetes mellitus    Type 2   Diabetic retinopathy ([place])    NPDR OU   GERD (gastroesophageal reflux disease)    H/O hiatal hernia    History of kidney stones    Hyperlipemia    Hypertension    Hypertensive retinopathy    OU   Nasal polyps    Pneumonia 2014   Shortness of breath    Sleep apnea    pt has CPAP but is unable to use it d/t having polyps in his nose. Pt stated "I need to call and get a CPAP mask instead"   Stomach ulcer    Past Surgical History:  Procedure Laterality Date   CATARACT EXTRACTION Right 07/05/2018   Dr. Gershon Crane   CATARACT EXTRACTION Left 07/12/2018   Dr. Gershon Crane   CHOLECYSTECTOMY N/A 01/23/2015   Procedure: LAPAROSCOPIC CHOLECYSTECTOMY ;  Surgeon: Georganna Skeans, MD;  Location: [facility];  Service: General;  Laterality: N/A;   COLONOSCOPY W/ POLYPECTOMY     ESOPHAGOGASTRODUODENOSCOPY     EYE SURGERY     KNEE ARTHROSCOPY  03/24/2011   Procedure: ARTHROSCOPY KNEE;  Surgeon: Alta Corning, MD;  Location: [facility];  Service: Orthopedics;  Laterality: Right;  partial medial menisectomy, chondroplaty patella, and medial medial plica   LEFT HEART CATH AND CORONARY ANGIOGRAPHY N/A 06/29/2019   Procedure: LEFT HEART CATH AND CORONARY ANGIOGRAPHY;  Surgeon: Wellington Hampshire, MD;  Location: [facility];  Service: Cardiovascular;  Laterality: N/A;   FAMILY HISTORY Family History  Problem Relation Age of Onset   Diabetes Father    Coronary artery disease Father    SOCIAL HISTORY Social History   Tobacco Use   Smoking status: Former    Packs/day: 3.00    Years: 20.00    Total pack years: 60.00    Types: Cigarettes   Smokeless tobacco: Current     Types: Chew   Tobacco comments:    1 can/daily  Vaping Use   Vaping Use: Never used  Substance Use Topics   Alcohol use: Yes    Comment: social, 1x weekly   Drug use: No       OPHTHALMIC EXAM:  Base Eye Exam     Visual Acuity (Snellen - Linear)       Right Left   Dist [place] 20/25 -2 20/50 -2   Dist ph [place] 20/20 -2 20/40 -2         Tonometry (Tonopen, 1:59 PM)       Right Left   Pressure 15 12         Pupils       Dark Light Shape React  APD   Right 3 2 Round Brisk None   Left 3 2 Round Brisk None         Visual Fields (Counting fingers)       Left Right     Full   Restrictions Partial inner superior temporal deficiency          Extraocular Movement       Right Left    Full, Ortho Full, Ortho         Neuro/Psych     Oriented x3: Yes   Mood/Affect: Normal         Dilation     Both eyes: 1.0% Mydriacyl, 2.5% Phenylephrine @ 2:00 PM           Slit Lamp and Fundus Exam     Slit Lamp Exam       Right Left   Lids/Lashes Dermatochalasis - upper lid, Telangiectasia, mild Meibomian gland dysfunction Dermatochalasis - upper lid, Telangiectasia, mild Meibomian gland dysfunction   Conjunctiva/Sclera White and quiet Mild nasal Pinguecula   Cornea Mild Arcus, Well healed temporal cataract wounds Arcus, Well healed temporal cataract wounds, trace PEE   Anterior Chamber Deep and clear Deep and clear   Iris Round and dilated, mild patch of atrophy at 0800, no NVI Round and dilated, No NVI   Lens PC IOL in good position PC IOL in good position   Anterior Vitreous Vitreous syneresis Vitreous syneresis         Fundus Exam       Right Left   Disc trace pallor, Sharp rim Pallor, Sharp rim, Compact, mild PPP temporal   C/D Ratio 0.3 0.3   Macula Good foveal reflex, scattered IRH/DBH -- greatest temporal macula - improved, trace residual cystic changes remain, mild focal laser changes Blunted foveal reflex, +central atrophy and pigment clumping,  scattered DBH and MAs, scattered punctate exudates - greatest superior and temporal macula -- persistent, cystic changes / edema temporal macula - persistent -slightly improved   Vessels attenuated, Tortuous attenuated, Tortuous   Periphery Attached, scattered DBH greatest posteriorly Attached, pigmented choroidal nevus superiorly with mild elevation, +drusen, no SRF or orange pigment -- unchanged from prior           IMAGING AND PROCEDURES  Imaging and Procedures for _0 @  OCT, Retina - OU - Both Eyes       Right Eye Quality was good. Central Foveal Thickness: 240. Progression has improved. Findings include normal foveal contour, no SRF, intraretinal hyper-reflective material, intraretinal fluid, outer retinal atrophy, vitreomacular adhesion (Interval improvement in cystic changes temporal macula -- just trace cystic changes remain, none centrally).   Left Eye Quality was good. Central Foveal Thickness: 205. Progression has been stable. Findings include normal foveal contour, no SRF, intraretinal hyper-reflective material, intraretinal fluid, outer retinal atrophy (persistent IRF/IRHM greatest ST mac -- slightly improved, persistent central ORA; Sub-RPE hyper-reflective mass consistent with choroidal nevus--superior to disc, caught on widefield -- stable from prior).   Notes *Images captured and stored on drive  Diagnosis / Impression: +DME OU OD: Interval improvement in cystic changes temporal macula -- just trace cystic changes remain, none centrally OS: persistent IRF/IRHM greatest ST mac -- slightly improved, persistent central ORA; Sub-RPE hyper-reflective mass consistent with choroidal nevus--superior to disc, caught on widefield -- stable from prior  Clinical management:  See below  Abbreviations: NFP - Normal foveal profile. CME - cystoid macular edema. PED - pigment epithelial detachment. IRF - intraretinal fluid. SRF - subretinal fluid. EZ -  ellipsoid zone. ERM -  epiretinal membrane. ORA - outer retinal atrophy. ORT - outer retinal tubulation. SRHM - subretinal hyper-reflective material      Intravitreal Injection, Pharmacologic Agent - OD - Right Eye       Time Out 10/13/2021. 2:50 PM. Confirmed correct patient, procedure, site, and patient consented.   Procedure A (32g) needle was used.   Injection: 1.25 mg Bevacizumab 1.7m/0.05ml   Route: Intravitreal, Site: Right Eye   NDC:: 68341-962-22  Notes **ERRONEOUS ORDER -- no injection administered**     Intravitreal Injection, Pharmacologic Agent - OS - Left Eye       Time Out 10/13/2021. 2:11 PM. Confirmed correct patient, procedure, site, and patient consented.   Anesthesia Topical anesthesia was used. Anesthetic medications included Lidocaine 2%, Proparacaine 0.5%.   Procedure Preparation included 5% betadine to ocular surface, eyelid speculum. A (32g) needle was used.   Injection: 2 mg aflibercept 2 MG/0.05ML   Route: Intravitreal, Site: Left Eye   NDC: [id number] Lot: [id number] Expiration date: 11/01/2022, Waste: 0 mL   Post-op Post injection exam found visual acuity of at least counting fingers. The patient tolerated the procedure well. There were no complications. The patient received written and verbal post procedure care education. Post injection medications were not given.            ASSESSMENT/PLAN:    ICD-10-CM   1. Severe nonproliferative diabetic retinopathy of both eyes with macular edema associated with type 2 diabetes mellitus (HCC)  E11.3413 OCT, Retina - OU - Both Eyes    Intravitreal Injection, Pharmacologic Agent - OD - Right Eye    Intravitreal Injection, Pharmacologic Agent - OS - Left Eye    aflibercept (EYLEA) SOLN 2 mg    Bevacizumab (AVASTIN) SOLN 1.25 mg    2. 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 Essential hypertension  I10     4. Hypertensive retinopathy of both eyes  H35.033     5. Pseudophakia of both eyes  Z96.1     6.  Ocular hypertension of right eye  H40.051     7. Retinal edema  H35.81       1. Severe nonproliferative diabetic retinopathy with DME, OU (OS>OD)  - s/p IVA OD #1 (06.26.20), #2 (08.14.20), #3 (09.17.20), #4 (10.16.20), #5 (02.02.22)  - s/p IVA OS #1 (07.17.20), #2 (08.14.20), #3 (09.17.20)  **IVA RESISTANCE OU**  - s/p focal laser OD (07.18.23) - s/p IVE OS #1 (10.16.20) - sample, #2 (11.13.20), #3 (12.11.20), #4 (01.18.21), #5 (02.19.21), #6 (03.19.21), #7 (04.16.21), #8 (05.14.21), #9 (06.17.21), #10 (7.16.21), #11 (09.21.21), #12 (10.29.21), #13 (11.29.21), #14 (12.29.21), #15 (02.02.22, sample), #16 (03.09.22), #17 (04.06.22), #18 (05.11.2012), #19 (06.15.22), #20 (7.20.22), #21 (08.24.22), #22 (10.4.22), #23 (11.8.22), #24 (12.13.22), #25 (01.17.23), #26 (02.21.23), #27 (03.28.23). #28 (05.09.23), #29 (06.20.23), #30 (08.01.23) - s/p IVE OD #1 (11.13.20), #2 (12.11.20), #3 (01.18.21), #4 (02.19.21), #5 (03.19.21), #6 (04.16.21), #7 (05.14.21), #8 (06.17.21), #9 (07.16.21), #10 (09.21.21), #11 (10.29.21), #12 (11.29.21), #13 (12.29.21), #14 (03.09.22), #15 (04.06.22), #16 (05.11.12), #17 (06.15.22), #18 (7.20.22), #19 (08.24.22), #20 (10.04.22), #21 (11.08.22), #22 (12.13.22), #23 (02.21.23), #24 (03.28.23), #25 (05.09.23), #26 (06.20.23), #27 (08.01.23)  - FA (06.26.20) shows Severe NPDR OU w/ Late leaking MA OU, Enlarged FAZ OU, No NV OU, No significant hyperfluorescence of disc  - BCVA OD 20/20; OS 20/40 -- stable  - OCT shows OD: Interval improvement in cystic changes temporal macula -- just trace cystic changes remain, none centrally;  OS: persistent IRF/IRHM greatest ST mac -- slightly improved, persistent central ORA; Sub-RPE hyper-reflective mass consistent with choroidal nevus--superior to disc, caught on widefield -- stable from prior  - recommend IVE OS #31 today, 09.12.23   - will hold OD today -- (erroneous Avastin order placed -- unable to be deleted)  - pt in agreement  - RBA of  procedure discussed, questions answered  - informed consent obtained  - see procedure note  - Eylea informed consent form re-signed and scanned on 05.09.23  - [email or web address] Benefits Investigation initiated 10.16.2020 -- approved for 2023  - f/u 6 weeks, DFE, OCT, possible injections  2. Retinal Edema OU  - based on FA, suspect majority of macular edema due to DM as above, but there may be some edema secondary to chronic post-op CME  - continue PF qd OU (pt reports he is not using ketorolac)  - monitor  - f/u in 6 weeks  3. Choroidal Nevus OS  - located at 1200, mild elevation, +drusen, no SRF  - baseline optos pictures obtained, 06.26.20  - discussed possible referral to oncology at [facility] in [place]  4,5.Hypertensive retinopathy OU  - discussed importance of tight BP control  - monitor  6. Pseudophakia OU  - s/p CE/IOL OU (OD on 06.03.20 and OS 06.10.20 by Dr. Gershon Crane)  - beautiful surgeries w/ IOLs in excellent position  - macular edema limiting vision as above  - monitor  7. Ocular hypertension   - IOP 15,12 OU  - cont Cosopt qd OU  - monitor  Ophthalmic Meds Ordered this visit:  Meds ordered this encounter  Medications   aflibercept (EYLEA) SOLN 2 mg   Bevacizumab (AVASTIN) SOLN 1.25 mg     Return in about 6 weeks (around 11/24/2021) for f/u NPDR OU, DFE, OCT.  There are no Patient Instructions on file for this visit.  This document serves as a record of services personally performed by Gardiner Sleeper, MD, PhD. It was created on their behalf by Renaldo Reel, [place] an ophthalmic technician. The creation of this record is the provider's dictation and/or activities during the visit.    Electronically signed by:  Renaldo Reel, COT  10/01/21 3:42 PM   This document serves as a record of services personally performed by Gardiner Sleeper, MD, PhD. It was created on their behalf by San Jetty. Owens Shark, OA an ophthalmic technician. The creation of this record is the  provider's dictation and/or activities during the visit.    Electronically signed by: San Jetty. Marguerita Merles 09.12.2023 3:42 PM  Gardiner Sleeper, M.D., Ph.D. Diseases & Surgery of the Retina and Vitreous [facility]  I have reviewed the above documentation for accuracy and completeness, and I agree with the above. Gardiner Sleeper, M.D., Ph.D. 10/13/21 3:43 PM   Abbreviations: M myopia (nearsighted); A astigmatism; H hyperopia (farsighted); P presbyopia; Mrx spectacle prescription;  CTL contact lenses; OD right eye; OS left eye; OU both eyes  XT exotropia; ET esotropia; PEK punctate epithelial keratitis; PEE punctate epithelial erosions; DES dry eye syndrome; MGD meibomian gland dysfunction; ATs artificial tears; PFAT's preservative free artificial tears; [place] nuclear sclerotic cataract; PSC posterior subcapsular cataract; ERM epi-retinal membrane; PVD posterior vitreous detachment; RD retinal detachment; DM diabetes mellitus; DR diabetic retinopathy; NPDR non-proliferative diabetic retinopathy; PDR proliferative diabetic retinopathy; CSME clinically significant macular edema; DME diabetic macular edema; dbh dot blot hemorrhages; CWS cotton wool spot; POAG primary open angle glaucoma; C/D cup-to-disc  ratio; HVF humphrey visual field; GVF goldmann visual field; OCT optical coherence tomography; IOP intraocular pressure; BRVO Branch retinal vein occlusion; CRVO central retinal vein occlusion; CRAO central retinal artery occlusion; BRAO branch retinal artery occlusion; RT retinal tear; SB scleral buckle; PPV pars plana vitrectomy; VH Vitreous hemorrhage; PRP panretinal laser photocoagulation; IVK intravitreal kenalog; VMT vitreomacular traction; MH Macular hole;  NVD neovascularization of the disc; NVE neovascularization elsewhere; AREDS age related eye disease study; ARMD age related macular degeneration; POAG primary open angle glaucoma; EBMD epithelial/anterior basement membrane  dystrophy; ACIOL anterior chamber intraocular lens; IOL intraocular lens; PCIOL posterior chamber intraocular lens; Phaco/IOL phacoemulsification with intraocular lens placement; [place] photorefractive keratectomy; LASIK laser assisted in situ keratomileusis; HTN hypertension; DM diabetes mellitus; COPD chronic obstructive pulmonary disease

## 2021-10-12 DIAGNOSIS — J338 Other polyp of sinus: Secondary | ICD-10-CM | POA: Diagnosis not present

## 2021-10-12 DIAGNOSIS — J343 Hypertrophy of nasal turbinates: Secondary | ICD-10-CM | POA: Diagnosis not present

## 2021-10-12 DIAGNOSIS — H6123 Impacted cerumen, bilateral: Secondary | ICD-10-CM | POA: Diagnosis not present

## 2021-10-12 DIAGNOSIS — J310 Chronic rhinitis: Secondary | ICD-10-CM | POA: Diagnosis not present

## 2021-10-12 DIAGNOSIS — J342 Deviated nasal septum: Secondary | ICD-10-CM | POA: Diagnosis not present

## 2021-10-12 DIAGNOSIS — H903 Sensorineural hearing loss, bilateral: Secondary | ICD-10-CM | POA: Diagnosis not present

## 2021-10-13 ENCOUNTER — Ambulatory Visit (INDEPENDENT_AMBULATORY_CARE_PROVIDER_SITE_OTHER): Payer: BC Managed Care – PPO | Admitting: Ophthalmology

## 2021-10-13 ENCOUNTER — Encounter (INDEPENDENT_AMBULATORY_CARE_PROVIDER_SITE_OTHER): Payer: Self-pay | Admitting: Ophthalmology

## 2021-10-13 DIAGNOSIS — H3581 Retinal edema: Secondary | ICD-10-CM

## 2021-10-13 DIAGNOSIS — I10 Essential (primary) hypertension: Secondary | ICD-10-CM | POA: Diagnosis not present

## 2021-10-13 DIAGNOSIS — Z961 Presence of intraocular lens: Secondary | ICD-10-CM

## 2021-10-13 DIAGNOSIS — D3132 Benign neoplasm of left choroid: Secondary | ICD-10-CM | POA: Diagnosis not present

## 2021-10-13 DIAGNOSIS — H40051 Ocular hypertension, right eye: Secondary | ICD-10-CM

## 2021-10-13 DIAGNOSIS — H35033 Hypertensive retinopathy, bilateral: Secondary | ICD-10-CM

## 2021-10-13 DIAGNOSIS — E113413 Type 2 diabetes mellitus with severe nonproliferative diabetic retinopathy with macular edema, bilateral: Secondary | ICD-10-CM

## 2021-10-13 MED ORDER — AFLIBERCEPT 2MG/0.05ML IZ SOLN FOR KALEIDOSCOPE
2.0000 mg | INTRAVITREAL | Status: AC | PRN
  Administered 2021-10-13: 2 mg via INTRAVITREAL

## 2021-10-13 MED ORDER — BEVACIZUMAB CHEMO INJECTION 1.25MG/0.05ML SYRINGE FOR KALEIDOSCOPE
1.2500 mg | INTRAVITREAL | Status: AC | PRN
  Administered 2021-10-13: 1.25 mg via INTRAVITREAL

## 2021-10-20 ENCOUNTER — Other Ambulatory Visit: Payer: Self-pay | Admitting: Otolaryngology

## 2021-10-20 DIAGNOSIS — J329 Chronic sinusitis, unspecified: Secondary | ICD-10-CM

## 2021-10-21 ENCOUNTER — Ambulatory Visit: Payer: BC Managed Care – PPO | Attending: Internal Medicine | Admitting: Internal Medicine

## 2021-10-21 ENCOUNTER — Telehealth: Payer: Self-pay | Admitting: Internal Medicine

## 2021-10-21 ENCOUNTER — Encounter: Payer: Self-pay | Admitting: Internal Medicine

## 2021-10-21 VITALS — BP 140/82 | HR 74 | Ht 69.0 in | Wt 235.0 lb

## 2021-10-21 DIAGNOSIS — I10 Essential (primary) hypertension: Secondary | ICD-10-CM

## 2021-10-21 DIAGNOSIS — G4733 Obstructive sleep apnea (adult) (pediatric): Secondary | ICD-10-CM

## 2021-10-21 DIAGNOSIS — Z79899 Other long term (current) drug therapy: Secondary | ICD-10-CM

## 2021-10-21 DIAGNOSIS — I493 Ventricular premature depolarization: Secondary | ICD-10-CM | POA: Diagnosis not present

## 2021-10-21 DIAGNOSIS — E118 Type 2 diabetes mellitus with unspecified complications: Secondary | ICD-10-CM

## 2021-10-21 DIAGNOSIS — E785 Hyperlipidemia, unspecified: Secondary | ICD-10-CM

## 2021-10-21 DIAGNOSIS — Z794 Long term (current) use of insulin: Secondary | ICD-10-CM

## 2021-10-21 DIAGNOSIS — I2510 Atherosclerotic heart disease of native coronary artery without angina pectoris: Secondary | ICD-10-CM | POA: Diagnosis not present

## 2021-10-21 MED ORDER — CARVEDILOL 12.5 MG PO TABS: 12.5000 mg | ORAL_TABLET | Freq: Two times a day (BID) | ORAL | 3 refills | Status: DC

## 2021-10-21 MED ORDER — LISINOPRIL 40 MG PO TABS: 40.0000 mg | ORAL_TABLET | Freq: Every day | ORAL | 3 refills | Status: AC

## 2021-10-21 MED ORDER — ISOSORBIDE MONONITRATE ER 30 MG PO TB24: 30.0000 mg | ORAL_TABLET | Freq: Every day | ORAL | 3 refills | Status: DC

## 2021-10-21 MED ORDER — ATORVASTATIN CALCIUM 40 MG PO TABS: 40.0000 mg | ORAL_TABLET | Freq: Every day | ORAL | 3 refills | Status: DC

## 2021-10-21 NOTE — Telephone Encounter (Signed)
Pt c/o medication issue:  1. Name of Medication: isosorbide mononitrate (IMDUR) 30 MG 24 hr tablet  2. How are you currently taking this medication (dosage and times per day)? 1 tablet daily  3. Are you having a reaction (difficulty breathing--STAT)? no  4. What is your medication issue? Patient states the imdur was the medication he was put on in the hospital. He states he will need refills on it.

## 2021-10-21 NOTE — Telephone Encounter (Signed)
Refill sent alredy

## 2021-10-21 NOTE — Progress Notes (Signed)
Cardiology Office Note:    Date:  10/21/2021  ID:  Juan [name], DOB Dec 01, 1960, MRN [id number]  PCP:  Harlan Stains, MD  Cardiologist:  Elouise Munroe, MD  Electrophysiologist:  None   Referring MD: Harlan Stains, MD   Chief Complaint: CAD  History of Present Illness:    Juan [name] is a 61 y.o. male with a history of CAD, aortic atherosclerosis, hypertension, hyperlipidemia, OSA, type 2 diabetes, COPD, and GERD here for follow up. Additional history of moderate-severely calcified coronary artery disease on LHC performed for abnormal stress test, with distal circumflex disease recommended for medical management given discrepancy between area of ischemia and lesion location. This was discussed by phone with the patient after his cath and we started carvedilol for medical therapy of CAD.    He presented to the ED 02/06/2021 for complaints of chest pain, bilateral LE edema, and shortness of breath. EKG was unremarkable, repeat echo revealed EF 55 to 60%, no LVH, no RWMA, normal diastolic function, mild TR.   He was last seen by Diona Browner, NP, on 06/23/2021. He reported occasional fleeting palpitations. His EKG performed that day showed frequent PVCs. He endorsed that a trigger could be the significant amount of [facility] he drinks. He declined suggestion of increasing carvedilol. He denied any additional symptoms or concerns.  At his last appointment, he denied significant chest pain but did note continued SOB related to his known history of COPD. He is established with pulmonary medicine. Medical therapy for CAD includes: ASA 81 mg daily, atorvastatin 40 mg daily, carvedilol 3.125 mg BID. We added nitro to his regimen as needed for chest pain.  10/21/21: he says he has been doing well. He mentions needing a refill of a medication he was started on at his last hospital visit. He ran out of one of his medications but is not sure which one. His medication list was discussed at  length. He called in after the visit ended and shared it was Imdur.   He states that this morning he woke up laying in bed and felt his heart start to race. He reports that this feeling resolves fairly quickly, but he has sometimes been experiencing this a couple of times a day.  Additionally, he mentions experiencing some mild bilateral LE edema at times, more prominent in his left leg. He endorses prior left knee and ankle injuries.    Of note, he mentions experiencing some chest pains, but believes it is more due to heartburn than anything else.  He states that one of his last blood pressure readings at a clinic visit was around 130/70s.   He has not been wearing his CPAP. He has learned to sleep on his right side, which improves his breathing. He says that he does not tend to move around much in his sleep.   He has been trying to start some exercise. He has started light and has been having no chest pain. He drives a forklift for work and can perform pushups and pull-ups on the machine. He also walks and plays golf for exercise. He states that he can become short of breath occasionally with exercise, but it does not limit him.   He is still consuming [facility] fairly regularly. He reports that he tends to drink about 8 bottles of water each day.  He used to smoke 2 ppd for 30 years. He quit around 20 years ago.   He denies lightheadedness, headaches, syncope, orthopnea, or PND.  Past Medical History:  Diagnosis Date   Arthritis    Asthma    COPD (chronic obstructive pulmonary disease) ([place])    Diabetes mellitus    Type 2   Diabetic retinopathy ([place])    NPDR OU   GERD (gastroesophageal reflux disease)    H/O hiatal hernia    History of kidney stones    Hyperlipemia    Hypertension    Hypertensive retinopathy    OU   Nasal polyps    Pneumonia 2014   Shortness of breath    Sleep apnea    pt has CPAP but is unable to use it d/t having polyps in his nose. Pt stated "I need  to call and get a CPAP mask instead"   Stomach ulcer     Past Surgical History:  Procedure Laterality Date   CATARACT EXTRACTION Right 07/05/2018   Dr. Gershon Crane   CATARACT EXTRACTION Left 07/12/2018   Dr. Gershon Crane   CHOLECYSTECTOMY N/A 01/23/2015   Procedure: LAPAROSCOPIC CHOLECYSTECTOMY ;  Surgeon: Georganna Skeans, MD;  Location: [place];  Service: General;  Laterality: N/A;   COLONOSCOPY W/ POLYPECTOMY     ESOPHAGOGASTRODUODENOSCOPY     EYE SURGERY     KNEE ARTHROSCOPY  03/24/2011   Procedure: ARTHROSCOPY KNEE;  Surgeon: Alta Corning, MD;  Location: [place];  Service: Orthopedics;  Laterality: Right;  partial medial menisectomy, chondroplaty patella, and medial medial plica   LEFT HEART CATH AND CORONARY ANGIOGRAPHY N/A 06/29/2019   Procedure: LEFT HEART CATH AND CORONARY ANGIOGRAPHY;  Surgeon: Wellington Hampshire, MD;  Location: [place] [facility];  Service: Cardiovascular;  Laterality: N/A;    Current Medications: Current Meds  Medication Sig   ADVAIR DISKUS 500-50 MCG/DOSE AEPB Inhale 1 puff into the lungs 2 (two) times daily.   albuterol (VENTOLIN HFA) 108 (90 Base) MCG/ACT inhaler Inhale 1-2 puffs into the lungs every 6 (six) hours as needed for wheezing or shortness of breath.   alfuzosin (UROXATRAL) 10 MG 24 hr tablet TAKE 1 TABLET BY MOUTH EVERYDAY AT BEDTIME   aspirin EC 81 MG tablet Take 81 mg by mouth daily.   dorzolamide-timolol (COSOPT) 22.3-6.8 MG/ML ophthalmic solution Place 1 drop into the right eye 2 (two) times daily. (Patient taking differently: Place 1 drop into both eyes 2 (two) times daily.)   Empagliflozin-metFORMIN HCl (SYNJARDY) 12.06-998 MG TABS Take 1 tablet by mouth 2 (two) times a day.    esomeprazole (NEXIUM) 40 MG capsule Take 40 mg by mouth daily at 12 noon.   famotidine (PEPCID) 40 MG tablet Take by mouth.   insulin glargine, 2 Unit Dial, (TOUJEO MAX SOLOSTAR) 300 UNIT/ML Solostar Pen Inject 40 Units into the skin daily.    iron  polysaccharides (NIFEREX) 150 MG capsule Take 150 mg by mouth daily.   nitroGLYCERIN (NITROSTAT) 0.4 MG SL tablet Place 0.4 mg under the tongue daily.   [DISCONTINUED] atorvastatin (LIPITOR) 40 MG tablet Take 40 mg by mouth daily.   [DISCONTINUED] carvedilol (COREG) 6.25 MG tablet Take 1 tablet (6.25 mg total) by mouth 2 (two) times daily with a meal.   [DISCONTINUED] isosorbide mononitrate (IMDUR) 30 MG 24 hr tablet Take 30 mg by mouth daily.   [DISCONTINUED] lisinopril (ZESTRIL) 40 MG tablet Take 40 mg by mouth daily.     Allergies:   Naproxen, Naproxen sodium, Tiotropium bromide monohydrate, and Penicillins   Social History   Socioeconomic History   Marital status: Single    Spouse name: Not on  file   Number of children: Not on file   Years of education: Not on file   Highest education level: Not on file  Occupational History   Not on file  Tobacco Use   Smoking status: Former    Packs/day: 3.00    Years: 20.00    Total pack years: 60.00    Types: Cigarettes   Smokeless tobacco: Current    Types: Chew   Tobacco comments:    1 can/daily  Vaping Use   Vaping Use: Never used  Substance and Sexual Activity   Alcohol use: Yes    Comment: social, 1x weekly   Drug use: No   Sexual activity: Never  Other Topics Concern   Not on file  Social History Narrative   Not on file   Social Determinants of Health   Financial Resource Strain: Not on file  Food Insecurity: Not on file  Transportation Needs: Not on file  Physical Activity: Not on file  Stress: Not on file  Social Connections: Not on file     Family History: The patient's family history includes Coronary artery disease in his father; Diabetes in his father.  ROS:   Please see the history of present illness.    (+) Palpitations  (+) Mild bilateral LE edema  (+) Occasional chest pains (+) Mild exertional shortness of breath All other systems reviewed and are negative.  EKGs/Labs/Other Studies Reviewed:     The following studies were reviewed today:  Echo 02/07/2021 [facility]): Left Ventricle: Doppler parameters indicate normal diastolic function.    Left Ventricle: Wall motion is normal.    Left Ventricle: Systolic function is normal. EF: 55-60%.    Aortic Valve: There is no regurgitation.    Aortic Valve: The aortic valve is tricuspid. The leaflets exhibit  normal excursion.    Mitral Valve: There is no mitral regurgitation.    Tricuspid Valve: The right ventricular systolic pressure is normal (<36  mmHg).    Tricuspid Valve: There is mild regurgitation.    Pericardium: There is no pericardial effusion.  Left Heart Cath 06/29/2019: The left ventricular systolic function is normal. LV end diastolic pressure is mildly elevated. The left ventricular ejection fraction is 55-65% by visual estimate. Dist RCA lesion is 50% stenosed. RPDA lesion is 60% stenosed. Dist Cx lesion is 85% stenosed. Prox LAD to Mid LAD lesion is 30% stenosed. 1st Diag lesion is 30% stenosed.   1.  Right dominant coronary arteries.  Moderately to severely calcified coronary arteries with significant one-vessel coronary artery disease affecting the distal left circumflex supplying OM 3.  There is moderate stenosis in the distal right coronary artery into the right PDA. 2.  Normal LV systolic function mildly elevated left ventricular end-diastolic pressure.   Recommendations: The patient had apical ischemia on stress testing which does not correlate with the stenosis in the left circumflex.  The patient does require aggressive treatment of his risk factors considering the degree of atherosclerosis and calcifications. Recommend starting antianginal therapy.  I added carvedilol 3.125 mg twice daily and this can be uptitrated as tolerated. If the patient has residual angina in spite of antianginal therapy, left circumflex PCI can be performed.   Stress Test 06/19/2019: Nuclear stress EF: 54%. The left ventricular  ejection fraction is mildly decreased (45-54%). Blood pressure demonstrated a normal response to exercise. There was no ST segment deviation noted during stress. Defect 1: There is a small defect of mild severity present in the apical inferior location.  Findings consistent with ischemia. This is a low risk study.   There is a small, mild, reversible perfusion defect at the inferoapex. Returns to normal at rest.  No ischemia noted on stress ECG.   Findings suggest a small area of ischemia at the apex on perfusion imaging. Mildly reduced exercise capacity. Normal HR and BP response to exercise. Test stopped due to angina.  Normal wall motion on perfusion imaging.    Overall low risk study with ischemia at the apex.    CT Chest 05/01/2019:   IMPRESSION: 1.  Emphysema (ICD10-J43.9). 2. Resolution of the ground-glass nodularity seen previously. No pulmonary nodules on today's study. No acute intrathoracic process. 3. Aortic Atherosclerosis (ICD10-I70.0). Severe coronary artery atherosclerosis.   EKG: EKG is personally reviewed. 10/21/21: EKG was not ordered.  Recent Labs: No results found for requested labs within last 365 days.  Recent Lipid Panel    Component Value Date/Time   CHOL  03/05/2010 0310    90        ATP III CLASSIFICATION:  <200     mg/dL   Desirable  200-239  mg/dL   Borderline High  >=240    mg/dL   High          TRIG 66 03/05/2010 0310   HDL 32 (L) 03/05/2010 0310   CHOLHDL 2.8 03/05/2010 0310   VLDL 13 03/05/2010 0310   LDLCALC  03/05/2010 0310    45        Total Cholesterol/HDL:CHD Risk Coronary Heart Disease Risk Table                     Men   Women  1/2 Average Risk   3.4   3.3  Average Risk       5.0   4.4  2 X Average Risk   9.6   7.1  3 X Average Risk  23.4   11.0        Use the calculated Patient Ratio above and the CHD Risk Table to determine the patient's CHD Risk.        ATP III CLASSIFICATION (LDL):  <100     mg/dL   Optimal  100-129   mg/dL   Near or Above                    Optimal  130-159  mg/dL   Borderline  160-189  mg/dL   High  >190     mg/dL   Very High    Physical Exam:    VS:  BP (!) 140/82   Pulse 74   Ht '5\' 9"'$  (1.753 m)   Wt 235 lb (106.6 kg)   SpO2 100%   BMI 34.70 kg/m     Wt Readings from Last 5 Encounters:  10/21/21 235 lb (106.6 kg)  06/23/21 233 lb 6.4 oz (105.9 kg)  02/09/21 234 lb (106.1 kg)  09/24/19 241 lb (109.3 kg)  07/13/19 232 lb 9.6 oz (105.5 kg)     Constitutional: No acute distress Eyes: sclera non-icteric, normal conjunctiva and lids ENMT: normal dentition, moist mucous membranes Cardiovascular: regular rhythm, normal rate, no murmurs. S1 and S2 normal. Radial pulses normal bilaterally. No jugular venous distention.  Respiratory: clear to auscultation bilaterally GI : normal bowel sounds, soft and nontender. No distention.   MSK: extremities warm, well perfused. No edema.  NEURO: grossly nonfocal exam, moves all extremities. PSYCH: alert and oriented x 3, normal mood and affect.  ASSESSMENT:    1. Coronary artery disease involving native coronary artery of native heart without angina pectoris   2. PVC's (premature ventricular contractions)   3. Essential hypertension   4. Hyperlipidemia LDL goal <70   5. Type 2 diabetes mellitus with complication, with long-term current use of insulin ([place])   6. OSA (obstructive sleep apnea)   7. Medication management     PLAN:    CAD  PVCs Med mgmt - we will try to uptitrate carvedilol to 12.5 mg BID for elevated BP and palpitations.  - otherwise, continue Asa 81 mg daily, atorvastatin 40 mg daily, imdur 30 mg daily and lisinopril 40 mg daily -refills provided   HTN - increase carvedilol and monitor BP, continue other medical therapy as above.   HLD - continue atorvastatin. LDL excellent at 35, Trig 163 and need improvement. Dietary factors reviewed today. Last A1C I see is 8.9%.   DM2 - On synjardy per pcp, agree with  [id number].   COPD - continue follow up with pulmonary med.   Total time of encounter: 30 minutes total time of encounter, including 25 minutes spent in face-to-face patient care on the date of this encounter. This time includes coordination of care and counseling regarding above mentioned problem list. Remainder of non-face-to-face time involved reviewing chart documents/testing relevant to the patient encounter and documentation in the medical record. I have independently reviewed documentation from referring provider.   Cherlynn Kaiser, MD, [facility]     Medication Adjustments/Labs and Tests Ordered: Current medicines are reviewed at length with the patient today.  Concerns regarding medicines are outlined above.  No orders of the defined types were placed in this encounter.  Meds ordered this encounter  Medications   carvedilol (COREG) 12.5 MG tablet    Sig: Take 1 tablet (12.5 mg total) by mouth 2 (two) times daily with a meal.    Dispense:  180 tablet    Refill:  3   isosorbide mononitrate (IMDUR) 30 MG 24 hr tablet    Sig: Take 1 tablet (30 mg total) by mouth daily.    Dispense:  90 tablet    Refill:  3   lisinopril (ZESTRIL) 40 MG tablet    Sig: Take 1 tablet (40 mg total) by mouth daily.    Dispense:  90 tablet    Refill:  3   atorvastatin (LIPITOR) 40 MG tablet    Sig: Take 1 tablet (40 mg total) by mouth daily.    Dispense:  90 tablet    Refill:  3   Patient Instructions  Medication Instructions:  INCREASE CARVEDILOL TO 12.'5mg'$  TWICE DAILY  PLEASE CALL WHEN YOU GET HOME OR SEND MYCHART MESSAGE WITH YOUR CURRENT MEDICATIONS  *If you need a refill on your cardiac medications before your next appointment, please call your pharmacy*  Lab Work: None Ordered At This Time.  If you have labs (blood work) drawn today and your tests are completely normal, you will receive your results only by: [place] (if you have MyChart) OR A paper copy in  the mail If you have any lab test that is abnormal or we need to change your treatment, we will call you to review the results.  Testing/Procedures: None Ordered At This Time.   Follow-Up: At [facility], you and your health needs are our priority.  As part of our continuing mission to provide you with exceptional heart care, we have created designated Provider Care Teams.  These Care Teams include your primary Cardiologist (physician) and Advanced Practice Providers (APPs -  Physician Assistants and Nurse Practitioners) who all work together to provide you with the care you need, when you need it.  Your next appointment:   3-4 MONTHS  The format for your next appointment:   In Person  Provider:   Elouise Munroe, MD            I,Breanna Adamick,acting as a scribe for Elouise Munroe, MD.,have documented all relevant documentation on the behalf of Elouise Munroe, MD,as directed by  Elouise Munroe, MD while in the presence of Elouise Munroe, MD.  I, Elouise Munroe, MD, have reviewed all documentation for the visit on 10/21/2021. The documentation on today's date of service for the exam, diagnosis, procedures, and orders are all accurate and complete.

## 2021-10-21 NOTE — Patient Instructions (Signed)
Medication Instructions:  INCREASE CARVEDILOL TO 12.'5mg'$  TWICE DAILY  PLEASE CALL WHEN YOU GET HOME OR SEND MYCHART MESSAGE WITH YOUR CURRENT MEDICATIONS  *If you need a refill on your cardiac medications before your next appointment, please call your pharmacy*  Lab Work: None Ordered At This Time.  If you have labs (blood work) drawn today and your tests are completely normal, you will receive your results only by: [place] (if you have MyChart) OR A paper copy in the mail If you have any lab test that is abnormal or we need to change your treatment, we will call you to review the results.  Testing/Procedures: None Ordered At This Time.   Follow-Up: At [facility], you and your health needs are our priority.  As part of our continuing mission to provide you with exceptional heart care, we have created designated Provider Care Teams.  These Care Teams include your primary Cardiologist (physician) and Advanced Practice Providers (APPs -  Physician Assistants and Nurse Practitioners) who all work together to provide you with the care you need, when you need it.  Your next appointment:   3-4 MONTHS  The format for your next appointment:   In Person  Provider:   Elouise Munroe, MD

## 2021-11-05 DIAGNOSIS — R051 Acute cough: Secondary | ICD-10-CM | POA: Diagnosis not present

## 2021-11-05 DIAGNOSIS — J018 Other acute sinusitis: Secondary | ICD-10-CM | POA: Diagnosis not present

## 2021-11-09 ENCOUNTER — Ambulatory Visit
Admission: RE | Admit: 2021-11-09 | Discharge: 2021-11-09 | Disposition: A | Discharge status: Home / Self Care | Payer: BC Managed Care – PPO | Source: Ambulatory Visit | Attending: Otolaryngology | Admitting: Otolaryngology

## 2021-11-09 DIAGNOSIS — J329 Chronic sinusitis, unspecified: Secondary | ICD-10-CM

## 2021-11-09 DIAGNOSIS — K048 Radicular cyst: Secondary | ICD-10-CM | POA: Diagnosis not present

## 2021-11-10 NOTE — Progress Notes (Signed)
[facility] Note  11/24/2021     CHIEF COMPLAINT Patient presents for Retina Follow Up   HISTORY OF PRESENT ILLNESS: Juan [name] is a 61 y.o. male who presents to the clinic today for:   HPI     Retina Follow Up   Patient presents with  Diabetic Retinopathy.  In both eyes.  This started 6 weeks ago.  I, the attending physician,  performed the HPI with the patient and updated documentation appropriately.        Comments   Patient here for 6 weeks retina follow up for NPDR OU. Patient states vision it has been ok. Have been having some double vision and floaters OU. No eye pain.       Last edited by Bernarda Caffey, MD on 11/24/2021  3:29 PM.    Pt states he has had a "little bit of double vision", he has noticed it while driving to work in the morning and while playing golf, he states in the morning his right eye feels "weak", he states it doesn't last long, but happens almost every day  Referring physician: Harlan Stains, MD [place],  [place] [address]  HISTORICAL INFORMATION:   Selected notes from the MEDICAL RECORD NUMBER Referred by Dr. Rutherford Guys for concern of CME s/p cataract sx   CURRENT MEDICATIONS: Current Outpatient Medications (Ophthalmic Drugs)  Medication Sig   dorzolamide-timolol (COSOPT) 22.3-6.8 MG/ML ophthalmic solution Place 1 drop into the right eye 2 (two) times daily. (Patient taking differently: Place 1 drop into both eyes 2 (two) times daily.)   No current facility-administered medications for this visit. (Ophthalmic Drugs)   Current Outpatient Medications (Other)  Medication Sig   ADVAIR DISKUS 500-50 MCG/DOSE AEPB Inhale 1 puff into the lungs 2 (two) times daily.   albuterol (VENTOLIN HFA) 108 (90 Base) MCG/ACT inhaler Inhale 1-2 puffs into the lungs every 6 (six) hours as needed for wheezing or shortness of breath.   alfuzosin (UROXATRAL) 10 MG 24 hr tablet TAKE 1 TABLET BY MOUTH  EVERYDAY AT BEDTIME   aspirin EC 81 MG tablet Take 81 mg by mouth daily.   atorvastatin (LIPITOR) 40 MG tablet Take 1 tablet (40 mg total) by mouth daily.   carvedilol (COREG) 12.5 MG tablet Take 1 tablet (12.5 mg total) by mouth 2 (two) times daily with a meal.   Empagliflozin-metFORMIN HCl (SYNJARDY) 12.06-998 MG TABS Take 1 tablet by mouth 2 (two) times a day.    esomeprazole (NEXIUM) 40 MG capsule Take 40 mg by mouth daily at 12 noon.   famotidine (PEPCID) 40 MG tablet Take by mouth.   insulin glargine, 2 Unit Dial, (TOUJEO MAX SOLOSTAR) 300 UNIT/ML Solostar Pen Inject 40 Units into the skin daily.    iron polysaccharides (NIFEREX) 150 MG capsule Take 150 mg by mouth daily.   isosorbide mononitrate (IMDUR) 30 MG 24 hr tablet Take 1 tablet (30 mg total) by mouth daily.   lisinopril (ZESTRIL) 40 MG tablet Take 1 tablet (40 mg total) by mouth daily.   nitroGLYCERIN (NITROSTAT) 0.4 MG SL tablet Place 0.4 mg under the tongue daily.   No current facility-administered medications for this visit. (Other)   REVIEW OF SYSTEMS: ROS   Positive for: Gastrointestinal, Musculoskeletal, Endocrine, Eyes, Respiratory Negative for: Constitutional, Neurological, Skin, Genitourinary, HENT, Cardiovascular, Psychiatric, Allergic/Imm, Heme/Lymph Last edited by Theodore Demark, COA on 11/24/2021  2:01 PM.     ALLERGIES Allergies  Allergen  Reactions   Naproxen Shortness Of Breath   Naproxen Sodium Shortness Of Breath   Tiotropium Bromide Monohydrate Shortness Of Breath   Penicillins Rash    Has patient had a PCN reaction causing immediate rash, facial/tongue/throat swelling, SOB or lightheadedness with hypotension: Yes Has patient had a PCN reaction causing severe rash involving mucus membranes or skin necrosis: No Has patient had a PCN reaction that required hospitalization No Has patient had a PCN reaction occurring within the last 10 years: No If all of the above answers are "NO", then may proceed  with Cephalosporin use.  Has patient had a PCN reaction causing immediate rash, facial/tongue/throat swelling, SOB or lightheadedness with hypotension: Yes Has patient had a PCN reaction causing severe rash involving mucus membranes or skin necrosis: No Has patient had a PCN reaction that required hospitalization No Has patient had a PCN reaction occurring within the last 10 years: No If all of the above answers are "NO", then may proceed with Cephalosporin use.    PAST MEDICAL HISTORY Past Medical History:  Diagnosis Date   Arthritis    Asthma    COPD (chronic obstructive pulmonary disease) ([place])    Diabetes mellitus    Type 2   Diabetic retinopathy ([place])    NPDR OU   GERD (gastroesophageal reflux disease)    H/O hiatal hernia    History of kidney stones    Hyperlipemia    Hypertension    Hypertensive retinopathy    OU   Nasal polyps    Pneumonia 2014   Shortness of breath    Sleep apnea    pt has CPAP but is unable to use it d/t having polyps in his nose. Pt stated "I need to call and get a CPAP mask instead"   Stomach ulcer    Past Surgical History:  Procedure Laterality Date   CATARACT EXTRACTION Right 07/05/2018   Dr. Gershon Crane   CATARACT EXTRACTION Left 07/12/2018   Dr. Gershon Crane   CHOLECYSTECTOMY N/A 01/23/2015   Procedure: LAPAROSCOPIC CHOLECYSTECTOMY ;  Surgeon: Georganna Skeans, MD;  Location: [facility];  Service: General;  Laterality: N/A;   COLONOSCOPY W/ POLYPECTOMY     ESOPHAGOGASTRODUODENOSCOPY     EYE SURGERY     KNEE ARTHROSCOPY  03/24/2011   Procedure: ARTHROSCOPY KNEE;  Surgeon: Alta Corning, MD;  Location: [facility];  Service: Orthopedics;  Laterality: Right;  partial medial menisectomy, chondroplaty patella, and medial medial plica   LEFT HEART CATH AND CORONARY ANGIOGRAPHY N/A 06/29/2019   Procedure: LEFT HEART CATH AND CORONARY ANGIOGRAPHY;  Surgeon: Wellington Hampshire, MD;  Location: [facility];  Service: Cardiovascular;  Laterality:  N/A;   FAMILY HISTORY Family History  Problem Relation Age of Onset   Diabetes Father    Coronary artery disease Father    SOCIAL HISTORY Social History   Tobacco Use   Smoking status: Former    Packs/day: 3.00    Years: 20.00    Total pack years: 60.00    Types: Cigarettes   Smokeless tobacco: Current    Types: Chew   Tobacco comments:    1 can/daily  Vaping Use   Vaping Use: Never used  Substance Use Topics   Alcohol use: Yes    Comment: social, 1x weekly   Drug use: No       OPHTHALMIC EXAM:  Base Eye Exam     Visual Acuity (Snellen - Linear)       Right Left  Dist [place] 20/25 -2 20/50   Dist ph  20/20 20/40 -1         Tonometry (Tonopen, 1:59 PM)       Right Left   Pressure 20 16         Pupils       Dark Light Shape React APD   Right 3 2 Round Brisk None   Left 3 2 Round Brisk None         Visual Fields (Counting fingers)       Left Right    Full Full         Extraocular Movement       Right Left    Full, Ortho Full, Ortho         Neuro/Psych     Oriented x3: Yes   Mood/Affect: Normal         Dilation     Both eyes: 1.0% Mydriacyl, 2.5% Phenylephrine @ 1:59 PM           Slit Lamp and Fundus Exam     Slit Lamp Exam       Right Left   Lids/Lashes Dermatochalasis - upper lid, Telangiectasia, mild Meibomian gland dysfunction Dermatochalasis - upper lid, Telangiectasia, mild Meibomian gland dysfunction   Conjunctiva/Sclera White and quiet Mild nasal Pinguecula   Cornea Mild Arcus, Well healed temporal cataract wounds Arcus, Well healed temporal cataract wounds, trace PEE   Anterior Chamber Deep and clear Deep and clear   Iris Round and dilated, mild patch of atrophy at 0800, no NVI Round and dilated, No NVI   Lens PC IOL in good position PC IOL in good position   Anterior Vitreous Vitreous syneresis Vitreous syneresis         Fundus Exam       Right Left   Disc trace pallor, Sharp rim Pallor, Sharp rim,  Compact, mild PPP temporal   C/D Ratio 0.3 0.3   Macula Good foveal reflex, scattered IRH/DBH -- greatest temporal macula - improved, trace residual cystic changes remain, mild focal laser changes Blunted foveal reflex, +central atrophy and pigment clumping, scattered DBH and MAs, scattered punctate exudates - greatest superior and temporal macula -- persistent, cystic changes / edema temporal macula - persistent / slightly improved   Vessels attenuated, Tortuous attenuated, Tortuous   Periphery Attached, scattered DBH greatest posteriorly Attached, pigmented choroidal nevus superiorly with mild elevation, +drusen, no SRF or orange pigment -- unchanged from prior           IMAGING AND PROCEDURES  Imaging and Procedures for _0 @  OCT, Retina - OU - Both Eyes       Right Eye Quality was good. Central Foveal Thickness: 245. Progression has been stable. Findings include normal foveal contour, no SRF, intraretinal hyper-reflective material, intraretinal fluid, outer retinal atrophy, vitreomacular adhesion (Persistent cystic changes temporal and superior macula -- just trace cystic changes remain, none centrally).   Left Eye Quality was good. Central Foveal Thickness: 213. Progression has been stable. Findings include normal foveal contour, no SRF, intraretinal hyper-reflective material, intraretinal fluid, outer retinal atrophy (Persistnet IRF/IRHM greatest ST mac -- ?slight improvement, persistent central ORA; Sub-RPE hyper-reflective mass consistent with choroidal nevus--superior to disc, caught on widefield -- stable from prior).   Notes *Images captured and stored on drive  Diagnosis / Impression: +DME OU OD: persistent cystic changes temporal and superior macula -- just trace cystic changes remain, none centrally OS: persistent IRF/IRHM greatest ST mac -- ?slight improvement, persistent central  ORA; Sub-RPE hyper-reflective mass consistent with choroidal nevus--superior to disc,  caught on widefield -- stable from prior  Clinical management:  See below  Abbreviations: NFP - Normal foveal profile. CME - cystoid macular edema. PED - pigment epithelial detachment. IRF - intraretinal fluid. SRF - subretinal fluid. EZ - ellipsoid zone. ERM - epiretinal membrane. ORA - outer retinal atrophy. ORT - outer retinal tubulation. SRHM - subretinal hyper-reflective material      Intravitreal Injection, Pharmacologic Agent - OS - Left Eye       Time Out 11/24/2021. 2:26 PM. Confirmed correct patient, procedure, site, and patient consented.   Anesthesia Topical anesthesia was used. Anesthetic medications included Lidocaine 2%, Proparacaine 0.5%.   Procedure Preparation included 5% betadine to ocular surface, eyelid speculum. A (32g) needle was used.   Injection: 2 mg aflibercept 2 MG/0.05ML   Route: Intravitreal, Site: Left Eye   NDC: [id number], Lot: [id number], Expiration date: 03/04/2023, Waste: 0 mL   Post-op Post injection exam found visual acuity of at least counting fingers. The patient tolerated the procedure well. There were no complications. The patient received written and verbal post procedure care education. Post injection medications were not given.            ASSESSMENT/PLAN:    ICD-10-CM   1. 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 Essential hypertension  I10     4. Hypertensive retinopathy of both eyes  H35.033     5. Pseudophakia of both eyes  Z96.1     6. Ocular hypertension of right eye  H40.051     7. Monocular diplopia of both eyes  H53.2      1. Severe nonproliferative diabetic retinopathy with DME, OU (OS>OD)  - s/p IVA OD #1 (06.26.20), #2 (08.14.20), #3  (09.17.20), #4 (10.16.20), #5 (02.02.22)  - s/p IVA OS #1 (07.17.20), #2 (08.14.20), #3 (09.17.20)  **IVA RESISTANCE OU**  - s/p focal laser OD (07.18.23) - s/p IVE OS #1 (10.16.20) - sample, #2 (11.13.20), #3 (12.11.20), #4 (01.18.21), #5 (02.19.21), #6 (03.19.21), #7 (04.16.21), #8 (05.14.21), #9 (06.17.21), #10 (7.16.21), #11 (09.21.21), #12 (10.29.21), #13 (11.29.21), #14 (12.29.21), #15 (02.02.22, sample), #16 (03.09.22), #17 (04.06.22), #18 (05.11.2012), #19 (06.15.22), #20 (7.20.22), #21 (08.24.22), #22 (10.4.22), #23 (11.8.22), #24 (12.13.22), #25 (01.17.23), #26 (02.21.23), #27 (03.28.23). #28 (05.09.23), #29 (06.20.23), #30 (08.01.23), #31 (09.12.23) - s/p IVE OD #1 (11.13.20), #2 (12.11.20), #3 (01.18.21), #4 (02.19.21), #5 (03.19.21), #6 (04.16.21), #7 (05.14.21), #8 (06.17.21), #9 (07.16.21), #10 (09.21.21), #11 (10.29.21), #12 (11.29.21), #13 (12.29.21), #14 (03.09.22), #15 (04.06.22), #16 (05.11.12), #17 (06.15.22), #18 (7.20.22), #19 (08.24.22), #20 (10.04.22), #21 (11.08.22), #22 (12.13.22), #23 (02.21.23), #24 (03.28.23), #25 (05.09.23), #26 (06.20.23), #27 (08.01.23)  - FA (06.26.20) shows Severe NPDR OU w/ Late leaking MA OU, Enlarged FAZ OU, No NV OU, No significant hyperfluorescence of disc  - BCVA OD 20/20; OS 20/40 -- stable  - OCT shows OD: persistent cystic changes temporal and superior macula -- just trace cystic changes remain, none centrally at 12 wks since last IVE (08.01.23); OS: persistent IRF/IRHM greatest ST mac -- ?slight improvement, persistent central ORA; Sub-RPE hyper-reflective mass consistent with choroidal nevus--superior to disc, caught on widefield -- stable  from prior at 6 wks  - recommend IVE OS #32 today, 10.24.23 w/ f/u in 6 wks  - will hold OD again today   - pt in agreement  - RBA of procedure discussed, questions answered  - informed consent obtained  - see procedure note  - Eylea informed consent form re-signed and scanned on 05.09.23  - [email or web address]  Benefits Investigation initiated 10.16.2020 -- approved for 2023  - f/u 6 weeks, DFE, OCT, possible injections  2. Choroidal Nevus OS  - located at 1200, mild elevation, +drusen, no SRF  - baseline optos pictures obtained, 06.26.20  - discussed possible referral to oncology at [facility] in [place]  3,4. Hypertensive retinopathy OU  - discussed importance of tight BP control  - monitor  5. Pseudophakia OU  - s/p CE/IOL OU (OD on 06.03.20 and OS 06.10.20 by Dr. Gershon Crane)  - beautiful surgeries w/ IOLs in excellent position  - macular edema limiting vision as above  - monitor  6. Ocular hypertension   - IOP 20,16  - cont Cosopt qd OU  - monitor  7. Intermittent diplopia  - pt reports intermittent episodes of diplopia with both vertical and horizontal components; no rotational  - will refer to [facility] for further evaluation and management  Ophthalmic Meds Ordered this visit:  Meds ordered this encounter  Medications   aflibercept (EYLEA) SOLN 2 mg     Return in about 6 weeks (around 01/05/2022) for f/u NPDR OU, DFE, OCT.  There are no Patient Instructions on file for this visit.  This document serves as a record of services personally performed by Gardiner Sleeper, MD, PhD. It was created on their behalf by Renaldo Reel, [place] an ophthalmic technician. The creation of this record is the provider's dictation and/or activities during the visit.    Electronically signed by:  Renaldo Reel, COT  10.10.23 4:08 PM   This document serves as a record of services personally performed by Gardiner Sleeper, MD, PhD. It was created on their behalf by San Jetty. Owens Shark, OA an ophthalmic technician. The creation of this record is the provider's dictation and/or activities during the visit.    Electronically signed by: San Jetty. [place], [place] 10.24.2023 4:08 PM  Gardiner Sleeper, M.D., Ph.D. Diseases & Surgery of the Retina and Vitreous [facility]  I  have reviewed the above documentation for accuracy and completeness, and I agree with the above. Gardiner Sleeper, M.D., Ph.D. 11/24/21 4:08 PM  Abbreviations: M myopia (nearsighted); A astigmatism; H hyperopia (farsighted); P presbyopia; Mrx spectacle prescription;  CTL contact lenses; OD right eye; OS left eye; OU both eyes  XT exotropia; ET esotropia; PEK punctate epithelial keratitis; PEE punctate epithelial erosions; DES dry eye syndrome; MGD meibomian gland dysfunction; ATs artificial tears; PFAT's preservative free artificial tears; [place] nuclear sclerotic cataract; PSC posterior subcapsular cataract; ERM epi-retinal membrane; PVD posterior vitreous detachment; RD retinal detachment; DM diabetes mellitus; DR diabetic retinopathy; NPDR non-proliferative diabetic retinopathy; PDR proliferative diabetic retinopathy; CSME clinically significant macular edema; DME diabetic macular edema; dbh dot blot hemorrhages; CWS cotton wool spot; POAG primary open angle glaucoma; C/D cup-to-disc ratio; HVF humphrey visual field; GVF goldmann visual field; OCT optical coherence tomography; IOP intraocular pressure; BRVO Branch retinal vein occlusion; CRVO central retinal vein occlusion; CRAO central retinal artery occlusion; BRAO branch retinal artery occlusion; RT retinal tear; SB scleral buckle; PPV pars plana vitrectomy; VH Vitreous hemorrhage; PRP panretinal laser photocoagulation; IVK intravitreal  kenalog; VMT vitreomacular traction; MH Macular hole;  NVD neovascularization of the disc; NVE neovascularization elsewhere; AREDS age related eye disease study; ARMD age related macular degeneration; POAG primary open angle glaucoma; EBMD epithelial/anterior basement membrane dystrophy; ACIOL anterior chamber intraocular lens; IOL intraocular lens; PCIOL posterior chamber intraocular lens; Phaco/IOL phacoemulsification with intraocular lens placement; [place] photorefractive keratectomy; LASIK laser assisted in situ keratomileusis;  HTN hypertension; DM diabetes mellitus; COPD chronic obstructive pulmonary disease

## 2021-11-17 DIAGNOSIS — J342 Deviated nasal septum: Secondary | ICD-10-CM | POA: Diagnosis not present

## 2021-11-17 DIAGNOSIS — J343 Hypertrophy of nasal turbinates: Secondary | ICD-10-CM | POA: Diagnosis not present

## 2021-11-17 DIAGNOSIS — J310 Chronic rhinitis: Secondary | ICD-10-CM | POA: Diagnosis not present

## 2021-11-24 ENCOUNTER — Encounter (INDEPENDENT_AMBULATORY_CARE_PROVIDER_SITE_OTHER): Payer: Self-pay | Admitting: Ophthalmology

## 2021-11-24 ENCOUNTER — Ambulatory Visit (INDEPENDENT_AMBULATORY_CARE_PROVIDER_SITE_OTHER): Payer: BC Managed Care – PPO | Admitting: Ophthalmology

## 2021-11-24 DIAGNOSIS — D3132 Benign neoplasm of left choroid: Secondary | ICD-10-CM | POA: Diagnosis not present

## 2021-11-24 DIAGNOSIS — E113413 Type 2 diabetes mellitus with severe nonproliferative diabetic retinopathy with macular edema, bilateral: Secondary | ICD-10-CM

## 2021-11-24 DIAGNOSIS — Z961 Presence of intraocular lens: Secondary | ICD-10-CM

## 2021-11-24 DIAGNOSIS — H35033 Hypertensive retinopathy, bilateral: Secondary | ICD-10-CM | POA: Diagnosis not present

## 2021-11-24 DIAGNOSIS — H40051 Ocular hypertension, right eye: Secondary | ICD-10-CM

## 2021-11-24 DIAGNOSIS — I10 Essential (primary) hypertension: Secondary | ICD-10-CM

## 2021-11-24 DIAGNOSIS — H532 Diplopia: Secondary | ICD-10-CM

## 2021-11-24 MED ORDER — AFLIBERCEPT 2MG/0.05ML IZ SOLN FOR KALEIDOSCOPE
2.0000 mg | INTRAVITREAL | Status: AC | PRN
  Administered 2021-11-24: 2 mg via INTRAVITREAL

## 2021-11-26 DIAGNOSIS — E1159 Type 2 diabetes mellitus with other circulatory complications: Secondary | ICD-10-CM | POA: Diagnosis not present

## 2021-11-26 DIAGNOSIS — E1129 Type 2 diabetes mellitus with other diabetic kidney complication: Secondary | ICD-10-CM | POA: Diagnosis not present

## 2021-11-26 DIAGNOSIS — E1169 Type 2 diabetes mellitus with other specified complication: Secondary | ICD-10-CM | POA: Diagnosis not present

## 2021-11-26 DIAGNOSIS — Z794 Long term (current) use of insulin: Secondary | ICD-10-CM | POA: Diagnosis not present

## 2021-11-26 DIAGNOSIS — E1140 Type 2 diabetes mellitus with diabetic neuropathy, unspecified: Secondary | ICD-10-CM | POA: Diagnosis not present

## 2021-11-26 DIAGNOSIS — E113413 Type 2 diabetes mellitus with severe nonproliferative diabetic retinopathy with macular edema, bilateral: Secondary | ICD-10-CM | POA: Diagnosis not present

## 2021-11-26 DIAGNOSIS — I152 Hypertension secondary to endocrine disorders: Secondary | ICD-10-CM | POA: Diagnosis not present

## 2021-12-10 DIAGNOSIS — Z961 Presence of intraocular lens: Secondary | ICD-10-CM | POA: Diagnosis not present

## 2021-12-10 DIAGNOSIS — H532 Diplopia: Secondary | ICD-10-CM | POA: Diagnosis not present

## 2021-12-10 DIAGNOSIS — D3132 Benign neoplasm of left choroid: Secondary | ICD-10-CM | POA: Diagnosis not present

## 2021-12-10 DIAGNOSIS — E113413 Type 2 diabetes mellitus with severe nonproliferative diabetic retinopathy with macular edema, bilateral: Secondary | ICD-10-CM | POA: Diagnosis not present

## 2021-12-28 DIAGNOSIS — B349 Viral infection, unspecified: Secondary | ICD-10-CM | POA: Diagnosis not present

## 2021-12-28 NOTE — Progress Notes (Signed)
[facility] Note  01/05/2022     CHIEF COMPLAINT Patient presents for Retina Follow Up   HISTORY OF PRESENT ILLNESS: Juan [name] is a 61 y.o. male who presents to the clinic today for:   HPI     Retina Follow Up   Patient presents with  Diabetic Retinopathy.  In both eyes.  This started 6 weeks ago.  Duration of 6.  I, the attending physician,  performed the HPI with the patient and updated documentation appropriately.        Comments   6 week retina eval NPDR OU and I'VE OS pt states no vision changes noticed his last blood sugar was 125 this morning       Last edited by Bernarda Caffey, MD on 01/06/2022  2:08 AM.     Pt had an appt with Dr. Katy Fitch for his diplopia, he states Dr. Katy Fitch is not recommending prism in his glasses at this time, pt states over the past few weeks he has felt like the right eye was not getting light in it  Referring physician: Harlan Stains, MD [place],  [place] [address]  HISTORICAL INFORMATION:   Selected notes from the MEDICAL RECORD NUMBER Referred by Dr. Rutherford Guys for concern of CME s/p cataract sx   CURRENT MEDICATIONS: Current Outpatient Medications (Ophthalmic Drugs)  Medication Sig   dorzolamide-timolol (COSOPT) 22.3-6.8 MG/ML ophthalmic solution Place 1 drop into the right eye 2 (two) times daily. (Patient taking differently: Place 1 drop into both eyes 2 (two) times daily.)   No current facility-administered medications for this visit. (Ophthalmic Drugs)   Current Outpatient Medications (Other)  Medication Sig   ADVAIR DISKUS 500-50 MCG/DOSE AEPB Inhale 1 puff into the lungs 2 (two) times daily.   albuterol (VENTOLIN HFA) 108 (90 Base) MCG/ACT inhaler Inhale 1-2 puffs into the lungs every 6 (six) hours as needed for wheezing or shortness of breath.   alfuzosin (UROXATRAL) 10 MG 24 hr tablet TAKE 1 TABLET BY MOUTH EVERYDAY AT BEDTIME   aspirin EC 81 MG tablet Take 81 mg  by mouth daily.   atorvastatin (LIPITOR) 40 MG tablet Take 1 tablet (40 mg total) by mouth daily.   carvedilol (COREG) 12.5 MG tablet Take 1 tablet (12.5 mg total) by mouth 2 (two) times daily with a meal.   Empagliflozin-metFORMIN HCl (SYNJARDY) 12.06-998 MG TABS Take 1 tablet by mouth 2 (two) times a day.    esomeprazole (NEXIUM) 40 MG capsule Take 40 mg by mouth daily at 12 noon.   famotidine (PEPCID) 40 MG tablet Take by mouth.   insulin glargine, 2 Unit Dial, (TOUJEO MAX SOLOSTAR) 300 UNIT/ML Solostar Pen Inject 40 Units into the skin daily.    iron polysaccharides (NIFEREX) 150 MG capsule Take 150 mg by mouth daily.   isosorbide mononitrate (IMDUR) 30 MG 24 hr tablet Take 1 tablet (30 mg total) by mouth daily.   lisinopril (ZESTRIL) 40 MG tablet Take 1 tablet (40 mg total) by mouth daily.   nitroGLYCERIN (NITROSTAT) 0.4 MG SL tablet Place 0.4 mg under the tongue daily.   No current facility-administered medications for this visit. (Other)   REVIEW OF SYSTEMS:   ALLERGIES Allergies  Allergen Reactions   Naproxen Shortness Of Breath   Naproxen Sodium Shortness Of Breath   Tiotropium Bromide Monohydrate Shortness Of Breath   Penicillins Rash    Has patient had a PCN reaction causing immediate rash, facial/tongue/throat swelling,  SOB or lightheadedness with hypotension: Yes Has patient had a PCN reaction causing severe rash involving mucus membranes or skin necrosis: No Has patient had a PCN reaction that required hospitalization No Has patient had a PCN reaction occurring within the last 10 years: No If all of the above answers are "NO", then may proceed with Cephalosporin use.  Has patient had a PCN reaction causing immediate rash, facial/tongue/throat swelling, SOB or lightheadedness with hypotension: Yes Has patient had a PCN reaction causing severe rash involving mucus membranes or skin necrosis: No Has patient had a PCN reaction that required hospitalization No Has patient  had a PCN reaction occurring within the last 10 years: No If all of the above answers are "NO", then may proceed with Cephalosporin use.    PAST MEDICAL HISTORY Past Medical History:  Diagnosis Date   Arthritis    Asthma    COPD (chronic obstructive pulmonary disease) ([place])    Diabetes mellitus    Type 2   Diabetic retinopathy ([place])    NPDR OU   GERD (gastroesophageal reflux disease)    H/O hiatal hernia    History of kidney stones    Hyperlipemia    Hypertension    Hypertensive retinopathy    OU   Nasal polyps    Pneumonia 2014   Shortness of breath    Sleep apnea    pt has CPAP but is unable to use it d/t having polyps in his nose. Pt stated "I need to call and get a CPAP mask instead"   Stomach ulcer    Past Surgical History:  Procedure Laterality Date   CATARACT EXTRACTION Right 07/05/2018   Dr. Gershon Crane   CATARACT EXTRACTION Left 07/12/2018   Dr. Gershon Crane   CHOLECYSTECTOMY N/A 01/23/2015   Procedure: LAPAROSCOPIC CHOLECYSTECTOMY ;  Surgeon: Georganna Skeans, MD;  Location: [facility];  Service: General;  Laterality: N/A;   COLONOSCOPY W/ POLYPECTOMY     ESOPHAGOGASTRODUODENOSCOPY     EYE SURGERY     KNEE ARTHROSCOPY  03/24/2011   Procedure: ARTHROSCOPY KNEE;  Surgeon: Alta Corning, MD;  Location: [facility];  Service: Orthopedics;  Laterality: Right;  partial medial menisectomy, chondroplaty patella, and medial medial plica   LEFT HEART CATH AND CORONARY ANGIOGRAPHY N/A 06/29/2019   Procedure: LEFT HEART CATH AND CORONARY ANGIOGRAPHY;  Surgeon: Wellington Hampshire, MD;  Location: [facility];  Service: Cardiovascular;  Laterality: N/A;   FAMILY HISTORY Family History  Problem Relation Age of Onset   Diabetes Father    Coronary artery disease Father    SOCIAL HISTORY Social History   Tobacco Use   Smoking status: Former    Packs/day: 3.00    Years: 20.00    Total pack years: 60.00    Types: Cigarettes   Smokeless tobacco: Current    Types:  Chew   Tobacco comments:    1 can/daily  Vaping Use   Vaping Use: Never used  Substance Use Topics   Alcohol use: Yes    Comment: social, 1x weekly   Drug use: No       OPHTHALMIC EXAM:  Base Eye Exam     Visual Acuity (Snellen - Linear)       Right Left   Dist [place] 20/30 -2 20/50 -1   Dist ph [place] 20/25 -2 20/40 -2         Tonometry (Tonopen, 2:20 PM)       Right Left   Pressure 13 16  Pupils       Pupils Dark Light Shape React APD   Right PERRL 3 2 Round Brisk None   Left PERRL 3 2 Round Brisk None         Visual Fields       Left Right    Full Full         Extraocular Movement       Right Left    Full, Ortho Full, Ortho         Neuro/Psych     Oriented x3: Yes   Mood/Affect: Normal         Dilation     Both eyes: 2.5% Phenylephrine @ 2:20 PM           Slit Lamp and Fundus Exam     Slit Lamp Exam       Right Left   Lids/Lashes Dermatochalasis - upper lid, Telangiectasia, mild Meibomian gland dysfunction Dermatochalasis - upper lid, Telangiectasia, mild Meibomian gland dysfunction   Conjunctiva/Sclera White and quiet Mild nasal Pinguecula   Cornea Mild Arcus, Well healed temporal cataract wounds Arcus, Well healed temporal cataract wounds, trace PEE   Anterior Chamber Deep and clear Deep and clear   Iris Round and dilated, mild patch of atrophy at 0800, no NVI Round and dilated, No NVI   Lens PC IOL in good position PC IOL in good position   Anterior Vitreous Vitreous syneresis Vitreous syneresis         Fundus Exam       Right Left   Disc trace pallor, Sharp rim Pallor, Sharp rim, Compact, mild PPP temporal   C/D Ratio 0.3 0.3   Macula Good foveal reflex, scattered IRH/DBH -- greatest temporal macula - improved, trace residual cystic changes remain, mild focal laser changes Blunted foveal reflex, +central atrophy and pigment clumping, scattered DBH and MAs, scattered punctate exudates - greatest superior and temporal  macula -- persistent, cystic changes / edema temporal macula - persistent / slightly improved   Vessels attenuated, Tortuous attenuated, Tortuous   Periphery Attached, scattered DBH greatest posteriorly Attached, pigmented choroidal nevus superiorly with mild elevation, +drusen, no SRF or orange pigment -- unchanged from prior           IMAGING AND PROCEDURES  Imaging and Procedures for _0 @  OCT, Retina - OU - Both Eyes       Right Eye Quality was good. Central Foveal Thickness: 246. Progression has been stable. Findings include normal foveal contour, no SRF, intraretinal hyper-reflective material, intraretinal fluid, outer retinal atrophy, vitreomacular adhesion (Persistent cystic changes temporal and superior macula -- just trace cystic changes remain, none centrally).   Left Eye Quality was good. Central Foveal Thickness: 203. Progression has been stable. Findings include normal foveal contour, no SRF, intraretinal hyper-reflective material, intraretinal fluid, outer retinal atrophy (Mild interval improvement in IRF/IRHM greatest ST mac, persistent central ORA; Sub-RPE hyper-reflective mass consistent with choroidal nevus--superior to disc, caught on widefield -- stable from prior).   Notes *Images captured and stored on drive  Diagnosis / Impression: +DME OU OD: persistent cystic changes temporal and superior macula -- just trace cystic changes remain, none centrally OS: Mild interval improvement in IRF/IRHM greatest ST mac, persistent central ORA; Sub-RPE hyper-reflective mass consistent with choroidal nevus--superior to disc, caught on widefield -- stable from prior  Clinical management:  See below  Abbreviations: NFP - Normal foveal profile. CME - cystoid macular edema. PED - pigment epithelial detachment. IRF - intraretinal fluid. SRF - subretinal  fluid. EZ - ellipsoid zone. ERM - epiretinal membrane. ORA - outer retinal atrophy. ORT - outer retinal tubulation. SRHM -  subretinal hyper-reflective material      Intravitreal Injection, Pharmacologic Agent - OS - Left Eye       Time Out 01/05/2022. 3:13 PM. Confirmed correct patient, procedure, site, and patient consented.   Anesthesia Topical anesthesia was used. Anesthetic medications included Lidocaine 2%, Proparacaine 0.5%.   Procedure Preparation included 5% betadine to ocular surface, eyelid speculum. A (32g) needle was used.   Injection: 2 mg aflibercept 2 MG/0.05ML   Route: Intravitreal, Site: Left Eye   NDC: A3590391, Lot: [id number], Expiration date: 04/01/2023, Waste: 0 mL   Post-op Post injection exam found visual acuity of at least counting fingers. The patient tolerated the procedure well. There were no complications. The patient received written and verbal post procedure care education. Post injection medications were not given.            ASSESSMENT/PLAN:    ICD-10-CM   1. 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 Essential hypertension  I10     4. Hypertensive retinopathy of both eyes  H35.033     5. Pseudophakia of both eyes  Z96.1     6. Ocular hypertension of right eye  H40.051     7. Monocular diplopia of both eyes  H53.2      1. Severe nonproliferative diabetic retinopathy with DME, OU (OS>OD)  - s/p IVA OD #1 (06.26.20), #2 (08.14.20), #3 (09.17.20), #4 (10.16.20), #5 (02.02.22)  - s/p IVA OS #1 (07.17.20), #2 (08.14.20), #3 (09.17.20)  **IVA RESISTANCE OU**  - s/p focal laser OD (07.18.23) - s/p IVE OD #1 (11.13.20), #2 (12.11.20), #3 (01.18.21), #4 (02.19.21), #5 (03.19.21), #6 (04.16.21), #7 (05.14.21), #8 (06.17.21), #9 (07.16.21), #10 (09.21.21), #11 (10.29.21), #12 (11.29.21), #13  (12.29.21), #14 (03.09.22), #15 (04.06.22), #16 (05.11.12), #17 (06.15.22), #18 (7.20.22), #19 (08.24.22), #20 (10.04.22), #21 (11.08.22), #22 (12.13.22), #23 (02.21.23), #24 (03.28.23), #25 (05.09.23), #26 (06.20.23), #27 (08.01.23) - s/p IVE OS #1 (10.16.20) - sample, #2 (11.13.20), #3 (12.11.20), #4 (01.18.21), #5 (02.19.21), #6 (03.19.21), #7 (04.16.21), #8 (05.14.21), #9 (06.17.21), #10 (7.16.21), #11 (09.21.21), #12 (10.29.21), #13 (11.29.21), #14 (12.29.21), #15 (02.02.22, sample), #16 (03.09.22), #17 (04.06.22), #18 (05.11.2012), #19 (06.15.22), #20 (7.20.22), #21 (08.24.22), #22 (10.4.22), #23 (11.8.22), #24 (12.13.22), #25 (01.17.23), #26 (02.21.23), #27 (03.28.23). #28 (05.09.23), #29 (06.20.23), #30 (08.01.23), #31 (09.12.23), #32 (10.24.23)  - FA (06.26.20) shows Severe NPDR OU w/ Late leaking MA OU, Enlarged FAZ OU, No NV OU, No significant hyperfluorescence of disc  - BCVA OD 20/20; OS 20/40 -- stable  - OCT shows OD: persistent cystic changes temporal and superior macula -- just trace cystic changes remain, none centrally at 4 mos since last IVE (08.01.23); OS: persistent IRF/IRHM greatest ST mac -- ?slight improvement, persistent central ORA; Sub-RPE hyper-reflective mass consistent with choroidal nevus--superior to disc, caught on widefield -- stable from prior at 6 wks  - recommend IVE OS #33 today, 12.05.23 w/ f/u in 6 wks  - will hold OD again today   - pt in agreement  - RBA of procedure discussed, questions answered  - informed consent obtained  -  see procedure note  - Eylea informed consent form re-signed and scanned on 05.09.23  - [email or web address] Benefits Investigation initiated 10.16.2020 -- approved for 2023  - will check [employer] auth for next visit  - f/u 6 weeks, DFE, OCT, possible injections  2. Choroidal Nevus OS  - located at 1200, mild elevation, +drusen, no SRF  - baseline optos pictures obtained, 06.26.20  - discussed possible referral to oncology at [facility] in  [place]  3,4. Hypertensive retinopathy OU  - discussed importance of tight BP control  - monitor  5. Pseudophakia OU  - s/p CE/IOL OU (OD on 06.03.20 and OS 06.10.20 by Dr. Gershon Crane)  - beautiful surgeries w/ IOLs in excellent position  - macular edema limiting vision as above  - monitor  6. Ocular hypertension   - IOP 13,16  - cont Cosopt qd OU  - monitor  7. Intermittent diplopia  - pt reports intermittent episodes of diplopia with both vertical and horizontal components; no rotational  - pt had appt with Dr. Clent Jacks on 11.9.23 -- noted some improvement with prism but did not recommend getting the prism at this time    Ophthalmic Meds Ordered this visit:  Meds ordered this encounter  Medications   aflibercept (EYLEA) SOLN 2 mg     Return in about 6 weeks (around 02/16/2022) for DME OU, Dilated Exam, OCT, Possible Injxn.  There are no Patient Instructions on file for this visit.  This document serves as a record of services personally performed by Gardiner Sleeper, MD, PhD. It was created on their behalf by Renaldo Reel, [place] an ophthalmic technician. The creation of this record is the provider's dictation and/or activities during the visit.    Electronically signed by:  Renaldo Reel, COT  11.27.23 2:08 AM   This document serves as a record of services personally performed by Gardiner Sleeper, MD, PhD. It was created on their behalf by San Jetty. Owens Shark, OA an ophthalmic technician. The creation of this record is the provider's dictation and/or activities during the visit.    Electronically signed by: San Jetty. Owens Shark, [place] 12.05.2023 2:08 AM  Gardiner Sleeper, M.D., Ph.D. Diseases & Surgery of the Retina and Vitreous [facility]  I have reviewed the above documentation for accuracy and completeness, and I agree with the above. Gardiner Sleeper, M.D., Ph.D. 01/06/22 2:11 AM   Abbreviations: M myopia (nearsighted); A astigmatism; H hyperopia  (farsighted); P presbyopia; Mrx spectacle prescription;  CTL contact lenses; OD right eye; OS left eye; OU both eyes  XT exotropia; ET esotropia; PEK punctate epithelial keratitis; PEE punctate epithelial erosions; DES dry eye syndrome; MGD meibomian gland dysfunction; ATs artificial tears; PFAT's preservative free artificial tears; [place] nuclear sclerotic cataract; PSC posterior subcapsular cataract; ERM epi-retinal membrane; PVD posterior vitreous detachment; RD retinal detachment; DM diabetes mellitus; DR diabetic retinopathy; NPDR non-proliferative diabetic retinopathy; PDR proliferative diabetic retinopathy; CSME clinically significant macular edema; DME diabetic macular edema; dbh dot blot hemorrhages; CWS cotton wool spot; POAG primary open angle glaucoma; C/D cup-to-disc ratio; HVF humphrey visual field; GVF goldmann visual field; OCT optical coherence tomography; IOP intraocular pressure; BRVO Branch retinal vein occlusion; CRVO central retinal vein occlusion; CRAO central retinal artery occlusion; BRAO branch retinal artery occlusion; RT retinal tear; SB scleral buckle; PPV pars plana vitrectomy; VH Vitreous hemorrhage; PRP panretinal laser photocoagulation; IVK intravitreal kenalog; VMT vitreomacular traction; MH Macular hole;  NVD neovascularization of the disc; NVE neovascularization elsewhere; AREDS age  related eye disease study; ARMD age related macular degeneration; POAG primary open angle glaucoma; EBMD epithelial/anterior basement membrane dystrophy; ACIOL anterior chamber intraocular lens; IOL intraocular lens; PCIOL posterior chamber intraocular lens; Phaco/IOL phacoemulsification with intraocular lens placement; [place] photorefractive keratectomy; LASIK laser assisted in situ keratomileusis; HTN hypertension; DM diabetes mellitus; COPD chronic obstructive pulmonary disease

## 2022-01-05 ENCOUNTER — Ambulatory Visit (INDEPENDENT_AMBULATORY_CARE_PROVIDER_SITE_OTHER): Payer: BC Managed Care – PPO | Admitting: Ophthalmology

## 2022-01-05 ENCOUNTER — Encounter (INDEPENDENT_AMBULATORY_CARE_PROVIDER_SITE_OTHER): Payer: Self-pay | Admitting: Ophthalmology

## 2022-01-05 DIAGNOSIS — H35033 Hypertensive retinopathy, bilateral: Secondary | ICD-10-CM

## 2022-01-05 DIAGNOSIS — E113413 Type 2 diabetes mellitus with severe nonproliferative diabetic retinopathy with macular edema, bilateral: Secondary | ICD-10-CM | POA: Diagnosis not present

## 2022-01-05 DIAGNOSIS — H532 Diplopia: Secondary | ICD-10-CM

## 2022-01-05 DIAGNOSIS — I10 Essential (primary) hypertension: Secondary | ICD-10-CM | POA: Diagnosis not present

## 2022-01-05 DIAGNOSIS — D3132 Benign neoplasm of left choroid: Secondary | ICD-10-CM | POA: Diagnosis not present

## 2022-01-05 DIAGNOSIS — Z961 Presence of intraocular lens: Secondary | ICD-10-CM

## 2022-01-05 DIAGNOSIS — H40051 Ocular hypertension, right eye: Secondary | ICD-10-CM

## 2022-01-05 MED ORDER — AFLIBERCEPT 2MG/0.05ML IZ SOLN FOR KALEIDOSCOPE
2.0000 mg | INTRAVITREAL | Status: AC | PRN
  Administered 2022-01-05: 2 mg via INTRAVITREAL

## 2022-01-06 DIAGNOSIS — E1169 Type 2 diabetes mellitus with other specified complication: Secondary | ICD-10-CM | POA: Diagnosis not present

## 2022-01-06 DIAGNOSIS — J441 Chronic obstructive pulmonary disease with (acute) exacerbation: Secondary | ICD-10-CM | POA: Diagnosis not present

## 2022-01-28 DIAGNOSIS — K644 Residual hemorrhoidal skin tags: Secondary | ICD-10-CM | POA: Diagnosis not present

## 2022-02-12 DIAGNOSIS — I10 Essential (primary) hypertension: Secondary | ICD-10-CM | POA: Diagnosis not present

## 2022-02-12 DIAGNOSIS — Z0000 Encounter for general adult medical examination without abnormal findings: Secondary | ICD-10-CM | POA: Diagnosis not present

## 2022-02-12 DIAGNOSIS — E113413 Type 2 diabetes mellitus with severe nonproliferative diabetic retinopathy with macular edema, bilateral: Secondary | ICD-10-CM | POA: Diagnosis not present

## 2022-02-12 DIAGNOSIS — Z13220 Encounter for screening for lipoid disorders: Secondary | ICD-10-CM | POA: Diagnosis not present

## 2022-02-12 DIAGNOSIS — E785 Hyperlipidemia, unspecified: Secondary | ICD-10-CM | POA: Diagnosis not present

## 2022-02-12 DIAGNOSIS — Z23 Encounter for immunization: Secondary | ICD-10-CM | POA: Diagnosis not present

## 2022-02-12 DIAGNOSIS — D509 Iron deficiency anemia, unspecified: Secondary | ICD-10-CM | POA: Diagnosis not present

## 2022-02-12 DIAGNOSIS — J449 Chronic obstructive pulmonary disease, unspecified: Secondary | ICD-10-CM | POA: Diagnosis not present

## 2022-02-12 DIAGNOSIS — Z125 Encounter for screening for malignant neoplasm of prostate: Secondary | ICD-10-CM | POA: Diagnosis not present

## 2022-02-16 ENCOUNTER — Encounter (INDEPENDENT_AMBULATORY_CARE_PROVIDER_SITE_OTHER): Payer: Self-pay | Admitting: Ophthalmology

## 2022-02-16 ENCOUNTER — Ambulatory Visit (INDEPENDENT_AMBULATORY_CARE_PROVIDER_SITE_OTHER): Payer: BC Managed Care – PPO | Admitting: Ophthalmology

## 2022-02-16 DIAGNOSIS — D3132 Benign neoplasm of left choroid: Secondary | ICD-10-CM | POA: Diagnosis not present

## 2022-02-16 DIAGNOSIS — E113413 Type 2 diabetes mellitus with severe nonproliferative diabetic retinopathy with macular edema, bilateral: Secondary | ICD-10-CM

## 2022-02-16 DIAGNOSIS — H35033 Hypertensive retinopathy, bilateral: Secondary | ICD-10-CM | POA: Diagnosis not present

## 2022-02-16 DIAGNOSIS — H532 Diplopia: Secondary | ICD-10-CM

## 2022-02-16 DIAGNOSIS — I10 Essential (primary) hypertension: Secondary | ICD-10-CM | POA: Diagnosis not present

## 2022-02-16 DIAGNOSIS — Z961 Presence of intraocular lens: Secondary | ICD-10-CM

## 2022-02-16 DIAGNOSIS — H40051 Ocular hypertension, right eye: Secondary | ICD-10-CM

## 2022-02-16 MED ORDER — FARICIMAB-SVOA 6 MG/0.05ML IZ SOLN
6.0000 mg | INTRAVITREAL | Status: AC | PRN
  Administered 2022-02-16: 6 mg via INTRAVITREAL

## 2022-02-16 NOTE — Progress Notes (Signed)
[facility] Note  02/16/2022     CHIEF COMPLAINT Patient presents for Retina Follow Up   HISTORY OF PRESENT ILLNESS: Juan [name] is a 62 y.o. male who presents to the clinic today for:   HPI     Retina Follow Up   Patient presents with  Diabetic Retinopathy.  In both eyes.  This started 6 weeks ago.  I, the attending physician,  performed the HPI with the patient and updated documentation appropriately.        Comments   Patient here for 6 weeks retina follow up for NPDR OU. Patient states vision is fair. No eye pain. Not using gtts.      Last edited by Bernarda Caffey, MD on 02/16/2022 10:20 PM.      Referring physician: Harlan Stains, MD [place],  [place] [address]  HISTORICAL INFORMATION:   Selected notes from the MEDICAL RECORD NUMBER Referred by Dr. Rutherford Guys for concern of CME s/p cataract sx   CURRENT MEDICATIONS: Current Outpatient Medications (Ophthalmic Drugs)  Medication Sig   dorzolamide-timolol (COSOPT) 22.3-6.8 MG/ML ophthalmic solution Place 1 drop into the right eye 2 (two) times daily. (Patient taking differently: Place 1 drop into both eyes 2 (two) times daily.)   No current facility-administered medications for this visit. (Ophthalmic Drugs)   Current Outpatient Medications (Other)  Medication Sig   ADVAIR DISKUS 500-50 MCG/DOSE AEPB Inhale 1 puff into the lungs 2 (two) times daily.   albuterol (VENTOLIN HFA) 108 (90 Base) MCG/ACT inhaler Inhale 1-2 puffs into the lungs every 6 (six) hours as needed for wheezing or shortness of breath.   alfuzosin (UROXATRAL) 10 MG 24 hr tablet TAKE 1 TABLET BY MOUTH EVERYDAY AT BEDTIME   aspirin EC 81 MG tablet Take 81 mg by mouth daily.   atorvastatin (LIPITOR) 40 MG tablet Take 1 tablet (40 mg total) by mouth daily.   carvedilol (COREG) 12.5 MG tablet Take 1 tablet (12.5 mg total) by mouth 2 (two) times daily with a meal.   Empagliflozin-metFORMIN  HCl (SYNJARDY) 12.06-998 MG TABS Take 1 tablet by mouth 2 (two) times a day.    esomeprazole (NEXIUM) 40 MG capsule Take 40 mg by mouth daily at 12 noon.   famotidine (PEPCID) 40 MG tablet Take by mouth.   insulin glargine, 2 Unit Dial, (TOUJEO MAX SOLOSTAR) 300 UNIT/ML Solostar Pen Inject 40 Units into the skin daily.    iron polysaccharides (NIFEREX) 150 MG capsule Take 150 mg by mouth daily.   isosorbide mononitrate (IMDUR) 30 MG 24 hr tablet Take 1 tablet (30 mg total) by mouth daily.   lisinopril (ZESTRIL) 40 MG tablet Take 1 tablet (40 mg total) by mouth daily.   nitroGLYCERIN (NITROSTAT) 0.4 MG SL tablet Place 0.4 mg under the tongue daily.   No current facility-administered medications for this visit. (Other)   REVIEW OF SYSTEMS: ROS   Positive for: Gastrointestinal, Musculoskeletal, Endocrine, Eyes, Respiratory Negative for: Constitutional, Neurological, Skin, Genitourinary, HENT, Cardiovascular, Psychiatric, Allergic/Imm, Heme/Lymph Last edited by Theodore Demark, COA on 02/16/2022  2:38 PM.      ALLERGIES Allergies  Allergen Reactions   Naproxen Shortness Of Breath   Naproxen Sodium Shortness Of Breath   Tiotropium Bromide Monohydrate Shortness Of Breath   Penicillins Rash    Has patient had a PCN reaction causing immediate rash, facial/tongue/throat swelling, SOB or lightheadedness with hypotension: Yes Has patient had a PCN reaction causing severe rash  involving mucus membranes or skin necrosis: No Has patient had a PCN reaction that required hospitalization No Has patient had a PCN reaction occurring within the last 10 years: No If all of the above answers are "NO", then may proceed with Cephalosporin use.  Has patient had a PCN reaction causing immediate rash, facial/tongue/throat swelling, SOB or lightheadedness with hypotension: Yes Has patient had a PCN reaction causing severe rash involving mucus membranes or skin necrosis: No Has patient had a PCN reaction that  required hospitalization No Has patient had a PCN reaction occurring within the last 10 years: No If all of the above answers are "NO", then may proceed with Cephalosporin use.    PAST MEDICAL HISTORY Past Medical History:  Diagnosis Date   Arthritis    Asthma    COPD (chronic obstructive pulmonary disease) ([place])    Diabetes mellitus    Type 2   Diabetic retinopathy ([place])    NPDR OU   GERD (gastroesophageal reflux disease)    H/O hiatal hernia    History of kidney stones    Hyperlipemia    Hypertension    Hypertensive retinopathy    OU   Nasal polyps    Pneumonia 2014   Shortness of breath    Sleep apnea    pt has CPAP but is unable to use it d/t having polyps in his nose. Pt stated "I need to call and get a CPAP mask instead"   Stomach ulcer    Past Surgical History:  Procedure Laterality Date   CATARACT EXTRACTION Right 07/05/2018   Dr. Gershon Crane   CATARACT EXTRACTION Left 07/12/2018   Dr. Gershon Crane   CHOLECYSTECTOMY N/A 01/23/2015   Procedure: LAPAROSCOPIC CHOLECYSTECTOMY ;  Surgeon: Georganna Skeans, MD;  Location: [facility];  Service: General;  Laterality: N/A;   COLONOSCOPY W/ POLYPECTOMY     ESOPHAGOGASTRODUODENOSCOPY     EYE SURGERY     KNEE ARTHROSCOPY  03/24/2011   Procedure: ARTHROSCOPY KNEE;  Surgeon: Alta Corning, MD;  Location: [facility];  Service: Orthopedics;  Laterality: Right;  partial medial menisectomy, chondroplaty patella, and medial medial plica   LEFT HEART CATH AND CORONARY ANGIOGRAPHY N/A 06/29/2019   Procedure: LEFT HEART CATH AND CORONARY ANGIOGRAPHY;  Surgeon: Wellington Hampshire, MD;  Location: [facility];  Service: Cardiovascular;  Laterality: N/A;   FAMILY HISTORY Family History  Problem Relation Age of Onset   Diabetes Father    Coronary artery disease Father    SOCIAL HISTORY Social History   Tobacco Use   Smoking status: Former    Packs/day: 3.00    Years: 20.00    Total pack years: 60.00    Types: Cigarettes    Smokeless tobacco: Current    Types: Chew   Tobacco comments:    1 can/daily  Vaping Use   Vaping Use: Never used  Substance Use Topics   Alcohol use: Yes    Comment: social, 1x weekly   Drug use: No       OPHTHALMIC EXAM:  Base Eye Exam     Visual Acuity (Snellen - Linear)       Right Left   Dist [place] 20/30 -1 20/50 -1   Dist ph [place] 20/25 +2 20/40 -2         Tonometry (Tonopen, 2:36 PM)       Right Left   Pressure 17 19         Pupils  Dark Light Shape React APD   Right 3 2 Round Brisk None   Left 3 2 Round Brisk None         Visual Fields (Counting fingers)       Left Right    Full Full         Extraocular Movement       Right Left    Full, Ortho Full, Ortho         Neuro/Psych     Oriented x3: Yes   Mood/Affect: Normal         Dilation     Both eyes: 1.0% Mydriacyl, 2.5% Phenylephrine @ 2:36 PM           Slit Lamp and Fundus Exam     Slit Lamp Exam       Right Left   Lids/Lashes Dermatochalasis - upper lid, Telangiectasia, mild Meibomian gland dysfunction Dermatochalasis - upper lid, Telangiectasia, mild Meibomian gland dysfunction   Conjunctiva/Sclera White and quiet Mild nasal Pinguecula   Cornea Mild Arcus, Well healed temporal cataract wounds Arcus, Well healed temporal cataract wounds, trace PEE   Anterior Chamber Deep and clear Deep and clear   Iris Round and dilated, mild patch of atrophy at 0800, no NVI Round and dilated, No NVI   Lens PC IOL in good position PC IOL in good position   Anterior Vitreous Vitreous syneresis Vitreous syneresis         Fundus Exam       Right Left   Disc trace pallor, Sharp rim Pallor, Sharp rim, Compact, mild PPP temporal   C/D Ratio 0.3 0.3   Macula Good foveal reflex, scattered IRH/DBH -- greatest temporal macula - improved, trace residual cystic changes temporal mac, mild focal laser changes Blunted foveal reflex, +central atrophy and pigment clumping, scattered DBH and MAs,  scattered punctate exudates - greatest superior and temporal macula -- persistent, cystic changes / edema temporal macula - persistent / slightly increased   Vessels attenuated, Tortuous attenuated, Tortuous   Periphery Attached, scattered DBH greatest posteriorly Attached, pigmented choroidal nevus superiorly with mild elevation, +drusen, no SRF or orange pigment -- unchanged from prior           IMAGING AND PROCEDURES  Imaging and Procedures for _0 @  OCT, Retina - OU - Both Eyes       Right Eye Quality was good. Central Foveal Thickness: 255. Progression has been stable. Findings include normal foveal contour, no SRF, intraretinal hyper-reflective material, intraretinal fluid, outer retinal atrophy, vitreomacular adhesion (Trace persistent cystic changes temporal and superior macula ).   Left Eye Quality was good. Central Foveal Thickness: 200. Progression has worsened. Findings include normal foveal contour, no SRF, intraretinal hyper-reflective material, intraretinal fluid, outer retinal atrophy (persistent IRF/IRHM greatest ST mac -- ?slightly increased, persistent central ORA; Sub-RPE hyper-reflective mass consistent with choroidal nevus -- superior to disc, caught on widefield -- stable from prior).   Notes *Images captured and stored on drive  Diagnosis / Impression: +DME OU OD: trace persistent cystic changes temporal and superior macula  OS: persistent IRF/IRHM greatest ST mac -- ?slightly increased, persistent central ORA; Sub-RPE hyper-reflective mass consistent with choroidal nevus -- superior to disc, caught on widefield -- stable from prior  Clinical management:  See below  Abbreviations: NFP - Normal foveal profile. CME - cystoid macular edema. PED - pigment epithelial detachment. IRF - intraretinal fluid. SRF - subretinal fluid. EZ - ellipsoid zone. ERM - epiretinal membrane. ORA - outer retinal atrophy.  ORT - outer retinal tubulation. SRHM - subretinal  hyper-reflective material      Intravitreal Injection, Pharmacologic Agent - OS - Left Eye       Time Out 02/16/2022. 3:52 PM. Confirmed correct patient, procedure, site, and patient consented.   Anesthesia Topical anesthesia was used. Anesthetic medications included Lidocaine 2%, Proparacaine 0.5%.   Procedure Preparation included 5% betadine to ocular surface, eyelid speculum. A (32g) needle was used.   Injection: 6 mg faricimab-svoa 6 MG/0.05ML   Route: Intravitreal, Site: Left Eye   NDC: [id number], Lot: [id number], Expiration date: 02/01/2024, Waste: 0 mL   Post-op Post injection exam found visual acuity of at least counting fingers. The patient tolerated the procedure well. There were no complications. The patient received written and verbal post procedure care education. Post injection medications were not given.            ASSESSMENT/PLAN:    ICD-10-CM   1. Severe nonproliferative diabetic retinopathy of both eyes with macular edema associated with type 2 diabetes mellitus (HCC)  E11.3413 OCT, Retina - OU - Both Eyes    Intravitreal Injection, Pharmacologic Agent - OS - Left Eye    faricimab-svoa (VABYSMO) 64m/0.05mL intravitreal injection    2. 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 Essential hypertension  I10     4. Hypertensive retinopathy of both eyes  H35.033     5. Pseudophakia of both eyes  Z96.1     6. Ocular hypertension of right eye  H40.051     7. Monocular diplopia of both eyes  H53.2      1. Severe nonproliferative diabetic retinopathy with DME, OU (OS>OD)  - s/p IVA OD #1 (06.26.20), #2 (08.14.20), #3 (09.17.20), #4 (10.16.20), #5 (02.02.22)  - s/p IVA OS #1 (07.17.20), #2 (08.14.20), #3 (09.17.20)  **IVA RESISTANCE OU**  - s/p focal laser OD (07.18.23) - s/p IVE OD #1 (11.13.20), #2 (12.11.20), #3 (01.18.21), #4 (02.19.21), #5 (03.19.21), #6 (04.16.21), #7 (05.14.21), #8 (06.17.21), #9 (07.16.21), #10 (09.21.21), #11 (10.29.21), #12  (11.29.21), #13 (12.29.21), #14 (03.09.22), #15 (04.06.22), #16 (05.11.12), #17 (06.15.22), #18 (7.20.22), #19 (08.24.22), #20 (10.04.22), #21 (11.08.22), #22 (12.13.22), #23 (02.21.23), #24 (03.28.23), #25 (05.09.23), #26 (06.20.23), #27 (08.01.23) - s/p IVE OS #1 (10.16.20) - sample, #2 (11.13.20), #3 (12.11.20), #4 (01.18.21), #5 (02.19.21), #6 (03.19.21), #7 (04.16.21), #8 (05.14.21), #9 (06.17.21), #10 (7.16.21), #11 (09.21.21), #12 (10.29.21), #13 (11.29.21), #14 (12.29.21), #15 (02.02.22, sample), #16 (03.09.22), #17 (04.06.22), #18 (05.11.2012), #19 (06.15.22), #20 (7.20.22), #21 (08.24.22), #22 (10.4.22), #23 (11.8.22), #24 (12.13.22), #25 (01.17.23), #26 (02.21.23), #27 (03.28.23). #28 (05.09.23), #29 (06.20.23), #30 (08.01.23), #31 (09.12.23), #32 (10.24.23), #33 (12.05.23)  - FA (06.26.20) shows Severe NPDR OU w/ Late leaking MA OU, Enlarged FAZ OU, No NV OU, No significant hyperfluorescence of disc  - BCVA OD 20/25; OS 20/40 -- stable  - OCT shows OD: trace persistent cystic changes temporal and superior macula; OS: persistent IRF/IRHM greatest ST mac -- ?slightly increased, persistent central ORA; Sub-RPE hyper-reflective mass consistent with choroidal nevus -- superior to disc, caught on widefield -- stable from prior  - discussed IVE resistance and possible benefit of switching medication  - recommend switching to IVV OS #1 today, 1.16.24 w/ f/u back to 5 wks  - will hold OD again today   - pt in agreement  - RBA of procedure discussed, questions answered  - informed consent obtained  - see procedure note  - [facility] informed consent form re-signed and scanned  on 05.09.23  - Vabysmo informed consent form signed and scanned, 01.16.24 (OU)  - [email or web address] Benefits Investigation initiated 10.16.2020 -- approved for 2024  - Vabysmo auth approved for 2024  - f/u 6 weeks, DFE, OCT, possible injections  2. Choroidal Nevus OS  - located at 1200, mild elevation, +drusen, no SRF  - baseline optos  pictures obtained, 06.26.20  - discussed possible referral to oncology at [facility] in [place]  3,4. Hypertensive retinopathy OU  - discussed importance of tight BP control  - monitor  5. Pseudophakia OU  - s/p CE/IOL OU (OD on 06.03.20 and OS 06.10.20 by Dr. Gershon Crane)  - beautiful surgeries w/ IOLs in excellent position  - macular edema limiting vision as above  - monitor  6. Ocular hypertension   - IOP 13,16  - cont Cosopt qd OU  - monitor  7. Intermittent diplopia  - pt reports intermittent episodes of diplopia with both vertical and horizontal components; no rotational  - pt had appt with Dr. Clent Jacks on 11.9.23 -- noted some improvement with prism but did not recommend getting the prism at this time   Ophthalmic Meds Ordered this visit:  Meds ordered this encounter  Medications   faricimab-svoa (VABYSMO) 18m/0.05mL intravitreal injection     Return in about 5 weeks (around 03/23/2022) for f/u NPDR OU, DFE, OCT.  There are no Patient Instructions on file for this visit.  This document serves as a record of services personally performed by BGardiner Sleeper MD, PhD. It was created on their behalf by CRenaldo Reel CIrondalean ophthalmic technician. The creation of this record is the provider's dictation and/or activities during the visit.    Electronically signed by:  CRenaldo Reel COT  1.15.24 10:21 PM   BGardiner Sleeper M.D., Ph.D. Diseases & Surgery of the Retina and Vitreous [place] I have reviewed the above documentation for accuracy and completeness, and I agree with the above. BGardiner Sleeper M.D., Ph.D. 02/16/22 10:24 PM  Abbreviations: M myopia (nearsighted); A astigmatism; H hyperopia (farsighted); P presbyopia; Mrx spectacle prescription;  CTL contact lenses; OD right eye; OS left eye; OU both eyes  XT exotropia; ET esotropia; PEK punctate epithelial keratitis; PEE punctate epithelial erosions; DES dry eye syndrome; MGD  meibomian gland dysfunction; ATs artificial tears; PFAT's preservative free artificial tears; NBerlinnuclear sclerotic cataract; PSC posterior subcapsular cataract; ERM epi-retinal membrane; PVD posterior vitreous detachment; RD retinal detachment; DM diabetes mellitus; DR diabetic retinopathy; NPDR non-proliferative diabetic retinopathy; PDR proliferative diabetic retinopathy; CSME clinically significant macular edema; DME diabetic macular edema; dbh dot blot hemorrhages; CWS cotton wool spot; POAG primary open angle glaucoma; C/D cup-to-disc ratio; HVF humphrey visual field; GVF goldmann visual field; OCT optical coherence tomography; IOP intraocular pressure; BRVO Branch retinal vein occlusion; CRVO central retinal vein occlusion; CRAO central retinal artery occlusion; BRAO branch retinal artery occlusion; RT retinal tear; SB scleral buckle; PPV pars plana vitrectomy; VH Vitreous hemorrhage; PRP panretinal laser photocoagulation; IVK intravitreal kenalog; VMT vitreomacular traction; MH Macular hole;  NVD neovascularization of the disc; NVE neovascularization elsewhere; AREDS age related eye disease study; ARMD age related macular degeneration; POAG primary open angle glaucoma; EBMD epithelial/anterior basement membrane dystrophy; ACIOL anterior chamber intraocular lens; IOL intraocular lens; PCIOL posterior chamber intraocular lens; Phaco/IOL phacoemulsification with intraocular lens placement; PLaconiaphotorefractive keratectomy; LASIK laser assisted in situ keratomileusis; HTN hypertension; DM diabetes mellitus; COPD chronic obstructive pulmonary disease

## 2022-03-01 ENCOUNTER — Ambulatory Visit: Payer: BC Managed Care – PPO | Admitting: Internal Medicine

## 2022-03-05 DIAGNOSIS — J209 Acute bronchitis, unspecified: Secondary | ICD-10-CM | POA: Diagnosis not present

## 2022-03-05 DIAGNOSIS — J4540 Moderate persistent asthma, uncomplicated: Secondary | ICD-10-CM | POA: Diagnosis not present

## 2022-03-09 NOTE — Progress Notes (Signed)
[facility] Note  03/23/2022     CHIEF COMPLAINT Patient presents for Retina Follow Up   HISTORY OF PRESENT ILLNESS: Juan [name] is a 62 y.o. male who presents to the clinic today for:   HPI     Retina Follow Up   Patient presents with  Diabetic Retinopathy.  In both eyes.  This started 5 weeks ago.  I, the attending physician,  performed the HPI with the patient and updated documentation appropriately.        Comments   Patient here for 5 weeks retina follow up for NPDR OU. Patient states vision about the same. No eye pain.       Last edited by Bernarda Caffey, MD on 03/23/2022  3:49 PM.      Referring physician: Harlan Stains, MD [place],  [place] [address]  HISTORICAL INFORMATION:   Selected notes from the MEDICAL RECORD NUMBER Referred by Dr. Rutherford Guys for concern of CME s/p cataract sx   CURRENT MEDICATIONS: Current Outpatient Medications (Ophthalmic Drugs)  Medication Sig   dorzolamide-timolol (COSOPT) 22.3-6.8 MG/ML ophthalmic solution Place 1 drop into the right eye 2 (two) times daily. (Patient taking differently: Place 1 drop into both eyes 2 (two) times daily.)   No current facility-administered medications for this visit. (Ophthalmic Drugs)   Current Outpatient Medications (Other)  Medication Sig   ADVAIR DISKUS 500-50 MCG/DOSE AEPB Inhale 1 puff into the lungs 2 (two) times daily.   albuterol (VENTOLIN HFA) 108 (90 Base) MCG/ACT inhaler Inhale 1-2 puffs into the lungs every 6 (six) hours as needed for wheezing or shortness of breath.   alfuzosin (UROXATRAL) 10 MG 24 hr tablet TAKE 1 TABLET BY MOUTH EVERYDAY AT BEDTIME   aspirin EC 81 MG tablet Take 81 mg by mouth daily.   atorvastatin (LIPITOR) 40 MG tablet Take 1 tablet (40 mg total) by mouth daily.   carvedilol (COREG) 12.5 MG tablet Take 1 tablet (12.5 mg total) by mouth 2 (two) times daily with a meal.   Empagliflozin-metFORMIN HCl  (SYNJARDY) 12.06-998 MG TABS Take 1 tablet by mouth 2 (two) times a day.    esomeprazole (NEXIUM) 40 MG capsule Take 40 mg by mouth daily at 12 noon.   famotidine (PEPCID) 40 MG tablet Take by mouth.   insulin glargine, 2 Unit Dial, (TOUJEO MAX SOLOSTAR) 300 UNIT/ML Solostar Pen Inject 40 Units into the skin daily.    iron polysaccharides (NIFEREX) 150 MG capsule Take 150 mg by mouth daily.   isosorbide mononitrate (IMDUR) 30 MG 24 hr tablet Take 1 tablet (30 mg total) by mouth daily.   lisinopril (ZESTRIL) 40 MG tablet Take 1 tablet (40 mg total) by mouth daily.   nitroGLYCERIN (NITROSTAT) 0.4 MG SL tablet Place 0.4 mg under the tongue daily.   No current facility-administered medications for this visit. (Other)   REVIEW OF SYSTEMS: ROS   Positive for: Gastrointestinal, Musculoskeletal, Endocrine, Eyes, Respiratory Negative for: Constitutional, Neurological, Skin, Genitourinary, HENT, Cardiovascular, Psychiatric, Allergic/Imm, Heme/Lymph Last edited by Theodore Demark, COA on 03/23/2022  3:14 PM.     ALLERGIES Allergies  Allergen Reactions   Naproxen Shortness Of Breath   Naproxen Sodium Shortness Of Breath   Tiotropium Bromide Monohydrate Shortness Of Breath   Penicillins Rash    Has patient had a PCN reaction causing immediate rash, facial/tongue/throat swelling, SOB or lightheadedness with hypotension: Yes Has patient had a PCN reaction causing severe rash involving  mucus membranes or skin necrosis: No Has patient had a PCN reaction that required hospitalization No Has patient had a PCN reaction occurring within the last 10 years: No If all of the above answers are "NO", then may proceed with Cephalosporin use.  Has patient had a PCN reaction causing immediate rash, facial/tongue/throat swelling, SOB or lightheadedness with hypotension: Yes Has patient had a PCN reaction causing severe rash involving mucus membranes or skin necrosis: No Has patient had a PCN reaction that  required hospitalization No Has patient had a PCN reaction occurring within the last 10 years: No If all of the above answers are "NO", then may proceed with Cephalosporin use.    PAST MEDICAL HISTORY Past Medical History:  Diagnosis Date   Arthritis    Asthma    COPD (chronic obstructive pulmonary disease) ([place])    Diabetes mellitus    Type 2   Diabetic retinopathy ([place])    NPDR OU   GERD (gastroesophageal reflux disease)    H/O hiatal hernia    History of kidney stones    Hyperlipemia    Hypertension    Hypertensive retinopathy    OU   Nasal polyps    Pneumonia 2014   Shortness of breath    Sleep apnea    pt has CPAP but is unable to use it d/t having polyps in his nose. Pt stated "I need to call and get a CPAP mask instead"   Stomach ulcer    Past Surgical History:  Procedure Laterality Date   CATARACT EXTRACTION Right 07/05/2018   Dr. Gershon Crane   CATARACT EXTRACTION Left 07/12/2018   Dr. Gershon Crane   CHOLECYSTECTOMY N/A 01/23/2015   Procedure: LAPAROSCOPIC CHOLECYSTECTOMY ;  Surgeon: Georganna Skeans, MD;  Location: [facility];  Service: General;  Laterality: N/A;   COLONOSCOPY W/ POLYPECTOMY     ESOPHAGOGASTRODUODENOSCOPY     EYE SURGERY     KNEE ARTHROSCOPY  03/24/2011   Procedure: ARTHROSCOPY KNEE;  Surgeon: Alta Corning, MD;  Location: [facility];  Service: Orthopedics;  Laterality: Right;  partial medial menisectomy, chondroplaty patella, and medial medial plica   LEFT HEART CATH AND CORONARY ANGIOGRAPHY N/A 06/29/2019   Procedure: LEFT HEART CATH AND CORONARY ANGIOGRAPHY;  Surgeon: Wellington Hampshire, MD;  Location: [facility];  Service: Cardiovascular;  Laterality: N/A;   FAMILY HISTORY Family History  Problem Relation Age of Onset   Diabetes Father    Coronary artery disease Father    SOCIAL HISTORY Social History   Tobacco Use   Smoking status: Former    Packs/day: 3.00    Years: 20.00    Total pack years: 60.00    Types: Cigarettes    Smokeless tobacco: Current    Types: Chew   Tobacco comments:    1 can/daily  Vaping Use   Vaping Use: Never used  Substance Use Topics   Alcohol use: Yes    Comment: social, 1x weekly   Drug use: No       OPHTHALMIC EXAM:  Base Eye Exam     Visual Acuity (Snellen - Linear)       Right Left   Dist [employer] 20/25 -1 20/50 -1   Dist ph [place] 20/25 +1 20/40 -1         Tonometry (Tonopen, 3:12 PM)       Right Left   Pressure 17 18         Pupils       Dark  Light Shape React APD   Right 3 2 Round Brisk None   Left 3 2 Round Brisk None         Visual Fields (Counting fingers)       Left Right    Full Full         Extraocular Movement       Right Left    Full, Ortho Full, Ortho         Neuro/Psych     Oriented x3: Yes   Mood/Affect: Normal         Dilation     Both eyes: 1.0% Mydriacyl, 2.5% Phenylephrine @ 3:12 PM           Slit Lamp and Fundus Exam     Slit Lamp Exam       Right Left   Lids/Lashes Dermatochalasis - upper lid, Telangiectasia, mild Meibomian gland dysfunction Dermatochalasis - upper lid, Telangiectasia, mild Meibomian gland dysfunction   Conjunctiva/Sclera White and quiet Mild nasal Pinguecula   Cornea Mild Arcus, Well healed temporal cataract wounds Arcus, Well healed temporal cataract wounds, trace PEE   Anterior Chamber Deep and clear Deep and clear   Iris Round and dilated, mild patch of atrophy at 0800, no NVI Round and dilated, No NVI   Lens PC IOL in good position PC IOL in good position   Anterior Vitreous Vitreous syneresis Vitreous syneresis         Fundus Exam       Right Left   Disc trace pallor, Sharp rim Pallor, Sharp rim, Compact, mild PPP temporal   C/D Ratio 0.3 0.3   Macula Good foveal reflex, scattered IRH/DBH -- greatest temporal macula -- improved, trace residual cystic changes temporal mac, mild focal laser changes Blunted foveal reflex, +central atrophy and pigment clumping, scattered DBH and MAs,  scattered punctate exudates - greatest superior and temporal macula -- persistent, cystic changes / edema temporal macula - persistent / slightly improved   Vessels attenuated, Tortuous attenuated, Tortuous   Periphery Attached, scattered DBH greatest posteriorly Attached, pigmented choroidal nevus superiorly with mild elevation, +drusen, no SRF or orange pigment -- unchanged from prior           IMAGING AND PROCEDURES  Imaging and Procedures for @TODAY$ @  OCT, Retina - OU - Both Eyes       Right Eye Quality was good. Central Foveal Thickness: 257. Progression has been stable. Findings include normal foveal contour, no SRF, intraretinal hyper-reflective material, intraretinal fluid, outer retinal atrophy, vitreomacular adhesion (Trace persistent cystic changes temporal and superior macula ).   Left Eye Quality was good. Central Foveal Thickness: 202. Progression has improved. Findings include normal foveal contour, no SRF, intraretinal hyper-reflective material, intraretinal fluid, outer retinal atrophy (persistent IRF/IRHM greatest ST mac -- slightly improved, persistent central ORA; Sub-RPE hyper-reflective mass consistent with choroidal nevus -- superior to disc, caught on widefield -- stable from prior).   Notes *Images captured and stored on drive  Diagnosis / Impression: +DME OU OD: trace persistent cystic changes temporal and superior macula  OS: persistent IRF/IRHM greatest ST mac -- slightly improved, persistent central ORA; Sub-RPE hyper-reflective mass consistent with choroidal nevus -- superior to disc, caught on widefield -- stable from prior  Clinical management:  See below  Abbreviations: NFP - Normal foveal profile. CME - cystoid macular edema. PED - pigment epithelial detachment. IRF - intraretinal fluid. SRF - subretinal fluid. EZ - ellipsoid zone. ERM - epiretinal membrane. ORA - outer retinal atrophy. ORT -  outer retinal tubulation. SRHM - subretinal  hyper-reflective material      Intravitreal Injection, Pharmacologic Agent - OS - Left Eye       Time Out 03/23/2022. 3:30 PM. Confirmed correct patient, procedure, site, and patient consented.   Anesthesia Topical anesthesia was used. Anesthetic medications included Lidocaine 2%, Proparacaine 0.5%.   Procedure Preparation included 5% betadine to ocular surface, eyelid speculum. A (32g) needle was used.   Injection: 6 mg faricimab-svoa 6 MG/0.05ML   Route: Intravitreal, Site: Left Eye   NDC: [id number], Lot: [id number], Expiration date: 03/02/2024, Waste: 0 mL   Post-op Post injection exam found visual acuity of at least counting fingers. The patient tolerated the procedure well. There were no complications. The patient received written and verbal post procedure care education. Post injection medications were not given.            ASSESSMENT/PLAN:    ICD-10-CM   1. Severe nonproliferative diabetic retinopathy of both eyes with macular edema associated with type 2 diabetes mellitus (HCC)  E11.3413 OCT, Retina - OU - Both Eyes    Intravitreal Injection, Pharmacologic Agent - OS - Left Eye    faricimab-svoa (VABYSMO) 53m/0.05mL intravitreal injection    2. 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 Essential hypertension  I10     4. Hypertensive retinopathy of both eyes  H35.033     5. Pseudophakia of both eyes  Z96.1     6. Ocular hypertension of right eye  H40.051     7. Monocular diplopia of both eyes  H53.2       1. Severe nonproliferative diabetic retinopathy with DME, OU (OS>OD)  - s/p IVA OD #1 (06.26.20), #2 (08.14.20), #3 (09.17.20), #4 (10.16.20), #5 (02.02.22)  - s/p IVA OS #1 (07.17.20), #2 (08.14.20), #3 (09.17.20)  **IVA RESISTANCE OU**  - s/p focal laser OD (07.18.23) - s/p IVE OD #1 (11.13.20), #2 (12.11.20), #3 (01.18.21), #4 (02.19.21), #5 (03.19.21), #6 (04.16.21), #7 (05.14.21), #8 (06.17.21), #9 (07.16.21), #10 (09.21.21), #11 (10.29.21), #12  (11.29.21), #13 (12.29.21), #14 (03.09.22), #15 (04.06.22), #16 (05.11.12), #17 (06.15.22), #18 (7.20.22), #19 (08.24.22), #20 (10.04.22), #21 (11.08.22), #22 (12.13.22), #23 (02.21.23), #24 (03.28.23), #25 (05.09.23), #26 (06.20.23), #27 (08.01.23) - s/p IVE OS #1 (10.16.20) - sample, #2 (11.13.20), #3 (12.11.20), #4 (01.18.21), #5 (02.19.21), #6 (03.19.21), #7 (04.16.21), #8 (05.14.21), #9 (06.17.21), #10 (7.16.21), #11 (09.21.21), #12 (10.29.21), #13 (11.29.21), #14 (12.29.21), #15 (02.02.22, sample), #16 (03.09.22), #17 (04.06.22), #18 (05.11.2012), #19 (06.15.22), #20 (7.20.22), #21 (08.24.22), #22 (10.4.22), #23 (11.8.22), #24 (12.13.22), #25 (01.17.23), #26 (02.21.23), #27 (03.28.23). #28 (05.09.23), #29 (06.20.23), #30 (08.01.23), #31 (09.12.23), #32 (10.24.23), #33 (12.05.23) -- IVE resistance - s/p IVV OS #1 (01.16.24)  - FA (06.26.20) shows Severe NPDR OU w/ Late leaking MA OU, Enlarged FAZ OU, No NV OU, No significant hyperfluorescence of disc  - BCVA OD 20/25; OS 20/40 -- stable  - OCT shows OD: trace persistent cystic changes temporal and superior macula; OS: persistent IRF/IRHM greatest ST mac -- slightly improved, persistent central ORA; Sub-RPE hyper-reflective mass consistent with choroidal nevus -- superior to disc, caught on widefield -- stable from prior at 5 weeks  - recommend IVV OS #2 today, 02.20.24 w/ f/u at 5 wks  - will hold OD again today   - pt in agreement  - RBA of procedure discussed, questions answered  - informed consent obtained  - see procedure note  - [facility] informed consent form re-signed and scanned on 05.09.23  -  V[facility]ys[employer] informed consent form signed and scanned, 01.16.24 (OU)  - [email or web address] Benefits Investigation initiated 10.16.2020 -- approved for 2024  - V[employer]ys[employer] auth approved for 2024  - f/u 5 weeks, DFE, OCT, possible injections  2. Choroidal Nevus OS  - located at 1200, mild elevation, +drusen, no SRF  - baseline optos pictures obtained, 06.26.20  -  discussed possible referral to oncology at [facility] in [place]  3,4. Hypertensive retinopathy OU  - discussed importance of tight BP control  - monitor  5. Pseudophakia OU  - s/p CE/IOL OU (OD on 06.03.20 and OS 06.10.20 by Dr. Gershon Crane)  - beautiful surgeries w/ IOLs in excellent position  - macular edema limiting vision as above  - monitor  6. Ocular hypertension   - IOP 13,16  - cont Cosopt qd OU  - monitor  7. Intermittent diplopia  - pt reports intermittent episodes of diplopia with both vertical and horizontal components; no rotational  - pt had appt with Dr. Clent Jacks on 11.9.23 -- noted some improvement with prism but did not recommend getting the prism at this time   Ophthalmic Meds Ordered this visit:  Meds ordered this encounter  Medications   faricimab-svoa (VABYSMO) 63m/0.05mL intravitreal injection     Return in about 5 weeks (around 04/27/2022) for f/u NPDR OU, DFE, OCT.  There are no Patient Instructions on file for this visit.  This document serves as a record of services personally performed by BGardiner Sleeper MD, PhD. It was created on their behalf by CRenaldo Reel CLudlow Fallsan ophthalmic technician. The creation of this record is the provider's dictation and/or activities during the visit.    Electronically signed by:  CRenaldo Reel COT  02.06.24 3:52 PM   This document serves as a record of services personally performed by BGardiner Sleeper MD, PhD. It was created on their behalf by ASan Jetty BOwens Shark OA an ophthalmic technician. The creation of this record is the provider's dictation and/or activities during the visit.    Electronically signed by: ASan Jetty BGoldsby ONew York02.2024 3:52 PM   BGardiner Sleeper M.D., Ph.D. Diseases & Surgery of the Retina and Vitreous [facility] I have reviewed the above documentation for accuracy and completeness, and I agree with the above. BGardiner Sleeper M.D., Ph.D. 03/23/22 3:53  PM   Abbreviations: M myopia (nearsighted); A astigmatism; H hyperopia (farsighted); P presbyopia; Mrx spectacle prescription;  CTL contact lenses; OD right eye; OS left eye; OU both eyes  XT exotropia; ET esotropia; PEK punctate epithelial keratitis; PEE punctate epithelial erosions; DES dry eye syndrome; MGD meibomian gland dysfunction; ATs artificial tears; PFAT's preservative free artificial tears; NTimber Covenuclear sclerotic cataract; PSC posterior subcapsular cataract; ERM epi-retinal membrane; PVD posterior vitreous detachment; RD retinal detachment; DM diabetes mellitus; DR diabetic retinopathy; NPDR non-proliferative diabetic retinopathy; PDR proliferative diabetic retinopathy; CSME clinically significant macular edema; DME diabetic macular edema; dbh dot blot hemorrhages; CWS cotton wool spot; POAG primary open angle glaucoma; C/D cup-to-disc ratio; HVF humphrey visual field; GVF goldmann visual field; OCT optical coherence tomography; IOP intraocular pressure; BRVO Branch retinal vein occlusion; CRVO central retinal vein occlusion; CRAO central retinal artery occlusion; BRAO branch retinal artery occlusion; RT retinal tear; SB scleral buckle; PPV pars plana vitrectomy; VH Vitreous hemorrhage; PRP panretinal laser photocoagulation; IVK intravitreal kenalog; VMT vitreomacular traction; MH Macular hole;  NVD neovascularization of the disc; NVE neovascularization elsewhere; AREDS age related eye disease study; ARMD age related macular degeneration;  POAG primary open angle glaucoma; EBMD epithelial/anterior basement membrane dystrophy; ACIOL anterior chamber intraocular lens; IOL intraocular lens; PCIOL posterior chamber intraocular lens; Phaco/IOL phacoemulsification with intraocular lens placement; Campbell photorefractive keratectomy; LASIK laser assisted in situ keratomileusis; HTN hypertension; DM diabetes mellitus; COPD chronic obstructive pulmonary disease

## 2022-03-23 ENCOUNTER — Ambulatory Visit (INDEPENDENT_AMBULATORY_CARE_PROVIDER_SITE_OTHER): Payer: BC Managed Care – PPO | Admitting: Ophthalmology

## 2022-03-23 ENCOUNTER — Encounter (INDEPENDENT_AMBULATORY_CARE_PROVIDER_SITE_OTHER): Payer: Self-pay | Admitting: Ophthalmology

## 2022-03-23 DIAGNOSIS — H35033 Hypertensive retinopathy, bilateral: Secondary | ICD-10-CM

## 2022-03-23 DIAGNOSIS — I10 Essential (primary) hypertension: Secondary | ICD-10-CM

## 2022-03-23 DIAGNOSIS — H532 Diplopia: Secondary | ICD-10-CM

## 2022-03-23 DIAGNOSIS — Z961 Presence of intraocular lens: Secondary | ICD-10-CM

## 2022-03-23 DIAGNOSIS — D3132 Benign neoplasm of left choroid: Secondary | ICD-10-CM | POA: Diagnosis not present

## 2022-03-23 DIAGNOSIS — E113413 Type 2 diabetes mellitus with severe nonproliferative diabetic retinopathy with macular edema, bilateral: Secondary | ICD-10-CM

## 2022-03-23 DIAGNOSIS — H40051 Ocular hypertension, right eye: Secondary | ICD-10-CM

## 2022-03-23 MED ORDER — FARICIMAB-SVOA 6 MG/0.05ML IZ SOLN
6.0000 mg | INTRAVITREAL | Status: AC | PRN
  Administered 2022-03-23: 6 mg via INTRAVITREAL

## 2022-04-02 ENCOUNTER — Encounter: Payer: Self-pay | Admitting: Internal Medicine

## 2022-04-02 ENCOUNTER — Ambulatory Visit: Payer: BC Managed Care – PPO | Attending: Internal Medicine | Admitting: Internal Medicine

## 2022-04-02 VITALS — BP 130/78 | HR 81 | Ht 69.0 in | Wt 234.4 lb

## 2022-04-02 DIAGNOSIS — G4733 Obstructive sleep apnea (adult) (pediatric): Secondary | ICD-10-CM

## 2022-04-02 DIAGNOSIS — I493 Ventricular premature depolarization: Secondary | ICD-10-CM

## 2022-04-02 DIAGNOSIS — I10 Essential (primary) hypertension: Secondary | ICD-10-CM | POA: Diagnosis not present

## 2022-04-02 DIAGNOSIS — E785 Hyperlipidemia, unspecified: Secondary | ICD-10-CM | POA: Diagnosis not present

## 2022-04-02 DIAGNOSIS — E118 Type 2 diabetes mellitus with unspecified complications: Secondary | ICD-10-CM

## 2022-04-02 DIAGNOSIS — I2510 Atherosclerotic heart disease of native coronary artery without angina pectoris: Secondary | ICD-10-CM | POA: Diagnosis not present

## 2022-04-02 DIAGNOSIS — Z794 Long term (current) use of insulin: Secondary | ICD-10-CM

## 2022-04-02 NOTE — Progress Notes (Signed)
Cardiology Office Note:    Date:  04/02/2022  ID:  Juan [name], DOB 31-Aug-1960, MRN [id number]  PCP:  Laurann Montana, MD  Cardiologist:  Parke Poisson, MD  Electrophysiologist:  None   Referring MD: Laurann Montana, MD   Chief Complaint: CAD  History of Present Illness:    Juan [name] is a 62 y.o. male with a history of CAD, aortic atherosclerosis, hypertension, hyperlipidemia, OSA, type 2 diabetes, COPD, and GERD here for follow up. Additional history of moderate-severely calcified coronary artery disease on LHC performed for abnormal stress test, with distal circumflex disease recommended for medical management given discrepancy between area of ischemia and lesion location. This was discussed by phone with the patient after his cath and we started carvedilol for medical therapy of CAD.    04/02/22: Doing well overall. Minimal palpitations. Tolerating medical therapy. The patient denies chest pain, chest pressure, dyspnea at rest or with exertion, PND, orthopnea, or leg swelling. Denies cough, fever, chills. Denies nausea, vomiting. Denies syncope or presyncope. Denies dizziness or lightheadedness.   Prior visits: He presented to the ED 02/06/2021 for complaints of chest pain, bilateral LE edema, and shortness of breath. EKG was unremarkable, repeat echo revealed EF 55 to 60%, no LVH, no RWMA, normal diastolic function, mild TR.   He was last seen by Bernadene Person, NP, on 06/23/2021. He reported occasional fleeting palpitations. His EKG performed that day showed frequent PVCs. He endorsed that a trigger could be the significant amount of [facility] he drinks. He declined suggestion of increasing carvedilol. He denied any additional symptoms or concerns.  At his last appointment, he denied significant chest pain but did note continued SOB related to his known history of COPD. He is established with pulmonary medicine. Medical therapy for CAD includes: ASA 81 mg daily, atorvastatin  40 mg daily, carvedilol 3.125 mg BID. We added nitro to his regimen as needed for chest pain.  10/21/21: he says he has been doing well. He mentions needing a refill of a medication he was started on at his last hospital visit. He ran out of one of his medications but is not sure which one. His medication list was discussed at length. He called in after the visit ended and shared it was Imdur.   He states that this morning he woke up laying in bed and felt his heart start to race. He reports that this feeling resolves fairly quickly, but he has sometimes been experiencing this a couple of times a day.  Additionally, he mentions experiencing some mild bilateral LE edema at times, more prominent in his left leg. He endorses prior left knee and ankle injuries.    Of note, he mentions experiencing some chest pains, but believes it is more due to heartburn than anything else.  He states that one of his last blood pressure readings at a clinic visit was around 130/70s.   He has not been wearing his CPAP. He has learned to sleep on his right side, which improves his breathing. He says that he does not tend to move around much in his sleep.   He has been trying to start some exercise. He has started light and has been having no chest pain. He drives a forklift for work and can perform pushups and pull-ups on the machine. He also walks and plays golf for exercise. He states that he can become short of breath occasionally with exercise, but it does not limit him.   He is  still consuming [facility] fairly regularly. He reports that he tends to drink about 8 bottles of water each day.  He used to smoke 2 ppd for 30 years. He quit around 20 years ago.   Past Medical History:  Diagnosis Date   Arthritis    Asthma    COPD (chronic obstructive pulmonary disease) (HCC)    Diabetes mellitus    Type 2   Diabetic retinopathy (HCC)    NPDR OU   GERD (gastroesophageal reflux disease)    H/O hiatal hernia     History of kidney stones    Hyperlipemia    Hypertension    Hypertensive retinopathy    OU   Nasal polyps    Pneumonia 2014   Shortness of breath    Sleep apnea    pt has CPAP but is unable to use it d/t having polyps in his nose. Pt stated "I need to call and get a CPAP mask instead"   Stomach ulcer     Past Surgical History:  Procedure Laterality Date   CATARACT EXTRACTION Right 07/05/2018   Dr. Nile Riggs   CATARACT EXTRACTION Left 07/12/2018   Dr. Nile Riggs   CHOLECYSTECTOMY N/A 01/23/2015   Procedure: LAPAROSCOPIC CHOLECYSTECTOMY ;  Surgeon: Violeta Gelinas, MD;  Location: [facility] OR;  Service: General;  Laterality: N/A;   COLONOSCOPY W/ POLYPECTOMY     ESOPHAGOGASTRODUODENOSCOPY     EYE SURGERY     KNEE ARTHROSCOPY  03/24/2011   Procedure: ARTHROSCOPY KNEE;  Surgeon: Harvie Junior, MD;  Location: [facility];  Service: Orthopedics;  Laterality: Right;  partial medial menisectomy, chondroplaty patella, and medial medial plica   LEFT HEART CATH AND CORONARY ANGIOGRAPHY N/A 06/29/2019   Procedure: LEFT HEART CATH AND CORONARY ANGIOGRAPHY;  Surgeon: Iran Ouch, MD;  Location: [facility] INVASIVE [facility];  Service: Cardiovascular;  Laterality: N/A;    Current Medications: Current Meds  Medication Sig   ADVAIR DISKUS 500-50 MCG/DOSE AEPB Inhale 1 puff into the lungs 2 (two) times daily.   albuterol (VENTOLIN HFA) 108 (90 Base) MCG/ACT inhaler Inhale 1-2 puffs into the lungs every 6 (six) hours as needed for wheezing or shortness of breath.   alfuzosin (UROXATRAL) 10 MG 24 hr tablet TAKE 1 TABLET BY MOUTH EVERYDAY AT BEDTIME   aspirin EC 81 MG tablet Take 81 mg by mouth daily.   atorvastatin (LIPITOR) 40 MG tablet Take 1 tablet (40 mg total) by mouth daily.   carvedilol (COREG) 12.5 MG tablet Take 1 tablet (12.5 mg total) by mouth 2 (two) times daily with a meal.   dorzolamide-timolol (COSOPT) 22.3-6.8 MG/ML ophthalmic solution Place 1 drop into the right eye 2 (two) times  daily. (Patient taking differently: Place 1 drop into both eyes 2 (two) times daily.)   Empagliflozin-metFORMIN HCl (SYNJARDY) 12.06-998 MG TABS Take 1 tablet by mouth 2 (two) times a day.    esomeprazole (NEXIUM) 40 MG capsule Take 40 mg by mouth daily at 12 noon.   famotidine (PEPCID) 40 MG tablet Take by mouth.   insulin glargine, 2 Unit Dial, (TOUJEO MAX SOLOSTAR) 300 UNIT/ML Solostar Pen Inject 40 Units into the skin daily.    iron polysaccharides (NIFEREX) 150 MG capsule Take 150 mg by mouth daily.   isosorbide mononitrate (IMDUR) 30 MG 24 hr tablet Take 1 tablet (30 mg total) by mouth daily.   lisinopril (ZESTRIL) 40 MG tablet Take 1 tablet (40 mg total) by mouth daily.   nitroGLYCERIN (NITROSTAT) 0.4 MG SL  tablet Place 0.4 mg under the tongue daily.   OZEMPIC, 0.25 OR 0.5 MG/DOSE, 2 MG/3ML SOPN Inject 0.25 mg into the skin once a week.     Allergies:   Naproxen, Naproxen sodium, Tiotropium bromide monohydrate, and Penicillins   Social History   Socioeconomic History   Marital status: Single    Spouse name: Not on file   Number of children: Not on file   Years of education: Not on file   Highest education level: Not on file  Occupational History   Not on file  Tobacco Use   Smoking status: Former    Packs/day: 3.00    Years: 20.00    Additional pack years: 0.00    Total pack years: 60.00    Types: Cigarettes   Smokeless tobacco: Current    Types: Chew   Tobacco comments:    1 can/daily  Vaping Use   Vaping Use: Never used  Substance and Sexual Activity   Alcohol use: Yes    Comment: social, 1x weekly   Drug use: No   Sexual activity: Never  Other Topics Concern   Not on file  Social History Narrative   Not on file   Social Determinants of Health   Financial Resource Strain: Not on file  Food Insecurity: Not on file  Transportation Needs: Not on file  Physical Activity: Not on file  Stress: Not on file  Social Connections: Not on file     Family  History: The patient's family history includes Coronary artery disease in his father; Diabetes in his father.  ROS:   Please see the history of present illness.     All other systems reviewed and are negative.  EKGs/Labs/Other Studies Reviewed:    The following studies were reviewed today:  Echo 02/07/2021 [facility]): Left Ventricle: Doppler parameters indicate normal diastolic function.    Left Ventricle: Wall motion is normal.    Left Ventricle: Systolic function is normal. EF: 55-60%.    Aortic Valve: There is no regurgitation.    Aortic Valve: The aortic valve is tricuspid. The leaflets exhibit  normal excursion.    Mitral Valve: There is no mitral regurgitation.    Tricuspid Valve: The right ventricular systolic pressure is normal (<36  mmHg).    Tricuspid Valve: There is mild regurgitation.    Pericardium: There is no pericardial effusion.  Left Heart Cath 06/29/2019: The left ventricular systolic function is normal. LV end diastolic pressure is mildly elevated. The left ventricular ejection fraction is 55-65% by visual estimate. Dist RCA lesion is 50% stenosed. RPDA lesion is 60% stenosed. Dist Cx lesion is 85% stenosed. Prox LAD to Mid LAD lesion is 30% stenosed. 1st Diag lesion is 30% stenosed.   1.  Right dominant coronary arteries.  Moderately to severely calcified coronary arteries with significant one-vessel coronary artery disease affecting the distal left circumflex supplying OM 3.  There is moderate stenosis in the distal right coronary artery into the right PDA. 2.  Normal LV systolic function mildly elevated left ventricular end-diastolic pressure.   Recommendations: The patient had apical ischemia on stress testing which does not correlate with the stenosis in the left circumflex.  The patient does require aggressive treatment of his risk factors considering the degree of atherosclerosis and calcifications. Recommend starting antianginal therapy.  I added  carvedilol 3.125 mg twice daily and this can be uptitrated as tolerated. If the patient has residual angina in spite of antianginal therapy, left circumflex PCI can be performed.  Stress Test 06/19/2019: Nuclear stress EF: 54%. The left ventricular ejection fraction is mildly decreased (45-54%). Blood pressure demonstrated a normal response to exercise. There was no ST segment deviation noted during stress. Defect 1: There is a small defect of mild severity present in the apical inferior location. Findings consistent with ischemia. This is a low risk study.   There is a small, mild, reversible perfusion defect at the inferoapex. Returns to normal at rest.  No ischemia noted on stress ECG.   Findings suggest a small area of ischemia at the apex on perfusion imaging. Mildly reduced exercise capacity. Normal HR and BP response to exercise. Test stopped due to angina.  Normal wall motion on perfusion imaging.    Overall low risk study with ischemia at the apex.    CT Chest 05/01/2019:   IMPRESSION: 1.  Emphysema (ICD10-J43.9). 2. Resolution of the ground-glass nodularity seen previously. No pulmonary nodules on today's study. No acute intrathoracic process. 3. Aortic Atherosclerosis (ICD10-I70.0). Severe coronary artery atherosclerosis.   EKG: EKG is personally reviewed. 04/02/22: SR, PVC  Recent Labs: No results found for requested labs within last 365 days.  Recent Lipid Panel    Component Value Date/Time   CHOL  03/05/2010 0310    90        ATP III CLASSIFICATION:  <200     mg/dL   Desirable  161-096  mg/dL   Borderline High  >=045    mg/dL   High          TRIG 66 03/05/2010 0310   HDL 32 (L) 03/05/2010 0310   CHOLHDL 2.8 03/05/2010 0310   VLDL 13 03/05/2010 0310   LDLCALC  03/05/2010 0310    45        Total Cholesterol/HDL:CHD Risk Coronary Heart Disease Risk Table                     Men   Women  1/2 Average Risk   3.4   3.3  Average Risk       5.0   4.4  2 X  Average Risk   9.6   7.1  3 X Average Risk  23.4   11.0        Use the calculated Patient Ratio above and the CHD Risk Table to determine the patient's CHD Risk.        ATP III CLASSIFICATION (LDL):  <100     mg/dL   Optimal  409-811  mg/dL   Near or Above                    Optimal  130-159  mg/dL   Borderline  914-782  mg/dL   High  >956     mg/dL   Very High    Physical Exam:    VS:  BP 130/78   Pulse 81   Ht 5\' 9"  (1.753 m)   Wt 234 lb 6.4 oz (106.3 kg)   BMI 34.61 kg/m     Wt Readings from Last 5 Encounters:  04/02/22 234 lb 6.4 oz (106.3 kg)  10/21/21 235 lb (106.6 kg)  06/23/21 233 lb 6.4 oz (105.9 kg)  02/09/21 234 lb (106.1 kg)  09/24/19 241 lb (109.3 kg)     Constitutional: No acute distress Eyes: sclera non-icteric, normal conjunctiva and lids ENMT: normal dentition, moist mucous membranes Cardiovascular: regular rhythm, normal rate, no murmurs. S1 and S2 normal. Radial pulses normal bilaterally. No jugular venous distention.  Respiratory: clear to auscultation bilaterally GI : normal bowel sounds, soft and nontender. No distention.   MSK: extremities warm, well perfused. No edema.  NEURO: grossly nonfocal exam, moves all extremities. PSYCH: alert and oriented x 3, normal mood and affect.   ASSESSMENT:    1. Coronary artery disease involving native coronary artery of native heart without angina pectoris   2. PVC's (premature ventricular contractions)   3. Essential hypertension   4. Hyperlipidemia LDL goal <70   5. Type 2 diabetes mellitus with complication, with long-term current use of insulin (HCC)   6. OSA (obstructive sleep apnea)      PLAN:    CAD  PVCs - carvedilol to 12.5 mg BID for elevated BP and palpitations.  - continue Asa 81 mg daily, atorvastatin 40 mg daily, imdur 30 mg daily and lisinopril 40 mg daily  HTN - carvedilol, imdur, lisinopril   HLD - continue atorvastatin. LDL excellent at 46, Trig 621 and need improvement. Dietary  factors reviewed today. Last A1C I see is 8.9%.   DM2 - On synjardy per pcp, agree with sglt2i use in CAD.   COPD - continue follow up with pulmonary med.   Total time of encounter: 30 minutes total time of encounter, including 20 minutes spent in face-to-face patient care on the date of this encounter. This time includes coordination of care and counseling regarding above mentioned problem list. Remainder of non-face-to-face time involved reviewing chart documents/testing relevant to the patient encounter and documentation in the medical record. I have independently reviewed documentation from referring provider.   Weston Brass, MD, [facility] [place]  [facility]     Medication Adjustments/Labs and Tests Ordered: Current medicines are reviewed at length with the patient today.  Concerns regarding medicines are outlined above.  Orders Placed This Encounter  Procedures   EKG 12-Lead   No orders of the defined types were placed in this encounter.  Patient Instructions  Medication Instructions:  No Changes In Medications at this time.  *If you need a refill on your cardiac medications before your next appointment, please call your pharmacy*  Follow-Up: At [facility], you and your health needs are our priority.  As part of our continuing mission to provide you with exceptional heart care, we have created designated Provider Care Teams.  These Care Teams include your primary Cardiologist (physician) and Advanced Practice Providers (APPs -  Physician Assistants and Nurse Practitioners) who all work together to provide you with the care you need, when you need it.  Your next appointment:   1 year(s)  Provider:   Parke Poisson, MD

## 2022-04-02 NOTE — Patient Instructions (Signed)
Medication Instructions:  No Changes In Medications at this time.  *If you need a refill on your cardiac medications before your next appointment, please call your pharmacy*  Follow-Up: At [facility], you and your health needs are our priority.  As part of our continuing mission to provide you with exceptional heart care, we have created designated Provider Care Teams.  These Care Teams include your primary Cardiologist (physician) and Advanced Practice Providers (APPs -  Physician Assistants and Nurse Practitioners) who all work together to provide you with the care you need, when you need it.  Your next appointment:   1 year(s)  Provider:   Elouise Munroe, MD

## 2022-04-14 NOTE — Progress Notes (Signed)
[facility] Note  04/27/2022     CHIEF COMPLAINT Patient presents for Retina Follow Up   HISTORY OF PRESENT ILLNESS: Juan [name] is a 62 y.o. male who presents to the clinic today for:   HPI     Retina Follow Up   Patient presents with  Diabetic Retinopathy.  In both eyes.  This started 5 weeks ago.  I, the attending physician,  performed the HPI with the patient and updated documentation appropriately.        Comments   Patient here for 5 weeks retina follow up for NPDR OU. Patient states vision seems to be getting a little weaker. Getting harder to read placket. No eye pain.       Last edited by Bernarda Caffey, MD on 04/27/2022  5:00 PM.       Referring physician: Harlan Stains, MD [place],  [place] [id number]  HISTORICAL INFORMATION:   Selected notes from the MEDICAL RECORD NUMBER Referred by Dr. Rutherford Guys for concern of CME s/p cataract sx   CURRENT MEDICATIONS: Current Outpatient Medications (Ophthalmic Drugs)  Medication Sig   dorzolamide-timolol (COSOPT) 22.3-6.8 MG/ML ophthalmic solution Place 1 drop into the right eye 2 (two) times daily. (Patient taking differently: Place 1 drop into both eyes 2 (two) times daily.)   No current facility-administered medications for this visit. (Ophthalmic Drugs)   Current Outpatient Medications (Other)  Medication Sig   ADVAIR DISKUS 500-50 MCG/DOSE AEPB Inhale 1 puff into the lungs 2 (two) times daily.   albuterol (VENTOLIN HFA) 108 (90 Base) MCG/ACT inhaler Inhale 1-2 puffs into the lungs every 6 (six) hours as needed for wheezing or shortness of breath.   alfuzosin (UROXATRAL) 10 MG 24 hr tablet TAKE 1 TABLET BY MOUTH EVERYDAY AT BEDTIME   aspirin EC 81 MG tablet Take 81 mg by mouth daily.   atorvastatin (LIPITOR) 40 MG tablet Take 1 tablet (40 mg total) by mouth daily.   carvedilol (COREG) 12.5 MG tablet Take 1 tablet (12.5 mg total) by mouth 2 (two) times  daily with a meal.   Empagliflozin-metFORMIN HCl (SYNJARDY) 12.06-998 MG TABS Take 1 tablet by mouth 2 (two) times a day.    esomeprazole (NEXIUM) 40 MG capsule Take 40 mg by mouth daily at 12 noon.   famotidine (PEPCID) 40 MG tablet Take by mouth.   insulin glargine, 2 Unit Dial, (TOUJEO MAX SOLOSTAR) 300 UNIT/ML Solostar Pen Inject 40 Units into the skin daily.    iron polysaccharides (NIFEREX) 150 MG capsule Take 150 mg by mouth daily.   isosorbide mononitrate (IMDUR) 30 MG 24 hr tablet Take 1 tablet (30 mg total) by mouth daily.   lisinopril (ZESTRIL) 40 MG tablet Take 1 tablet (40 mg total) by mouth daily.   nitroGLYCERIN (NITROSTAT) 0.4 MG SL tablet Place 0.4 mg under the tongue daily.   OZEMPIC, 0.25 OR 0.5 MG/DOSE, 2 MG/3ML SOPN Inject 0.25 mg into the skin once a week.   No current facility-administered medications for this visit. (Other)   REVIEW OF SYSTEMS: ROS   Positive for: Gastrointestinal, Musculoskeletal, Endocrine, Eyes, Respiratory Negative for: Constitutional, Neurological, Skin, Genitourinary, HENT, Cardiovascular, Psychiatric, Allergic/Imm, Heme/Lymph Last edited by Theodore Demark, COA on 04/27/2022  2:41 PM.      ALLERGIES Allergies  Allergen Reactions   Naproxen Shortness Of Breath   Naproxen Sodium Shortness Of Breath   Tiotropium Bromide Monohydrate Shortness Of Breath   Penicillins Rash  Has patient had a PCN reaction causing immediate rash, facial/tongue/throat swelling, SOB or lightheadedness with hypotension: Yes Has patient had a PCN reaction causing severe rash involving mucus membranes or skin necrosis: No Has patient had a PCN reaction that required hospitalization No Has patient had a PCN reaction occurring within the last 10 years: No If all of the above answers are "NO", then may proceed with Cephalosporin use.  Has patient had a PCN reaction causing immediate rash, facial/tongue/throat swelling, SOB or lightheadedness with hypotension:  Yes Has patient had a PCN reaction causing severe rash involving mucus membranes or skin necrosis: No Has patient had a PCN reaction that required hospitalization No Has patient had a PCN reaction occurring within the last 10 years: No If all of the above answers are "NO", then may proceed with Cephalosporin use.    PAST MEDICAL HISTORY Past Medical History:  Diagnosis Date   Arthritis    Asthma    COPD (chronic obstructive pulmonary disease) ([place])    Diabetes mellitus    Type 2   Diabetic retinopathy ([place])    NPDR OU   GERD (gastroesophageal reflux disease)    H/O hiatal hernia    History of kidney stones    Hyperlipemia    Hypertension    Hypertensive retinopathy    OU   Nasal polyps    Pneumonia 2014   Shortness of breath    Sleep apnea    pt has CPAP but is unable to use it d/t having polyps in his nose. Pt stated "I need to call and get a CPAP mask instead"   Stomach ulcer    Past Surgical History:  Procedure Laterality Date   CATARACT EXTRACTION Right 07/05/2018   Dr. Gershon Crane   CATARACT EXTRACTION Left 07/12/2018   Dr. Gershon Crane   CHOLECYSTECTOMY N/A 01/23/2015   Procedure: LAPAROSCOPIC CHOLECYSTECTOMY ;  Surgeon: Georganna Skeans, MD;  Location: [facility];  Service: General;  Laterality: N/A;   COLONOSCOPY W/ POLYPECTOMY     ESOPHAGOGASTRODUODENOSCOPY     EYE SURGERY     KNEE ARTHROSCOPY  03/24/2011   Procedure: ARTHROSCOPY KNEE;  Surgeon: Alta Corning, MD;  Location: [facility];  Service: Orthopedics;  Laterality: Right;  partial medial menisectomy, chondroplaty patella, and medial medial plica   LEFT HEART CATH AND CORONARY ANGIOGRAPHY N/A 06/29/2019   Procedure: LEFT HEART CATH AND CORONARY ANGIOGRAPHY;  Surgeon: Wellington Hampshire, MD;  Location: [facility];  Service: Cardiovascular;  Laterality: N/A;   FAMILY HISTORY Family History  Problem Relation Age of Onset   Diabetes Father    Coronary artery disease Father    SOCIAL HISTORY Social  History   Tobacco Use   Smoking status: Former    Packs/day: 3.00    Years: 20.00    Additional pack years: 0.00    Total pack years: 60.00    Types: Cigarettes   Smokeless tobacco: Current    Types: Chew   Tobacco comments:    1 can/daily  Vaping Use   Vaping Use: Never used  Substance Use Topics   Alcohol use: Yes    Comment: social, 1x weekly   Drug use: No       OPHTHALMIC EXAM:  Base Eye Exam     Visual Acuity (Snellen - Linear)       Right Left   Dist [place] 20/25 -2 20/40 -2   Dist ph [place] 20/20 -1 NI  Tonometry (Tonopen, 2:38 PM)       Right Left   Pressure 18 16         Pupils       Dark Light Shape React APD   Right 3 2 Round Brisk None   Left 3 2 Round Brisk None         Visual Fields (Counting fingers)       Left Right    Full Full         Extraocular Movement       Right Left    Full, Ortho Full, Ortho         Neuro/Psych     Oriented x3: Yes   Mood/Affect: Normal         Dilation     Both eyes: 1.0% Mydriacyl, 2.5% Phenylephrine @ 2:37 PM           Slit Lamp and Fundus Exam     Slit Lamp Exam       Right Left   Lids/Lashes Dermatochalasis - upper lid, Telangiectasia, mild Meibomian gland dysfunction Dermatochalasis - upper lid, Telangiectasia, mild Meibomian gland dysfunction   Conjunctiva/Sclera White and quiet Mild nasal Pinguecula   Cornea Mild Arcus, Well healed temporal cataract wounds Arcus, Well healed temporal cataract wounds, trace PEE   Anterior Chamber Deep and clear Deep and clear   Iris Round and dilated, mild patch of atrophy at 0800, no NVI Round and dilated, No NVI   Lens PC IOL in good position PC IOL in good position   Anterior Vitreous Vitreous syneresis Vitreous syneresis         Fundus Exam       Right Left   Disc trace pallor, Sharp rim Pallor, Sharp rim, Compact, mild PPP temporal   C/D Ratio 0.3 0.3   Macula Good foveal reflex, scattered IRH/DBH -- greatest temporal macula  -- improved, trace cystic changes temporal mac -- slightly increased, mild focal laser changes Blunted foveal reflex, +central atrophy and pigment clumping, scattered DBH and MAs, scattered punctate exudates - greatest superior and temporal macula -- persistent, cystic changes / edema temporal macula - persistent / slightly increased   Vessels attenuated, Tortuous attenuated, Tortuous   Periphery Attached, scattered DBH greatest posteriorly Attached, pigmented choroidal nevus superiorly with mild elevation, +drusen, no SRF or orange pigment -- unchanged from prior           IMAGING AND PROCEDURES  Imaging and Procedures for @TODAY @  OCT, Retina - OU - Both Eyes       Right Eye Quality was good. Central Foveal Thickness: 273. Progression has worsened. Findings include normal foveal contour, no SRF, intraretinal hyper-reflective material, intraretinal fluid, outer retinal atrophy, vitreomacular adhesion (Interval increase in cystic changes temporal and superior macula ).   Left Eye Quality was good. Central Foveal Thickness: 203. Progression has worsened. Findings include normal foveal contour, no SRF, intraretinal hyper-reflective material, intraretinal fluid, outer retinal atrophy (persistent IRF/IRHM greatest ST mac -- slightly increased, persistent central ORA; Sub-RPE hyper-reflective mass consistent with choroidal nevus -- superior to disc, caught on widefield -- stable from prior).   Notes *Images captured and stored on drive  Diagnosis / Impression: +DME OU OD: interval increase in cystic changes temporal and superior macula  OS: persistent IRF/IRHM greatest ST mac -- slightly improved, persistent central ORA; Sub-RPE hyper-reflective mass consistent with choroidal nevus -- superior to disc, caught on widefield -- stable from prior  Clinical management:  See below  Abbreviations: NFP -  Normal foveal profile. CME - cystoid macular edema. PED - pigment epithelial detachment. IRF -  intraretinal fluid. SRF - subretinal fluid. EZ - ellipsoid zone. ERM - epiretinal membrane. ORA - outer retinal atrophy. ORT - outer retinal tubulation. SRHM - subretinal hyper-reflective material      Intravitreal Injection, Pharmacologic Agent - OS - Left Eye       Time Out 04/27/2022. 3:21 PM. Confirmed correct patient, procedure, site, and patient consented.   Anesthesia Topical anesthesia was used. Anesthetic medications included Lidocaine 2%, Proparacaine 0.5%.   Procedure Preparation included 5% betadine to ocular surface, eyelid speculum. A (32g) needle was used.   Injection: 6 mg faricimab-svoa 6 MG/0.05ML   Route: Intravitreal, Site: Left Eye   NDCBD:[id number], Lot: [id number], Expiration date: 03/31/2024, Waste: 0 mL   Post-op Post injection exam found visual acuity of at least counting fingers. The patient tolerated the procedure well. There were no complications. The patient received written and verbal post procedure care education. Post injection medications were not given.            ASSESSMENT/PLAN:    ICD-10-CM   1. Severe nonproliferative diabetic retinopathy of both eyes with macular edema associated with type 2 diabetes mellitus (HCC)  E11.3413 OCT, Retina - OU - Both Eyes    Intravitreal Injection, Pharmacologic Agent - OS - Left Eye    faricimab-svoa (VABYSMO) 6mg /0.3mL intravitreal injection    2. 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 Essential hypertension  I10     4. Hypertensive retinopathy of both eyes  H35.033     5. Pseudophakia of both eyes  Z96.1     6. Ocular hypertension of right eye  H40.051     7. Monocular diplopia of both eyes  H53.2        1. Severe nonproliferative diabetic retinopathy with DME, OU (OS>OD)  - s/p IVA OD #1 (06.26.20), #2 (08.14.20), #3 (09.17.20), #4 (10.16.20), #5 (02.02.22)  - s/p IVA OS #1 (07.17.20), #2 (08.14.20), #3 (09.17.20)  **IVA RESISTANCE OU**  - s/p focal laser OD (07.18.23) - s/p IVE OD  #1 (11.13.20), #2 (12.11.20), #3 (01.18.21), #4 (02.19.21), #5 (03.19.21), #6 (04.16.21), #7 (05.14.21), #8 (06.17.21), #9 (07.16.21), #10 (09.21.21), #11 (10.29.21), #12 (11.29.21), #13 (12.29.21), #14 (03.09.22), #15 (04.06.22), #16 (05.11.12), #17 (06.15.22), #18 (7.20.22), #19 (08.24.22), #20 (10.04.22), #21 (11.08.22), #22 (12.13.22), #23 (02.21.23), #24 (03.28.23), #25 (05.09.23), #26 (06.20.23), #27 (08.01.23) - s/p IVE OS #1 (10.16.20) - sample, #2 (11.13.20), #3 (12.11.20), #4 (01.18.21), #5 (02.19.21), #6 (03.19.21), #7 (04.16.21), #8 (05.14.21), #9 (06.17.21), #10 (7.16.21), #11 (09.21.21), #12 (10.29.21), #13 (11.29.21), #14 (12.29.21), #15 (02.02.22, sample), #16 (03.09.22), #17 (04.06.22), #18 (05.11.2012), #19 (06.15.22), #20 (7.20.22), #21 (08.24.22), #22 (10.4.22), #23 (11.8.22), #24 (12.13.22), #25 (01.17.23), #26 (02.21.23), #27 (03.28.23). #28 (05.09.23), #29 (06.20.23), #30 (08.01.23), #31 (09.12.23), #32 (10.24.23), #33 (12.05.23) -- IVE resistance - s/p IVV OS #1 (01.16.24), #2 (02.20.24)  - FA (06.26.20) shows Severe NPDR OU w/ Late leaking MA OU, Enlarged FAZ OU, No NV OU, No significant hyperfluorescence of disc  - BCVA OD 20/20 -- improved; OS 20/40 -- stable  - OCT shows OD: interval increase in cystic changes temporal and superior macula; OS: persistent IRF/IRHM greatest ST mac -- slightly increased, persistent central ORA; Sub-RPE hyper-reflective mass consistent with choroidal nevus -- superior to disc, caught on widefield -- stable from prior at 5 weeks  - recommend IVV OS #3 today, 03.26.24 w/ f/u at 5 wks  -  will hold OD again today   - pt in agreement  - RBA of procedure discussed, questions answered  - informed consent obtained  - see procedure note  - [facility] informed consent form re-signed and scanned on 05.09.23  - Vabysmo informed consent form signed and scanned, 01.16.24 (OU)  - [email or web address] Benefits Investigation initiated 10.16.2020 -- approved for 2024  - Vabysmo  auth approved for 2024  - f/u 5 weeks, DFE, OCT, possible injections  2. Choroidal Nevus OS  - located at 1200, mild elevation, +drusen, no SRF  - baseline optos pictures obtained, 06.26.20  - discussed possible referral to oncology at [facility] in [place]  3,4. Hypertensive retinopathy OU  - discussed importance of tight BP control  - monitor  5. Pseudophakia OU  - s/p CE/IOL OU (OD on 06.03.20 and OS 06.10.20 by Dr. Gershon Crane)  - beautiful surgeries w/ IOLs in excellent position  - macular edema limiting vision as above  - monitor  6. Ocular hypertension   - IOP 13,16  - cont Cosopt qd OU  - monitor  7. Intermittent diplopia  - pt reports intermittent episodes of diplopia with both vertical and horizontal components; no rotational  - pt had appt with Dr. Clent Jacks on 11.9.23 -- noted some improvement with prism but did not recommend getting the prism at this time   Ophthalmic Meds Ordered this visit:  Meds ordered this encounter  Medications   faricimab-svoa (VABYSMO) 6mg /0.68mL intravitreal injection     Return in about 5 weeks (around 06/01/2022) for f/u NPDR OU, DFE, OCT.  There are no Patient Instructions on file for this visit.  This document serves as a record of services personally performed by Gardiner Sleeper, MD, PhD. It was created on their behalf by Renaldo Reel, [place] an ophthalmic technician. The creation of this record is the provider's dictation and/or activities during the visit.    Electronically signed by:  Renaldo Reel, COT  03.13.24 2:28 AM   This document serves as a record of services personally performed by Gardiner Sleeper, MD, PhD. It was created on their behalf by San Jetty. Owens Shark, OA an ophthalmic technician. The creation of this record is the provider's dictation and/or activities during the visit.    Electronically signed by: San Jetty. [place], [place] 03.26.2024 2:28 AM  Gardiner Sleeper, M.D., Ph.D. Diseases & Surgery of the Retina  and Vitreous [facility]  I have reviewed the above documentation for accuracy and completeness, and I agree with the above. Gardiner Sleeper, M.D., Ph.D. 04/29/22 2:30 AM   Abbreviations: M myopia (nearsighted); A astigmatism; H hyperopia (farsighted); P presbyopia; Mrx spectacle prescription;  CTL contact lenses; OD right eye; OS left eye; OU both eyes  XT exotropia; ET esotropia; PEK punctate epithelial keratitis; PEE punctate epithelial erosions; DES dry eye syndrome; MGD meibomian gland dysfunction; ATs artificial tears; PFAT's preservative free artificial tears; [place] nuclear sclerotic cataract; PSC posterior subcapsular cataract; ERM epi-retinal membrane; PVD posterior vitreous detachment; RD retinal detachment; DM diabetes mellitus; DR diabetic retinopathy; NPDR non-proliferative diabetic retinopathy; PDR proliferative diabetic retinopathy; CSME clinically significant macular edema; DME diabetic macular edema; dbh dot blot hemorrhages; CWS cotton wool spot; POAG primary open angle glaucoma; C/D cup-to-disc ratio; HVF humphrey visual field; GVF goldmann visual field; OCT optical coherence tomography; IOP intraocular pressure; BRVO Branch retinal vein occlusion; CRVO central retinal vein occlusion; CRAO central retinal artery occlusion; BRAO branch retinal artery occlusion; RT retinal tear; SB scleral  buckle; PPV pars plana vitrectomy; VH Vitreous hemorrhage; PRP panretinal laser photocoagulation; IVK intravitreal kenalog; VMT vitreomacular traction; MH Macular hole;  NVD neovascularization of the disc; NVE neovascularization elsewhere; AREDS age related eye disease study; ARMD age related macular degeneration; POAG primary open angle glaucoma; EBMD epithelial/anterior basement membrane dystrophy; ACIOL anterior chamber intraocular lens; IOL intraocular lens; PCIOL posterior chamber intraocular lens; Phaco/IOL phacoemulsification with intraocular lens placement; [place] photorefractive  keratectomy; LASIK laser assisted in situ keratomileusis; HTN hypertension; DM diabetes mellitus; COPD chronic obstructive pulmonary disease

## 2022-04-27 ENCOUNTER — Encounter (INDEPENDENT_AMBULATORY_CARE_PROVIDER_SITE_OTHER): Payer: Self-pay | Admitting: Ophthalmology

## 2022-04-27 ENCOUNTER — Ambulatory Visit (INDEPENDENT_AMBULATORY_CARE_PROVIDER_SITE_OTHER): Payer: BC Managed Care – PPO | Admitting: Ophthalmology

## 2022-04-27 DIAGNOSIS — H532 Diplopia: Secondary | ICD-10-CM

## 2022-04-27 DIAGNOSIS — H35033 Hypertensive retinopathy, bilateral: Secondary | ICD-10-CM | POA: Diagnosis not present

## 2022-04-27 DIAGNOSIS — E113413 Type 2 diabetes mellitus with severe nonproliferative diabetic retinopathy with macular edema, bilateral: Secondary | ICD-10-CM | POA: Diagnosis not present

## 2022-04-27 DIAGNOSIS — I10 Essential (primary) hypertension: Secondary | ICD-10-CM

## 2022-04-27 DIAGNOSIS — D3132 Benign neoplasm of left choroid: Secondary | ICD-10-CM | POA: Diagnosis not present

## 2022-04-27 DIAGNOSIS — H40051 Ocular hypertension, right eye: Secondary | ICD-10-CM

## 2022-04-27 DIAGNOSIS — Z961 Presence of intraocular lens: Secondary | ICD-10-CM

## 2022-04-27 MED ORDER — FARICIMAB-SVOA 6 MG/0.05ML IZ SOLN
6.0000 mg | INTRAVITREAL | Status: AC | PRN
  Administered 2022-04-27: 6 mg via INTRAVITREAL

## 2022-05-18 NOTE — Progress Notes (Addendum)
[facility] - Clinic Note  06/01/2022     CHIEF COMPLAINT Patient presents for Retina Follow Up   HISTORY OF PRESENT ILLNESS: Juan [name] is a 62 y.o. male who presents to the clinic today for:   HPI     Retina Follow Up   Patient presents with  Diabetic Retinopathy.  In both eyes.  This started 5 weeks ago.  I, the attending physician,  performed the HPI with the patient and updated documentation appropriately.        Comments   Patient here for 5 weeks retina follow up for NPDR OU. Patient states vision doing ok. In AM getting a lot of floaters. Clears up later. Far away vision blurrier than used to be. No eye pain.       Last edited by Rennis Chris, MD on 06/01/2022  3:54 PM.    Pt states his vision is blurry, especially at work  Referring physician: [facility] [address] [place],  [place] [address]  HISTORICAL INFORMATION:   Selected notes from the MEDICAL RECORD NUMBER Referred by Dr. Jethro Bolus for concern of CME s/p cataract sx   CURRENT MEDICATIONS: Current Outpatient Medications (Ophthalmic Drugs)  Medication Sig   dorzolamide-timolol (COSOPT) 22.3-6.8 MG/ML ophthalmic solution Place 1 drop into the right eye 2 (two) times daily. (Patient taking differently: Place 1 drop into both eyes 2 (two) times daily.)   No current facility-administered medications for this visit. (Ophthalmic Drugs)   Current Outpatient Medications (Other)  Medication Sig   ADVAIR DISKUS 500-50 MCG/DOSE AEPB Inhale 1 puff into the lungs 2 (two) times daily.   albuterol (VENTOLIN HFA) 108 (90 Base) MCG/ACT inhaler Inhale 1-2 puffs into the lungs every 6 (six) hours as needed for wheezing or shortness of breath.   alfuzosin (UROXATRAL) 10 MG 24 hr tablet TAKE 1 TABLET BY MOUTH EVERYDAY AT BEDTIME   aspirin EC 81 MG tablet Take 81 mg by mouth daily.   atorvastatin (LIPITOR) 40 MG tablet Take 1 tablet (40 mg total) by mouth daily.    carvedilol (COREG) 12.5 MG tablet Take 1 tablet (12.5 mg total) by mouth 2 (two) times daily with a meal.   Empagliflozin-metFORMIN HCl (SYNJARDY) 12.06-998 MG TABS Take 1 tablet by mouth 2 (two) times a day.    esomeprazole (NEXIUM) 40 MG capsule Take 40 mg by mouth daily at 12 noon.   famotidine (PEPCID) 40 MG tablet Take by mouth.   insulin glargine, 2 Unit Dial, (TOUJEO MAX SOLOSTAR) 300 UNIT/ML Solostar Pen Inject 40 Units into the skin daily.    iron polysaccharides (NIFEREX) 150 MG capsule Take 150 mg by mouth daily.   isosorbide mononitrate (IMDUR) 30 MG 24 hr tablet Take 1 tablet (30 mg total) by mouth daily.   lisinopril (ZESTRIL) 40 MG tablet Take 1 tablet (40 mg total) by mouth daily.   nitroGLYCERIN (NITROSTAT) 0.4 MG SL tablet Place 0.4 mg under the tongue daily.   OZEMPIC, 0.25 OR 0.5 MG/DOSE, 2 MG/3ML SOPN Inject 0.25 mg into the skin once a week.   No current facility-administered medications for this visit. (Other)   REVIEW OF SYSTEMS: ROS   Positive for: Gastrointestinal, Musculoskeletal, Endocrine, Eyes, Respiratory Negative for: Constitutional, Neurological, Skin, Genitourinary, HENT, Cardiovascular, Psychiatric, Allergic/Imm, Heme/Lymph Last edited by [name], COA on 06/01/2022  2:44 PM.       ALLERGIES Allergies  Allergen Reactions   Naproxen Shortness Of Breath  Naproxen Sodium Shortness Of Breath   Tiotropium Bromide Monohydrate Shortness Of Breath   Penicillins Rash    Has patient had a PCN reaction causing immediate rash, facial/tongue/throat swelling, SOB or lightheadedness with hypotension: Yes Has patient had a PCN reaction causing severe rash involving mucus membranes or skin necrosis: No Has patient had a PCN reaction that required hospitalization No Has patient had a PCN reaction occurring within the last 10 years: No If all of the above answers are "NO", then may proceed with Cephalosporin use.  Has patient had a PCN reaction causing  immediate rash, facial/tongue/throat swelling, SOB or lightheadedness with hypotension: Yes Has patient had a PCN reaction causing severe rash involving mucus membranes or skin necrosis: No Has patient had a PCN reaction that required hospitalization No Has patient had a PCN reaction occurring within the last 10 years: No If all of the above answers are "NO", then may proceed with Cephalosporin use.    PAST MEDICAL HISTORY Past Medical History:  Diagnosis Date   Arthritis    Asthma    COPD (chronic obstructive pulmonary disease) (HCC)    Diabetes mellitus    Type 2   Diabetic retinopathy (HCC)    NPDR OU   GERD (gastroesophageal reflux disease)    H/O hiatal hernia    History of kidney stones    Hyperlipemia    Hypertension    Hypertensive retinopathy    OU   Nasal polyps    Pneumonia 2014   Shortness of breath    Sleep apnea    pt has CPAP but is unable to use it d/t having polyps in his nose. Pt stated "I need to call and get a CPAP mask instead"   Stomach ulcer    Past Surgical History:  Procedure Laterality Date   CATARACT EXTRACTION Right 07/05/2018   Dr. Nile Riggs   CATARACT EXTRACTION Left 07/12/2018   Dr. Nile Riggs   CHOLECYSTECTOMY N/A 01/23/2015   Procedure: LAPAROSCOPIC CHOLECYSTECTOMY ;  Surgeon: Violeta Gelinas, MD;  Location: [facility];  Service: General;  Laterality: N/A;   COLONOSCOPY W/ POLYPECTOMY     ESOPHAGOGASTRODUODENOSCOPY     EYE SURGERY     KNEE ARTHROSCOPY  03/24/2011   Procedure: ARTHROSCOPY KNEE;  Surgeon: Harvie Junior, MD;  Location: [facility];  Service: Orthopedics;  Laterality: Right;  partial medial menisectomy, chondroplaty patella, and medial medial plica   LEFT HEART CATH AND CORONARY ANGIOGRAPHY N/A 06/29/2019   Procedure: LEFT HEART CATH AND CORONARY ANGIOGRAPHY;  Surgeon: Iran Ouch, MD;  Location: [facility];  Service: Cardiovascular;  Laterality: N/A;   FAMILY HISTORY Family History  Problem Relation Age of  Onset   Diabetes Father    Coronary artery disease Father    SOCIAL HISTORY Social History   Tobacco Use   Smoking status: Former    Packs/day: 3.00    Years: 20.00    Additional pack years: 0.00    Total pack years: 60.00    Types: Cigarettes   Smokeless tobacco: Current    Types: Chew   Tobacco comments:    1 can/daily  Vaping Use   Vaping Use: Never used  Substance Use Topics   Alcohol use: Yes    Comment: social, 1x weekly   Drug use: No       OPHTHALMIC EXAM:  Base Eye Exam     Visual Acuity (Snellen - Linear)       Right Left   Dist   20/30 20/40 -1   Dist ph [place] 20/20 -2          Tonometry (Tonopen, 2:41 PM)       Right Left   Pressure 15 16         Pupils       Dark Light Shape React APD   Right 3 2 Round Brisk None   Left 3 2 Round Brisk None         Visual Fields (Counting fingers)       Left Right    Full Full         Extraocular Movement       Right Left    Full, Ortho Full, Ortho         Neuro/Psych     Oriented x3: Yes   Mood/Affect: Normal         Dilation     Both eyes: 1.0% Mydriacyl, 2.5% Phenylephrine @ 2:41 PM           Slit Lamp and Fundus Exam     Slit Lamp Exam       Right Left   Lids/Lashes Dermatochalasis - upper lid, Telangiectasia, mild Meibomian gland dysfunction Dermatochalasis - upper lid, Telangiectasia, mild Meibomian gland dysfunction   Conjunctiva/Sclera White and quiet Mild nasal Pinguecula   Cornea Mild Arcus, Well healed temporal cataract wounds Arcus, Well healed temporal cataract wounds, trace PEE   Anterior Chamber Deep and clear Deep and clear   Iris Round and dilated, mild patch of atrophy at 0800, no NVI Round and dilated, No NVI   Lens PC IOL in good position PC IOL in good position   Anterior Vitreous Vitreous syneresis Vitreous syneresis         Fundus Exam       Right Left   Disc trace pallor, Sharp rim Pallor, Sharp rim, Compact, mild PPP temporal   C/D Ratio  0.3 0.3   Macula Good foveal reflex, scattered IRH/DBH -- greatest temporal macula -- improved, trace cystic changes temporal mac -- slightly improved, mild focal laser changes Blunted foveal reflex, +central atrophy and pigment clumping, scattered DBH and MAs, scattered punctate exudates - greatest superior and temporal macula -- persistent, cystic changes / edema temporal macula - persistent / slightly improved   Vessels attenuated, Tortuous attenuated, Tortuous   Periphery Attached, scattered DBH greatest posteriorly Attached, pigmented choroidal nevus superiorly with mild elevation, +drusen, no SRF or orange pigment -- unchanged from prior           IMAGING AND PROCEDURES  Imaging and Procedures for @TODAY @  OCT, Retina - OU - Both Eyes       Right Eye Quality was good. Central Foveal Thickness: 265. Progression has improved. Findings include normal foveal contour, no SRF, intraretinal hyper-reflective material, intraretinal fluid, outer retinal atrophy, vitreomacular adhesion (Mild Interval improvement in cystic changes temporal and superior macula ).   Left Eye Quality was good. Central Foveal Thickness: 204. Progression has improved. Findings include normal foveal contour, no SRF, intraretinal hyper-reflective material, intraretinal fluid, outer retinal atrophy (Mild interval improvement in IRF/IRHM greatest ST mac, persistent central ORA; Sub-RPE hyper-reflective mass consistent with choroidal nevus -- superior to disc, caught on widefield -- stable from prior).   Notes *Images captured and stored on drive  Diagnosis / Impression: +DME OU OD: mild interval improvement in cystic changes temporal and superior macula  OS: mild interval improvement in IRF/IRHM greatest ST mac, persistent central ORA; Sub-RPE hyper-reflective mass consistent with choroidal  nevus -- superior to disc, caught on widefield -- stable from prior  Clinical management:  See below  Abbreviations: NFP -  Normal foveal profile. CME - cystoid macular edema. PED - pigment epithelial detachment. IRF - intraretinal fluid. SRF - subretinal fluid. EZ - ellipsoid zone. ERM - epiretinal membrane. ORA - outer retinal atrophy. ORT - outer retinal tubulation. SRHM - subretinal hyper-reflective material      Intravitreal Injection, Pharmacologic Agent - OS - Left Eye       Time Out 06/01/2022. 3:00 PM. Confirmed correct patient, procedure, site, and patient consented.   Anesthesia Topical anesthesia was used. Anesthetic medications included Lidocaine 2%, Proparacaine 0.5%.   Procedure Preparation included 5% betadine to ocular surface, eyelid speculum. A (32g) needle was used.   Injection: 6 mg faricimab-svoa 6 MG/0.05ML   Route: Intravitreal, Site: Left Eye   NDC: O8010301, Lot: [id number], Expiration date: 05/01/2024, Waste: 0 mL   Post-op Post injection exam found visual acuity of at least counting fingers. The patient tolerated the procedure well. There were no complications. The patient received written and verbal post procedure care education. Post injection medications were not given.             ASSESSMENT/PLAN:    ICD-10-CM   1. Severe nonproliferative diabetic retinopathy of both eyes with macular edema associated with type 2 diabetes mellitus (HCC)  E11.3413 OCT, Retina - OU - Both Eyes    Intravitreal Injection, Pharmacologic Agent - OS - Left Eye    faricimab-svoa (VABYSMO) 6mg /0.70mL intravitreal injection    2. Current use of insulin (HCC)  Z79.4     3. Long term (current) use of oral hypoglycemic drugs  Z79.84     4. 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 Essential hypertension  I10     6. Hypertensive retinopathy of both eyes  H35.033     7. Pseudophakia of both eyes  Z96.1     8. Ocular hypertension of right eye  H40.051     9. Diplopia  H53.2      1-3. Severe nonproliferative diabetic retinopathy with DME, OU (OS>OD)  - s/p IVA OD #1 (06.26.20), #2  (08.14.20), #3 (09.17.20), #4 (10.16.20), #5 (02.02.22)  - s/p IVA OS #1 (07.17.20), #2 (08.14.20), #3 (09.17.20)  **IVA RESISTANCE OU**  - s/p focal laser OD (07.18.23) - s/p IVE OD #1 (11.13.20), #2 (12.11.20), #3 (01.18.21), #4 (02.19.21), #5 (03.19.21), #6 (04.16.21), #7 (05.14.21), #8 (06.17.21), #9 (07.16.21), #10 (09.21.21), #11 (10.29.21), #12 (11.29.21), #13 (12.29.21), #14 (03.09.22), #15 (04.06.22), #16 (05.11.12), #17 (06.15.22), #18 (7.20.22), #19 (08.24.22), #20 (10.04.22), #21 (11.08.22), #22 (12.13.22), #23 (02.21.23), #24 (03.28.23), #25 (05.09.23), #26 (06.20.23), #27 (08.01.23) - s/p IVE OS #1 (10.16.20) - sample, #2 (11.13.20), #3 (12.11.20), #4 (01.18.21), #5 (02.19.21), #6 (03.19.21), #7 (04.16.21), #8 (05.14.21), #9 (06.17.21), #10 (7.16.21), #11 (09.21.21), #12 (10.29.21), #13 (11.29.21), #14 (12.29.21), #15 (02.02.22, sample), #16 (03.09.22), #17 (04.06.22), #18 (05.11.2012), #19 (06.15.22), #20 (7.20.22), #21 (08.24.22), #22 (10.4.22), #23 (11.8.22), #24 (12.13.22), #25 (01.17.23), #26 (02.21.23), #27 (03.28.23). #28 (05.09.23), #29 (06.20.23), #30 (08.01.23), #31 (09.12.23), #32 (10.24.23), #33 (12.05.23) -- IVE resistance - s/p IVV OS #1 (01.16.24), #2 (02.20.24), #3 (03.26.24)  - FA (06.26.20) shows Severe NPDR OU w/ Late leaking MA OU, Enlarged FAZ OU, No NV OU, No significant hyperfluorescence of disc  - BCVA OD 20/20 -- stable; OS 20/40 -- stable  - OCT shows OD: mild interval improvement in cystic changes temporal and superior macula;  OS: mild interval improvement in IRF/IRHM greatest ST mac, persistent central ORA; Sub-RPE hyper-reflective mass consistent with choroidal nevus -- superior to disc, caught on widefield -- stable from prior at 5 weeks  - recommend IVV OS #4 today, 04.30.24 w/ f/u at 5 wks  - will hold injection OD again today   - pt in agreement  - RBA of procedure discussed, questions answered  - informed consent obtained  - see procedure note  - Eylea  informed consent form re-signed and scanned on 05.09.23  - Vabysmo informed consent form signed and scanned, 01.16.24 (OU)  - [email or web address] Benefits Investigation initiated 10.16.2020 -- approved for 2024  - Vabysmo auth approved for 2024  - f/u 5 weeks, DFE, OCT, possible injections  4. Choroidal Nevus OS  - located at 1200, mild elevation, +drusen, no SRF  - baseline optos pictures obtained, 06.26.20  - discussed possible referral to oncology at [facility] in [place]  5,6. Hypertensive retinopathy OU  - discussed importance of tight BP control  - monitor  7. Pseudophakia OU  - s/p CE/IOL OU (OD on 06.03.20 and OS 06.10.20 by Dr. Nile Riggs)  - beautiful surgeries w/ IOLs in excellent position  - macular edema limiting vision as above  - monitor  8. Ocular hypertension   - IOP 12,16  - cont Cosopt qd OU  - monitor  9. Intermittent diplopia  - pt reports intermittent episodes of diplopia with both vertical and horizontal components; no rotational  - pt had appt with Dr. Ernesto Rutherford on 11.9.23 -- noted some improvement with prism but did not recommend getting the prism at this time   Ophthalmic Meds Ordered this visit:  Meds ordered this encounter  Medications   faricimab-svoa (VABYSMO) 6mg /0.72mL intravitreal injection     Return in about 5 weeks (around 07/06/2022) for f/u NPDR OU, DFE, OCT.  There are no Patient Instructions on file for this visit.  This document serves as a record of services personally performed by Karie Chimera, MD, PhD. It was created on their behalf by Gerilyn Nestle, COT an ophthalmic technician. The creation of this record is the provider's dictation and/or activities during the visit.    Electronically signed by:  Gerilyn Nestle, COT  04.16.24 3:57 PM   This document serves as a record of services personally performed by Karie Chimera, MD, PhD. It was created on their behalf by Glee Arvin. Manson Passey, OA an ophthalmic technician. The creation of  this record is the provider's dictation and/or activities during the visit.    Electronically signed by: Glee Arvin. Manson Passey, [place] 04.30.2024 3:57 PM  Karie Chimera, M.D., Ph.D. Diseases & Surgery of the Retina and Vitreous [facility]  I have reviewed the above documentation for accuracy and completeness, and I agree with the above. Karie Chimera, M.D., Ph.D. 06/01/22 3:57 PM   Abbreviations: M myopia (nearsighted); A astigmatism; H hyperopia (farsighted); P presbyopia; Mrx spectacle prescription;  CTL contact lenses; OD right eye; OS left eye; OU both eyes  XT exotropia; ET esotropia; PEK punctate epithelial keratitis; PEE punctate epithelial erosions; DES dry eye syndrome; MGD meibomian gland dysfunction; ATs artificial tears; PFAT's preservative free artificial tears; NSC nuclear sclerotic cataract; PSC posterior subcapsular cataract; ERM epi-retinal membrane; PVD posterior vitreous detachment; RD retinal detachment; DM diabetes mellitus; DR diabetic retinopathy; NPDR non-proliferative diabetic retinopathy; PDR proliferative diabetic retinopathy; CSME clinically significant macular edema; DME diabetic macular edema; dbh dot blot hemorrhages; CWS cotton wool  spot; POAG primary open angle glaucoma; C/D cup-to-disc ratio; HVF humphrey visual field; GVF goldmann visual field; OCT optical coherence tomography; IOP intraocular pressure; BRVO Branch retinal vein occlusion; CRVO central retinal vein occlusion; CRAO central retinal artery occlusion; BRAO branch retinal artery occlusion; RT retinal tear; SB scleral buckle; PPV pars plana vitrectomy; VH Vitreous hemorrhage; PRP panretinal laser photocoagulation; IVK intravitreal kenalog; VMT vitreomacular traction; MH Macular hole;  NVD neovascularization of the disc; NVE neovascularization elsewhere; AREDS age related eye disease study; ARMD age related macular degeneration; POAG primary open angle glaucoma; EBMD epithelial/anterior  basement membrane dystrophy; ACIOL anterior chamber intraocular lens; IOL intraocular lens; PCIOL posterior chamber intraocular lens; Phaco/IOL phacoemulsification with intraocular lens placement; PRK photorefractive keratectomy; LASIK laser assisted in situ keratomileusis; HTN hypertension; DM diabetes mellitus; COPD chronic obstructive pulmonary disease

## 2022-06-01 ENCOUNTER — Ambulatory Visit (INDEPENDENT_AMBULATORY_CARE_PROVIDER_SITE_OTHER): Payer: BC Managed Care – PPO | Admitting: Ophthalmology

## 2022-06-01 ENCOUNTER — Encounter (INDEPENDENT_AMBULATORY_CARE_PROVIDER_SITE_OTHER): Payer: Self-pay | Admitting: Ophthalmology

## 2022-06-01 DIAGNOSIS — D3132 Benign neoplasm of left choroid: Secondary | ICD-10-CM

## 2022-06-01 DIAGNOSIS — I10 Essential (primary) hypertension: Secondary | ICD-10-CM

## 2022-06-01 DIAGNOSIS — Z794 Long term (current) use of insulin: Secondary | ICD-10-CM | POA: Diagnosis not present

## 2022-06-01 DIAGNOSIS — E113413 Type 2 diabetes mellitus with severe nonproliferative diabetic retinopathy with macular edema, bilateral: Secondary | ICD-10-CM | POA: Diagnosis not present

## 2022-06-01 DIAGNOSIS — H532 Diplopia: Secondary | ICD-10-CM

## 2022-06-01 DIAGNOSIS — Z7984 Long term (current) use of oral hypoglycemic drugs: Secondary | ICD-10-CM | POA: Diagnosis not present

## 2022-06-01 DIAGNOSIS — H35033 Hypertensive retinopathy, bilateral: Secondary | ICD-10-CM

## 2022-06-01 DIAGNOSIS — H40051 Ocular hypertension, right eye: Secondary | ICD-10-CM

## 2022-06-01 DIAGNOSIS — Z961 Presence of intraocular lens: Secondary | ICD-10-CM

## 2022-06-01 MED ORDER — FARICIMAB-SVOA 6 MG/0.05ML IZ SOLN
6.0000 mg | INTRAVITREAL | Status: AC | PRN
  Administered 2022-06-01: 6 mg via INTRAVITREAL

## 2022-06-16 DIAGNOSIS — R0981 Nasal congestion: Secondary | ICD-10-CM | POA: Diagnosis not present

## 2022-06-16 DIAGNOSIS — Z03818 Encounter for observation for suspected exposure to other biological agents ruled out: Secondary | ICD-10-CM | POA: Diagnosis not present

## 2022-06-16 DIAGNOSIS — J0110 Acute frontal sinusitis, unspecified: Secondary | ICD-10-CM | POA: Diagnosis not present

## 2022-06-16 DIAGNOSIS — J4521 Mild intermittent asthma with (acute) exacerbation: Secondary | ICD-10-CM | POA: Diagnosis not present

## 2022-06-22 NOTE — Progress Notes (Signed)
[facility] - Clinic Note  07/06/2022     CHIEF COMPLAINT Patient presents for Retina Follow Up   HISTORY OF PRESENT ILLNESS: ASHELY [name] is a 61 y.o. male who presents to the clinic today for:   HPI     Retina Follow Up   Patient presents with  Diabetic Retinopathy.  In both eyes.  This started 5 weeks ago.  Duration of 5 weeks.  Since onset it is stable.  I, the attending physician,  performed the HPI with the patient and updated documentation appropriately.        Comments   5 week retina follow up NPDR and IVV OS pt is reporting no vision changes noticed he denies any flashes or floaters last reading 107      Last edited by Rennis Chris, MD on 07/06/2022  9:38 PM.     Pt states his vision has been okay, but today, everything is "a blur", he states he did a lot of "small reading" this morning, pt states he was switched from Advair to Symbicort and has been coughing up mucus  Referring physician: Laurann Montana, MD [phone] [address] [place],  [place] [address]  HISTORICAL INFORMATION:   Selected notes from the MEDICAL RECORD NUMBER Referred by Dr. Jethro Bolus for concern of CME s/p cataract sx   CURRENT MEDICATIONS: Current Outpatient Medications (Ophthalmic Drugs)  Medication Sig   dorzolamide-timolol (COSOPT) 22.3-6.8 MG/ML ophthalmic solution Place 1 drop into the right eye 2 (two) times daily. (Patient taking differently: Place 1 drop into both eyes 2 (two) times daily.)   No current facility-administered medications for this visit. (Ophthalmic Drugs)   Current Outpatient Medications (Other)  Medication Sig   ADVAIR DISKUS 500-50 MCG/DOSE AEPB Inhale 1 puff into the lungs 2 (two) times daily.   albuterol (VENTOLIN HFA) 108 (90 Base) MCG/ACT inhaler Inhale 1-2 puffs into the lungs every 6 (six) hours as needed for wheezing or shortness of breath.   alfuzosin (UROXATRAL) 10 MG 24 hr tablet TAKE 1 TABLET BY MOUTH EVERYDAY AT BEDTIME    aspirin EC 81 MG tablet Take 81 mg by mouth daily.   atorvastatin (LIPITOR) 40 MG tablet Take 1 tablet (40 mg total) by mouth daily.   carvedilol (COREG) 12.5 MG tablet Take 1 tablet (12.5 mg total) by mouth 2 (two) times daily with a meal.   Empagliflozin-metFORMIN HCl (SYNJARDY) 12.06-998 MG TABS Take 1 tablet by mouth 2 (two) times a day.    esomeprazole (NEXIUM) 40 MG capsule Take 40 mg by mouth daily at 12 noon.   famotidine (PEPCID) 40 MG tablet Take by mouth.   insulin glargine, 2 Unit Dial, (TOUJEO MAX SOLOSTAR) 300 UNIT/ML Solostar Pen Inject 40 Units into the skin daily.    iron polysaccharides (NIFEREX) 150 MG capsule Take 150 mg by mouth daily.   isosorbide mononitrate (IMDUR) 30 MG 24 hr tablet Take 1 tablet (30 mg total) by mouth daily.   lisinopril (ZESTRIL) 40 MG tablet Take 1 tablet (40 mg total) by mouth daily.   nitroGLYCERIN (NITROSTAT) 0.4 MG SL tablet Place 0.4 mg under the tongue daily.   OZEMPIC, 0.25 OR 0.5 MG/DOSE, 2 MG/3ML SOPN Inject 0.25 mg into the skin once a week.   No current facility-administered medications for this visit. (Other)   REVIEW OF SYSTEMS: ROS   Positive for: Gastrointestinal, Musculoskeletal, Endocrine, Eyes, Respiratory Negative for: Constitutional, Neurological, Skin, Genitourinary, HENT, Cardiovascular, Psychiatric, Allergic/Imm, Heme/Lymph Last edited by  [name], COT on 07/06/2022  2:15 PM.        ALLERGIES Allergies  Allergen Reactions   Naproxen Shortness Of Breath   Naproxen Sodium Shortness Of Breath   Tiotropium Bromide Monohydrate Shortness Of Breath   Penicillins Rash    Has patient had a PCN reaction causing immediate rash, facial/tongue/throat swelling, SOB or lightheadedness with hypotension: Yes Has patient had a PCN reaction causing severe rash involving mucus membranes or skin necrosis: No Has patient had a PCN reaction that required hospitalization No Has patient had a PCN reaction occurring within  the last 10 years: No If all of the above answers are "NO", then may proceed with Cephalosporin use.  Has patient had a PCN reaction causing immediate rash, facial/tongue/throat swelling, SOB or lightheadedness with hypotension: Yes Has patient had a PCN reaction causing severe rash involving mucus membranes or skin necrosis: No Has patient had a PCN reaction that required hospitalization No Has patient had a PCN reaction occurring within the last 10 years: No If all of the above answers are "NO", then may proceed with Cephalosporin use.    PAST MEDICAL HISTORY Past Medical History:  Diagnosis Date   Arthritis    Asthma    COPD (chronic obstructive pulmonary disease) (HCC)    Diabetes mellitus    Type 2   Diabetic retinopathy (HCC)    NPDR OU   GERD (gastroesophageal reflux disease)    H/O hiatal hernia    History of kidney stones    Hyperlipemia    Hypertension    Hypertensive retinopathy    OU   Nasal polyps    Pneumonia 2014   Shortness of breath    Sleep apnea    pt has CPAP but is unable to use it d/t having polyps in his nose. Pt stated "I need to call and get a CPAP mask instead"   Stomach ulcer    Past Surgical History:  Procedure Laterality Date   CATARACT EXTRACTION Right 07/05/2018   Dr. Nile Riggs   CATARACT EXTRACTION Left 07/12/2018   Dr. Nile Riggs   CHOLECYSTECTOMY N/A 01/23/2015   Procedure: LAPAROSCOPIC CHOLECYSTECTOMY ;  Surgeon: Violeta Gelinas, MD;  [facility];  Service: General;  Laterality: N/A;   COLONOSCOPY W/ POLYPECTOMY     ESOPHAGOGASTRODUODENOSCOPY     EYE SURGERY     KNEE ARTHROSCOPY  03/24/2011   Procedure: ARTHROSCOPY KNEE;  Surgeon: Harvie Junior, MD;  Location: [facility];  Service: Orthopedics;  Laterality: Right;  partial medial menisectomy, chondroplaty patella, and medial medial plica   LEFT HEART CATH AND CORONARY ANGIOGRAPHY N/A 06/29/2019   Procedure: LEFT HEART CATH AND CORONARY ANGIOGRAPHY;  Surgeon: Iran Ouch, MD;  Location: [facility] INVAS[facility];  Service: Cardiovascular;  Laterality: N/A;   FAMILY HISTORY Family History  Problem Relation Age of Onset   Diabetes Father    Coronary artery disease Father    SOCIAL HISTORY Social History   Tobacco Use   Smoking status: Former    Packs/day: 3.00    Years: 20.00    Additional pack years: 0.00    Total pack years: 60.00    Types: Cigarettes   Smokeless tobacco: Current    Types: Chew   Tobacco comments:    1 can/daily  Vaping Use   Vaping Use: Never used  Substance Use Topics   Alcohol use: Yes    Comment: social, 1x weekly   Drug use: No  OPHTHALMIC EXAM:  Base Eye Exam     Visual Acuity (Snellen - Linear)       Right Left   Dist [place] 20/30 -1 20/50 -2   Dist ph  20/25 -2 20/40 -2         Tonometry (Tonopen, 2:22 PM)       Right Left   Pressure 14 17         Pupils       Pupils Dark Light Shape React APD   Right PERRL 3 2 Round Brisk None   Left PERRL 3 2 Round Brisk None         Visual Fields       Left Right    Full Full         Extraocular Movement       Right Left    Full, Ortho Full, Ortho         Neuro/Psych     Oriented x3: Yes   Mood/Affect: Normal         Dilation     Both eyes: 2.5% Phenylephrine @ 2:22 PM           Slit Lamp and Fundus Exam     Slit Lamp Exam       Right Left   Lids/Lashes Dermatochalasis - upper lid, Telangiectasia, mild Meibomian gland dysfunction Dermatochalasis - upper lid, Telangiectasia, mild Meibomian gland dysfunction   Conjunctiva/Sclera White and quiet Mild nasal Pinguecula   Cornea Mild Arcus, Well healed temporal cataract wounds Arcus, Well healed temporal cataract wounds, trace PEE   Anterior Chamber Deep and clear Deep and clear   Iris Round and dilated, mild patch of atrophy at 0800, no NVI Round and dilated, No NVI   Lens PC IOL in good position PC IOL in good position   Anterior Vitreous Vitreous syneresis Vitreous  syneresis         Fundus Exam       Right Left   Disc trace pallor, Sharp rim Pallor, Sharp rim, Compact, mild PPP temporal   C/D Ratio 0.3 0.3   Macula Good foveal reflex, scattered IRH/DBH -- greatest temporal macula -- improved, trace cystic changes temporal mac -- slightly improved, mild focal laser changes Blunted foveal reflex, +central atrophy and pigment clumping, scattered DBH and MAs, -- improved, scattered punctate exudates - greatest superior and temporal macula -- persistent, cystic changes / edema temporal macula - persistent / slightly improved   Vessels attenuated, Tortuous attenuated, Tortuous   Periphery Attached, scattered DBH greatest posteriorly Attached, pigmented choroidal nevus superiorly with mild elevation, +drusen, no SRF or orange pigment -- unchanged from prior           IMAGING AND PROCEDURES  Imaging and Procedures for @TODAY @  OCT, Retina - OU - Both Eyes       Right Eye Quality was good. Central Foveal Thickness: 246. Progression has improved. Findings include normal foveal contour, no SRF, intraretinal hyper-reflective material, intraretinal fluid, outer retinal atrophy, vitreomacular adhesion (Mild Interval improvement in cystic changes temporal and superior macula ).   Left Eye Quality was good. Central Foveal Thickness: 211. Progression has been stable. Findings include normal foveal contour, no SRF, intraretinal hyper-reflective material, intraretinal fluid, outer retinal atrophy (Persistent IRF/IRHM greatest ST mac, persistent central ORA; Sub-RPE hyper-reflective mass consistent with choroidal nevus -- superior to disc, caught on widefield -- stable from prior).   Notes *Images captured and stored on drive  Diagnosis / Impression: +DME OU OD: mild  interval improvement in cystic changes temporal and superior macula  OS: persistent IRF/IRHM greatest ST mac, persistent central ORA; Sub-RPE hyper-reflective mass consistent with choroidal nevus  -- superior to disc, caught on widefield -- stable from prior  Clinical management:  See below  Abbreviations: NFP - Normal foveal profile. CME - cystoid macular edema. PED - pigment epithelial detachment. IRF - intraretinal fluid. SRF - subretinal fluid. EZ - ellipsoid zone. ERM - epiretinal membrane. ORA - outer retinal atrophy. ORT - outer retinal tubulation. SRHM - subretinal hyper-reflective material      Intravitreal Injection, Pharmacologic Agent - OS - Left Eye       Time Out 07/06/2022. 3:32 PM. Confirmed correct patient, procedure, site, and patient consented.   Anesthesia Topical anesthesia was used. Anesthetic medications included Lidocaine 2%, Proparacaine 0.5%.   Procedure Preparation included 5% betadine to ocular surface, eyelid speculum. A (32g) needle was used.   Injection: 6 mg faricimab-svoa 6 MG/0.05ML   Route: Intravitreal, Site: Left Eye   NDC: O8010301, Lot: [id number], Expiration date: 05/01/2024, Waste: 0 mL   Post-op Post injection exam found visual acuity of at least counting fingers. The patient tolerated the procedure well. There were no complications. The patient received written and verbal post procedure care education. Post injection medications were not given.            ASSESSMENT/PLAN:    ICD-10-CM   1. Severe nonproliferative diabetic retinopathy of both eyes with macular edema associated with type 2 diabetes mellitus (HCC)  E11.3413 OCT, Retina - OU - Both Eyes    Intravitreal Injection, Pharmacologic Agent - OS - Left Eye    faricimab-svoa (VABYSMO) 6mg /0.50mL intravitreal injection    2. Current use of insulin (HCC)  Z79.4     3. Long term (current) use of oral hypoglycemic drugs  Z79.84     4. 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 Essential hypertension  I10     6. Hypertensive retinopathy of both eyes  H35.033     7. Pseudophakia of both eyes  Z96.1     8. Ocular hypertension of right eye  H40.051     9. Diplopia   H53.2       1-3. Severe nonproliferative diabetic retinopathy with DME, OU (OS>OD)  - s/p IVA OD #1 (06.26.20), #2 (08.14.20), #3 (09.17.20), #4 (10.16.20), #5 (02.02.22)  - s/p IVA OS #1 (07.17.20), #2 (08.14.20), #3 (09.17.20)  **IVA RESISTANCE OU**  - s/p focal laser OD (07.18.23) - s/p IVE OD #1 (11.13.20), #2 (12.11.20), #3 (01.18.21), #4 (02.19.21), #5 (03.19.21), #6 (04.16.21), #7 (05.14.21), #8 (06.17.21), #9 (07.16.21), #10 (09.21.21), #11 (10.29.21), #12 (11.29.21), #13 (12.29.21), #14 (03.09.22), #15 (04.06.22), #16 (05.11.12), #17 (06.15.22), #18 (7.20.22), #19 (08.24.22), #20 (10.04.22), #21 (11.08.22), #22 (12.13.22), #23 (02.21.23), #24 (03.28.23), #25 (05.09.23), #26 (06.20.23), #27 (08.01.23) - s/p IVE OS #1 (10.16.20) - sample, #2 (11.13.20), #3 (12.11.20), #4 (01.18.21), #5 (02.19.21), #6 (03.19.21), #7 (04.16.21), #8 (05.14.21), #9 (06.17.21), #10 (7.16.21), #11 (09.21.21), #12 (10.29.21), #13 (11.29.21), #14 (12.29.21), #15 (02.02.22, sample), #16 (03.09.22), #17 (04.06.22), #18 (05.11.2012), #19 (06.15.22), #20 (7.20.22), #21 (08.24.22), #22 (10.4.22), #23 (11.8.22), #24 (12.13.22), #25 (01.17.23), #26 (02.21.23), #27 (03.28.23). #28 (05.09.23), #29 (06.20.23), #30 (08.01.23), #31 (09.12.23), #32 (10.24.23), #33 (12.05.23) -- IVE resistance - s/p IVV OS #1 (01.16.24), #2 (02.20.24), #3 (03.26.24), #4 (04.30.24)  - FA (06.26.20) shows Severe NPDR OU w/ Late leaking MA OU, Enlarged FAZ OU, No NV OU, No significant hyperfluorescence of  disc  - BCVA OD 20/25 -- decreased; OS 20/40 -- stable  - OCT shows OD: mild interval improvement in cystic changes temporal and superior macula; OS: mild interval improvement in IRF/IRHM greatest ST mac, persistent central ORA; Sub-RPE hyper-reflective mass consistent with choroidal nevus -- superior to disc, caught on widefield -- stable from prior at 5 weeks  - recommend IVV OS #5 today, 06.04.24 w/ f/u at 5 wks  - will hold injection OD again  today   - pt in agreement  - RBA of procedure discussed, questions answered  - informed consent obtained  - see procedure note  - Eylea informed consent form re-signed and scanned on 05.09.23  - Vabysmo informed consent form signed and scanned, 01.16.24 (OU)  - [email or web address] Benefits Investigation initiated 10.16.2020 -- approved for 2024  - Vabysmo auth approved for 2024  - f/u 5 weeks, DFE, OCT, possible injections  4. Choroidal Nevus OS  - located at 1200, mild elevation, +drusen, no SRF  - baseline optos pictures obtained, 06.26.20  - discussed possible referral to oncology at [facility] in [place]  5,6. Hypertensive retinopathy OU  - discussed importance of tight BP control  - monitor  7. Pseudophakia OU  - s/p CE/IOL OU (OD on 06.03.20 and OS 06.10.20 by Dr. Nile Riggs)  - beautiful surgeries w/ IOLs in excellent position  - macular edema limiting vision as above  - monitor  8. Ocular hypertension   - IOP 14,17  - cont Cosopt qd OU  - monitor  9. Intermittent diplopia  - pt reports intermittent episodes of diplopia with both vertical and horizontal components; no rotational  - pt had appt with Dr. Ernesto Rutherford on 11.9.23 -- noted some improvement with prism but did not recommend getting the prism at this time  - pt feels diplopia is improved -- episodes less frequent   Ophthalmic Meds Ordered this visit:  Meds ordered this encounter  Medications   faricimab-svoa (VABYSMO) 6mg /0.13mL intravitreal injection     Return in about 5 weeks (around 08/10/2022) for +DME, Dilated Exam, OCT, Possible Injxn.  There are no Patient Instructions on file for this visit.  This document serves as a record of services personally performed by Karie Chimera, MD, PhD. It was created on their behalf by Gerilyn Nestle, COT an ophthalmic technician. The creation of this record is the provider's dictation and/or activities during the visit.    Electronically signed by:  Gerilyn Nestle,  COT  05.21.24 9:38 PM   This document serves as a record of services personally performed by Karie Chimera, MD, PhD. It was created on their behalf by Glee Arvin. Manson Passey, OA an ophthalmic technician. The creation of this record is the provider's dictation and/or activities during the visit.    Electronically signed by: Glee Arvin. Kristopher Oppenheim 06.04.2024 9:38 PM   Karie Chimera, M.D., Ph.D. Diseases & Surgery of the Retina and Vitreous [facility]  I have reviewed the above documentation for accuracy and completeness, and I agree with the above. Karie Chimera, M.D., Ph.D. 07/06/22 9:40 PM   Abbreviations: M myopia (nearsighted); A astigmatism; H hyperopia (farsighted); P presbyopia; Mrx spectacle prescription;  CTL contact lenses; OD right eye; OS left eye; OU both eyes  XT exotropia; ET esotropia; PEK punctate epithelial keratitis; PEE punctate epithelial erosions; DES dry eye syndrome; MGD meibomian gland dysfunction; ATs artificial tears; PFAT's preservative free artificial tears; NSC nuclear sclerotic cataract; PSC posterior subcapsular cataract;  ERM epi-retinal membrane; PVD posterior vitreous detachment; RD retinal detachment; DM diabetes mellitus; DR diabetic retinopathy; NPDR non-proliferative diabetic retinopathy; PDR proliferative diabetic retinopathy; CSME clinically significant macular edema; DME diabetic macular edema; dbh dot blot hemorrhages; CWS cotton wool spot; POAG primary open angle glaucoma; C/D cup-to-disc ratio; HVF humphrey visual field; GVF goldmann visual field; OCT optical coherence tomography; IOP intraocular pressure; BRVO Branch retinal vein occlusion; CRVO central retinal vein occlusion; CRAO central retinal artery occlusion; BRAO branch retinal artery occlusion; RT retinal tear; SB scleral buckle; PPV pars plana vitrectomy; VH Vitreous hemorrhage; PRP panretinal laser photocoagulation; IVK intravitreal kenalog; VMT vitreomacular traction; MH Macular  hole;  NVD neovascularization of the disc; NVE neovascularization elsewhere; AREDS age related eye disease study; ARMD age related macular degeneration; POAG primary open angle glaucoma; EBMD epithelial/anterior basement membrane dystrophy; ACIOL anterior chamber intraocular lens; IOL intraocular lens; PCIOL posterior chamber intraocular lens; Phaco/IOL phacoemulsification with intraocular lens placement; PRK photorefractive keratectomy; LASIK laser assisted in situ keratomileusis; HTN hypertension; DM diabetes mellitus; COPD chronic obstructive pulmonary disease

## 2022-07-06 ENCOUNTER — Ambulatory Visit (INDEPENDENT_AMBULATORY_CARE_PROVIDER_SITE_OTHER): Payer: BC Managed Care – PPO | Admitting: Ophthalmology

## 2022-07-06 ENCOUNTER — Encounter (INDEPENDENT_AMBULATORY_CARE_PROVIDER_SITE_OTHER): Payer: Self-pay | Admitting: Ophthalmology

## 2022-07-06 DIAGNOSIS — E113413 Type 2 diabetes mellitus with severe nonproliferative diabetic retinopathy with macular edema, bilateral: Secondary | ICD-10-CM

## 2022-07-06 DIAGNOSIS — Z7984 Long term (current) use of oral hypoglycemic drugs: Secondary | ICD-10-CM | POA: Diagnosis not present

## 2022-07-06 DIAGNOSIS — H40051 Ocular hypertension, right eye: Secondary | ICD-10-CM

## 2022-07-06 DIAGNOSIS — H532 Diplopia: Secondary | ICD-10-CM

## 2022-07-06 DIAGNOSIS — H35033 Hypertensive retinopathy, bilateral: Secondary | ICD-10-CM

## 2022-07-06 DIAGNOSIS — I10 Essential (primary) hypertension: Secondary | ICD-10-CM

## 2022-07-06 DIAGNOSIS — D3132 Benign neoplasm of left choroid: Secondary | ICD-10-CM

## 2022-07-06 DIAGNOSIS — Z961 Presence of intraocular lens: Secondary | ICD-10-CM

## 2022-07-06 DIAGNOSIS — Z794 Long term (current) use of insulin: Secondary | ICD-10-CM

## 2022-07-06 MED ORDER — FARICIMAB-SVOA 6 MG/0.05ML IZ SOLN
6.0000 mg | INTRAVITREAL | Status: AC | PRN
  Administered 2022-07-06: 6 mg via INTRAVITREAL

## 2022-07-09 DIAGNOSIS — M25562 Pain in left knee: Secondary | ICD-10-CM | POA: Diagnosis not present

## 2022-07-27 NOTE — Progress Notes (Signed)
[facility] - Clinic Note  08/10/2022     CHIEF COMPLAINT Patient presents for Retina Follow Up   HISTORY OF PRESENT ILLNESS: Juan [name] is a 62 y.o. male who presents to the clinic today for:   HPI     Retina Follow Up   Patient presents with  Diabetic Retinopathy.  In both eyes.  This started 5 weeks ago.  Duration of 5 weeks.  Since onset it is stable.  I, the attending physician,  performed the HPI with the patient and updated documentation appropriately.        Comments   5 week retina follow up NPDR and IVV OS pt is reporting no vision changes noticed he has noticed some floaters but denies any flashes of light       Last edited by Rennis Chris, MD on 08/10/2022  9:14 PM.    Pt states he saw some floaters in his right eye on friday   Referring physician: Laurann Montana, MD [phone] [address] [place],  [place] [address]  HISTORICAL INFORMATION:   Selected notes from the MEDICAL RECORD NUMBER Referred by Dr. Jethro Bolus for concern of CME s/p cataract sx   CURRENT MEDICATIONS: Current Outpatient Medications (Ophthalmic Drugs)  Medication Sig   dorzolamide-timolol (COSOPT) 22.3-6.8 MG/ML ophthalmic solution Place 1 drop into the right eye 2 (two) times daily. (Patient taking differently: Place 1 drop into both eyes 2 (two) times daily.)   No current facility-administered medications for this visit. (Ophthalmic Drugs)   Current Outpatient Medications (Other)  Medication Sig   ADVAIR DISKUS 500-50 MCG/DOSE AEPB Inhale 1 puff into the lungs 2 (two) times daily.   albuterol (VENTOLIN HFA) 108 (90 Base) MCG/ACT inhaler Inhale 1-2 puffs into the lungs every 6 (six) hours as needed for wheezing or shortness of breath.   alfuzosin (UROXATRAL) 10 MG 24 hr tablet TAKE 1 TABLET BY MOUTH EVERYDAY AT BEDTIME   aspirin EC 81 MG tablet Take 81 mg by mouth daily.   atorvastatin (LIPITOR) 40 MG tablet Take 1 tablet (40 mg total) by mouth daily.    carvedilol (COREG) 12.5 MG tablet Take 1 tablet (12.5 mg total) by mouth 2 (two) times daily with a meal.   Empagliflozin-metFORMIN HCl (SYNJARDY) 12.06-998 MG TABS Take 1 tablet by mouth 2 (two) times a day.    esomeprazole (NEXIUM) 40 MG capsule Take 40 mg by mouth daily at 12 noon.   famotidine (PEPCID) 40 MG tablet Take by mouth.   insulin glargine, 2 Unit Dial, (TOUJEO MAX SOLOSTAR) 300 UNIT/ML Solostar Pen Inject 40 Units into the skin daily.    iron polysaccharides (NIFEREX) 150 MG capsule Take 150 mg by mouth daily.   isosorbide mononitrate (IMDUR) 30 MG 24 hr tablet Take 1 tablet (30 mg total) by mouth daily.   lisinopril (ZESTRIL) 40 MG tablet Take 1 tablet (40 mg total) by mouth daily.   nitroGLYCERIN (NITROSTAT) 0.4 MG SL tablet Place 0.4 mg under the tongue daily.   OZEMPIC, 0.25 OR 0.5 MG/DOSE, 2 MG/3ML SOPN Inject 0.25 mg into the skin once a week.   No current facility-administered medications for this visit. (Other)   REVIEW OF SYSTEMS: ROS   Positive for: Gastrointestinal, Musculoskeletal, Endocrine, Eyes, Respiratory Negative for: Constitutional, Neurological, Skin, Genitourinary, HENT, Cardiovascular, Psychiatric, Allergic/Imm, Heme/Lymph Last edited by [name], COT on 08/10/2022 12:57 PM.         ALLERGIES Allergies  Allergen Reactions   Naproxen  Shortness Of Breath   Naproxen Sodium Shortness Of Breath   Tiotropium Bromide Monohydrate Shortness Of Breath   Penicillins Rash    Has patient had a PCN reaction causing immediate rash, facial/tongue/throat swelling, SOB or lightheadedness with hypotension: Yes Has patient had a PCN reaction causing severe rash involving mucus membranes or skin necrosis: No Has patient had a PCN reaction that required hospitalization No Has patient had a PCN reaction occurring within the last 10 years: No If all of the above answers are "NO", then may proceed with Cephalosporin use.  Has patient had a PCN reaction  causing immediate rash, facial/tongue/throat swelling, SOB or lightheadedness with hypotension: Yes Has patient had a PCN reaction causing severe rash involving mucus membranes or skin necrosis: No Has patient had a PCN reaction that required hospitalization No Has patient had a PCN reaction occurring within the last 10 years: No If all of the above answers are "NO", then may proceed with Cephalosporin use.    PAST MEDICAL HISTORY Past Medical History:  Diagnosis Date   Arthritis    Asthma    COPD (chronic obstructive pulmonary disease) (HCC)    Diabetes mellitus    Type 2   Diabetic retinopathy (HCC)    NPDR OU   GERD (gastroesophageal reflux disease)    H/O hiatal hernia    History of kidney stones    Hyperlipemia    Hypertension    Hypertensive retinopathy    OU   Nasal polyps    Pneumonia 2014   Shortness of breath    Sleep apnea    pt has CPAP but is unable to use it d/t having polyps in his nose. Pt stated "I need to call and get a CPAP mask instead"   Stomach ulcer    Past Surgical History:  Procedure Laterality Date   CATARACT EXTRACTION Right 07/05/2018   Dr. Nile Riggs   CATARACT EXTRACTION Left 07/12/2018   Dr. Nile Riggs   CHOLECYSTECTOMY N/A 01/23/2015   Procedure: LAPAROSCOPIC CHOLECYSTECTOMY ;  Surgeon: Violeta Gelinas, MD;  Location[facility] OR;  Service: General;  Laterality: N/A;   COLONOSCOPY W/ POLYPECTOMY     ESOPHAGOGASTRODUODENOSCOPY     EYE SURGERY     KNEE ARTHROSCOPY  03/24/2011   Procedure: ARTHROSCOPY KNEE;  Surgeon: Harvie Junior, MD;  Location: [facility];  Service: Orthopedics;  Laterality: Right;  partial medial menisectomy, chondroplaty patella, and medial medial plica   LEFT HEART CATH AND CORONARY ANGIOGRAPHY N/A 06/29/2019   Procedure: LEFT HEART CATH AND CORONARY ANGIOGRAPHY;  Surgeon: Iran Ouch, MD;  Location: [facility] INVASIVE [facility];  Service: Cardiovascular;  Laterality: N/A;   FAMILY HISTORY Family History  Problem Relation  Age of Onset   Diabetes Father    Coronary artery disease Father    SOCIAL HISTORY Social History   Tobacco Use   Smoking status: Former    Packs/day: 3.00    Years: 20.00    Additional pack years: 0.00    Total pack years: 60.00    Types: Cigarettes   Smokeless tobacco: Current    Types: Chew   Tobacco comments:    1 can/daily  Vaping Use   Vaping Use: Never used  Substance Use Topics   Alcohol use: Yes    Comment: social, 1x weekly   Drug use: No       OPHTHALMIC EXAM:  Base Eye Exam     Visual Acuity (Snellen - Linear)       Right  Left   Dist [place] 20/30 20/40 -2   Dist ph [place] 20/20 -1 NI         Tonometry (Tonopen, 1:04 PM)       Right Left   Pressure 11 14         Pupils       Pupils Dark Light Shape React APD   Right PERRL 3 2 Round Brisk None   Left PERRL 3 2 Round Brisk None         Visual Fields       Left Right    Full Full         Extraocular Movement       Right Left    Full, Ortho Full, Ortho         Neuro/Psych     Oriented x3: Yes   Mood/Affect: Normal         Dilation     Both eyes: 2.5% Phenylephrine @ 1:03 PM           Slit Lamp and Fundus Exam     Slit Lamp Exam       Right Left   Lids/Lashes Dermatochalasis - upper lid, Telangiectasia, mild Meibomian gland dysfunction Dermatochalasis - upper lid, Telangiectasia, mild Meibomian gland dysfunction   Conjunctiva/Sclera White and quiet Mild nasal Pinguecula   Cornea Mild Arcus, Well healed temporal cataract wounds Arcus, Well healed temporal cataract wounds, trace PEE   Anterior Chamber Deep and clear Deep and clear   Iris Round and dilated, mild patch of atrophy at 0800, no NVI Round and dilated, No NVI   Lens PC IOL in good position PC IOL in good position   Anterior Vitreous Vitreous syneresis Vitreous syneresis         Fundus Exam       Right Left   Disc trace pallor, Sharp rim Pallor, Sharp rim, Compact, mild PPP temporal   C/D Ratio 0.3 0.3    Macula Good foveal reflex, scattered IRH/DBH -- greatest temporal macula, trace cystic changes temporal mac, mild focal laser changes Blunted foveal reflex, +central atrophy and pigment clumping, scattered DBH and Mas -- improved, scattered punctate exudates - greatest superior and temporal macula -- persistent, cystic changes / edema temporal macula -- persistent   Vessels attenuated, Tortuous attenuated, Tortuous   Periphery Attached, scattered DBH greatest posteriorly Attached, pigmented choroidal nevus superiorly with mild elevation, +drusen, no SRF or orange pigment -- unchanged from prior           IMAGING AND PROCEDURES  Imaging and Procedures for @TODAY @  OCT, Retina - OU - Both Eyes       Right Eye Quality was good. Central Foveal Thickness: 259. Progression has been stable. Findings include normal foveal contour, no SRF, intraretinal hyper-reflective material, intraretinal fluid, outer retinal atrophy, vitreomacular adhesion (Trace persistent cystic changes temporal and superior macula -- stable).   Left Eye Quality was good. Central Foveal Thickness: 221. Progression has been stable. Findings include normal foveal contour, no SRF, intraretinal hyper-reflective material, intraretinal fluid, outer retinal atrophy (Persistent IRF/IRHM greatest ST mac, persistent central ORA; Sub-RPE hyper-reflective mass consistent with choroidal nevus -- superior to disc, caught on widefield -- stable from prior).   Notes *Images captured and stored on drive  Diagnosis / Impression: +DME OU OD: trace, persistent cystic changes temporal and superior macula -- stable OS: persistent IRF/IRHM greatest ST mac, persistent central ORA; Sub-RPE hyper-reflective mass consistent with choroidal nevus -- superior to disc, caught on widefield --  stable from prior  Clinical management:  See below  Abbreviations: NFP - Normal foveal profile. CME - cystoid macular edema. PED - pigment epithelial  detachment. IRF - intraretinal fluid. SRF - subretinal fluid. EZ - ellipsoid zone. ERM - epiretinal membrane. ORA - outer retinal atrophy. ORT - outer retinal tubulation. SRHM - subretinal hyper-reflective material      Intravitreal Injection, Pharmacologic Agent - OS - Left Eye       Time Out 08/10/2022. 1:29 PM. Confirmed correct patient, procedure, site, and patient consented.   Anesthesia Topical anesthesia was used. Anesthetic medications included Lidocaine 2%, Proparacaine 0.5%.   Procedure Preparation included 5% betadine to ocular surface, eyelid speculum. A (32g) needle was used.   Injection: 6 mg faricimab-svoa 6 MG/0.05ML   Route: Intravitreal, Site: Left Eye   NDC: O8010301, Lot: [id number], Expiration date: 07/31/2024, Waste: 0 mL   Post-op Post injection exam found visual acuity of at least counting fingers. The patient tolerated the procedure well. There were no complications. The patient received written and verbal post procedure care education. Post injection medications were not given.            ASSESSMENT/PLAN:    ICD-10-CM   1. Severe nonproliferative diabetic retinopathy of both eyes with macular edema associated with type 2 diabetes mellitus (HCC)  E11.3413 OCT, Retina - OU - Both Eyes    Intravitreal Injection, Pharmacologic Agent - OS - Left Eye    faricimab-svoa (VABYSMO) 6mg /0.53mL intravitreal injection    2. Current use of insulin (HCC)  Z79.4     3. Long term (current) use of oral hypoglycemic drugs  Z79.84     4. 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 Essential hypertension  I10     6. Hypertensive retinopathy of both eyes  H35.033     7. Pseudophakia of both eyes  Z96.1     8. Ocular hypertension of right eye  H40.051     9. Diplopia  H53.2        1-3. Severe nonproliferative diabetic retinopathy with DME, OU (OS>OD)  - s/p IVA OD #1 (06.26.20), #2 (08.14.20), #3 (09.17.20), #4 (10.16.20), #5 (02.02.22)  - s/p IVA OS #1  (07.17.20), #2 (08.14.20), #3 (09.17.20)  **IVA RESISTANCE OU**  - s/p focal laser OD (07.18.23) - s/p IVE OD #1 (11.13.20), #2 (12.11.20), #3 (01.18.21), #4 (02.19.21), #5 (03.19.21), #6 (04.16.21), #7 (05.14.21), #8 (06.17.21), #9 (07.16.21), #10 (09.21.21), #11 (10.29.21), #12 (11.29.21), #13 (12.29.21), #14 (03.09.22), #15 (04.06.22), #16 (05.11.12), #17 (06.15.22), #18 (7.20.22), #19 (08.24.22), #20 (10.04.22), #21 (11.08.22), #22 (12.13.22), #23 (02.21.23), #24 (03.28.23), #25 (05.09.23), #26 (06.20.23), #27 (08.01.23) - s/p IVE OS #1 (10.16.20) - sample, #2 (11.13.20), #3 (12.11.20), #4 (01.18.21), #5 (02.19.21), #6 (03.19.21), #7 (04.16.21), #8 (05.14.21), #9 (06.17.21), #10 (7.16.21), #11 (09.21.21), #12 (10.29.21), #13 (11.29.21), #14 (12.29.21), #15 (02.02.22, sample), #16 (03.09.22), #17 (04.06.22), #18 (05.11.2012), #19 (06.15.22), #20 (7.20.22), #21 (08.24.22), #22 (10.4.22), #23 (11.8.22), #24 (12.13.22), #25 (01.17.23), #26 (02.21.23), #27 (03.28.23). #28 (05.09.23), #29 (06.20.23), #30 (08.01.23), #31 (09.12.23), #32 (10.24.23), #33 (12.05.23) -- IVE resistance - s/p IVV OS #1 (01.16.24), #2 (02.20.24), #3 (03.26.24), #4 (04.30.24), #5 (06.04.24)  - FA (06.26.20) shows Severe NPDR OU w/ Late leaking MA OU, Enlarged FAZ OU, No NV OU, No significant hyperfluorescence of disc  - BCVA OD 20/20 from 20/25; OS 20/40 -- stable  - OCT shows OD: trace persistent cystic changes temporal and superior macula -- stable; OS: persistent IRF/IRHM greatest  ST mac, persistent central ORA; Sub-RPE hyper-reflective mass consistent with choroidal nevus -- superior to disc, caught on widefield -- stable from prior at 5 weeks  - recommend IVV OS #6 today, 07.09.24 w/ f/u at 5 wks  - will hold injection OD again today   - pt in agreement  - RBA of procedure discussed, questions answered  - see procedure note  - Vabysmo informed consent form signed and scanned, 01.16.24 (OU)  - [email or web address] Benefits Investigation  initiated 10.16.2020 -- approved for 2024  - Vabysmo auth approved for 2024  - f/u 5 weeks, DFE, OCT, possible injections  4. Choroidal Nevus OS  - located at 1200, mild elevation, +drusen, no SRF  - baseline optos pictures obtained, 06.26.20  - discussed possible referral to oncology at [facility] in [place]  5,6. Hypertensive retinopathy OU  - discussed importance of tight BP control  - monitor  7. Pseudophakia OU  - s/p CE/IOL OU (OD on 06.03.20 and OS 06.10.20 by Dr. Nile Riggs)  - beautiful surgeries w/ IOLs in excellent position  - macular edema limiting vision as above  - monitor  8. Ocular hypertension   - IOP 14,17  - cont Cosopt qd OU  - monitor  9. Intermittent diplopia  - pt reports intermittent episodes of diplopia with both vertical and horizontal components; no rotational  - pt had appt with Dr. Ernesto Rutherford on 11.9.23 -- noted some improvement with prism but did not recommend getting the prism at this time  - pt feels diplopia is improved -- episodes less frequent   Ophthalmic Meds Ordered this visit:  Meds ordered this encounter  Medications   faricimab-svoa (VABYSMO) 6mg /0.82mL intravitreal injection     Return in about 5 weeks (around 09/14/2022) for f/u NPDR OU, DFE, OCT.  There are no Patient Instructions on file for this visit.  This document serves as a record of services personally performed by Karie Chimera, MD, PhD. It was created on their behalf by Gerilyn Nestle, COT an ophthalmic technician. The creation of this record is the provider's dictation and/or activities during the visit.    Electronically signed by:  Charlette Caffey, COT  08/10/22 9:16 PM  This document serves as a record of services personally performed by Karie Chimera, MD, PhD. It was created on their behalf by Glee Arvin. Manson Passey, OA an ophthalmic technician. The creation of this record is the provider's dictation and/or activities during the visit.    Electronically  signed by: Glee Arvin. Manson Passey, OA 08/10/22 9:16 PM  Karie Chimera, M.D., Ph.D. Diseases & Surgery of the Retina and Vitreous [facility]  I have reviewed the above documentation for accuracy and completeness, and I agree with the above. Karie Chimera, M.D., Ph.D. 08/10/22 9:21 PM   Abbreviations: M myopia (nearsighted); A astigmatism; H hyperopia (farsighted); P presbyopia; Mrx spectacle prescription;  CTL contact lenses; OD right eye; OS left eye; OU both eyes  XT exotropia; ET esotropia; PEK punctate epithelial keratitis; PEE punctate epithelial erosions; DES dry eye syndrome; MGD meibomian gland dysfunction; ATs artificial tears; PFAT's preservative free artificial tears; NSC nuclear sclerotic cataract; PSC posterior subcapsular cataract; ERM epi-retinal membrane; PVD posterior vitreous detachment; RD retinal detachment; DM diabetes mellitus; DR diabetic retinopathy; NPDR non-proliferative diabetic retinopathy; PDR proliferative diabetic retinopathy; CSME clinically significant macular edema; DME diabetic macular edema; dbh dot blot hemorrhages; CWS cotton wool spot; POAG primary open angle glaucoma; C/D cup-to-disc ratio; HVF humphrey visual  field; GVF goldmann visual field; OCT optical coherence tomography; IOP intraocular pressure; BRVO Branch retinal vein occlusion; CRVO central retinal vein occlusion; CRAO central retinal artery occlusion; BRAO branch retinal artery occlusion; RT retinal tear; SB scleral buckle; PPV pars plana vitrectomy; VH Vitreous hemorrhage; PRP panretinal laser photocoagulation; IVK intravitreal kenalog; VMT vitreomacular traction; MH Macular hole;  NVD neovascularization of the disc; NVE neovascularization elsewhere; AREDS age related eye disease study; ARMD age related macular degeneration; POAG primary open angle glaucoma; EBMD epithelial/anterior basement membrane dystrophy; ACIOL anterior chamber intraocular lens; IOL intraocular lens; PCIOL  posterior chamber intraocular lens; Phaco/IOL phacoemulsification with intraocular lens placement; PRK photorefractive keratectomy; LASIK laser assisted in situ keratomileusis; HTN hypertension; DM diabetes mellitus; COPD chronic obstructive pulmonary disease

## 2022-08-10 ENCOUNTER — Ambulatory Visit (INDEPENDENT_AMBULATORY_CARE_PROVIDER_SITE_OTHER): Payer: BC Managed Care – PPO | Admitting: Ophthalmology

## 2022-08-10 ENCOUNTER — Encounter (INDEPENDENT_AMBULATORY_CARE_PROVIDER_SITE_OTHER): Payer: Self-pay | Admitting: Ophthalmology

## 2022-08-10 DIAGNOSIS — Z794 Long term (current) use of insulin: Secondary | ICD-10-CM

## 2022-08-10 DIAGNOSIS — I10 Essential (primary) hypertension: Secondary | ICD-10-CM

## 2022-08-10 DIAGNOSIS — H40051 Ocular hypertension, right eye: Secondary | ICD-10-CM

## 2022-08-10 DIAGNOSIS — Z7984 Long term (current) use of oral hypoglycemic drugs: Secondary | ICD-10-CM

## 2022-08-10 DIAGNOSIS — D3132 Benign neoplasm of left choroid: Secondary | ICD-10-CM | POA: Diagnosis not present

## 2022-08-10 DIAGNOSIS — H532 Diplopia: Secondary | ICD-10-CM

## 2022-08-10 DIAGNOSIS — E113413 Type 2 diabetes mellitus with severe nonproliferative diabetic retinopathy with macular edema, bilateral: Secondary | ICD-10-CM

## 2022-08-10 DIAGNOSIS — H35033 Hypertensive retinopathy, bilateral: Secondary | ICD-10-CM

## 2022-08-10 DIAGNOSIS — Z961 Presence of intraocular lens: Secondary | ICD-10-CM

## 2022-08-10 MED ORDER — FARICIMAB-SVOA 6 MG/0.05ML IZ SOLN
6.0000 mg | INTRAVITREAL | Status: AC | PRN
Start: 2022-08-10 — End: 2022-08-10
  Administered 2022-08-10: 6 mg via INTRAVITREAL

## 2022-08-12 ENCOUNTER — Other Ambulatory Visit: Payer: Self-pay | Admitting: Internal Medicine

## 2022-08-27 DIAGNOSIS — E1140 Type 2 diabetes mellitus with diabetic neuropathy, unspecified: Secondary | ICD-10-CM | POA: Diagnosis not present

## 2022-08-27 DIAGNOSIS — Z794 Long term (current) use of insulin: Secondary | ICD-10-CM | POA: Diagnosis not present

## 2022-08-27 DIAGNOSIS — I152 Hypertension secondary to endocrine disorders: Secondary | ICD-10-CM | POA: Diagnosis not present

## 2022-08-27 DIAGNOSIS — E1159 Type 2 diabetes mellitus with other circulatory complications: Secondary | ICD-10-CM | POA: Diagnosis not present

## 2022-09-13 NOTE — Progress Notes (Signed)
[facility] - Clinic Note  09/15/2022     CHIEF COMPLAINT Patient presents for Retina Follow Up   HISTORY OF PRESENT ILLNESS: Juan [name] is a 62 y.o. male who presents to the clinic today for:   HPI     Retina Follow Up   Patient presents with  Diabetic Retinopathy.  In both eyes.  This started 5 weeks ago.  I, the attending physician,  performed the HPI with the patient and updated documentation appropriately.        Comments   Patient here for 5 weeks retina follow up for NPDR OU. Patient states vision about the same. No eye pain.       Last edited by Rennis Chris, MD on 09/15/2022  5:03 PM.     Patient feels that the vision is doing well.   Referring physician: Laurann Montana, MD [id number] [address] [place],  [place] [address]  HISTORICAL INFORMATION:   Selected notes from the MEDICAL RECORD NUMBER Referred by Dr. Jethro Bolus for concern of CME s/p cataract sx   CURRENT MEDICATIONS: Current Outpatient Medications (Ophthalmic Drugs)  Medication Sig   dorzolamide-timolol (COSOPT) 22.3-6.8 MG/ML ophthalmic solution Place 1 drop into the right eye 2 (two) times daily. (Patient taking differently: Place 1 drop into both eyes 2 (two) times daily.)   No current facility-administered medications for this visit. (Ophthalmic Drugs)   Current Outpatient Medications (Other)  Medication Sig   ADVAIR DISKUS 500-50 MCG/DOSE AEPB Inhale 1 puff into the lungs 2 (two) times daily.   albuterol (VENTOLIN HFA) 108 (90 Base) MCG/ACT inhaler Inhale 1-2 puffs into the lungs every 6 (six) hours as needed for wheezing or shortness of breath.   alfuzosin (UROXATRAL) 10 MG 24 hr tablet TAKE 1 TABLET BY MOUTH EVERYDAY AT BEDTIME   aspirin EC 81 MG tablet Take 81 mg by mouth daily.   atorvastatin (LIPITOR) 40 MG tablet Take 1 tablet (40 mg total) by mouth daily.   carvedilol (COREG) 12.5 MG tablet Take 1 tablet (12.5 mg total) by mouth 2 (two) times daily  with a meal.   Empagliflozin-metFORMIN HCl (SYNJARDY) 12.06-998 MG TABS Take 1 tablet by mouth 2 (two) times a day.    esomeprazole (NEXIUM) 40 MG capsule Take 40 mg by mouth daily at 12 noon.   famotidine (PEPCID) 40 MG tablet Take by mouth.   insulin glargine, 2 Unit Dial, (TOUJEO MAX SOLOSTAR) 300 UNIT/ML Solostar Pen Inject 40 Units into the skin daily.    iron polysaccharides (NIFEREX) 150 MG capsule Take 150 mg by mouth daily.   isosorbide mononitrate (IMDUR) 30 MG 24 hr tablet Take 1 tablet (30 mg total) by mouth daily.   lisinopril (ZESTRIL) 40 MG tablet Take 1 tablet (40 mg total) by mouth daily.   nitroGLYCERIN (NITROSTAT) 0.4 MG SL tablet Place 0.4 mg under the tongue daily.   OZEMPIC, 0.25 OR 0.5 MG/DOSE, 2 MG/3ML SOPN Inject 0.25 mg into the skin once a week.   No current facility-administered medications for this visit. (Other)   REVIEW OF SYSTEMS: ROS   Positive for: Gastrointestinal, Musculoskeletal, Endocrine, Eyes, Respiratory Negative for: Constitutional, Neurological, Skin, Genitourinary, HENT, Cardiovascular, Psychiatric, Allergic/Imm, Heme/Lymph Last edited by [name], COA on 09/15/2022  2:16 PM.     ALLERGIES Allergies  Allergen Reactions   Naproxen Shortness Of Breath   Naproxen Sodium Shortness Of Breath   Tiotropium Bromide Monohydrate Shortness Of Breath   Penicillins Rash  Has patient had a PCN reaction causing immediate rash, facial/tongue/throat swelling, SOB or lightheadedness with hypotension: Yes Has patient had a PCN reaction causing severe rash involving mucus membranes or skin necrosis: No Has patient had a PCN reaction that required hospitalization No Has patient had a PCN reaction occurring within the last 10 years: No If all of the above answers are "NO", then may proceed with Cephalosporin use.  Has patient had a PCN reaction causing immediate rash, facial/tongue/throat swelling, SOB or lightheadedness with hypotension: Yes Has  patient had a PCN reaction causing severe rash involving mucus membranes or skin necrosis: No Has patient had a PCN reaction that required hospitalization No Has patient had a PCN reaction occurring within the last 10 years: No If all of the above answers are "NO", then may proceed with Cephalosporin use.    PAST MEDICAL HISTORY Past Medical History:  Diagnosis Date   Arthritis    Asthma    COPD (chronic obstructive pulmonary disease) (HCC)    Diabetes mellitus    Type 2   Diabetic retinopathy (HCC)    NPDR OU   GERD (gastroesophageal reflux disease)    H/O hiatal hernia    History of kidney stones    Hyperlipemia    Hypertension    Hypertensive retinopathy    OU   Nasal polyps    Pneumonia 2014   Shortness of breath    Sleep apnea    pt has CPAP but is unable to use it d/t having polyps in his nose. Pt stated "I need to call and get a CPAP mask instead"   Stomach ulcer    Past Surgical History:  Procedure Laterality Date   CATARACT EXTRACTION Right 07/05/2018   Dr. Nile Riggs   CATARACT EXTRACTION Left 07/12/2018   Dr. Nile Riggs   CHOLECYSTECTOMY N/A 01/23/2015   Procedure: LAPAROSCOPIC CHOLECYSTECTOMY ;  Surgeon: Violeta Gelinas, MD;  Location: [facility] OR;  Service: General;  Laterality: N/A;   COLONOSCOPY W/ POLYPECTOMY     ESOPHAGOGASTRODUODENOSCOPY     EYE SURGERY     KNEE ARTHROSCOPY  03/24/2011   Procedure: ARTHROSCOPY KNEE;  Surgeon: Harvie Junior, MD;  Location: [facility];  Service: Orthopedics;  Laterality: Right;  partial medial menisectomy, chondroplaty patella, and medial medial plica   LEFT HEART CATH AND CORONARY ANGIOGRAPHY N/A 06/29/2019   Procedure: LEFT HEART CATH AND CORONARY ANGIOGRAPHY;  Surgeon: Iran Ouch, MD;  Location: [facility];  Service: Cardiovascular;  Laterality: N/A;   FAMILY HISTORY Family History  Problem Relation Age of Onset   Diabetes Father    Coronary artery disease Father    SOCIAL HISTORY Social History    Tobacco Use   Smoking status: Former    Current packs/day: 3.00    Average packs/day: 3.0 packs/day for 20.0 years (60.0 ttl pk-yrs)    Types: Cigarettes   Smokeless tobacco: Current    Types: Chew   Tobacco comments:    1 can/daily  Vaping Use   Vaping status: Never Used  Substance Use Topics   Alcohol use: Yes    Comment: social, 1x weekly   Drug use: No       OPHTHALMIC EXAM:  Base Eye Exam     Visual Acuity (Snellen - Linear)       Right Left   Dist [place] 20/25 -2 20/50 +1   Dist ph [place] 20/20 20/40 -2         Tonometry (Tonopen, 2:14 PM)  Right Left   Pressure 20 20         Pupils       Dark Light Shape React APD   Right 3 2 Round Brisk None   Left 3 2 Round Brisk None         Visual Fields (Counting fingers)       Left Right    Full Full         Extraocular Movement       Right Left    Full, Ortho Full, Ortho         Neuro/Psych     Oriented x3: Yes   Mood/Affect: Normal         Dilation     Both eyes: 1.0% Mydriacyl, 2.5% Phenylephrine @ 2:14 PM           Slit Lamp and Fundus Exam     Slit Lamp Exam       Right Left   Lids/Lashes Dermatochalasis - upper lid, Telangiectasia, mild Meibomian gland dysfunction Dermatochalasis - upper lid, Telangiectasia, mild Meibomian gland dysfunction   Conjunctiva/Sclera White and quiet Mild nasal Pinguecula   Cornea Mild Arcus, Well healed temporal cataract wounds Arcus, Well healed temporal cataract wounds, trace PEE   Anterior Chamber Deep and clear Deep and clear   Iris Round and dilated, mild patch of atrophy at 0800, no NVI Round and dilated, No NVI   Lens PC IOL in good position PC IOL in good position   Anterior Vitreous Vitreous syneresis Vitreous syneresis         Fundus Exam       Right Left   Disc trace pallor, Sharp rim Pallor, Sharp rim, Compact, mild PPP temporal   C/D Ratio 0.3 0.3   Macula Good foveal reflex, scattered IRH/DBH -- greatest temporal macula,  trace cystic changes temporal mac, mild focal laser changes Blunted foveal reflex, +central atrophy and pigment clumping, scattered DBH and Mas -- improved, scattered punctate exudates - greatest superior and temporal macula -- persistent, cystic changes / edema temporal macula -- persistent-- slightly improved   Vessels attenuated, Tortuous attenuated, Tortuous   Periphery Attached, scattered DBH greatest posteriorly Attached, pigmented choroidal nevus superiorly with mild elevation, +drusen, no SRF or orange pigment -- unchanged from prior           IMAGING AND PROCEDURES  Imaging and Procedures for @TODAY @  OCT, Retina - OU - Both Eyes       Right Eye Quality was good. Central Foveal Thickness: 257. Progression has been stable. Findings include normal foveal contour, no SRF, intraretinal hyper-reflective material, intraretinal fluid, outer retinal atrophy, vitreomacular adhesion (Trace persistent cystic changes temporal and superior macula -- slightly improved).   Left Eye Quality was good. Central Foveal Thickness: 211. Progression has improved. Findings include normal foveal contour, no SRF, intraretinal hyper-reflective material, intraretinal fluid, outer retinal atrophy (Persistent IRF/IRHM greatest ST mac-- slightly improved, persistent central ORA; Sub-RPE hyper-reflective mass consistent with choroidal nevus -- superior to disc, caught on widefield -- stable from prior).   Notes *Images captured and stored on drive  Diagnosis / Impression: +DME OU OD: Trace persistent cystic changes temporal and superior macula -- slightly improved OS: Persistent IRF/IRHM greatest ST mac-- slightly improved, persistent central ORA; Sub-RPE hyper-reflective mass consistent with choroidal nevus -- superior to disc, caught on widefield -- stable from prior  Clinical management:  See below  Abbreviations: NFP - Normal foveal profile. CME - cystoid macular edema. PED - pigment epithelial  detachment. IRF - intraretinal fluid. SRF - subretinal fluid. EZ - ellipsoid zone. ERM - epiretinal membrane. ORA - outer retinal atrophy. ORT - outer retinal tubulation. SRHM - subretinal hyper-reflective material      Intravitreal Injection, Pharmacologic Agent - OS - Left Eye       Time Out 09/15/2022. 3:19 PM. Confirmed correct patient, procedure, site, and patient consented.   Anesthesia Topical anesthesia was used. Anesthetic medications included Lidocaine 2%, Proparacaine 0.5%.   Procedure Preparation included 5% betadine to ocular surface, eyelid speculum. A (32g) needle was used.   Injection: 6 mg faricimab-svoa 6 MG/0.05ML   Route: Intravitreal, Site: Left Eye   NDC: O8010301, Lot: [id number], Expiration date: 10/01/2024, Waste: 0 mL   Post-op Post injection exam found visual acuity of at least counting fingers. The patient tolerated the procedure well. There were no complications. The patient received written and verbal post procedure care education. Post injection medications were not given.            ASSESSMENT/PLAN:    ICD-10-CM   1. Severe nonproliferative diabetic retinopathy of both eyes with macular edema associated with type 2 diabetes mellitus (HCC)  E11.3413 OCT, Retina - OU - Both Eyes    Intravitreal Injection, Pharmacologic Agent - OS - Left Eye    faricimab-svoa (VABYSMO) 6mg /0.12mL intravitreal injection    2. Current use of insulin (HCC)  Z79.4     3. Long term (current) use of oral hypoglycemic drugs  Z79.84     4. 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 Essential hypertension  I10     6. Hypertensive retinopathy of both eyes  H35.033     7. Pseudophakia of both eyes  Z96.1     8. Ocular hypertension of right eye  H40.051      1-3. Severe nonproliferative diabetic retinopathy with DME, OU (OS>OD) - s/p IVA OD #1 (06.26.20), #2 (08.14.20), #3 (09.17.20), #4 (10.16.20), #5 (02.02.22)  - s/p IVA OS #1 (07.17.20), #2 (08.14.20), #3  (09.17.20)  **IVA RESISTANCE OU**  - s/p focal laser OD (07.18.23) - s/p IVE OD #1 (11.13.20), #2 (12.11.20), #3 (01.18.21), #4 (02.19.21), #5 (03.19.21), #6 (04.16.21), #7 (05.14.21), #8 (06.17.21), #9 (07.16.21), #10 (09.21.21), #11 (10.29.21), #12 (11.29.21), #13 (12.29.21), #14 (03.09.22), #15 (04.06.22), #16 (05.11.12), #17 (06.15.22), #18 (7.20.22), #19 (08.24.22), #20 (10.04.22), #21 (11.08.22), #22 (12.13.22), #23 (02.21.23), #24 (03.28.23), #25 (05.09.23), #26 (06.20.23), #27 (08.01.23) - s/p IVE OS #1 (10.16.20) - sample, #2 (11.13.20), #3 (12.11.20), #4 (01.18.21), #5 (02.19.21), #6 (03.19.21), #7 (04.16.21), #8 (05.14.21), #9 (06.17.21), #10 (7.16.21), #11 (09.21.21), #12 (10.29.21), #13 (11.29.21), #14 (12.29.21), #15 (02.02.22, sample), #16 (03.09.22), #17 (04.06.22), #18 (05.11.2012), #19 (06.15.22), #20 (7.20.22), #21 (08.24.22), #22 (10.4.22), #23 (11.8.22), #24 (12.13.22), #25 (01.17.23), #26 (02.21.23), #27 (03.28.23). #28 (05.09.23), #29 (06.20.23), #30 (08.01.23), #31 (09.12.23), #32 (10.24.23), #33 (12.05.23) -- IVE resistance - s/p IVV OS #1 (01.16.24), #2 (02.20.24), #3 (03.26.24), #4 (04.30.24), #5 (06.04.24), #6 (07.09.24) - FA (06.26.20) shows Severe NPDR OU w/ Late leaking MA OU, Enlarged FAZ OU, No NV OU, No significant hyperfluorescence of disc  - BCVA OD 20/20 from 20/25; OS 20/40 -- stable - OCT shows OD: trace persistent cystic changes temporal and superior macula - stable; OS: Persistent IRF/IRHM greatest ST mac-- slightly improved, persistent central ORA; Sub-RPE hyper-reflective mass consistent with choroidal nevus -- superior to disc, caught on widefield -- stable from prior-- stable from prior at 5 weeks  - recommend IVV OS #7  today, 08.14.24 w/ f/u at 5 wks  - will hold injection OD again today   - pt in agreement  - RBA of procedure discussed, questions answered  - see procedure note  - Vabysmo informed consent form signed and scanned, 01.16.24 (OU)  - [email or web address]  Benefits Investigation initiated 10.16.2020 -- approved for 2024  - Vabysmo auth approved for 2024  - f/u 5 weeks, DFE, OCT, possible injections  4. Choroidal Nevus OS  - located at 1200, mild elevation, +drusen, no SRF  - baseline optos pictures obtained, 06.26.20  - discussed possible referral to oncology at [facility] in [place]  5,6. Hypertensive retinopathy OU  - discussed importance of tight BP control  - monitor  7. Pseudophakia OU  - s/p CE/IOL OU (OD on 06.03.20 and OS 06.10.20 by Dr. Nile Riggs)  - beautiful surgeries w/ IOLs in excellent position  - macular edema limiting vision as above  - monitor  8. Ocular hypertension   - IOP 20,20 OU  - cont Cosopt qd OU  - monitor  9. Intermittent diplopia - pt reports intermittent episodes of diplopia with both vertical and horizontal components; no rotational - pt had appt with Dr. Ernesto Rutherford on 11.9.23 -- noted some improvement with prism but did not recommend getting the prism at this time  - pt feels diplopia is improved -- episodes less frequent   Ophthalmic Meds Ordered this visit:  Meds ordered this encounter  Medications   faricimab-svoa (VABYSMO) 6mg /0.33mL intravitreal injection     Return in about 5 weeks (around 10/20/2022) for f/u NPDR OU , DFE, OCT, Possible, IVV, OS.  There are no Patient Instructions on file for this visit.  This document serves as a record of services personally performed by Karie Chimera, MD, PhD. It was created on their behalf by De Blanch, an ophthalmic technician. The creation of this record is the provider's dictation and/or activities during the visit.    Electronically signed by: De Blanch, OA, 09/15/22  5:04 PM  This document serves as a record of services personally performed by Karie Chimera, MD, PhD. It was created on their behalf by Laurey Morale, COT an ophthalmic technician. The creation of this record is the provider's dictation and/or activities  during the visit.    Electronically signed by:  Charlette Caffey, COT  09/15/22 5:04 PM  Karie Chimera, M.D., Ph.D. Diseases & Surgery of the Retina and Vitreous [facility]  I have reviewed the above documentation for accuracy and completeness, and I agree with the above. Karie Chimera, M.D., Ph.D. 09/15/22 5:05 PM   Abbreviations: M myopia (nearsighted); A astigmatism; H hyperopia (farsighted); P presbyopia; Mrx spectacle prescription;  CTL contact lenses; OD right eye; OS left eye; OU both eyes  XT exotropia; ET esotropia; PEK punctate epithelial keratitis; PEE punctate epithelial erosions; DES dry eye syndrome; MGD meibomian gland dysfunction; ATs artificial tears; PFAT's preservative free artificial tears; NSC nuclear sclerotic cataract; PSC posterior subcapsular cataract; ERM epi-retinal membrane; PVD posterior vitreous detachment; RD retinal detachment; DM diabetes mellitus; DR diabetic retinopathy; NPDR non-proliferative diabetic retinopathy; PDR proliferative diabetic retinopathy; CSME clinically significant macular edema; DME diabetic macular edema; dbh dot blot hemorrhages; CWS cotton wool spot; POAG primary open angle glaucoma; C/D cup-to-disc ratio; HVF humphrey visual field; GVF goldmann visual field; OCT optical coherence tomography; IOP intraocular pressure; BRVO Branch retinal vein occlusion; CRVO central retinal vein occlusion; CRAO central retinal artery occlusion; BRAO branch retinal artery  occlusion; RT retinal tear; SB scleral buckle; PPV pars plana vitrectomy; VH Vitreous hemorrhage; PRP panretinal laser photocoagulation; IVK intravitreal kenalog; VMT vitreomacular traction; MH Macular hole;  NVD neovascularization of the disc; NVE neovascularization elsewhere; AREDS age related eye disease study; ARMD age related macular degeneration; POAG primary open angle glaucoma; EBMD epithelial/anterior basement membrane dystrophy; ACIOL anterior chamber  intraocular lens; IOL intraocular lens; PCIOL posterior chamber intraocular lens; Phaco/IOL phacoemulsification with intraocular lens placement; PRK photorefractive keratectomy; LASIK laser assisted in situ keratomileusis; HTN hypertension; DM diabetes mellitus; COPD chronic obstructive pulmonary disease

## 2022-09-15 ENCOUNTER — Ambulatory Visit (INDEPENDENT_AMBULATORY_CARE_PROVIDER_SITE_OTHER): Payer: BC Managed Care – PPO | Admitting: Ophthalmology

## 2022-09-15 ENCOUNTER — Encounter (INDEPENDENT_AMBULATORY_CARE_PROVIDER_SITE_OTHER): Payer: Self-pay | Admitting: Ophthalmology

## 2022-09-15 ENCOUNTER — Encounter (INDEPENDENT_AMBULATORY_CARE_PROVIDER_SITE_OTHER): Payer: BC Managed Care – PPO | Admitting: Ophthalmology

## 2022-09-15 DIAGNOSIS — Z961 Presence of intraocular lens: Secondary | ICD-10-CM

## 2022-09-15 DIAGNOSIS — E113413 Type 2 diabetes mellitus with severe nonproliferative diabetic retinopathy with macular edema, bilateral: Secondary | ICD-10-CM

## 2022-09-15 DIAGNOSIS — H40051 Ocular hypertension, right eye: Secondary | ICD-10-CM

## 2022-09-15 DIAGNOSIS — I10 Essential (primary) hypertension: Secondary | ICD-10-CM

## 2022-09-15 DIAGNOSIS — Z7984 Long term (current) use of oral hypoglycemic drugs: Secondary | ICD-10-CM

## 2022-09-15 DIAGNOSIS — D3132 Benign neoplasm of left choroid: Secondary | ICD-10-CM | POA: Diagnosis not present

## 2022-09-15 DIAGNOSIS — Z794 Long term (current) use of insulin: Secondary | ICD-10-CM

## 2022-09-15 DIAGNOSIS — H35033 Hypertensive retinopathy, bilateral: Secondary | ICD-10-CM

## 2022-09-15 MED ORDER — FARICIMAB-SVOA 6 MG/0.05ML IZ SOLN
6.0000 mg | INTRAVITREAL | Status: AC | PRN
Start: 2022-09-15 — End: 2022-09-15
  Administered 2022-09-15: 6 mg via INTRAVITREAL

## 2022-10-13 ENCOUNTER — Other Ambulatory Visit: Payer: Self-pay | Admitting: *Deleted

## 2022-10-13 MED ORDER — ATORVASTATIN CALCIUM 40 MG PO TABS: 40.0000 mg | ORAL_TABLET | Freq: Every day | ORAL | 2 refills | Status: DC

## 2022-10-13 MED ORDER — CARVEDILOL 12.5 MG PO TABS: 12.5000 mg | ORAL_TABLET | Freq: Two times a day (BID) | ORAL | 2 refills | Status: DC

## 2022-10-13 MED ORDER — ISOSORBIDE MONONITRATE ER 30 MG PO TB24: 30.0000 mg | ORAL_TABLET | Freq: Every day | ORAL | 2 refills | Status: DC

## 2022-10-20 ENCOUNTER — Encounter (INDEPENDENT_AMBULATORY_CARE_PROVIDER_SITE_OTHER): Payer: BC Managed Care – PPO | Admitting: Ophthalmology

## 2022-10-21 DIAGNOSIS — L237 Allergic contact dermatitis due to plants, except food: Secondary | ICD-10-CM | POA: Diagnosis not present

## 2022-10-21 DIAGNOSIS — E113413 Type 2 diabetes mellitus with severe nonproliferative diabetic retinopathy with macular edema, bilateral: Secondary | ICD-10-CM | POA: Diagnosis not present

## 2022-11-04 NOTE — Progress Notes (Signed)
[facility] - Clinic Note  11/05/2022     CHIEF COMPLAINT Patient presents for Retina Follow Up   HISTORY OF PRESENT ILLNESS: Juan [name] is a 62 y.o. male who presents to the clinic today for:   HPI     Retina Follow Up   Patient presents with  Diabetic Retinopathy.  In both eyes.  This started 5 weeks ago.  I, the attending physician,  performed the HPI with the patient and updated documentation appropriately.        Comments   Patient feels the vision is the same. He had a lot of floaters in the left eye about a week ago. He is not using eye drops. His blood sugar was 112.       Last edited by Rennis Chris, MD on 11/05/2022  1:42 PM.    Patient is delayed to appointment due to moving. He has had a lot of floaters about a week ago. Patient has been on steroids due to having poison sumac.    Referring physician: Laurann Montana, MD [id number] [address] [place],  [place] [address]  HISTORICAL INFORMATION:   Selected notes from the MEDICAL RECORD NUMBER Referred by Dr. Jethro Bolus for concern of CME s/p cataract sx   CURRENT MEDICATIONS: Current Outpatient Medications (Ophthalmic Drugs)  Medication Sig   dorzolamide-timolol (COSOPT) 22.3-6.8 MG/ML ophthalmic solution Place 1 drop into the right eye 2 (two) times daily. (Patient taking differently: Place 1 drop into both eyes 2 (two) times daily.)   No current facility-administered medications for this visit. (Ophthalmic Drugs)   Current Outpatient Medications (Other)  Medication Sig   ADVAIR DISKUS 500-50 MCG/DOSE AEPB Inhale 1 puff into the lungs 2 (two) times daily.   albuterol (VENTOLIN HFA) 108 (90 Base) MCG/ACT inhaler Inhale 1-2 puffs into the lungs every 6 (six) hours as needed for wheezing or shortness of breath.   alfuzosin (UROXATRAL) 10 MG 24 hr tablet TAKE 1 TABLET BY MOUTH EVERYDAY AT BEDTIME   aspirin EC 81 MG tablet Take 81 mg by mouth daily.   atorvastatin (LIPITOR) 40  MG tablet Take 1 tablet (40 mg total) by mouth daily.   carvedilol (COREG) 12.5 MG tablet Take 1 tablet (12.5 mg total) by mouth 2 (two) times daily with a meal.   Empagliflozin-metFORMIN HCl (SYNJARDY) 12.06-998 MG TABS Take 1 tablet by mouth 2 (two) times a day.    esomeprazole (NEXIUM) 40 MG capsule Take 40 mg by mouth daily at 12 noon.   famotidine (PEPCID) 40 MG tablet Take by mouth.   insulin glargine, 2 Unit Dial, (TOUJEO MAX SOLOSTAR) 300 UNIT/ML Solostar Pen Inject 40 Units into the skin daily.    iron polysaccharides (NIFEREX) 150 MG capsule Take 150 mg by mouth daily.   isosorbide mononitrate (IMDUR) 30 MG 24 hr tablet Take 1 tablet (30 mg total) by mouth daily.   lisinopril (ZESTRIL) 40 MG tablet Take 1 tablet (40 mg total) by mouth daily.   nitroGLYCERIN (NITROSTAT) 0.4 MG SL tablet Place 0.4 mg under the tongue daily.   OZEMPIC, 0.25 OR 0.5 MG/DOSE, 2 MG/3ML SOPN Inject 0.25 mg into the skin once a week.   No current facility-administered medications for this visit. (Other)   REVIEW OF SYSTEMS: ROS   Positive for: Gastrointestinal, Musculoskeletal, Endocrine, Eyes, Respiratory Negative for: Constitutional, Neurological, Skin, Genitourinary, HENT, Cardiovascular, Psychiatric, Allergic/Imm, Heme/Lymph Last edited by Charlette Caffey, COT on 11/05/2022  8:35 AM.  ALLERGIES Allergies  Allergen Reactions   Naproxen Shortness Of Breath   Naproxen Sodium Shortness Of Breath   Tiotropium Bromide Monohydrate Shortness Of Breath   Penicillins Rash    Has patient had a PCN reaction causing immediate rash, facial/tongue/throat swelling, SOB or lightheadedness with hypotension: Yes Has patient had a PCN reaction causing severe rash involving mucus membranes or skin necrosis: No Has patient had a PCN reaction that required hospitalization No Has patient had a PCN reaction occurring within the last 10 years: No If all of the above answers are "NO", then may proceed with  Cephalosporin use.  Has patient had a PCN reaction causing immediate rash, facial/tongue/throat swelling, SOB or lightheadedness with hypotension: Yes Has patient had a PCN reaction causing severe rash involving mucus membranes or skin necrosis: No Has patient had a PCN reaction that required hospitalization No Has patient had a PCN reaction occurring within the last 10 years: No If all of the above answers are "NO", then may proceed with Cephalosporin use.    PAST MEDICAL HISTORY Past Medical History:  Diagnosis Date   Arthritis    Asthma    COPD (chronic obstructive pulmonary disease) (HCC)    Diabetes mellitus    Type 2   Diabetic retinopathy (HCC)    NPDR OU   GERD (gastroesophageal reflux disease)    H/O hiatal hernia    History of kidney stones    Hyperlipemia    Hypertension    Hypertensive retinopathy    OU   Nasal polyps    Pneumonia 2014   Shortness of breath    Sleep apnea    pt has CPAP but is unable to use it d/t having polyps in his nose. Pt stated "I need to call and get a CPAP mask instead"   Stomach ulcer    Past Surgical History:  Procedure Laterality Date   CATARACT EXTRACTION Right 07/05/2018   Dr. Nile Riggs   CATARACT EXTRACTION Left 07/12/2018   Dr. Nile Riggs   CHOLECYSTECTOMY N/A 01/23/2015   Procedure: LAPAROSCOPIC CHOLECYSTECTOMY ;  Surgeon: Violeta Gelinas, MD;  Location[facility];  Service: General;  Laterality: N/A;   COLONOSCOPY W/ POLYPECTOMY     ESOPHAGOGASTRODUODENOSCOPY     EYE SURGERY     KNEE ARTHROSCOPY  03/24/2011   Procedure: ARTHROSCOPY KNEE;  Surgeon: Harvie Junior, MD;  Location: [facility];  Service: Orthopedics;  Laterality: Right;  partial medial menisectomy, chondroplaty patella, and medial medial plica   LEFT HEART CATH AND CORONARY ANGIOGRAPHY N/A 06/29/2019   Procedure: LEFT HEART CATH AND CORONARY ANGIOGRAPHY;  Surgeon: Iran Ouch, MD;  Location: [facility] IN[facility]ASIVE [facility];  Service: Cardiovascular;  Laterality: N/A;    FAMILY HISTORY Family History  Problem Relation Age of Onset   Diabetes Father    Coronary artery disease Father    SOCIAL HISTORY Social History   Tobacco Use   Smoking status: Former    Current packs/day: 3.00    Average packs/day: 3.0 packs/day for 20.0 years (60.0 ttl pk-yrs)    Types: Cigarettes   Smokeless tobacco: Current    Types: Chew   Tobacco comments:    1 can/daily  Vaping Use   Vaping status: Never Used  Substance Use Topics   Alcohol use: Yes    Comment: social, 1x weekly   Drug use: No       OPHTHALMIC EXAM:  Base Eye Exam     Visual Acuity (Snellen - Linear)  Right Left   Dist [place] 20/40 20/70 +1   Dist ph [place] 20/20 20/60         Tonometry (Tonopen, 8:40 AM)       Right Left   Pressure 13 14         Pupils       Dark Light Shape React APD   Right 3 2 Round Brisk None   Left 3 2 Round Brisk None         Visual Fields       Left Right    Full Full         Extraocular Movement       Right Left    Full, Ortho Full, Ortho         Neuro/Psych     Oriented x3: Yes   Mood/Affect: Normal         Dilation     Both eyes: 1.0% Mydriacyl, 2.5% Phenylephrine @ 8:35 AM           Slit Lamp and Fundus Exam     Slit Lamp Exam       Right Left   Lids/Lashes Dermatochalasis - upper lid, Telangiectasia, mild Meibomian gland dysfunction Dermatochalasis - upper lid, Telangiectasia, mild Meibomian gland dysfunction   Conjunctiva/Sclera White and quiet Mild nasal Pinguecula   Cornea Mild Arcus, Well healed temporal cataract wounds Arcus, Well healed temporal cataract wounds, trace PEE   Anterior Chamber Deep and clear Deep and clear   Iris Round and dilated, mild patch of atrophy at 0800, no NVI Round and dilated, No NVI   Lens PC IOL in good position PC IOL in good position   Anterior Vitreous Vitreous syneresis Vitreous syneresis         Fundus Exam       Right Left   Disc trace pallor, Sharp rim Pallor,  Sharp rim, Compact, mild PPP temporal   C/D Ratio 0.3 0.3   Macula Good foveal reflex, scattered IRH/DBH -- greatest temporal macula, trace cystic changes temporal mac, mild focal laser changes Blunted foveal reflex, +central atrophy and pigment clumping, scattered DBH and Mas -- improved, scattered punctate exudates - greatest superior and temporal macula -- persistent, cystic changes / edema temporal macula -- slightly increased   Vessels attenuated, Tortuous attenuated, Tortuous   Periphery Attached, scattered DBH greatest posteriorly Attached, pigmented choroidal nevus superiorly with mild elevation, +drusen, no SRF or orange pigment -- unchanged from prior, scattered MA/DBH           Refraction     Manifest Refraction       Sphere Cylinder Axis Dist VA   Right -1.25 +0.75 030 20/25   Left -0.50 +0.25 010 20/50           IMAGING AND PROCEDURES  Imaging and Procedures for @TODAY @  OCT, Retina - OU - Both Eyes       Right Eye Quality was good. Central Foveal Thickness: 248. Progression has worsened. Findings include normal foveal contour, no SRF, intraretinal hyper-reflective material, intraretinal fluid, outer retinal atrophy, vitreomacular adhesion (Trace persistent cystic changes temporal and superior macula -- slightly increased).   Left Eye Quality was good. Central Foveal Thickness: 204. Progression has worsened. Findings include normal foveal contour, no SRF, intraretinal hyper-reflective material, intraretinal fluid, outer retinal atrophy (Persistent IRF/IRHM greatest ST mac-- slightly increased, persistent central ORA; Sub-RPE hyper-reflective mass consistent with choroidal nevus -- superior to disc, caught on widefield -- stable from prior).   Notes *Images  captured and stored on drive  Diagnosis / Impression: +DME OU OD: Trace persistent cystic changes temporal and superior macula -- slightly increased OS: Persistent IRF/IRHM greatest ST mac-- slightly  increased, persistent central ORA; Sub-RPE hyper-reflective mass consistent with choroidal nevus -- superior to disc, caught on widefield -- stable from prior  Clinical management:  See below  Abbreviations: NFP - Normal foveal profile. CME - cystoid macular edema. PED - pigment epithelial detachment. IRF - intraretinal fluid. SRF - subretinal fluid. EZ - ellipsoid zone. ERM - epiretinal membrane. ORA - outer retinal atrophy. ORT - outer retinal tubulation. SRHM - subretinal hyper-reflective material      Intravitreal Injection, Pharmacologic Agent - OS - Left Eye       Time Out 11/05/2022. 9:44 AM. Confirmed correct patient, procedure, site, and patient consented.   Anesthesia Topical anesthesia was used. Anesthetic medications included Lidocaine 2%, Proparacaine 0.5%.   Procedure Preparation included 5% betadine to ocular surface, eyelid speculum. A (32g) needle was used.   Injection: 6 mg faricimab-svoa 6 MG/0.05ML   Route: Intravitreal, Site: Left Eye   NDC: 81191-478-29, Lot: [id number], Expiration date: 04/30/2024, Waste: 0 mL   Post-op Post injection exam found visual acuity of at least counting fingers. The patient tolerated the procedure well. There were no complications. The patient received written and verbal post procedure care education. Post injection medications were not given.            ASSESSMENT/PLAN:    ICD-10-CM   1. Severe nonproliferative diabetic retinopathy of both eyes with macular edema associated with type 2 diabetes mellitus (HCC)  E11.3413 OCT, Retina - OU - Both Eyes    Intravitreal Injection, Pharmacologic Agent - OS - Left Eye    faricimab-svoa (VABYSMO) 6mg /0.33mL intravitreal injection    2. Current use of insulin (HCC)  Z79.4     3. Long term (current) use of oral hypoglycemic drugs  Z79.84     4. 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 Essential hypertension  I10     6. Hypertensive retinopathy of both eyes  H35.033     7.  Pseudophakia of both eyes  Z96.1     8. Ocular hypertension of right eye  H40.051     9. Diplopia  H53.2      **delayed f/u: 7 wks instead of 5 due to moving**  1-3. Severe nonproliferative diabetic retinopathy with DME, OU (OS>OD)  - A1C 6.3 (07.26.24) - s/p IVA OD #1 (06.26.20), #2 (08.14.20), #3 (09.17.20), #4 (10.16.20), #5 (02.02.22)  - s/p IVA OS #1 (07.17.20), #2 (08.14.20), #3 (09.17.20)  **IVA RESISTANCE OU**  - s/p focal laser OD (07.18.23) - s/p IVE OD #1 (11.13.20), #2 (12.11.20), #3 (01.18.21), #4 (02.19.21), #5 (03.19.21), #6 (04.16.21), #7 (05.14.21), #8 (06.17.21), #9 (07.16.21), #10 (09.21.21), #11 (10.29.21), #12 (11.29.21), #13 (12.29.21), #14 (03.09.22), #15 (04.06.22), #16 (05.11.12), #17 (06.15.22), #18 (7.20.22), #19 (08.24.22), #20 (10.04.22), #21 (11.08.22), #22 (12.13.22), #23 (02.21.23), #24 (03.28.23), #25 (05.09.23), #26 (06.20.23), #27 (08.01.23) - s/p IVE OS #1 (10.16.20) - sample, #2 (11.13.20), #3 (12.11.20), #4 (01.18.21), #5 (02.19.21), #6 (03.19.21), #7 (04.16.21), #8 (05.14.21), #9 (06.17.21), #10 (7.16.21), #11 (09.21.21), #12 (10.29.21), #13 (11.29.21), #14 (12.29.21), #15 (02.02.22, sample), #16 (03.09.22), #17 (04.06.22), #18 (05.11.2012), #19 (06.15.22), #20 (7.20.22), #21 (08.24.22), #22 (10.4.22), #23 (11.8.22), #24 (12.13.22), #25 (01.17.23), #26 (02.21.23), #27 (03.28.23). #28 (05.09.23), #29 (06.20.23), #30 (08.01.23), #31 (09.12.23), #32 (10.24.23), #33 (12.05.23) -- IVE resistance - s/p IVV OS #1 (01.16.24), #  2 (02.20.24), #3 (03.26.24), #4 (04.30.24), #5 (06.04.24), #6 (07.09.24) #7(08.14.24) - FA (06.26.20) shows Severe NPDR OU w/ Late leaking MA OU, Enlarged FAZ OU, No NV OU, No significant hyperfluorescence of disc  - BCVA OD 20/20; OS 20/60 from 20/40 - OCT shows OD: trace persistent cystic changes temporal and superior macula - slightly increased; OS: Persistent IRF/IRHM greatest ST mac-- slightly increased, persistent central ORA; Sub-RPE  hyper-reflective mass consistent with choroidal nevus -- superior to disc, caught on widefield -- stable from prior-- stable from prior at 7 weeks  - recommend IVV OS #8 today, 10.03.24 w/ f/u at 5 wks  - will hold injection OD again today   - pt in agreement  - RBA of procedure discussed, questions answered  - see procedure note  - Vabys[employer] informed consent form signed and scanned, 01.16.24 (OU)  - [email or web address] Benefits Investigation initiated 10.16.2020 -- approved for 2024  - Vabysmo auth approved for 2024  - f/u 5 weeks, DFE, OCT, possible injections  4. Choroidal Nevus OS  - located at 1200, mild elevation, +drusen, no SRF  - baseline optos pictures obtained, 06.26.20  - discussed possible referral to oncology at [facility] in [place]  5,6. Hypertensive retinopathy OU  - discussed importance of tight BP control  - monitor  7. Pseudophakia OU  - s/p CE/IOL OU (OD on 06.03.20 and OS 06.10.20 by Dr. Nile Riggs)  - beautiful surgeries w/ IOLs in excellent position  - macular edema limiting vision as above  - monitor  8. Ocular hypertension   - IOP 20,20 OU  - cont Cosopt qd OU  - monitor  9. Intermittent diplopia - pt reports intermittent episodes of diplopia with both vertical and horizontal components; no rotational - pt had appt with Dr. Ernesto Rutherford on 11.9.23 -- noted some improvement with prism but did not recommend getting the prism at this time  - pt feels diplopia is improved -- episodes less frequent   Ophthalmic Meds Ordered this visit:  Meds ordered this encounter  Medications   faricimab-svoa (VABYSMO) 6mg /0.70mL intravitreal injection     Return in about 5 weeks (around 12/10/2022) for f/u NPDR OU , DFE, OCT, Possible, IVV, OS.  There are no Patient Instructions on file for this visit.  This document serves as a record of services personally performed by Karie Chimera, MD, PhD. It was created on their behalf by Charlette Caffey, COT, an ophthalmic  technician. The creation of this record is the provider's dictation and/or activities during the visit.    Electronically signed by:  Charlette Caffey, COT  11/05/22 11:52 PM  Karie Chimera, M.D., Ph.D. Diseases & Surgery of the Retina and Vitreous [facility] 11/05/2022   I have reviewed the above documentation for accuracy and completeness, and I agree with the above. Karie Chimera, M.D., Ph.D. 11/05/22 11:56 PM   Abbreviations: M myopia (nearsighted); A astigmatism; H hyperopia (farsighted); P presbyopia; Mrx spectacle prescription;  CTL contact lenses; OD right eye; OS left eye; OU both eyes  XT exotropia; ET esotropia; PEK punctate epithelial keratitis; PEE punctate epithelial erosions; DES dry eye syndrome; MGD meibomian gland dysfunction; ATs artificial tears; PFAT's preservative free artificial tears; NSC nuclear sclerotic cataract; PSC posterior subcapsular cataract; ERM epi-retinal membrane; PVD posterior vitreous detachment; RD retinal detachment; DM diabetes mellitus; DR diabetic retinopathy; NPDR non-proliferative diabetic retinopathy; PDR proliferative diabetic retinopathy; CSME clinically significant macular edema; DME diabetic macular edema; dbh dot blot hemorrhages;  CWS cotton wool spot; POAG primary open angle glaucoma; C/D cup-to-disc ratio; HVF humphrey visual field; GVF goldmann visual field; OCT optical coherence tomography; IOP intraocular pressure; BRVO Branch retinal vein occlusion; CRVO central retinal vein occlusion; CRAO central retinal artery occlusion; BRAO branch retinal artery occlusion; RT retinal tear; SB scleral buckle; PPV pars plana vitrectomy; VH Vitreous hemorrhage; PRP panretinal laser photocoagulation; IVK intravitreal kenalog; VMT vitreomacular traction; MH Macular hole;  NVD neovascularization of the disc; NVE neovascularization elsewhere; AREDS age related eye disease study; ARMD age related macular degeneration; POAG primary open  angle glaucoma; EBMD epithelial/anterior basement membrane dystrophy; ACIOL anterior chamber intraocular lens; IOL intraocular lens; PCIOL posterior chamber intraocular lens; Phaco/IOL phacoemulsification with intraocular lens placement; PRK photorefractive keratectomy; LASIK laser assisted in situ keratomileusis; HTN hypertension; DM diabetes mellitus; COPD chronic obstructive pulmonary disease

## 2022-11-05 ENCOUNTER — Ambulatory Visit (INDEPENDENT_AMBULATORY_CARE_PROVIDER_SITE_OTHER): Payer: BC Managed Care – PPO | Admitting: Ophthalmology

## 2022-11-05 ENCOUNTER — Encounter (INDEPENDENT_AMBULATORY_CARE_PROVIDER_SITE_OTHER): Payer: Self-pay | Admitting: Ophthalmology

## 2022-11-05 DIAGNOSIS — Z794 Long term (current) use of insulin: Secondary | ICD-10-CM

## 2022-11-05 DIAGNOSIS — E113413 Type 2 diabetes mellitus with severe nonproliferative diabetic retinopathy with macular edema, bilateral: Secondary | ICD-10-CM | POA: Diagnosis not present

## 2022-11-05 DIAGNOSIS — D3132 Benign neoplasm of left choroid: Secondary | ICD-10-CM

## 2022-11-05 DIAGNOSIS — H40051 Ocular hypertension, right eye: Secondary | ICD-10-CM

## 2022-11-05 DIAGNOSIS — H35033 Hypertensive retinopathy, bilateral: Secondary | ICD-10-CM

## 2022-11-05 DIAGNOSIS — Z7984 Long term (current) use of oral hypoglycemic drugs: Secondary | ICD-10-CM | POA: Diagnosis not present

## 2022-11-05 DIAGNOSIS — I10 Essential (primary) hypertension: Secondary | ICD-10-CM

## 2022-11-05 DIAGNOSIS — Z961 Presence of intraocular lens: Secondary | ICD-10-CM

## 2022-11-05 DIAGNOSIS — H532 Diplopia: Secondary | ICD-10-CM

## 2022-11-05 MED ORDER — FARICIMAB-SVOA 6 MG/0.05ML IZ SOSY
6.0000 mg | PREFILLED_SYRINGE | INTRAVITREAL | Status: AC | PRN
Start: 2022-11-05 — End: 2022-11-05
  Administered 2022-11-05: 6 mg via INTRAVITREAL

## 2022-11-19 DIAGNOSIS — J189 Pneumonia, unspecified organism: Secondary | ICD-10-CM | POA: Diagnosis not present

## 2022-12-09 NOTE — Progress Notes (Signed)
[facility] - Clinic Note  12/10/2022     CHIEF COMPLAINT Patient presents for Retina Follow Up   HISTORY OF PRESENT ILLNESS: Juan [name] is a 62 y.o. male who presents to the clinic today for:   HPI     Retina Follow Up   Patient presents with  Diabetic Retinopathy.  In both eyes.  This started 5 weeks ago.  I, the attending physician,  performed the HPI with the patient and updated documentation appropriately.        Comments   Patient here for 5 weeks retina follow up for NPDR OU. Patient states vision about the same. No eye pain. Not using drops.       Last edited by Rennis Chris, MD on 12/10/2022  3:13 PM.     Referring physician: Laurann Montana, MD [phone] [address] [place],  [place] [address]  HISTORICAL INFORMATION:   Selected notes from the MEDICAL RECORD NUMBER Referred by Dr. Jethro Bolus for concern of CME s/p cataract sx   CURRENT MEDICATIONS: Current Outpatient Medications (Ophthalmic Drugs)  Medication Sig   dorzolamide-timolol (COSOPT) 22.3-6.8 MG/ML ophthalmic solution Place 1 drop into the right eye 2 (two) times daily. (Patient taking differently: Place 1 drop into both eyes 2 (two) times daily.)   No current facility-administered medications for this visit. (Ophthalmic Drugs)   Current Outpatient Medications (Other)  Medication Sig   ADVAIR DISKUS 500-50 MCG/DOSE AEPB Inhale 1 puff into the lungs 2 (two) times daily.   albuterol (VENTOLIN HFA) 108 (90 Base) MCG/ACT inhaler Inhale 1-2 puffs into the lungs every 6 (six) hours as needed for wheezing or shortness of breath.   alfuzosin (UROXATRAL) 10 MG 24 hr tablet TAKE 1 TABLET BY MOUTH EVERYDAY AT BEDTIME   aspirin EC 81 MG tablet Take 81 mg by mouth daily.   atorvastatin (LIPITOR) 40 MG tablet Take 1 tablet (40 mg total) by mouth daily.   carvedilol (COREG) 12.5 MG tablet Take 1 tablet (12.5 mg total) by mouth 2 (two) times daily with a meal.    Empagliflozin-metFORMIN HCl (SYNJARDY) 12.06-998 MG TABS Take 1 tablet by mouth 2 (two) times a day.    esomeprazole (NEXIUM) 40 MG capsule Take 40 mg by mouth daily at 12 noon.   famotidine (PEPCID) 40 MG tablet Take by mouth.   insulin glargine, 2 Unit Dial, (TOUJEO MAX SOLOSTAR) 300 UNIT/ML Solostar Pen Inject 40 Units into the skin daily.    iron polysaccharides (NIFEREX) 150 MG capsule Take 150 mg by mouth daily.   isosorbide mononitrate (IMDUR) 30 MG 24 hr tablet Take 1 tablet (30 mg total) by mouth daily.   lisinopril (ZESTRIL) 40 MG tablet Take 1 tablet (40 mg total) by mouth daily.   nitroGLYCERIN (NITROSTAT) 0.4 MG SL tablet Place 0.4 mg under the tongue daily.   OZEMPIC, 0.25 OR 0.5 MG/DOSE, 2 MG/3ML SOPN Inject 0.25 mg into the skin once a week.   No current facility-administered medications for this visit. (Other)   REVIEW OF SYSTEMS: ROS   Positive for: Gastrointestinal, Musculoskeletal, Endocrine, Eyes, Respiratory Negative for: Constitutional, Neurological, Skin, Genitourinary, HENT, Cardiovascular, Psychiatric, Allergic/Imm, Heme/Lymph Last edited by [name], COA on 12/10/2022  8:45 AM.       ALLERGIES Allergies  Allergen Reactions   Naproxen Shortness Of Breath   Naproxen Sodium Shortness Of Breath   Tiotropium Bromide Monohydrate Shortness Of Breath   Penicillins Rash    Has patient had a  PCN reaction causing immediate rash, facial/tongue/throat swelling, SOB or lightheadedness with hypotension: Yes Has patient had a PCN reaction causing severe rash involving mucus membranes or skin necrosis: No Has patient had a PCN reaction that required hospitalization No Has patient had a PCN reaction occurring within the last 10 years: No If all of the above answers are "NO", then may proceed with Cephalosporin use.  Has patient had a PCN reaction causing immediate rash, facial/tongue/throat swelling, SOB or lightheadedness with hypotension: Yes Has patient had a  PCN reaction causing severe rash involving mucus membranes or skin necrosis: No Has patient had a PCN reaction that required hospitalization No Has patient had a PCN reaction occurring within the last 10 years: No If all of the above answers are "NO", then may proceed with Cephalosporin use.    PAST MEDICAL HISTORY Past Medical History:  Diagnosis Date   Arthritis    Asthma    COPD (chronic obstructive pulmonary disease) (HCC)    Diabetes mellitus    Type 2   Diabetic retinopathy (HCC)    NPDR OU   GERD (gastroesophageal reflux disease)    H/O hiatal hernia    History of kidney stones    Hyperlipemia    Hypertension    Hypertensive retinopathy    OU   Nasal polyps    Pneumonia 2014   Shortness of breath    Sleep apnea    pt has CPAP but is unable to use it d/t having polyps in his nose. Pt stated "I need to call and get a CPAP mask instead"   Stomach ulcer    Past Surgical History:  Procedure Laterality Date   CATARACT EXTRACTION Right 07/05/2018   Dr. Nile Riggs   CATARACT EXTRACTION Left 07/12/2018   Dr. Nile Riggs   CHOLECYSTECTOMY N/A 01/23/2015   Procedure: LAPAROSCOPIC CHOLECYSTECTOMY ;  Surgeon: Violeta Gelinas, MD;  Location: [facility] OR;  Service: General;  Laterality: N/A;   COLONOSCOPY W/ POLYPECTOMY     ESOPHAGOGASTRODUODENOSCOPY     EYE SURGERY     KNEE ARTHROSCOPY  03/24/2011   Procedure: ARTHROSCOPY KNEE;  Surgeon: Harvie Junior, MD;  Location: [facility];  Service: Orthopedics;  Laterality: Right;  partial medial menisectomy, chondroplaty patella, and medial medial plica   LEFT HEART CATH AND CORONARY ANGIOGRAPHY N/A 06/29/2019   Procedure: LEFT HEART CATH AND CORONARY ANGIOGRAPHY;  Surgeon: Iran Ouch, MD;  Location: [facility];  Service: Cardiovascular;  Laterality: N/A;   FAMILY HISTORY Family History  Problem Relation Age of Onset   Diabetes Father    Coronary artery disease Father    SOCIAL HISTORY Social History   Tobacco Use    Smoking status: Former    Current packs/day: 3.00    Average packs/day: 3.0 packs/day for 20.0 years (60.0 ttl pk-yrs)    Types: Cigarettes   Smokeless tobacco: Current    Types: Chew   Tobacco comments:    1 can/daily  Vaping Use   Vaping status: Never Used  Substance Use Topics   Alcohol use: Yes    Comment: social, 1x weekly   Drug use: No       OPHTHALMIC EXAM:  Base Eye Exam     Visual Acuity (Snellen - Linear)       Right Left   Dist [place] 20/40 -2 20/50 -1   Dist ph [place] 20/20 -2 20/40 -1         Tonometry (Tonopen, 8:43 AM)  Right Left   Pressure 15 13         Pupils       Dark Light Shape React APD   Right 3 2 Round Brisk None   Left 3 2 Round Brisk None         Visual Fields (Counting fingers)       Left Right    Full Full         Extraocular Movement       Right Left    Full, Ortho Full, Ortho         Neuro/Psych     Oriented x3: Yes   Mood/Affect: Normal         Dilation     Both eyes: 1.0% Mydriacyl, 2.5% Phenylephrine @ 8:43 AM           Slit Lamp and Fundus Exam     Slit Lamp Exam       Right Left   Lids/Lashes Dermatochalasis - upper lid, Telangiectasia, mild Meibomian gland dysfunction Dermatochalasis - upper lid, Telangiectasia, mild Meibomian gland dysfunction   Conjunctiva/Sclera White and quiet Mild nasal Pinguecula   Cornea Mild Arcus, Well healed temporal cataract wounds Arcus, Well healed temporal cataract wounds, trace PEE   Anterior Chamber Deep and clear Deep and clear   Iris Round and dilated, mild patch of atrophy at 0800, no NVI Round and dilated, No NVI   Lens PC IOL in good position PC IOL in good position   Anterior Vitreous Vitreous syneresis Vitreous syneresis         Fundus Exam       Right Left   Disc trace pallor, Sharp rim Pallor, Sharp rim, Compact, mild PPP temporal   C/D Ratio 0.3 0.3   Macula Good foveal reflex, scattered IRH/DBH -- greatest temporal macula, trace cystic  changes temporal mac -- slightly increased, mild focal laser changes Blunted foveal reflex, +central atrophy and pigment clumping, scattered DBH and Mas -- improved, scattered punctate exudates - greatest superior and temporal macula -- persistent, cystic changes / edema temporal macula -- slightly improved   Vessels attenuated, Tortuous attenuated, Tortuous   Periphery Attached, scattered DBH greatest posteriorly, prominent blot heme along SN arcades Attached, pigmented choroidal nevus superiorly with mild elevation, +drusen, no SRF or orange pigment -- unchanged from prior, scattered MA/DBH           IMAGING AND PROCEDURES  Imaging and Procedures for @TODAY @  OCT, Retina - OU - Both Eyes       Right Eye Quality was good. Central Foveal Thickness: 248. Progression has worsened. Findings include normal foveal contour, no SRF, intraretinal hyper-reflective material, intraretinal fluid, outer retinal atrophy, vitreomacular adhesion (Interval increase in IRF ST macula ).   Left Eye Quality was good. Central Foveal Thickness: 212. Progression has improved. Findings include normal foveal contour, no SRF, intraretinal hyper-reflective material, intraretinal fluid, outer retinal atrophy (Persistent IRF/IRHM ST fovea and mac -- slightly improved centrally, persistent central ORA; Sub-RPE hyper-reflective mass consistent with choroidal nevus -- superior to disc, caught on widefield -- stable from prior).   Notes *Images captured and stored on drive  Diagnosis / Impression: +DME OU OD: Interval increase in IRF ST macula  OS: Persistent IRF/IRHM ST fovea and mac -- slightly improved centrally, persistent central ORA; Sub-RPE hyper-reflective mass consistent with choroidal nevus -- superior to disc, caught on widefield -- stable from prior  Clinical management:  See below  Abbreviations: NFP - Normal foveal profile. CME -  cystoid macular edema. PED - pigment epithelial detachment. IRF -  intraretinal fluid. SRF - subretinal fluid. EZ - ellipsoid zone. ERM - epiretinal membrane. ORA - outer retinal atrophy. ORT - outer retinal tubulation. SRHM - subretinal hyper-reflective material      Intravitreal Injection, Pharmacologic Agent - OD - Right Eye       Time Out 12/10/2022. 9:06 AM. Confirmed correct patient, procedure, site, and patient consented.   Anesthesia Topical anesthesia was used. Anesthetic medications included Lidocaine 2%, Proparacaine 0.5%.   Procedure Preparation included 5% betadine to ocular surface, eyelid speculum. A supplied (32g) needle was used.   Injection: 6 mg faricimab-svoa 6 MG/0.05ML   Route: Intravitreal, Site: Right Eye   NDC: R2083049, Lot: [id number], Expiration date: 05/01/2024, Waste: 0 mL   Post-op Post injection exam found visual acuity of at least counting fingers. The patient tolerated the procedure well. There were no complications. The patient received written and verbal post procedure care education.      Intravitreal Injection, Pharmacologic Agent - OS - Left Eye       Time Out 12/10/2022. 9:06 AM. Confirmed correct patient, procedure, site, and patient consented.   Anesthesia Topical anesthesia was used. Anesthetic medications included Lidocaine 2%, Proparacaine 0.5%.   Procedure Preparation included 5% betadine to ocular surface, eyelid speculum. A (32g) needle was used.   Injection: 6 mg faricimab-svoa 6 MG/0.05ML   Route: Intravitreal, Site: Left Eye   NDC: 54098-119-14, Lot: [id number], Expiration date: 05/01/2024, Waste: 0 mL   Post-op Post injection exam found visual acuity of at least counting fingers. The patient tolerated the procedure well. There were no complications. The patient received written and verbal post procedure care education. Post injection medications were not given.            ASSESSMENT/PLAN:    ICD-10-CM   1. Severe nonproliferative diabetic retinopathy of both eyes with macular  edema associated with type 2 diabetes mellitus (HCC)  E11.3413 OCT, Retina - OU - Both Eyes    Intravitreal Injection, Pharmacologic Agent - OD - Right Eye    Intravitreal Injection, Pharmacologic Agent - OS - Left Eye    faricimab-svoa (VABYSMO) 6mg /0.47mL intravitreal injection    faricimab-svoa (VABYSMO) 6mg /0.37mL intravitreal injection    2. Current use of insulin (HCC)  Z79.4     3. Long term (current) use of oral hypoglycemic drugs  Z79.84     4. 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 Essential hypertension  I10     6. Hypertensive retinopathy of both eyes  H35.033     7. Pseudophakia of both eyes  Z96.1     8. Ocular hypertension of right eye  H40.051     9. Diplopia  H53.2       **delayed f/u: 7 wks instead of 5 due to moving**  1-3. Severe nonproliferative diabetic retinopathy with DME, OU (OS>OD)  - A1C 6.3 (07.26.24) - s/p IVA OD #1 (06.26.20), #2 (08.14.20), #3 (09.17.20), #4 (10.16.20), #5 (02.02.22)  - s/p IVA OS #1 (07.17.20), #2 (08.14.20), #3 (09.17.20)  **IVA RESISTANCE OU**  - s/p focal laser OD (07.18.23) - s/p IVE OD #1 (11.13.20), #2 (12.11.20), #3 (01.18.21), #4 (02.19.21), #5 (03.19.21), #6 (04.16.21), #7 (05.14.21), #8 (06.17.21), #9 (07.16.21), #10 (09.21.21), #11 (10.29.21), #12 (11.29.21), #13 (12.29.21), #14 (03.09.22), #15 (04.06.22), #16 (05.11.12), #17 (06.15.22), #18 (7.20.22), #19 (08.24.22), #20 (10.04.22), #21 (11.08.22), #22 (12.13.22), #23 (02.21.23), #24 (03.28.23), #25 (05.09.23), #26 (06.20.23), #27 (08.01.23) - s/p  IVE OS #1 (10.16.20) - sample, #2 (11.13.20), #3 (12.11.20), #4 (01.18.21), #5 (02.19.21), #6 (03.19.21), #7 (04.16.21), #8 (05.14.21), #9 (06.17.21), #10 (7.16.21), #11 (09.21.21), #12 (10.29.21), #13 (11.29.21), #14 (12.29.21), #15 (02.02.22, sample), #16 (03.09.22), #17 (04.06.22), #18 (05.11.2012), #19 (06.15.22), #20 (7.20.22), #21 (08.24.22), #22 (10.4.22), #23 (11.8.22), #24 (12.13.22), #25 (01.17.23), #26 (02.21.23),  #27 (03.28.23). #28 (05.09.23), #29 (06.20.23), #30 (08.01.23), #31 (09.12.23), #32 (10.24.23), #33 (12.05.23) -- IVE resistance - s/p IVV OS #1 (01.16.24), #2 (02.20.24), #3 (03.26.24), #4 (04.30.24), #5 (06.04.24), #6 (07.09.24), #7 (08.14.24), #8 (10.03.24) - FA (06.26.20) shows Severe NPDR OU w/ Late leaking MA OU, Enlarged FAZ OU, No NV OU, No significant hyperfluorescence of disc  - BCVA OD 20/20; OS stable at 20/40 - OCT shows OD: Interval increase in IRF ST macula; OS: Persistent IRF/IRHM ST fovea and mac -- slightly improved centrally, persistent central ORA; Sub-RPE hyper-reflective mass consistent with choroidal nevus -- superior to disc, caught on widefield -- stable from prior at 5 weeks  - recommend IVV OU (OD #1 and OS #9) today, 11.08.24 w/ f/u at 5 wks  - pt in agreement  - RBA of procedure discussed, questions answered  - see procedure note  - Vabysmo informed consent form signed and scanned, 11.08.24 (OU)  - Vabysmo auth approved for 2024  - insurance will only approve one med at a time -- currently approved for Vabysmo  - f/u 5 weeks, DFE, OCT, possible injections  4. Choroidal Nevus OS  - located at 1200, mild elevation, +drusen, no SRF  - baseline optos pictures obtained, 06.26.20  - discussed possible referral to oncology at [facility] in [place]  5,6. Hypertensive retinopathy OU  - discussed importance of tight BP control  - monitor  7. Pseudophakia OU  - s/p CE/IOL OU (OD on 06.03.20 and OS 06.10.20 by Dr. Nile Riggs)  - beautiful surgeries w/ IOLs in excellent position  - macular edema limiting vision as above  - monitor  8. Ocular hypertension   - IOP 15,13 OU  - cont Cosopt qd OU  - monitor  9. Intermittent diplopia - pt reports intermittent episodes of diplopia with both vertical and horizontal components; no rotational - pt had appt with Dr. Ernesto Rutherford on 11.9.23 -- noted some improvement with prism but did not recommend getting the prism at this  time  - pt feels diplopia is improved -- episodes less frequent   Ophthalmic Meds Ordered this visit:  Meds ordered this encounter  Medications   faricimab-svoa (VABYSMO) 6mg /0.81mL intravitreal injection   faricimab-svoa (VABYSMO) 6mg /0.70mL intravitreal injection     Return in about 5 weeks (around 01/14/2023) for f/u NPDR OU, DFE, OCT.  There are no Patient Instructions on file for this visit.  This document serves as a record of services personally performed by Karie Chimera, MD, PhD. It was created on their behalf by Berlin Hun COT, an ophthalmic technician. The creation of this record is the provider's dictation and/or activities during the visit.    Electronically signed by: Berlin Hun COT 11.07.24 11:09 PM  This document serves as a record of services personally performed by Karie Chimera, MD, PhD. It was created on their behalf by Glee Arvin. Manson Passey, OA an ophthalmic technician. The creation of this record is the provider's dictation and/or activities during the visit.    Electronically signed by: Glee Arvin. Manson Passey, OA 12/12/22 11:09 PM  Karie Chimera, M.D., Ph.D. Diseases & Surgery of the Retina and Vitreous [facility] 12/10/2022   I have reviewed the above documentation for accuracy and completeness, and I agree with the above. Karie Chimera, M.D., Ph.D. 12/12/22 11:10 PM   Abbreviations: M myopia (nearsighted); A astigmatism; H hyperopia (farsighted); P presbyopia; Mrx spectacle prescription;  CTL contact lenses; OD right eye; OS left eye; OU both eyes  XT exotropia; ET esotropia; PEK punctate epithelial keratitis; PEE punctate epithelial erosions; DES dry eye syndrome; MGD meibomian gland dysfunction; ATs artificial tears; PFAT's preservative free artificial tears; NSC nuclear sclerotic cataract; PSC posterior subcapsular cataract; ERM epi-retinal membrane; PVD posterior vitreous detachment; RD retinal detachment; DM diabetes  mellitus; DR diabetic retinopathy; NPDR non-proliferative diabetic retinopathy; PDR proliferative diabetic retinopathy; CSME clinically significant macular edema; DME diabetic macular edema; dbh dot blot hemorrhages; CWS cotton wool spot; POAG primary open angle glaucoma; C/D cup-to-disc ratio; HVF humphrey visual field; GVF goldmann visual field; OCT optical coherence tomography; IOP intraocular pressure; BRVO Branch retinal vein occlusion; CRVO central retinal vein occlusion; CRAO central retinal artery occlusion; BRAO branch retinal artery occlusion; RT retinal tear; SB scleral buckle; PPV pars plana vitrectomy; VH Vitreous hemorrhage; PRP panretinal laser photocoagulation; IVK intravitreal kenalog; VMT vitreomacular traction; MH Macular hole;  NVD neovascularization of the disc; NVE neovascularization elsewhere; AREDS age related eye disease study; ARMD age related macular degeneration; POAG primary open angle glaucoma; EBMD epithelial/anterior basement membrane dystrophy; ACIOL anterior chamber intraocular lens; IOL intraocular lens; PCIOL posterior chamber intraocular lens; Phaco/IOL phacoemulsification with intraocular lens placement; PRK photorefractive keratectomy; LASIK laser assisted in situ keratomileusis; HTN hypertension; DM diabetes mellitus; COPD chronic obstructive pulmonary disease

## 2022-12-10 ENCOUNTER — Ambulatory Visit (INDEPENDENT_AMBULATORY_CARE_PROVIDER_SITE_OTHER): Payer: BC Managed Care – PPO | Admitting: Ophthalmology

## 2022-12-10 ENCOUNTER — Encounter (INDEPENDENT_AMBULATORY_CARE_PROVIDER_SITE_OTHER): Payer: Self-pay | Admitting: Ophthalmology

## 2022-12-10 DIAGNOSIS — I10 Essential (primary) hypertension: Secondary | ICD-10-CM

## 2022-12-10 DIAGNOSIS — Z7984 Long term (current) use of oral hypoglycemic drugs: Secondary | ICD-10-CM | POA: Diagnosis not present

## 2022-12-10 DIAGNOSIS — Z794 Long term (current) use of insulin: Secondary | ICD-10-CM | POA: Diagnosis not present

## 2022-12-10 DIAGNOSIS — E113413 Type 2 diabetes mellitus with severe nonproliferative diabetic retinopathy with macular edema, bilateral: Secondary | ICD-10-CM

## 2022-12-10 DIAGNOSIS — D3132 Benign neoplasm of left choroid: Secondary | ICD-10-CM | POA: Diagnosis not present

## 2022-12-10 DIAGNOSIS — Z961 Presence of intraocular lens: Secondary | ICD-10-CM

## 2022-12-10 DIAGNOSIS — H40051 Ocular hypertension, right eye: Secondary | ICD-10-CM

## 2022-12-10 DIAGNOSIS — H35033 Hypertensive retinopathy, bilateral: Secondary | ICD-10-CM

## 2022-12-10 DIAGNOSIS — H532 Diplopia: Secondary | ICD-10-CM

## 2022-12-10 MED ORDER — FARICIMAB-SVOA 6 MG/0.05ML IZ SOSY
6.0000 mg | PREFILLED_SYRINGE | INTRAVITREAL | Status: AC | PRN
Start: 2022-12-10 — End: 2022-12-10
  Administered 2022-12-10: 6 mg via INTRAVITREAL

## 2023-01-13 NOTE — Progress Notes (Signed)
[facility] - Clinic Note  01/14/2023     CHIEF COMPLAINT Patient presents for Retina Follow Up   HISTORY OF PRESENT ILLNESS: Juan GONGWER is a 62 y.o. male who presents to the clinic today for:   HPI     Retina Follow Up   Patient presents with  Diabetic Retinopathy.  In both eyes.  This started 5 weeks ago.  Duration of 5 weeks.  Since onset it is stable.        Comments   5 week retina follow up and IVV OU pt is reporting no vision changes noticed he has some floaters denies any flashes pt has been having some watering in OD using systane 2 x in the last 2 week his last reading was 180 last night       Last edited by [name], COT on 01/14/2023  8:53 AM.     Patient states the eyes have been watering a lot.   Referring physician: Laurann Montana, MD [phone] [address] [place],  [place] [address]  HISTORICAL INFORMATION:   Selected notes from the MEDICAL RECORD NUMBER Referred by Dr. Jethro Bolus for concern of CME s/p cataract sx   CURRENT MEDICATIONS: Current Outpatient Medications (Ophthalmic Drugs)  Medication Sig   dorzolamide-timolol (COSOPT) 22.3-6.8 MG/ML ophthalmic solution Place 1 drop into the right eye 2 (two) times daily. (Patient taking differently: Place 1 drop into both eyes 2 (two) times daily.)   No current facility-administered medications for this visit. (Ophthalmic Drugs)   Current Outpatient Medications (Other)  Medication Sig   ADVAIR DISKUS 500-50 MCG/DOSE AEPB Inhale 1 puff into the lungs 2 (two) times daily.   albuterol (VENTOLIN HFA) 108 (90 Base) MCG/ACT inhaler Inhale 1-2 puffs into the lungs every 6 (six) hours as needed for wheezing or shortness of breath.   alfuzosin (UROXATRAL) 10 MG 24 hr tablet TAKE 1 TABLET BY MOUTH EVERYDAY AT BEDTIME   aspirin EC 81 MG tablet Take 81 mg by mouth daily.   atorvastatin (LIPITOR) 40 MG tablet Take 1 tablet (40 mg total) by mouth daily.   carvedilol  (COREG) 12.5 MG tablet Take 1 tablet (12.5 mg total) by mouth 2 (two) times daily with a meal.   Empagliflozin-metFORMIN HCl (SYNJARDY) 12.06-998 MG TABS Take 1 tablet by mouth 2 (two) times a day.    esomeprazole (NEXIUM) 40 MG capsule Take 40 mg by mouth daily at 12 noon.   famotidine (PEPCID) 40 MG tablet Take by mouth.   insulin glargine, 2 Unit Dial, (TOUJEO MAX SOLOSTAR) 300 UNIT/ML Solostar Pen Inject 40 Units into the skin daily.    iron polysaccharides (NIFEREX) 150 MG capsule Take 150 mg by mouth daily.   isosorbide mononitrate (IMDUR) 30 MG 24 hr tablet Take 1 tablet (30 mg total) by mouth daily.   lisinopril (ZESTRIL) 40 MG tablet Take 1 tablet (40 mg total) by mouth daily.   nitroGLYCERIN (NITROSTAT) 0.4 MG SL tablet Place 0.4 mg under the tongue daily.   OZEMPIC, 0.25 OR 0.5 MG/DOSE, 2 MG/3ML SOPN Inject 0.25 mg into the skin once a week.   No current facility-administered medications for this visit. (Other)   REVIEW OF SYSTEMS: ROS   Positive for: Gastrointestinal, Musculoskeletal, Endocrine, Eyes, Respiratory Negative for: Constitutional, Neurological, Skin, Genitourinary, HENT, Cardiovascular, Psychiatric, Allergic/Imm, Heme/Lymph Last edited by [name], COT on 01/14/2023  8:53 AM.        ALLERGIES Allergies  Allergen Reactions  Naproxen Shortness Of Breath   Naproxen Sodium Shortness Of Breath   Tiotropium Bromide Monohydrate Shortness Of Breath   Penicillins Rash    Has patient had a PCN reaction causing immediate rash, facial/tongue/throat swelling, SOB or lightheadedness with hypotension: Yes Has patient had a PCN reaction causing severe rash involving mucus membranes or skin necrosis: No Has patient had a PCN reaction that required hospitalization No Has patient had a PCN reaction occurring within the last 10 years: No If all of the above answers are "NO", then may proceed with Cephalosporin use.  Has patient had a PCN reaction causing  immediate rash, facial/tongue/throat swelling, SOB or lightheadedness with hypotension: Yes Has patient had a PCN reaction causing severe rash involving mucus membranes or skin necrosis: No Has patient had a PCN reaction that required hospitalization No Has patient had a PCN reaction occurring within the last 10 years: No If all of the above answers are "NO", then may proceed with Cephalosporin use.    PAST MEDICAL HISTORY Past Medical History:  Diagnosis Date   Arthritis    Asthma    COPD (chronic obstructive pulmonary disease) (HCC)    Diabetes mellitus    Type 2   Diabetic retinopathy (HCC)    NPDR OU   GERD (gastroesophageal reflux disease)    H/O hiatal hernia    History of kidney stones    Hyperlipemia    Hypertension    Hypertensive retinopathy    OU   Nasal polyps    Pneumonia 2014   Shortness of breath    Sleep apnea    pt has CPAP but is unable to use it d/t having polyps in his nose. Pt stated "I need to call and get a CPAP mask instead"   Stomach ulcer    Past Surgical History:  Procedure Laterality Date   CATARACT EXTRACTION Right 07/05/2018   Dr. Nile Riggs   CATARACT EXTRACTION Left 07/12/2018   Dr. Nile Riggs   CHOLECYSTECTOMY N/A 01/23/2015   Procedure: LAPAROSCOPIC CHOLECYSTECTOMY ;  Surgeon: Violeta Gelinas, MD;  Location: [facility] OR;  Service: General;  Laterality: N/A;   COLONOSCOPY W/ POLYPECTOMY     ESOPHAGOGASTRODUODENOSCOPY     EYE SURGERY     KNEE ARTHROSCOPY  03/24/2011   Procedure: ARTHROSCOPY KNEE;  Surgeon: Harvie Junior, MD;  Location: [facility];  Service: Orthopedics;  Laterality: Right;  partial medial menisectomy, chondroplaty patella, and medial medial plica   LEFT HEART CATH AND CORONARY ANGIOGRAPHY N/A 06/29/2019   Procedure: LEFT HEART CATH AND CORONARY ANGIOGRAPHY;  Surgeon: Iran Ouch, MD;  Location: [facility];  Service: Cardiovascular;  Laterality: N/A;   FAMILY HISTORY Family History  Problem Relation Age of  Onset   Diabetes Father    Coronary artery disease Father    SOCIAL HISTORY Social History   Tobacco Use   Smoking status: Former    Current packs/day: 3.00    Average packs/day: 3.0 packs/day for 20.0 years (60.0 ttl pk-yrs)    Types: Cigarettes   Smokeless tobacco: Current    Types: Chew   Tobacco comments:    1 can/daily  Vaping Use   Vaping status: Never Used  Substance Use Topics   Alcohol use: Yes    Comment: social, 1x weekly   Drug use: No       OPHTHALMIC EXAM:  Base Eye Exam     Visual Acuity (Snellen - Linear)       Right Left   Dist  [place] 20/30 -1 20/60 +1   Dist ph [place] 20/25 -3 NI         Tonometry (Tonopen, 8:58 AM)       Right Left   Pressure 8 13         Pupils       Pupils Dark Light Shape React APD   Right PERRL 3 2 Round Brisk None   Left PERRL 3 2 Round Brisk None         Visual Fields       Left Right    Full Full         Extraocular Movement       Right Left    Full, Ortho Full, Ortho         Neuro/Psych     Oriented x3: Yes   Mood/Affect: Normal         Dilation     Both eyes: 2.5% Phenylephrine @ 8:58 AM           Slit Lamp and Fundus Exam     Slit Lamp Exam       Right Left   Lids/Lashes Dermatochalasis - upper lid, Telangiectasia, mild Meibomian gland dysfunction Dermatochalasis - upper lid, Telangiectasia, mild Meibomian gland dysfunction   Conjunctiva/Sclera White and quiet Mild nasal Pinguecula   Cornea Mild Arcus, Well healed temporal cataract wounds Arcus, Well healed temporal cataract wounds, trace PEE   Anterior Chamber Deep and clear Deep and clear   Iris Round and dilated, mild patch of atrophy at 0800, no NVI Round and dilated, No NVI   Lens PC IOL in good position PC IOL in good position   Anterior Vitreous Vitreous syneresis Vitreous syneresis         Fundus Exam       Right Left   Disc trace pallor, Sharp rim Pallor, Sharp rim, Compact, mild PPP temporal   C/D Ratio 0.3 0.3    Macula Good foveal reflex, scattered IRH/DBH -- greatest temporal macula, trace cystic changes temporal mac -- slightly increased, mild focal laser changes Blunted foveal reflex, +central atrophy and pigment clumping, scattered DBH and Mas -- improved, scattered punctate exudates - greatest superior and temporal macula -- persistent, cystic changes / edema temporal macula -- slightly improved   Vessels attenuated, Tortuous attenuated, Tortuous   Periphery Attached, scattered DBH greatest posteriorly, prominent blot heme along SN arcades Attached, pigmented choroidal nevus superiorly with mild elevation, +drusen, no SRF or orange pigment -- unchanged from prior, scattered MA/DBH           IMAGING AND PROCEDURES  Imaging and Procedures for @TODAY @          ASSESSMENT/PLAN:    ICD-10-CM   1. Severe nonproliferative diabetic retinopathy of both eyes with macular edema associated with type 2 diabetes mellitus (HCC)  E11.3413 OCT, Retina - OU - Both Eyes    2. Current use of insulin (HCC)  Z79.4     3. Long term (current) use of oral hypoglycemic drugs  Z79.84     4. 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 Essential hypertension  I10     6. Hypertensive retinopathy of both eyes  H35.033     7. Pseudophakia of both eyes  Z96.1     8. Ocular hypertension of right eye  H40.051     9. Diplopia  H53.2      **delayed f/u: 7 wks instead of 5 due to moving**  1-3.  Severe nonproliferative diabetic retinopathy with DME, OU (OS>OD)  - A1C 6.3 (07.26.24) - s/p IVA OD #1 (06.26.20), #2 (08.14.20), #3 (09.17.20), #4 (10.16.20), #5 (02.02.22)  - s/p IVA OS #1 (07.17.20), #2 (08.14.20), #3 (09.17.20)  **IVA RESISTANCE OU**  ====================  - s/p focal laser OD (07.18.23) - s/p IVE OD #1 (11.13.20), #2 (12.11.20), #3 (01.18.21), #4 (02.19.21), #5 (03.19.21), #6 (04.16.21), #7 (05.14.21), #8 (06.17.21), #9 (07.16.21), #10 (09.21.21), #11 (10.29.21), #12 (11.29.21), #13 (12.29.21),  #14 (03.09.22), #15 (04.06.22), #16 (05.11.12), #17 (06.15.22), #18 (7.20.22), #19 (08.24.22), #20 (10.04.22), #21 (11.08.22), #22 (12.13.22), #23 (02.21.23), #24 (03.28.23), #25 (05.09.23), #26 (06.20.23), #27 (08.01.23)  ===================== - s/p IVE OS #1 (10.16.20) - sample, #2 (11.13.20), #3 (12.11.20), #4 (01.18.21), #5 (02.19.21), #6 (03.19.21), #7 (04.16.21), #8 (05.14.21), #9 (06.17.21), #10 (7.16.21), #11 (09.21.21), #12 (10.29.21), #13 (11.29.21), #14 (12.29.21), #15 (02.02.22, sample), #16 (03.09.22), #17 (04.06.22), #18 (05.11.2012), #19 (06.15.22), #20 (7.20.22), #21 (08.24.22), #22 (10.4.22), #23 (11.8.22), #24 (12.13.22), #25 (01.17.23), #26 (02.21.23), #27 (03.28.23). #28 (05.09.23), #29 (06.20.23), #30 (08.01.23), #31 (09.12.23), #32 (10.24.23), #33 (12.05.23) -- IVE resistance ==================== -s/p IVV OD #1(11.08.24) - s/p IVV OS #1 (01.16.24), #2 (02.20.24), #3 (03.26.24), #4 (04.30.24), #5 (06.04.24), #6 (07.09.24), #7 (08.14.24), #8 (10.03.24) #9(11.08.24) - FA (06.26.20) shows Severe NPDR OU w/ Late leaking MA OU, Enlarged FAZ OU, No NV OU, No significant hyperfluorescence of disc  - BCVA OD 20/25; OS stable at 20/60 - OCT shows OD: OD: Interval improvement in IRF/IRHM  ST macula, OS: Persistent IRF/IRHM ST fovea and mac -- slightly improved: persistent central ORA; Sub-RPE hyper-reflective mass consistent with choroidal nevus -- superior to disc, caught on widefield -- stable from prior at 5 weeks  - recommend IVV OU (OD #2 and OS #10) today, 12.13.24 w/ f/u at 5 wks  - pt in agreement  - RBA of procedure discussed, questions answered  - see procedure note  - Vabysmo informed consent form signed and scanned, 11.08.24 (OU)  - Vabysmo auth approved for 2024  - insurance will only approve one med at a time -- currently approved for Vabysmo  - f/u 5 weeks, DFE, OCT, possible injections  4. Choroidal Nevus OS  - located at 1200, mild elevation, +drusen, no SRF  -  baseline optos pictures obtained, 06.26.20  - discussed possible referral to oncology at [facility] in [place]  5,6. Hypertensive retinopathy OU  - discussed importance of tight BP control  - monitor  7. Pseudophakia OU  - s/p CE/IOL OU (OD on 06.03.20 and OS 06.10.20 by Dr. Nile Riggs)  - beautiful surgeries w/ IOLs in excellent position  - macular edema limiting vision as above  - monitor  8. Ocular hypertension   - IOP 8, 13 OU  - cont Cosopt qd OU  - monitor  9. Intermittent diplopia - pt reports intermittent episodes of diplopia with both vertical and horizontal components; no rotational - pt had appt with Dr. Ernesto Rutherford on 11.9.23 -- noted some improvement with prism but did not recommend getting the prism at this time  - pt feels diplopia is improved -- episodes less frequent   Ophthalmic Meds Ordered this visit:  No orders of the defined types were placed in this encounter.    No follow-ups on file.  There are no Patient Instructions on file for this visit. This document serves as a record of services personally performed by Karie Chimera, MD, PhD. It was created on their behalf by Berlin Hun COT, an  ophthalmic technician. The creation of this record is the provider's dictation and/or activities during the visit.    Electronically signed by: Berlin Hun COT 12.12.24  9:39 AM  This document serves as a record of services personally performed by Karie Chimera, MD, PhD. It was created on their behalf by Charlette Caffey, COT an ophthalmic technician. The creation of this record is the provider's dictation and/or activities during the visit.    Electronically signed by:  Charlette Caffey, COT  01/14/23 9:41 AM   Abbreviations: M myopia (nearsighted); A astigmatism; H hyperopia (farsighted); P presbyopia; Mrx spectacle prescription;  CTL contact lenses; OD right eye; OS left eye; OU both eyes  XT exotropia; ET esotropia; PEK punctate epithelial  keratitis; PEE punctate epithelial erosions; DES dry eye syndrome; MGD meibomian gland dysfunction; ATs artificial tears; PFAT's preservative free artificial tears; NSC nuclear sclerotic cataract; PSC posterior subcapsular cataract; ERM epi-retinal membrane; PVD posterior vitreous detachment; RD retinal detachment; DM diabetes mellitus; DR diabetic retinopathy; NPDR non-proliferative diabetic retinopathy; PDR proliferative diabetic retinopathy; CSME clinically significant macular edema; DME diabetic macular edema; dbh dot blot hemorrhages; CWS cotton wool spot; POAG primary open angle glaucoma; C/D cup-to-disc ratio; HVF humphrey visual field; GVF goldmann visual field; OCT optical coherence tomography; IOP intraocular pressure; BRVO Branch retinal vein occlusion; CRVO central retinal vein occlusion; CRAO central retinal artery occlusion; BRAO branch retinal artery occlusion; RT retinal tear; SB scleral buckle; PPV pars plana vitrectomy; VH Vitreous hemorrhage; PRP panretinal laser photocoagulation; IVK intravitreal kenalog; VMT vitreomacular traction; MH Macular hole;  NVD neovascularization of the disc; NVE neovascularization elsewhere; AREDS age related eye disease study; ARMD age related macular degeneration; POAG primary open angle glaucoma; EBMD epithelial/anterior basement membrane dystrophy; ACIOL anterior chamber intraocular lens; IOL intraocular lens; PCIOL posterior chamber intraocular lens; Phaco/IOL phacoemulsification with intraocular lens placement; PRK photorefractive keratectomy; LASIK laser assisted in situ keratomileusis; HTN hypertension; DM diabetes mellitus; COPD chronic obstructive pulmonary disease

## 2023-01-14 ENCOUNTER — Encounter (INDEPENDENT_AMBULATORY_CARE_PROVIDER_SITE_OTHER): Payer: Self-pay | Admitting: Ophthalmology

## 2023-01-14 ENCOUNTER — Ambulatory Visit (INDEPENDENT_AMBULATORY_CARE_PROVIDER_SITE_OTHER): Payer: BC Managed Care – PPO | Admitting: Ophthalmology

## 2023-01-14 DIAGNOSIS — H532 Diplopia: Secondary | ICD-10-CM

## 2023-01-14 DIAGNOSIS — H35033 Hypertensive retinopathy, bilateral: Secondary | ICD-10-CM

## 2023-01-14 DIAGNOSIS — Z794 Long term (current) use of insulin: Secondary | ICD-10-CM | POA: Diagnosis not present

## 2023-01-14 DIAGNOSIS — I10 Essential (primary) hypertension: Secondary | ICD-10-CM

## 2023-01-14 DIAGNOSIS — Z7984 Long term (current) use of oral hypoglycemic drugs: Secondary | ICD-10-CM

## 2023-01-14 DIAGNOSIS — H40051 Ocular hypertension, right eye: Secondary | ICD-10-CM

## 2023-01-14 DIAGNOSIS — E113413 Type 2 diabetes mellitus with severe nonproliferative diabetic retinopathy with macular edema, bilateral: Secondary | ICD-10-CM | POA: Diagnosis not present

## 2023-01-14 DIAGNOSIS — D3132 Benign neoplasm of left choroid: Secondary | ICD-10-CM

## 2023-01-14 DIAGNOSIS — Z961 Presence of intraocular lens: Secondary | ICD-10-CM

## 2023-01-14 MED ORDER — FARICIMAB-SVOA 6 MG/0.05ML IZ SOSY
6.0000 mg | PREFILLED_SYRINGE | INTRAVITREAL | Status: AC | PRN
  Administered 2023-01-14: 6 mg via INTRAVITREAL

## 2023-02-14 DIAGNOSIS — J209 Acute bronchitis, unspecified: Secondary | ICD-10-CM | POA: Diagnosis not present

## 2023-02-14 DIAGNOSIS — R058 Other specified cough: Secondary | ICD-10-CM | POA: Diagnosis not present

## 2023-02-14 DIAGNOSIS — Z8709 Personal history of other diseases of the respiratory system: Secondary | ICD-10-CM | POA: Diagnosis not present

## 2023-02-14 DIAGNOSIS — J329 Chronic sinusitis, unspecified: Secondary | ICD-10-CM | POA: Diagnosis not present

## 2023-02-17 NOTE — Progress Notes (Signed)
[facility] - Clinic Note  02/18/2023     CHIEF COMPLAINT Patient presents for Retina Follow Up   HISTORY OF PRESENT ILLNESS: Juan [name] is a 63 y.o. male who presents to the clinic today for:   HPI     Retina Follow Up   Patient presents with  Diabetic Retinopathy.  In both eyes.  This started 5 weeks ago.  Duration of 5 weeks.  Since onset it is stable.  I, the attending physician,  performed the HPI with the patient and updated documentation appropriately.        Comments   5 week retina follow up NPDR and  IVV OU pt is reporting no vision changes noticed he denies flashes or floaters pt last reading 141 pt it taking pred due to having bronchitis Cosopt qd OU pt has not been using them, for at least 6 month       Last edited by Rennis Chris, MD on 02/18/2023 12:28 PM.    Patient states his vision is the same, he has bronchitis and is on doxycycline and prednisone  Referring physician: Laurann Montana, MD [phone] [address] [place],  [place] [address]  HISTORICAL INFORMATION:   Selected notes from the MEDICAL RECORD NUMBER Referred by Dr. Jethro Bolus for concern of CME s/p cataract sx   CURRENT MEDICATIONS: Current Outpatient Medications (Ophthalmic Drugs)  Medication Sig   dorzolamide-timolol (COSOPT) 22.3-6.8 MG/ML ophthalmic solution Place 1 drop into the right eye 2 (two) times daily. (Patient taking differently: Place 1 drop into both eyes 2 (two) times daily.)   No current facility-administered medications for this visit. (Ophthalmic Drugs)   Current Outpatient Medications (Other)  Medication Sig   ADVAIR DISKUS 500-50 MCG/DOSE AEPB Inhale 1 puff into the lungs 2 (two) times daily.   albuterol (VENTOLIN HFA) 108 (90 Base) MCG/ACT inhaler Inhale 1-2 puffs into the lungs every 6 (six) hours as needed for wheezing or shortness of breath.   alfuzosin (UROXATRAL) 10 MG 24 hr tablet TAKE 1 TABLET BY MOUTH EVERYDAY AT BEDTIME    aspirin EC 81 MG tablet Take 81 mg by mouth daily.   atorvastatin (LIPITOR) 40 MG tablet Take 1 tablet (40 mg total) by mouth daily.   carvedilol (COREG) 12.5 MG tablet Take 1 tablet (12.5 mg total) by mouth 2 (two) times daily with a meal.   Empagliflozin-metFORMIN HCl (SYNJARDY) 12.06-998 MG TABS Take 1 tablet by mouth 2 (two) times a day.    esomeprazole (NEXIUM) 40 MG capsule Take 40 mg by mouth daily at 12 noon.   famotidine (PEPCID) 40 MG tablet Take by mouth.   insulin glargine, 2 Unit Dial, (TOUJEO MAX SOLOSTAR) 300 UNIT/ML Solostar Pen Inject 40 Units into the skin daily.    iron polysaccharides (NIFEREX) 150 MG capsule Take 150 mg by mouth daily.   isosorbide mononitrate (IMDUR) 30 MG 24 hr tablet Take 1 tablet (30 mg total) by mouth daily.   lisinopril (ZESTRIL) 40 MG tablet Take 1 tablet (40 mg total) by mouth daily.   nitroGLYCERIN (NITROSTAT) 0.4 MG SL tablet Place 0.4 mg under the tongue daily.   OZEMPIC, 0.25 OR 0.5 MG/DOSE, 2 MG/3ML SOPN Inject 0.25 mg into the skin once a week.   No current facility-administered medications for this visit. (Other)   REVIEW OF SYSTEMS: ROS   Positive for: Gastrointestinal, Musculoskeletal, Endocrine, Eyes, Respiratory Negative for: Constitutional, Neurological, Skin, Genitourinary, HENT, Cardiovascular, Psychiatric, Allergic/Imm, Heme/Lymph Last edited by Patricia Pesa,  Garner Gavel, COT on 02/18/2023  9:09 AM.      ALLERGIES Allergies  Allergen Reactions   Naproxen Shortness Of Breath   Naproxen Sodium Shortness Of Breath   Tiotropium Bromide Monohydrate Shortness Of Breath   Penicillins Rash    Has patient had a PCN reaction causing immediate rash, facial/tongue/throat swelling, SOB or lightheadedness with hypotension: Yes Has patient had a PCN reaction causing severe rash involving mucus membranes or skin necrosis: No Has patient had a PCN reaction that required hospitalization No Has patient had a PCN reaction occurring within the last  10 years: No If all of the above answers are "NO", then may proceed with Cephalosporin use.  Has patient had a PCN reaction causing immediate rash, facial/tongue/throat swelling, SOB or lightheadedness with hypotension: Yes Has patient had a PCN reaction causing severe rash involving mucus membranes or skin necrosis: No Has patient had a PCN reaction that required hospitalization No Has patient had a PCN reaction occurring within the last 10 years: No If all of the above answers are "NO", then may proceed with Cephalosporin use.    PAST MEDICAL HISTORY Past Medical History:  Diagnosis Date   Arthritis    Asthma    COPD (chronic obstructive pulmonary disease) (HCC)    Diabetes mellitus    Type 2   Diabetic retinopathy (HCC)    NPDR OU   GERD (gastroesophageal reflux disease)    H/O hiatal hernia    History of kidney stones    Hyperlipemia    Hypertension    Hypertensive retinopathy    OU   Nasal polyps    Pneumonia 2014   Shortness of breath    Sleep apnea    pt has CPAP but is unable to use it d/t having polyps in his nose. Pt stated "I need to call and get a CPAP mask instead"   Stomach ulcer    Past Surgical History:  Procedure Laterality Date   CATARACT EXTRACTION Right 07/05/2018   Dr. Nile Riggs   CATARACT EXTRACTION Left 07/12/2018   Dr. Nile Riggs   CHOLECYSTECTOMY N/A 01/23/2015   Procedure: LAPAROSCOPIC CHOLECYSTECTOMY ;  Surgeon: Violeta Gelinas, MD;  Location: [facility] OR;  Service: General;  Laterality: N/A;   COLONOSCOPY W/ POLYPECTOMY     ESOPHAGOGASTRODUODENOSCOPY     EYE SURGERY     KNEE ARTHROSCOPY  03/24/2011   Procedure: ARTHROSCOPY KNEE;  Surgeon: Harvie Junior, MD;  Location: [facility];  Service: Orthopedics;  Laterality: Right;  partial medial menisectomy, chondroplaty patella, and medial medial plica   LEFT HEART CATH AND CORONARY ANGIOGRAPHY N/A 06/29/2019   Procedure: LEFT HEART CATH AND CORONARY ANGIOGRAPHY;  Surgeon: Iran Ouch, MD;   Location: [facility]AS[facility];  Service: Cardiovascular;  Laterality: N/A;   FAMILY HISTORY Family History  Problem Relation Age of Onset   Diabetes Father    Coronary artery disease Father    SOCIAL HISTORY Social History   Tobacco Use   Smoking status: Former    Current packs/day: 3.00    Average packs/day: 3.0 packs/day for 20.0 years (60.0 ttl pk-yrs)    Types: Cigarettes   Smokeless tobacco: Current    Types: Chew   Tobacco comments:    1 can/daily  Vaping Use   Vaping status: Never Used  Substance Use Topics   Alcohol use: Yes    Comment: social, 1x weekly   Drug use: No       OPHTHALMIC EXAM:  Base Eye Exam  Visual Acuity (Snellen - Linear)       Right Left   Dist [place] 20/30 20/60   Dist ph [place] 20/25 NI         Tonometry (Tonopen, 9:18 AM)       Right Left   Pressure 14 13         Pupils       Pupils Dark Light Shape React APD   Right PERRL 3 2 Round Brisk None   Left PERRL 3 2 Round Brisk None         Visual Fields       Left Right    Full Full         Extraocular Movement       Right Left    Full, Ortho Full, Ortho         Neuro/Psych     Oriented x3: Yes   Mood/Affect: Normal         Dilation     Both eyes: 2.5% Phenylephrine @ 9:18 AM           Slit Lamp and Fundus Exam     Slit Lamp Exam       Right Left   Lids/Lashes Dermatochalasis - upper lid, Telangiectasia, mild Meibomian gland dysfunction Dermatochalasis - upper lid, Telangiectasia, mild Meibomian gland dysfunction   Conjunctiva/Sclera White and quiet Mild nasal Pinguecula   Cornea Mild Arcus, Well healed temporal cataract wounds Arcus, Well healed temporal cataract wounds, trace PEE   Anterior Chamber Deep and clear Deep and clear   Iris Round and dilated, mild patch of atrophy at 0800, no NVI Round and dilated, No NVI   Lens PC IOL in good position PC IOL in good position   Anterior Vitreous Vitreous syneresis Vitreous syneresis          Fundus Exam       Right Left   Disc trace pallor, Sharp rim Pallor, Sharp rim, Compact, mild PPP temporal   C/D Ratio 0.3 0.3   Macula Good foveal reflex, scattered IRH/DBH -- greatest temporal macula, trace cystic changes temporal mac -- slightly improved, mild focal laser changes Blunted foveal reflex, +central atrophy and pigment clumping, scattered DBH and Mas -- improved, scattered punctate exudates - greatest superior and temporal macula -- persistent, cystic changes / edema temporal macula -- slightly improved   Vessels attenuated, Tortuous attenuated, Tortuous   Periphery Attached, scattered DBH greatest posteriorly, prominent blot heme along SN arcades Attached, pigmented choroidal nevus superiorly with mild elevation, +drusen, no SRF or orange pigment -- unchanged from prior, scattered MA/DBH           IMAGING AND PROCEDURES  Imaging and Procedures for @TODAY @  OCT, Retina - OU - Both Eyes       Right Eye Quality was good. Central Foveal Thickness: 232. Progression has improved. Findings include normal foveal contour, no SRF, intraretinal hyper-reflective material, intraretinal fluid, outer retinal atrophy, vitreomacular adhesion (Interval improvement in IRF/IRHM ST macula ).   Left Eye Quality was good. Central Foveal Thickness: 229. Progression has improved. Findings include normal foveal contour, no SRF, intraretinal hyper-reflective material, intraretinal fluid, outer retinal atrophy (Persistent IRF/IRHM ST fovea and mac -- slightly improved: persistent central ORA; Sub-RPE hyper-reflective mass consistent with choroidal nevus -- superior to disc, caught on widefield -- stable from prior).   Notes *Images captured and stored on drive  Diagnosis / Impression: +DME OU OD: Interval improvement in IRF/IRHM  ST macula  OS: Persistent IRF/IRHM ST  fovea and mac -- slightly improved: persistent central ORA; Sub-RPE hyper-reflective mass consistent with choroidal nevus --  superior to disc, caught on widefield -- stable from prior  Clinical management:  See below  Abbreviations: NFP - Normal foveal profile. CME - cystoid macular edema. PED - pigment epithelial detachment. IRF - intraretinal fluid. SRF - subretinal fluid. EZ - ellipsoid zone. ERM - epiretinal membrane. ORA - outer retinal atrophy. ORT - outer retinal tubulation. SRHM - subretinal hyper-reflective material      Intravitreal Injection, Pharmacologic Agent - OD - Right Eye       Time Out 02/18/2023. 10:05 AM. Confirmed correct patient, procedure, site, and patient consented.   Anesthesia Topical anesthesia was used. Anesthetic medications included Lidocaine 2%, Proparacaine 0.5%.   Procedure Preparation included 5% betadine to ocular surface, eyelid speculum. A supplied (32g) needle was used.   Injection: 6 mg faricimab-svoa 6 MG/0.05ML   Route: Intravitreal, Site: Right Eye   NDC: R2083049, Lot: [id number], Expiration date: 05/31/2024, Waste: 0 mL   Post-op Post injection exam found visual acuity of at least counting fingers. The patient tolerated the procedure well. There were no complications. The patient received written and verbal post procedure care education.      Intravitreal Injection, Pharmacologic Agent - OS - Left Eye       Time Out 02/18/2023. 10:06 AM. Confirmed correct patient, procedure, site, and patient consented.   Anesthesia Topical anesthesia was used. Anesthetic medications included Lidocaine 2%, Proparacaine 0.5%.   Procedure Preparation included 5% betadine to ocular surface, eyelid speculum. A supplied (32g) needle was used.   Injection: 6 mg faricimab-svoa 6 MG/0.05ML   Route: Intravitreal, Site: Left Eye   NDC: 95638-756-43, Lot: [id number], Expiration date: 05/01/2024, Waste: 0 mL   Post-op Post injection exam found visual acuity of at least counting fingers. The patient tolerated the procedure well. There were no complications. The patient received  written and verbal post procedure care education. Post injection medications were not given.            ASSESSMENT/PLAN:    ICD-10-CM   1. Severe nonproliferative diabetic retinopathy of both eyes with macular edema associated with type 2 diabetes mellitus (HCC)  E11.3413 OCT, Retina - OU - Both Eyes    Intravitreal Injection, Pharmacologic Agent - OD - Right Eye    Intravitreal Injection, Pharmacologic Agent - OS - Left Eye    faricimab-svoa (VABYSMO) 6mg /0.38mL intravitreal injection    faricimab-svoa (VABYSMO) 6mg /0.55mL intravitreal injection    2. Current use of insulin (HCC)  Z79.4     3. Long term (current) use of oral hypoglycemic drugs  Z79.84     4. 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 Essential hypertension  I10     6. Hypertensive retinopathy of both eyes  H35.033     7. Pseudophakia of both eyes  Z96.1     8. Ocular hypertension of right eye  H40.051     9. Diplopia  H53.2      1-3. Severe nonproliferative diabetic retinopathy with DME, OU (OS>OD)  - A1C 6.3 (07.26.24) - s/p IVA OD #1 (06.26.20), #2 (08.14.20), #3 (09.17.20), #4 (10.16.20), #5 (02.02.22)  - s/p IVA OS #1 (07.17.20), #2 (08.14.20), #3 (09.17.20)  **IVA RESISTANCE OU**  ====================  - s/p focal laser OD (07.18.23) - s/p IVE OD #1 (11.13.20), #2 (12.11.20), #3 (01.18.21), #4 (02.19.21), #5 (03.19.21), #6 (04.16.21), #7 (05.14.21), #8 (06.17.21), #9 (07.16.21), #10 (09.21.21), #11 (10.29.21), #12 (  11.29.21), #13 (12.29.21), #14 (03.09.22), #15 (04.06.22), #16 (05.11.12), #17 (06.15.22), #18 (7.20.22), #19 (08.24.22), #20 (10.04.22), #21 (11.08.22), #22 (12.13.22), #23 (02.21.23), #24 (03.28.23), #25 (05.09.23), #26 (06.20.23), #27 (08.01.23)  ===================== - s/p IVE OS #1 (10.16.20) - sample, #2 (11.13.20), #3 (12.11.20), #4 (01.18.21), #5 (02.19.21), #6 (03.19.21), #7 (04.16.21), #8 (05.14.21), #9 (06.17.21), #10 (7.16.21), #11 (09.21.21), #12 (10.29.21), #13 (11.29.21), #14  (12.29.21), #15 (02.02.22, sample), #16 (03.09.22), #17 (04.06.22), #18 (05.11.2012), #19 (06.15.22), #20 (7.20.22), #21 (08.24.22), #22 (10.4.22), #23 (11.8.22), #24 (12.13.22), #25 (01.17.23), #26 (02.21.23), #27 (03.28.23). #28 (05.09.23), #29 (06.20.23), #30 (08.01.23), #31 (09.12.23), #32 (10.24.23), #33 (12.05.23) -- IVE resistance ==================== - s/p IVV OD #1 (11.08.24), #2 (12.13.24), #3 (12.13.24) - s/p IVV OS #1 (01.16.24), #2 (02.20.24), #3 (03.26.24), #4 (04.30.24), #5 (06.04.24), #6 (07.09.24), #7 (08.14.24), #8 (10.03.24), #9 (11.08.24), #10 (12.13.24), #11 (12.13.24) - FA (06.26.20) shows Severe NPDR OU w/ Late leaking MA OU, Enlarged FAZ OU, No NV OU, No significant hyperfluorescence of disc  - BCVA OD 20/25; OS 20/60  - OCT shows OD: Interval improvement in IRF/IRHM  ST macula, OS: Persistent IRF/IRHM ST fovea and mac -- slightly improved: persistent central ORA; Sub-RPE hyper-reflective mass consistent with choroidal nevus -- superior to disc, caught on widefield -- stable from prior at 5 weeks  - recommend IVV OU (OD #4 and OS #12) today, 01.17.25 w/ f/u at 5 wks  - pt in agreement  - RBA of procedure discussed, questions answered  - see procedure note  - Vabysmo informed consent form signed and scanned, 11.08.24 (OU)  - Vabysmo auth approved for 2025  - insurance will only approve one med at a time -- currently approved for Vabysmo  - f/u 5 weeks, DFE, OCT, possible injections  4. Choroidal Nevus OS  - located at 1200, mild elevation, +drusen, no SRF  - baseline optos pictures obtained, 06.26.20  - discussed possible referral to oncology at [facility] in [place]  5,6. Hypertensive retinopathy OU  - discussed importance of tight BP control  - monitor  7. Pseudophakia OU  - s/p CE/IOL OU (OD on 06.03.20 and OS 06.10.20 by Dr. Nile Riggs)  - beautiful surgeries w/ IOLs in excellent position  - macular edema limiting vision as above  - monitor  8. Ocular  hypertension   - IOP 14,13  - cont Cosopt qd OU  - monitor  9. Intermittent diplopia - pt reports intermittent episodes of diplopia with both vertical and horizontal components; no rotational - pt had appt with Dr. Ernesto Rutherford on 11.9.23 -- noted some improvement with prism but did not recommend getting the prism at this time  - pt feels diplopia is improved -- episodes less frequent   Ophthalmic Meds Ordered this visit:  Meds ordered this encounter  Medications   faricimab-svoa (VABYSMO) 6mg /0.52mL intravitreal injection   faricimab-svoa (VABYSMO) 6mg /0.24mL intravitreal injection     Return in about 5 weeks (around 03/25/2023) for f/u NPDR OU, DFE, OCT.  There are no Patient Instructions on file for this visit.  This document serves as a record of services personally performed by Karie Chimera, MD, PhD. It was created on their behalf by Berlin Hun COT, an ophthalmic technician. The creation of this record is the provider's dictation and/or activities during the visit.    Electronically signed by: Berlin Hun COT 01.16.25 1:49 AM  This document serves as a record of services personally performed by Karie Chimera, MD, PhD. It was created on their behalf  by Glee Arvin. Manson Passey, OA an ophthalmic technician. The creation of this record is the provider's dictation and/or activities during the visit.    Electronically signed by: Glee Arvin. Manson Passey, OA 02/20/23 1:49 AM  Karie Chimera, M.D., Ph.D. Diseases & Surgery of the Retina and Vitreous [facility] 02/18/2023   I have reviewed the above documentation for accuracy and completeness, and I agree with the above. Karie Chimera, M.D., Ph.D. 02/20/23 1:56 AM   Abbreviations: M myopia (nearsighted); A astigmatism; H hyperopia (farsighted); P presbyopia; Mrx spectacle prescription;  CTL contact lenses; OD right eye; OS left eye; OU both eyes  XT exotropia; ET esotropia; PEK punctate epithelial  keratitis; PEE punctate epithelial erosions; DES dry eye syndrome; MGD meibomian gland dysfunction; ATs artificial tears; PFAT's preservative free artificial tears; NSC nuclear sclerotic cataract; PSC posterior subcapsular cataract; ERM epi-retinal membrane; PVD posterior vitreous detachment; RD retinal detachment; DM diabetes mellitus; DR diabetic retinopathy; NPDR non-proliferative diabetic retinopathy; PDR proliferative diabetic retinopathy; CSME clinically significant macular edema; DME diabetic macular edema; dbh dot blot hemorrhages; CWS cotton wool spot; POAG primary open angle glaucoma; C/D cup-to-disc ratio; HVF humphrey visual field; GVF goldmann visual field; OCT optical coherence tomography; IOP intraocular pressure; BRVO Branch retinal vein occlusion; CRVO central retinal vein occlusion; CRAO central retinal artery occlusion; BRAO branch retinal artery occlusion; RT retinal tear; SB scleral buckle; PPV pars plana vitrectomy; VH Vitreous hemorrhage; PRP panretinal laser photocoagulation; IVK intravitreal kenalog; VMT vitreomacular traction; MH Macular hole;  NVD neovascularization of the disc; NVE neovascularization elsewhere; AREDS age related eye disease study; ARMD age related macular degeneration; POAG primary open angle glaucoma; EBMD epithelial/anterior basement membrane dystrophy; ACIOL anterior chamber intraocular lens; IOL intraocular lens; PCIOL posterior chamber intraocular lens; Phaco/IOL phacoemulsification with intraocular lens placement; PRK photorefractive keratectomy; LASIK laser assisted in situ keratomileusis; HTN hypertension; DM diabetes mellitus; COPD chronic obstructive pulmonary disease

## 2023-02-18 ENCOUNTER — Encounter (INDEPENDENT_AMBULATORY_CARE_PROVIDER_SITE_OTHER): Payer: Self-pay | Admitting: Ophthalmology

## 2023-02-18 ENCOUNTER — Ambulatory Visit (INDEPENDENT_AMBULATORY_CARE_PROVIDER_SITE_OTHER): Payer: BC Managed Care – PPO | Admitting: Ophthalmology

## 2023-02-18 DIAGNOSIS — E113413 Type 2 diabetes mellitus with severe nonproliferative diabetic retinopathy with macular edema, bilateral: Secondary | ICD-10-CM

## 2023-02-18 DIAGNOSIS — I10 Essential (primary) hypertension: Secondary | ICD-10-CM

## 2023-02-18 DIAGNOSIS — D3132 Benign neoplasm of left choroid: Secondary | ICD-10-CM | POA: Diagnosis not present

## 2023-02-18 DIAGNOSIS — Z794 Long term (current) use of insulin: Secondary | ICD-10-CM

## 2023-02-18 DIAGNOSIS — H40051 Ocular hypertension, right eye: Secondary | ICD-10-CM

## 2023-02-18 DIAGNOSIS — Z961 Presence of intraocular lens: Secondary | ICD-10-CM

## 2023-02-18 DIAGNOSIS — H532 Diplopia: Secondary | ICD-10-CM

## 2023-02-18 DIAGNOSIS — Z7984 Long term (current) use of oral hypoglycemic drugs: Secondary | ICD-10-CM | POA: Diagnosis not present

## 2023-02-18 DIAGNOSIS — H35033 Hypertensive retinopathy, bilateral: Secondary | ICD-10-CM

## 2023-02-18 MED ORDER — FARICIMAB-SVOA 6 MG/0.05ML IZ SOSY
6.0000 mg | PREFILLED_SYRINGE | INTRAVITREAL | Status: AC | PRN
  Administered 2023-02-18: 6 mg via INTRAVITREAL

## 2023-02-24 DIAGNOSIS — Y92009 Unspecified place in unspecified non-institutional (private) residence as the place of occurrence of the external cause: Secondary | ICD-10-CM | POA: Diagnosis not present

## 2023-02-24 DIAGNOSIS — M25512 Pain in left shoulder: Secondary | ICD-10-CM | POA: Diagnosis not present

## 2023-02-24 DIAGNOSIS — W19XXXA Unspecified fall, initial encounter: Secondary | ICD-10-CM | POA: Diagnosis not present

## 2023-02-25 DIAGNOSIS — I10 Essential (primary) hypertension: Secondary | ICD-10-CM | POA: Diagnosis not present

## 2023-02-25 DIAGNOSIS — D509 Iron deficiency anemia, unspecified: Secondary | ICD-10-CM | POA: Diagnosis not present

## 2023-02-25 DIAGNOSIS — E113413 Type 2 diabetes mellitus with severe nonproliferative diabetic retinopathy with macular edema, bilateral: Secondary | ICD-10-CM | POA: Diagnosis not present

## 2023-02-25 DIAGNOSIS — Z0000 Encounter for general adult medical examination without abnormal findings: Secondary | ICD-10-CM | POA: Diagnosis not present

## 2023-02-25 DIAGNOSIS — E785 Hyperlipidemia, unspecified: Secondary | ICD-10-CM | POA: Diagnosis not present

## 2023-02-25 DIAGNOSIS — N401 Enlarged prostate with lower urinary tract symptoms: Secondary | ICD-10-CM | POA: Diagnosis not present

## 2023-02-25 DIAGNOSIS — Z23 Encounter for immunization: Secondary | ICD-10-CM | POA: Diagnosis not present

## 2023-02-25 DIAGNOSIS — Z125 Encounter for screening for malignant neoplasm of prostate: Secondary | ICD-10-CM | POA: Diagnosis not present

## 2023-02-25 DIAGNOSIS — I2510 Atherosclerotic heart disease of native coronary artery without angina pectoris: Secondary | ICD-10-CM | POA: Diagnosis not present

## 2023-03-04 DIAGNOSIS — Z794 Long term (current) use of insulin: Secondary | ICD-10-CM | POA: Diagnosis not present

## 2023-03-04 DIAGNOSIS — E113413 Type 2 diabetes mellitus with severe nonproliferative diabetic retinopathy with macular edema, bilateral: Secondary | ICD-10-CM | POA: Diagnosis not present

## 2023-03-04 DIAGNOSIS — E1129 Type 2 diabetes mellitus with other diabetic kidney complication: Secondary | ICD-10-CM | POA: Diagnosis not present

## 2023-03-04 DIAGNOSIS — M542 Cervicalgia: Secondary | ICD-10-CM | POA: Diagnosis not present

## 2023-03-04 DIAGNOSIS — E1140 Type 2 diabetes mellitus with diabetic neuropathy, unspecified: Secondary | ICD-10-CM | POA: Diagnosis not present

## 2023-03-04 DIAGNOSIS — I152 Hypertension secondary to endocrine disorders: Secondary | ICD-10-CM | POA: Diagnosis not present

## 2023-03-04 DIAGNOSIS — M25512 Pain in left shoulder: Secondary | ICD-10-CM | POA: Diagnosis not present

## 2023-03-04 DIAGNOSIS — E1159 Type 2 diabetes mellitus with other circulatory complications: Secondary | ICD-10-CM | POA: Diagnosis not present

## 2023-03-04 DIAGNOSIS — E1169 Type 2 diabetes mellitus with other specified complication: Secondary | ICD-10-CM | POA: Diagnosis not present

## 2023-03-09 DIAGNOSIS — M25512 Pain in left shoulder: Secondary | ICD-10-CM | POA: Diagnosis not present

## 2023-03-22 NOTE — Progress Notes (Signed)
[facility] - Clinic Note  03/25/2023     CHIEF COMPLAINT Patient presents for Retina Follow Up   HISTORY OF PRESENT ILLNESS: Juan [name] is a 63 y.o. male who presents to the clinic today for:   HPI     Retina Follow Up   Patient presents with  Diabetic Retinopathy.  In both eyes.  This started 5 weeks ago.  I, the attending physician,  performed the HPI with the patient and updated documentation appropriately.        Comments   Patient here for 5 weeks retina follow up for NPDR OU. Patient states vision about the same. No eye pain. Not using drops.       Last edited by Rennis Chris, MD on 03/25/2023  4:51 PM.    Patient feels the vision is holding steady. Patient states that he is having surgery in 5 weeks on his left shoulder for torn rotator cuff. He fell off a retention wall.   Referring physician: Laurann Montana, MD [phone] [address] [place],  [place] [address]  HISTORICAL INFORMATION:   Selected notes from the MEDICAL RECORD NUMBER Referred by Dr. Jethro Bolus for concern of CME s/p cataract sx   CURRENT MEDICATIONS: Current Outpatient Medications (Ophthalmic Drugs)  Medication Sig   dorzolamide-timolol (COSOPT) 22.3-6.8 MG/ML ophthalmic solution Place 1 drop into the right eye 2 (two) times daily. (Patient taking differently: Place 1 drop into both eyes 2 (two) times daily.)   No current facility-administered medications for this visit. (Ophthalmic Drugs)   Current Outpatient Medications (Other)  Medication Sig   ADVAIR DISKUS 500-50 MCG/DOSE AEPB Inhale 1 puff into the lungs 2 (two) times daily.   albuterol (VENTOLIN HFA) 108 (90 Base) MCG/ACT inhaler Inhale 1-2 puffs into the lungs every 6 (six) hours as needed for wheezing or shortness of breath.   alfuzosin (UROXATRAL) 10 MG 24 hr tablet TAKE 1 TABLET BY MOUTH EVERYDAY AT BEDTIME   aspirin EC 81 MG tablet Take 81 mg by mouth daily.   atorvastatin (LIPITOR) 40 MG tablet  Take 1 tablet (40 mg total) by mouth daily.   carvedilol (COREG) 12.5 MG tablet Take 1 tablet (12.5 mg total) by mouth 2 (two) times daily with a meal.   Empagliflozin-metFORMIN HCl (SYNJARDY) 12.06-998 MG TABS Take 1 tablet by mouth 2 (two) times a day.    esomeprazole (NEXIUM) 40 MG capsule Take 40 mg by mouth daily at 12 noon.   famotidine (PEPCID) 40 MG tablet Take by mouth.   insulin glargine, 2 Unit Dial, (TOUJEO MAX SOLOSTAR) 300 UNIT/ML Solostar Pen Inject 40 Units into the skin daily.    iron polysaccharides (NIFEREX) 150 MG capsule Take 150 mg by mouth daily.   isosorbide mononitrate (IMDUR) 30 MG 24 hr tablet Take 1 tablet (30 mg total) by mouth daily.   lisinopril (ZESTRIL) 40 MG tablet Take 1 tablet (40 mg total) by mouth daily.   nitroGLYCERIN (NITROSTAT) 0.4 MG SL tablet Place 0.4 mg under the tongue daily.   OZEMPIC, 0.25 OR 0.5 MG/DOSE, 2 MG/3ML SOPN Inject 0.25 mg into the skin once a week.   No current facility-administered medications for this visit. (Other)   REVIEW OF SYSTEMS: ROS   Positive for: Gastrointestinal, Musculoskeletal, Endocrine, Eyes, Respiratory Negative for: Constitutional, Neurological, Skin, Genitourinary, HENT, Cardiovascular, Psychiatric, Allergic/Imm, Heme/Lymph Last edited by [name], COA on 03/25/2023  9:25 AM.       ALLERGIES Allergies  Allergen Reactions  Naproxen Shortness Of Breath   Naproxen Sodium Shortness Of Breath   Tiotropium Bromide Monohydrate Shortness Of Breath   Penicillins Rash    Has patient had a PCN reaction causing immediate rash, facial/tongue/throat swelling, SOB or lightheadedness with hypotension: Yes Has patient had a PCN reaction causing severe rash involving mucus membranes or skin necrosis: No Has patient had a PCN reaction that required hospitalization No Has patient had a PCN reaction occurring within the last 10 years: No If all of the above answers are "NO", then may proceed with Cephalosporin  use.  Has patient had a PCN reaction causing immediate rash, facial/tongue/throat swelling, SOB or lightheadedness with hypotension: Yes Has patient had a PCN reaction causing severe rash involving mucus membranes or skin necrosis: No Has patient had a PCN reaction that required hospitalization No Has patient had a PCN reaction occurring within the last 10 years: No If all of the above answers are "NO", then may proceed with Cephalosporin use.    PAST MEDICAL HISTORY Past Medical History:  Diagnosis Date   Arthritis    Asthma    COPD (chronic obstructive pulmonary disease) (HCC)    Diabetes mellitus    Type 2   Diabetic retinopathy (HCC)    NPDR OU   GERD (gastroesophageal reflux disease)    H/O hiatal hernia    History of kidney stones    Hyperlipemia    Hypertension    Hypertensive retinopathy    OU   Nasal polyps    Pneumonia 2014   Shortness of breath    Sleep apnea    pt has CPAP but is unable to use it d/t having polyps in his nose. Pt stated "I need to call and get a CPAP mask instead"   Stomach ulcer    Past Surgical History:  Procedure Laterality Date   CATARACT EXTRACTION Right 07/05/2018   Dr. Nile Riggs   CATARACT EXTRACTION Left 07/12/2018   Dr. Nile Riggs   CHOLECYSTECTOMY N/A 01/23/2015   Procedure: LAPAROSCOPIC CHOLECYSTECTOMY ;  Surgeon: Violeta Gelinas, MD;  Location: [facility];  Service: General;  Laterality: N/A;   COLONOSCOPY W/ POLYPECTOMY     ESOPHAGOGASTRODUODENOSCOPY     EYE SURGERY     KNEE ARTHROSCOPY  03/24/2011   Procedure: ARTHROSCOPY KNEE;  Surgeon: Harvie Junior, MD;  Location: [facility];  Service: Orthopedics;  Laterality: Right;  partial medial menisectomy, chondroplaty patella, and medial medial plica   LEFT HEART CATH AND CORONARY ANGIOGRAPHY N/A 06/29/2019   Procedure: LEFT HEART CATH AND CORONARY ANGIOGRAPHY;  Surgeon: Iran Ouch, MD;  Location: [facility];  Service: Cardiovascular;  Laterality: N/A;   FAMILY  HISTORY Family History  Problem Relation Age of Onset   Diabetes Father    Coronary artery disease Father    SOCIAL HISTORY Social History   Tobacco Use   Smoking status: Former    Current packs/day: 3.00    Average packs/day: 3.0 packs/day for 20.0 years (60.0 ttl pk-yrs)    Types: Cigarettes   Smokeless tobacco: Current    Types: Chew   Tobacco comments:    1 can/daily  Vaping Use   Vaping status: Never Used  Substance Use Topics   Alcohol use: Yes    Comment: social, 1x weekly   Drug use: No       OPHTHALMIC EXAM:  Base Eye Exam     Visual Acuity (Snellen - Linear)       Right Left   Dist  [place] 20/40 20/50 -1   Dist ph  20/25 NI         Tonometry (Tonopen, 9:23 AM)       Right Left   Pressure 15 14         Pupils       Dark Light Shape React APD   Right 3 2 Round Brisk None   Left 3 2 Round Brisk None         Visual Fields (Counting fingers)       Left Right    Full Full         Extraocular Movement       Right Left    Full, Ortho Full, Ortho         Neuro/Psych     Oriented x3: Yes   Mood/Affect: Normal         Dilation     Both eyes: 1.0% Mydriacyl, 2.5% Phenylephrine @ 9:23 AM           Slit Lamp and Fundus Exam     Slit Lamp Exam       Right Left   Lids/Lashes Dermatochalasis - upper lid, Telangiectasia, mild Meibomian gland dysfunction Dermatochalasis - upper lid, Telangiectasia, mild Meibomian gland dysfunction   Conjunctiva/Sclera White and quiet Mild nasal Pinguecula   Cornea Mild Arcus, Well healed temporal cataract wounds Arcus, Well healed temporal cataract wounds, trace PEE   Anterior Chamber Deep and clear Deep and clear   Iris Round and dilated, mild patch of atrophy at 0800, no NVI Round and dilated, No NVI   Lens PC IOL in good position PC IOL in good position   Anterior Vitreous Vitreous syneresis Vitreous syneresis         Fundus Exam       Right Left   Disc trace pallor, Sharp rim Pallor,  Sharp rim, Compact, mild PPP temporal   C/D Ratio 0.3 0.3   Macula Good foveal reflex, scattered IRH/DBH -- greatest temporal macula, trace cystic changes temporal mac -- slightly improved, mild focal laser changes Blunted foveal reflex, +central atrophy and pigment clumping, scattered DBH and Mas -- improved, scattered punctate exudates - greatest superior and temporal macula -- persistent, cystic changes / edema temporal macula -- slightly improved   Vessels attenuated, Tortuous attenuated, Tortuous   Periphery Attached, scattered DBH greatest posteriorly, prominent blot heme along SN arcades Attached, pigmented choroidal nevus superiorly with mild elevation, +drusen, no SRF or orange pigment -- unchanged from prior, scattered MA/DBH           IMAGING AND PROCEDURES  Imaging and Procedures for @TODAY @  OCT, Retina - OU - Both Eyes       Right Eye Quality was good. Central Foveal Thickness: 236. Progression has improved. Findings include normal foveal contour, no SRF, intraretinal hyper-reflective material, intraretinal fluid, outer retinal atrophy, vitreomacular adhesion (Interval improvement in IRF/IRHM ST macula ).   Left Eye Quality was good. Central Foveal Thickness: 215. Progression has improved. Findings include normal foveal contour, no SRF, intraretinal hyper-reflective material, intraretinal fluid, outer retinal atrophy (Persistent IRF/IRHM ST fovea and mac -- slightly improved: persistent central ORA; Sub-RPE hyper-reflective mass consistent with choroidal nevus -- superior to disc, caught on widefield -- stable from prior).   Notes *Images captured and stored on drive  Diagnosis / Impression: +DME OU OD: Interval improvement in IRF/IRHM  ST macula  OS: Persistent IRF/IRHM ST fovea and mac -- slightly improved: persistent central ORA; Sub-RPE hyper-reflective mass consistent with  choroidal nevus -- superior to disc, caught on widefield -- stable from prior  Clinical  management:  See below  Abbreviations: NFP - Normal foveal profile. CME - cystoid macular edema. PED - pigment epithelial detachment. IRF - intraretinal fluid. SRF - subretinal fluid. EZ - ellipsoid zone. ERM - epiretinal membrane. ORA - outer retinal atrophy. ORT - outer retinal tubulation. SRHM - subretinal hyper-reflective material      Intravitreal Injection, Pharmacologic Agent - OD - Right Eye       Time Out 03/25/2023. 9:57 AM. Confirmed correct patient, procedure, site, and patient consented.   Anesthesia Topical anesthesia was used. Anesthetic medications included Lidocaine 2%, Proparacaine 0.5%.   Procedure Preparation included 5% betadine to ocular surface, eyelid speculum. A supplied (32g) needle was used.   Injection: 6 mg faricimab-svoa 6 MG/0.05ML   Route: Intravitreal, Site: Right Eye   NDC: R2083049, Lot: [id number], Expiration date: 05/31/2024, Waste: 0 mL   Post-op Post injection exam found visual acuity of at least counting fingers. The patient tolerated the procedure well. There were no complications. The patient received written and verbal post procedure care education.      Intravitreal Injection, Pharmacologic Agent - OS - Left Eye       Time Out 03/25/2023. 9:58 AM. Confirmed correct patient, procedure, site, and patient consented.   Anesthesia Topical anesthesia was used. Anesthetic medications included Lidocaine 2%, Proparacaine 0.5%.   Procedure Preparation included 5% betadine to ocular surface, eyelid speculum. A supplied (32g) needle was used.   Injection: 6 mg faricimab-svoa 6 MG/0.05ML   Route: Intravitreal, Site: Left Eye   NDC: 54098-119-14, Lot: [id number], Expiration date: 05/31/2024, Waste: 0 mL   Post-op Post injection exam found visual acuity of at least counting fingers. The patient tolerated the procedure well. There were no complications. The patient received written and verbal post procedure care education. Post injection  medications were not given.            ASSESSMENT/PLAN:    ICD-10-CM   1. Severe nonproliferative diabetic retinopathy of both eyes with macular edema associated with type 2 diabetes mellitus (HCC)  E11.3413 OCT, Retina - OU - Both Eyes    Intravitreal Injection, Pharmacologic Agent - OD - Right Eye    Intravitreal Injection, Pharmacologic Agent - OS - Left Eye    faricimab-svoa (VABYSMO) 6mg /0.49mL intravitreal injection    faricimab-svoa (VABYSMO) 6mg /0.66mL intravitreal injection    2. Current use of insulin (HCC)  Z79.4     3. Long term (current) use of oral hypoglycemic drugs  Z79.84     4. 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 Essential hypertension  I10     6. Hypertensive retinopathy of both eyes  H35.033     7. Pseudophakia of both eyes  Z96.1     8. Ocular hypertension of right eye  H40.051     9. Diplopia  H53.2       1-3. Severe nonproliferative diabetic retinopathy with DME, OU (OS>OD)  - A1C 6.3 (07.26.24) - s/p IVA OD #1 (06.26.20), #2 (08.14.20), #3 (09.17.20), #4 (10.16.20), #5 (02.02.22)  - s/p IVA OS #1 (07.17.20), #2 (08.14.20), #3 (09.17.20)  **IVA RESISTANCE OU**  ====================  - s/p focal laser OD (07.18.23) - s/p IVE OD #1 (11.13.20), #2 (12.11.20), #3 (01.18.21), #4 (02.19.21), #5 (03.19.21), #6 (04.16.21), #7 (05.14.21), #8 (06.17.21), #9 (07.16.21), #10 (09.21.21), #11 (10.29.21), #12 (11.29.21), #13 (12.29.21), #14 (03.09.22), #15 (04.06.22), #16 (05.11.12), #17 (06.15.22), #18 (7.20.22), #  19 (08.24.22), #20 (10.04.22), #21 (11.08.22), #22 (12.13.22), #23 (02.21.23), #24 (03.28.23), #25 (05.09.23), #26 (06.20.23), #27 (08.01.23)  ===================== - s/p IVE OS #1 (10.16.20) - sample, #2 (11.13.20), #3 (12.11.20), #4 (01.18.21), #5 (02.19.21), #6 (03.19.21), #7 (04.16.21), #8 (05.14.21), #9 (06.17.21), #10 (7.16.21), #11 (09.21.21), #12 (10.29.21), #13 (11.29.21), #14 (12.29.21), #15 (02.02.22, sample), #16 (03.09.22), #17  (04.06.22), #18 (05.11.2012), #19 (06.15.22), #20 (7.20.22), #21 (08.24.22), #22 (10.4.22), #23 (11.8.22), #24 (12.13.22), #25 (01.17.23), #26 (02.21.23), #27 (03.28.23). #28 (05.09.23), #29 (06.20.23), #30 (08.01.23), #31 (09.12.23), #32 (10.24.23), #33 (12.05.23) -- IVE resistance ==================== - s/p IVV OD #1 (11.08.24), #2 (12.13.24), #3 (12.13.24) #4 (01.17.25) - s/p IVV OS #1 (01.16.24), #2 (02.20.24), #3 (03.26.24), #4 (04.30.24), #5 (06.04.24), #6 (07.09.24), #7 (08.14.24), #8 (10.03.24), #9 (11.08.24), #10 (12.13.24), #11 (12.13.24), #12(01.17.25) - FA (06.26.20) shows Severe NPDR OU w/ Late leaking MA OU, Enlarged FAZ OU, No NV OU, No significant hyperfluorescence of disc  - BCVA OD 20/25 - stable; OS 20/50 from 20/60 - OCT shows OD: Interval improvement in IRF/IRHM  ST macula, OS: Persistent IRF/IRHM ST fovea and mac -- slightly improved: persistent central ORA; Sub-RPE hyper-reflective mass consistent with choroidal nevus -- superior to disc, caught on widefield -- stable from prior at 5 weeks  - recommend IVV OU (OD #5 and OS #13) today, 02.21.25 w/ f/u at 5 wks  - pt in agreement  - RBA of procedure discussed, questions answered  - see procedure note  - Vabysmo informed consent form signed and scanned, 11.08.24 (OU)  - Vabysmo auth approved for 2025  - insurance will only approve one med at a time -- currently approved for Vabysmo  - f/u 5 weeks, DFE, OCT, possible injections  4. Choroidal Nevus OS  - located at 1200, mild elevation, +drusen, no SRF  - baseline optos pictures obtained, 06.26.20  - discussed possible referral to oncology at [facility] in [place]  5,6. Hypertensive retinopathy OU  - discussed importance of tight BP control  - monitor  7. Pseudophakia OU  - s/p CE/IOL OU (OD on 06.03.20 and OS 06.10.20 by Dr. Nile Riggs)  - beautiful surgeries w/ IOLs in excellent position  - macular edema limiting vision as above  - monitor  8. Ocular hypertension    - IOP 15,14  - cont Cosopt qd OU  - monitor  9. Intermittent diplopia - pt reports intermittent episodes of diplopia with both vertical and horizontal components; no rotational - pt had appt with Dr. Ernesto Rutherford on 11.9.23 -- noted some improvement with prism but did not recommend getting the prism at this time  - pt feels diplopia is improved -- episodes less frequent   Ophthalmic Meds Ordered this visit:  Meds ordered this encounter  Medications   faricimab-svoa (VABYSMO) 6mg /0.46mL intravitreal injection   faricimab-svoa (VABYSMO) 6mg /0.40mL intravitreal injection     Return in about 5 weeks (around 04/29/2023) for f/u NPDR OU , DFE, OCT, Possible, IVV, OU.  There are no Patient Instructions on file for this visit.  This document serves as a record of services personally performed by Karie Chimera, MD, PhD. It was created on their behalf by Berlin Hun COT, an ophthalmic technician. The creation of this record is the provider's dictation and/or activities during the visit.    Electronically signed by: Berlin Hun COT 02.18.25 4:52 PM  This document serves as a record of services personally performed by Karie Chimera, MD, PhD. It was created on their behalf by Odie Sera.  Geraci, COT an ophthalmic technician. The creation of this record is the provider's dictation and/or activities during the visit.    Electronically signed by:  Charlette Caffey, COT  03/25/23 4:52 PM  Karie Chimera, M.D., Ph.D. Diseases & Surgery of the Retina and Vitreous [facility] 03/25/2023   I have reviewed the above documentation for accuracy and completeness, and I agree with the above. Karie Chimera, M.D., Ph.D. 03/25/23 4:54 PM   Abbreviations: M myopia (nearsighted); A astigmatism; H hyperopia (farsighted); P presbyopia; Mrx spectacle prescription;  CTL contact lenses; OD right eye; OS left eye; OU both eyes  XT exotropia; ET esotropia; PEK  punctate epithelial keratitis; PEE punctate epithelial erosions; DES dry eye syndrome; MGD meibomian gland dysfunction; ATs artificial tears; PFAT's preservative free artificial tears; NSC nuclear sclerotic cataract; PSC posterior subcapsular cataract; ERM epi-retinal membrane; PVD posterior vitreous detachment; RD retinal detachment; DM diabetes mellitus; DR diabetic retinopathy; NPDR non-proliferative diabetic retinopathy; PDR proliferative diabetic retinopathy; CSME clinically significant macular edema; DME diabetic macular edema; dbh dot blot hemorrhages; CWS cotton wool spot; POAG primary open angle glaucoma; C/D cup-to-disc ratio; HVF humphrey visual field; GVF goldmann visual field; OCT optical coherence tomography; IOP intraocular pressure; BRVO Branch retinal vein occlusion; CRVO central retinal vein occlusion; CRAO central retinal artery occlusion; BRAO branch retinal artery occlusion; RT retinal tear; SB scleral buckle; PPV pars plana vitrectomy; VH Vitreous hemorrhage; PRP panretinal laser photocoagulation; IVK intravitreal kenalog; VMT vitreomacular traction; MH Macular hole;  NVD neovascularization of the disc; NVE neovascularization elsewhere; AREDS age related eye disease study; ARMD age related macular degeneration; POAG primary open angle glaucoma; EBMD epithelial/anterior basement membrane dystrophy; ACIOL anterior chamber intraocular lens; IOL intraocular lens; PCIOL posterior chamber intraocular lens; Phaco/IOL phacoemulsification with intraocular lens placement; PRK photorefractive keratectomy; LASIK laser assisted in situ keratomileusis; HTN hypertension; DM diabetes mellitus; COPD chronic obstructive pulmonary disease

## 2023-03-24 DIAGNOSIS — M75122 Complete rotator cuff tear or rupture of left shoulder, not specified as traumatic: Secondary | ICD-10-CM | POA: Diagnosis not present

## 2023-03-25 ENCOUNTER — Telehealth: Payer: Self-pay

## 2023-03-25 ENCOUNTER — Ambulatory Visit (INDEPENDENT_AMBULATORY_CARE_PROVIDER_SITE_OTHER): Payer: BC Managed Care – PPO | Admitting: Ophthalmology

## 2023-03-25 ENCOUNTER — Encounter (INDEPENDENT_AMBULATORY_CARE_PROVIDER_SITE_OTHER): Payer: Self-pay | Admitting: Ophthalmology

## 2023-03-25 DIAGNOSIS — E113413 Type 2 diabetes mellitus with severe nonproliferative diabetic retinopathy with macular edema, bilateral: Secondary | ICD-10-CM

## 2023-03-25 DIAGNOSIS — Z794 Long term (current) use of insulin: Secondary | ICD-10-CM | POA: Diagnosis not present

## 2023-03-25 DIAGNOSIS — I10 Essential (primary) hypertension: Secondary | ICD-10-CM

## 2023-03-25 DIAGNOSIS — H532 Diplopia: Secondary | ICD-10-CM

## 2023-03-25 DIAGNOSIS — D3132 Benign neoplasm of left choroid: Secondary | ICD-10-CM | POA: Diagnosis not present

## 2023-03-25 DIAGNOSIS — Z961 Presence of intraocular lens: Secondary | ICD-10-CM

## 2023-03-25 DIAGNOSIS — H35033 Hypertensive retinopathy, bilateral: Secondary | ICD-10-CM

## 2023-03-25 DIAGNOSIS — H40051 Ocular hypertension, right eye: Secondary | ICD-10-CM

## 2023-03-25 DIAGNOSIS — Z7984 Long term (current) use of oral hypoglycemic drugs: Secondary | ICD-10-CM | POA: Diagnosis not present

## 2023-03-25 MED ORDER — FARICIMAB-SVOA 6 MG/0.05ML IZ SOSY
6.0000 mg | PREFILLED_SYRINGE | INTRAVITREAL | Status: AC | PRN
  Administered 2023-03-25: 6 mg via INTRAVITREAL

## 2023-03-25 NOTE — Telephone Encounter (Signed)
   Pre-operative Risk Assessment    Patient Name: Juan [name]  DOB: [birth year] MRN: [id number]   Date of last office visit: 04/02/22 w/ Dr. Weston Brass Date of next office visit: None    Request for Surgical Clearance    Procedure:   Left Shoulder Scope RCR, biceps tenodesis, SAD  Date of Surgery:  Clearance TBD                                Surgeon:  Dr. Duwayne Heck Surgeon's Group or Practice Name:  [facility] Phone number:  [phone] Fax number:  [phone]   Type of Clearance Requested:   - Medical  - Pharmacy:  Hold Aspirin     Type of Anesthesia:   Choice    Additional requests/questions:    SignedCathlean Cower Finneas Mathe   03/25/2023, 3:11 PM

## 2023-03-25 NOTE — Telephone Encounter (Signed)
   Name: Juan [name]  DOB: 1960-06-21  MRN: [id number]  Primary Cardiologist: Parke Poisson, MD  Chart reviewed as part of pre-operative protocol coverage. Because of [name] past medical history and time since last visit, he will require a follow-up in-office visit in order to better assess preoperative cardiovascular risk.  Pre-op covering staff: - Please schedule appointment and call patient to inform them. If patient already had an upcoming appointment within acceptable timeframe, please add "pre-op clearance" to the appointment notes so provider is aware. - Please contact requesting surgeon's office via preferred method (i.e, phone, fax) to inform them of need for appointment prior to surgery.  He is on ASA for a hx of CAD. As long as no new cardiac symptoms, he can hold ASA x 5-7 days prior to procedure and resume when medically safe to do so.  Sharlene Dory, PA-C  03/25/2023, 3:21 PM

## 2023-03-25 NOTE — Telephone Encounter (Signed)
 Patient is returning call.

## 2023-03-25 NOTE — Telephone Encounter (Signed)
Tried calling patient to schedule in person ov no answer left a detailed message to call back and schedule

## 2023-03-28 NOTE — Telephone Encounter (Signed)
 Pt asked for to call back and left him a vm as he was at work and had nothing to write with. I left detailed message with appt information.

## 2023-03-28 NOTE — Telephone Encounter (Signed)
 Pt has been scheduled in office preop appt 04/08/23 @ [facility] office with Edd Fabian, FNP.

## 2023-04-06 NOTE — Progress Notes (Signed)
 Cardiology Clinic Note   Patient Name: Juan [name] Date of Encounter: 04/08/2023  Primary Care Provider:  Laurann Montana, MD Primary Cardiologist:  Parke Poisson, MD  Patient Profile    Juan [name] 63 year old male presents to the clinic today for follow-up evaluation of his hypertension, hyperlipidemia, and preoperative cardiac evaluation.  Past Medical History    Past Medical History:  Diagnosis Date   Arthritis    Asthma    COPD (chronic obstructive pulmonary disease) (HCC)    Diabetes mellitus    Type 2   Diabetic retinopathy (HCC)    NPDR OU   GERD (gastroesophageal reflux disease)    H/O hiatal hernia    History of kidney stones    Hyperlipemia    Hypertension    Hypertensive retinopathy    OU   Nasal polyps    Pneumonia 2014   Shortness of breath    Sleep apnea    pt has CPAP but is unable to use it d/t having polyps in his nose. Pt stated "I need to call and get a CPAP mask instead"   Stomach ulcer    Past Surgical History:  Procedure Laterality Date   CATARACT EXTRACTION Right 07/05/2018   Dr. Nile Riggs   CATARACT EXTRACTION Left 07/12/2018   Dr. Nile Riggs   CHOLECYSTECTOMY N/A 01/23/2015   Procedure: LAPAROSCOPIC CHOLECYSTECTOMY ;  Surgeon: Violeta Gelinas, MD;  Location: [facility] OR;  Service: General;  Laterality: N/A;   COLONOSCOPY W/ POLYPECTOMY     ESOPHAGOGASTRODUODENOSCOPY     EYE SURGERY     KNEE ARTHROSCOPY  03/24/2011   Procedure: ARTHROSCOPY KNEE;  Surgeon: Harvie Junior, MD;  Location: [facility];  Service: Orthopedics;  Laterality: Right;  partial medial menisectomy, chondroplaty patella, and medial medial plica   LEFT HEART CATH AND CORONARY ANGIOGRAPHY N/A 06/29/2019   Procedure: LEFT HEART CATH AND CORONARY ANGIOGRAPHY;  Surgeon: Iran Ouch, MD;  Location: [facility];  Service: Cardiovascular;  Laterality: N/A;    Allergies  Allergies  Allergen Reactions   Naproxen Shortness Of Breath   Naproxen  Sodium Shortness Of Breath   Tiotropium Bromide Monohydrate Shortness Of Breath   Penicillins Rash    Has patient had a PCN reaction causing immediate rash, facial/tongue/throat swelling, SOB or lightheadedness with hypotension: Yes Has patient had a PCN reaction causing severe rash involving mucus membranes or skin necrosis: No Has patient had a PCN reaction that required hospitalization No Has patient had a PCN reaction occurring within the last 10 years: No If all of the above answers are "NO", then may proceed with Cephalosporin use.  Has patient had a PCN reaction causing immediate rash, facial/tongue/throat swelling, SOB or lightheadedness with hypotension: Yes Has patient had a PCN reaction causing severe rash involving mucus membranes or skin necrosis: No Has patient had a PCN reaction that required hospitalization No Has patient had a PCN reaction occurring within the last 10 years: No If all of the above answers are "NO", then may proceed with Cephalosporin use.     History of Present Illness    Juan [name] has a PMH of coronary artery disease, aortic atherosclerosis, HTN, HLD, OSA, type 2 diabetes, COPD, and GERD.  He was noted to have moderate-severe calcified coronary arteries on LHC which was performed after abnormal stress testing.  Medical management was recommended due to discrepancy between ischemia and lesion location.  He was seen in follow-up in 04/02/2022 by Dr. Jacques Navy.  During that time he was stable from a cardiac standpoint.  He had minimal palpitations.  He was tolerating his medication well.  He was trying to start to exercise.  He was doing resistance training.  He was also walking and playing golf.  He did report occasional shortness of breath with exercise which was not limiting.  He was consuming [facility] regularly.  He was also drinking about 8 bottles of water per day.  He denied chest pain orthopnea and PND.  He presents to the clinic today for  follow-up evaluation and preoperative cardiac evaluation.  He states he is somewhat nervous this morning about his visit and his upcoming surgery.  He also drank coffee before coming to the office today.  He reports that his blood pressure at home is normally in the 125/70 range.  Initially today his blood pressure is 140/80 and on recheck is 132/68.  His EKG shows sinus tachycardia with PVCs.  On recheck his pulse is 84.  We reviewed his medications.  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  He is following a low-carb diet which she just darted.  I will continue his current medication regimen, have him increase his physical activity as tolerated and plan follow-up in 9 to 12 months.  Today he denies chest pain, shortness of breath, lower extremity edema, fatigue, palpitations, melena, hematuria, hemoptysis, diaphoresis, weakness, presyncope, syncope, orthopnea, and PND.   Home Medications    Prior to Admission medications   Medication Sig Start Date End Date Taking? Authorizing Provider  ADVAIR DISKUS 500-50 MCG/DOSE AEPB Inhale 1 puff into the lungs 2 (two) times daily. 01/09/15   [provider]  albuterol (VENTOLIN HFA) 108 (90 Base) MCG/ACT inhaler Inhale 1-2 puffs into the lungs every 6 (six) hours as needed for wheezing or shortness of breath. 01/03/20   Wieters, Hallie C, PA-C  alfuzosin (UROXATRAL) 10 MG 24 hr tablet TAKE 1 TABLET BY MOUTH EVERYDAY AT BEDTIME 07/15/20   McKenzie, Mardene Celeste, MD  aspirin EC 81 MG tablet Take 81 mg by mouth daily.    [provider]  atorvastatin (LIPITOR) 40 MG tablet Take 1 tablet (40 mg total) by mouth daily. 10/13/22   Parke Poisson, MD  carvedilol (COREG) 12.5 MG tablet Take 1 tablet (12.5 mg total) by mouth 2 (two) times daily with a meal. 10/13/22   Parke Poisson, MD  dorzolamide-timolol (COSOPT) 22.3-6.8 MG/ML ophthalmic solution  Place 1 drop into the right eye 2 (two) times daily. Patient taking differently: Place 1 drop into both eyes 2 (two) times daily. 03/05/20   Rennis Chris, MD  Empagliflozin-metFORMIN HCl [facility]) 12.06-998 MG TABS Take 1 tablet by mouth 2 (two) times a day.  07/06/18   [provider]  esomeprazole (NEXIUM) 40 MG capsule Take 40 mg by mouth daily at 12 noon.    [provider]  famotidine (PEPCID) 40 MG tablet Take by mouth. 02/07/21   [provider]  insulin glargine, 2 Unit Dial, (TOUJEO MAX SOLOSTAR) 300 UNIT/ML Solostar Pen Inject 40 Units into the skin daily.     [provider]  iron polysaccharides (NIFEREX) 150 MG capsule Take 150 mg by mouth daily. 10/14/19   [provider]  isosorbide mononitrate (IMDUR) 30 MG 24 hr tablet Take 1 tablet (30 mg total) by mouth daily. 10/13/22   Parke Poisson, MD  lisinopril (ZESTRIL) 40 MG tablet Take 1 tablet (40 mg total) by mouth daily. 10/21/21   Parke Poisson, MD  nitroGLYCERIN (NITROSTAT) 0.4 MG SL tablet Place 0.4 mg under the tongue daily. 02/08/21   [provider]  OZEMPIC, 0.25 OR 0.5 MG/DOSE, 2 MG/3ML SOPN Inject 0.25 mg into the skin once a week.    [provider]    Family History    Family History  Problem Relation Age of Onset   Diabetes Father    Coronary artery disease Father    He indicated that the status of his father is unknown.  Social History    Social History   Socioeconomic History   Marital status: Single    Spouse name: Not on file   Number of children: Not on file   Years of education: Not on file   Highest education level: Not on file  Occupational History   Not on file  Tobacco Use   Smoking status: Former    Current packs/day: 3.00    Average packs/day: 3.0 packs/day for 20.0 years (60.0 ttl pk-yrs)    Types: Cigarettes   Smokeless tobacco: Current    Types: Chew   Tobacco comments:    1 can/daily  Vaping Use   Vaping status: Never  Used  Substance and Sexual Activity   Alcohol use: Yes    Alcohol/week: 2.0 standard drinks of alcohol    Types: 2 Cans of beer per week    Comment: social, 1x weekly   Drug use: No   Sexual activity: Yes    Partners: Female    Comment: married  Other Topics Concern   Not on file  Social History Narrative   Not on file   Social Drivers of Health   Financial Resource Strain: Low Risk  (02/07/2021)   Received from [facility], [facility]   Overall Financial Resource Strain (CARDIA)    Difficulty of Paying Living Expenses: Not hard at all  Food Insecurity: No Food Insecurity (02/27/2021)   Received from [facility], [facility]   Hunger Vital Sign    Worried About Running Out of Food in the Last Year: Never true    Ran Out of Food in the Last Year: Never true  Transportation Needs: Not on file  Physical Activity: Not on file  Stress: No Stress Concern Present (02/07/2021)   Received from [facility], [facility]   [employer] of Occupational Health - Occupational Stress Questionnaire    Feeling of Stress : Not at all  Social Connections: Unknown (06/04/2021)   Received from [facility], [facility]   Social Network    Social Network: Not on file  Intimate Partner Violence: Unknown (05/04/2021)   Received from [facility], [facility]   HITS    Physically Hurt: Not on file    Insult or Talk Down To: Not on file    Threaten Physical Harm: Not on file    Scream or Curse: Not on file     Review of Systems    General:  No chills, fever, night sweats or weight changes.  Cardiovascular:  No chest pain, dyspnea on exertion, edema, orthopnea, palpitations, paroxysmal nocturnal dyspnea. Dermatological: No rash, lesions/masses Respiratory: No cough, dyspnea Urologic: No hematuria, dysuria Abdominal:   No nausea, vomiting, diarrhea, bright red blood per rectum, melena, or hematemesis Neurologic:  No visual changes, wkns, changes in mental status. All other  systems reviewed and are otherwise negative except as noted above.  Physical Exam    VS:  BP 132/68   Pulse 84   Ht 5\' 9"  (1.753 m)   Wt 239 lb 6.4 oz (108.6 kg)   SpO2 94%   BMI 35.35 kg/m  , BMI Body mass index is 35.35 kg/m. GEN: Well nourished, well developed, in no acute distress. HEENT: normal. Neck: Supple, no JVD, carotid bruits, or masses. Cardiac: RRR, no murmurs, rubs, or gallops. No clubbing, cyanosis, left ankle edema nonpitting.  Radials/DP/PT 2+ and equal bilaterally.  Respiratory:  Respirations regular and unlabored, clear to auscultation bilaterally. GI: Soft, nontender, nondistended, BS + x 4. MS: no deformity or atrophy. Skin: warm and dry, no rash. Neuro:  Strength and sensation are intact. Psych: Normal affect.  Accessory Clinical Findings    Recent Labs: No results found for requested labs within last 365 days.   Recent Lipid Panel    Component Value Date/Time   CHOL  03/05/2010 0310    90        ATP III CLASSIFICATION:  <200     mg/dL   Desirable  161-096  mg/dL   Borderline High  >=045    mg/dL   High          TRIG 66 03/05/2010 0310   HDL 32 (L) 03/05/2010 0310   CHOLHDL 2.8 03/05/2010 0310   VLDL 13 03/05/2010 0310   LDLCALC  03/05/2010 0310    45        Total Cholesterol/HDL:CHD Risk Coronary Heart Disease Risk Table                     Men   Women  1/2 Average Risk   3.4   3.3  Average Risk       5.0   4.4  2 X Average Risk   9.6   7.1  3 X Average Risk  23.4   11.0        Use the calculated Patient Ratio above and the CHD Risk Table to determine the patient's CHD Risk.        ATP III CLASSIFICATION (LDL):  <100     mg/dL   Optimal  409-811  mg/dL   Near or Above                    Optimal  130-159  mg/dL   Borderline  914-782  mg/dL   High  >956     mg/dL   Very High         ECG personally reviewed by me today- EKG Interpretation Date/Time:  Friday April 08 2023 09:02:10 EST Ventricular Rate:  108 PR  Interval:  144 QRS Duration:  84 QT Interval:  340 QTC Calculation: 455 R Axis:   0  Text Interpretation: Sinus tachycardia with frequent Premature ventricular complexes Possible Left atrial enlargement When compared with ECG of 11-Apr-2019 00:47, PREVIOUS ECG IS PRESENT Confirmed by Edd Fabian [id number]) on 04/08/2023 9:05:29 AM         Assessment & Plan   1.  Coronary artery disease-denies recent episodes of arm neck back or chest discomfort.  LHC 06/29/2019 showed LVEF of 55-65%, distal RCA 50%, RPDA 60%, distal circumflex 85%, proximal LAD-mid LAD 30% first diagonal 30%.  Medical management was recommended. Continue aspirin, atorvastatin, Imdur, lisinopril Heart healthy low-sodium diet Increase physical activity as tolerated  Hyperlipidemia-LDL 39 on 02/25/23. High-fiber diet Increase physical activity as tolerated Continue atorvastatin, aspirin  Essential hypertension-BP today 132/68. Continue Imdur,  lisinopril, carvedilol Maintain blood pressure log  PVCs-does note occasional episodes of palpitations.  Nonlimiting. Continue carvedilol Maintain p.o. hydration Continue to monitor  COPD-no increased work of breathing. Follows with pulmonology  OSA-reports compliance with CPAP.  Waking up well rested. Continue CPAP use  Preoperative cardiac exam-Procedure:   Left Shoulder Scope RCR, biceps tenodesis, SAD   Date of Surgery:  Clearance TBD                                  Surgeon:  Dr. Duwayne Heck Surgeon's Group or Practice Name:  [facility] Phone number:  [phone] Fax number:  [phone]      Primary Cardiologist: Parke Poisson, MD  Chart reviewed as part of pre-operative protocol coverage. Given past medical history and time since last visit, based on ACC/AHA guidelines, Juan [name] would be at acceptable risk for the planned procedure without further cardiovascular testing.   His RCRI is moderate risk, 6.6%  risk of major cardiac event.  He  is able to complete greater than 4 METS of physical activity.  His aspirin should be continued throughout the perioperative period.  Patient was advised that if he develops new symptoms prior to surgery to contact our office to arrange a follow-up appointment.  He verbalized understanding.  I will route this recommendation to the requesting party via Epic fax function and remove from pre-op pool.   Disposition: Follow-up with Dr. Jacques Navy or me in 9-12 months.    Thomasene Ripple. Tanikka Bresnan NP-C     04/08/2023, 9:22 AM [facility] [address] Office [phone] Fax [phone]    I spent 14 minutes examining this patient, reviewing medications, and using patient centered shared decision making involving their cardiac care.   I spent  20 minutes reviewing past medical history,  medications, and prior cardiac tests.

## 2023-04-08 ENCOUNTER — Encounter: Payer: Self-pay | Admitting: General Practice

## 2023-04-08 ENCOUNTER — Ambulatory Visit: Payer: BC Managed Care – PPO | Attending: General Practice | Admitting: General Practice

## 2023-04-08 VITALS — BP 132/68 | HR 84 | Ht 69.0 in | Wt 239.4 lb

## 2023-04-08 DIAGNOSIS — G4733 Obstructive sleep apnea (adult) (pediatric): Secondary | ICD-10-CM | POA: Diagnosis not present

## 2023-04-08 DIAGNOSIS — I2510 Atherosclerotic heart disease of native coronary artery without angina pectoris: Secondary | ICD-10-CM | POA: Diagnosis not present

## 2023-04-08 DIAGNOSIS — Z01810 Encounter for preprocedural cardiovascular examination: Secondary | ICD-10-CM | POA: Diagnosis not present

## 2023-04-08 DIAGNOSIS — E785 Hyperlipidemia, unspecified: Secondary | ICD-10-CM

## 2023-04-08 DIAGNOSIS — I10 Essential (primary) hypertension: Secondary | ICD-10-CM

## 2023-04-08 DIAGNOSIS — I493 Ventricular premature depolarization: Secondary | ICD-10-CM

## 2023-04-08 NOTE — Patient Instructions (Signed)
 Medication Instructions:  The current medical regimen is effective;  continue present plan and medications as directed. Please refer to the Current Medication list given to you today.  *If you need a refill on your cardiac medications before your next appointment, please call your pharmacy*  Lab Work: NONE  Other Instructions CLEARED FOR UPCOMING SURGERY AVOID CAFFEINE INCREASE HYDRATION CONTINUE TO FOLLOW LOW CARB DIET INCREASE PHYSICAL ACTIVITY AS TOLERATED  Follow-Up: At [facility], you and your health needs are our priority.  As part of our continuing mission to provide you with exceptional heart care, we have created designated Provider Care Teams.  These Care Teams include your primary Cardiologist (physician) and Advanced Practice Providers (APPs -  Physician Assistants and Nurse Practitioners) who all work together to provide you with the care you need, when you need it.   Your next appointment:   9-12 month(s)  Provider:   Parke Poisson, MD        s

## 2023-04-29 ENCOUNTER — Encounter (INDEPENDENT_AMBULATORY_CARE_PROVIDER_SITE_OTHER): Payer: BC Managed Care – PPO | Admitting: Ophthalmology

## 2023-04-29 DIAGNOSIS — D3132 Benign neoplasm of left choroid: Secondary | ICD-10-CM

## 2023-04-29 DIAGNOSIS — Z7984 Long term (current) use of oral hypoglycemic drugs: Secondary | ICD-10-CM

## 2023-04-29 DIAGNOSIS — H40051 Ocular hypertension, right eye: Secondary | ICD-10-CM

## 2023-04-29 DIAGNOSIS — H35033 Hypertensive retinopathy, bilateral: Secondary | ICD-10-CM

## 2023-04-29 DIAGNOSIS — I10 Essential (primary) hypertension: Secondary | ICD-10-CM

## 2023-04-29 DIAGNOSIS — Z794 Long term (current) use of insulin: Secondary | ICD-10-CM

## 2023-04-29 DIAGNOSIS — Z961 Presence of intraocular lens: Secondary | ICD-10-CM

## 2023-04-29 DIAGNOSIS — H532 Diplopia: Secondary | ICD-10-CM

## 2023-04-29 DIAGNOSIS — E113413 Type 2 diabetes mellitus with severe nonproliferative diabetic retinopathy with macular edema, bilateral: Secondary | ICD-10-CM

## 2023-05-02 DIAGNOSIS — M7522 Bicipital tendinitis, left shoulder: Secondary | ICD-10-CM | POA: Diagnosis not present

## 2023-05-02 DIAGNOSIS — S43432A Superior glenoid labrum lesion of left shoulder, initial encounter: Secondary | ICD-10-CM | POA: Diagnosis not present

## 2023-05-02 DIAGNOSIS — M7542 Impingement syndrome of left shoulder: Secondary | ICD-10-CM | POA: Diagnosis not present

## 2023-05-02 DIAGNOSIS — M75122 Complete rotator cuff tear or rupture of left shoulder, not specified as traumatic: Secondary | ICD-10-CM | POA: Diagnosis not present

## 2023-05-02 DIAGNOSIS — M7582 Other shoulder lesions, left shoulder: Secondary | ICD-10-CM | POA: Diagnosis not present

## 2023-05-02 DIAGNOSIS — G8918 Other acute postprocedural pain: Secondary | ICD-10-CM | POA: Diagnosis not present

## 2023-05-02 DIAGNOSIS — M24112 Other articular cartilage disorders, left shoulder: Secondary | ICD-10-CM | POA: Diagnosis not present

## 2023-05-02 DIAGNOSIS — M7552 Bursitis of left shoulder: Secondary | ICD-10-CM | POA: Diagnosis not present

## 2023-05-09 DIAGNOSIS — M25612 Stiffness of left shoulder, not elsewhere classified: Secondary | ICD-10-CM | POA: Diagnosis not present

## 2023-05-09 DIAGNOSIS — M25512 Pain in left shoulder: Secondary | ICD-10-CM | POA: Diagnosis not present

## 2023-05-13 ENCOUNTER — Other Ambulatory Visit: Payer: Self-pay | Admitting: Internal Medicine

## 2023-05-16 DIAGNOSIS — M25512 Pain in left shoulder: Secondary | ICD-10-CM | POA: Diagnosis not present

## 2023-05-16 DIAGNOSIS — M25612 Stiffness of left shoulder, not elsewhere classified: Secondary | ICD-10-CM | POA: Diagnosis not present

## 2023-05-23 DIAGNOSIS — M25512 Pain in left shoulder: Secondary | ICD-10-CM | POA: Diagnosis not present

## 2023-05-23 DIAGNOSIS — M25612 Stiffness of left shoulder, not elsewhere classified: Secondary | ICD-10-CM | POA: Diagnosis not present

## 2023-05-31 DIAGNOSIS — M25512 Pain in left shoulder: Secondary | ICD-10-CM | POA: Diagnosis not present

## 2023-05-31 DIAGNOSIS — M25612 Stiffness of left shoulder, not elsewhere classified: Secondary | ICD-10-CM | POA: Diagnosis not present

## 2023-06-01 ENCOUNTER — Other Ambulatory Visit: Payer: Self-pay

## 2023-06-01 MED ORDER — ATORVASTATIN CALCIUM 40 MG PO TABS: 40.0000 mg | ORAL_TABLET | Freq: Every day | ORAL | 3 refills | Status: AC

## 2023-06-01 MED ORDER — ISOSORBIDE MONONITRATE ER 30 MG PO TB24: 30.0000 mg | ORAL_TABLET | Freq: Every day | ORAL | 3 refills | Status: AC

## 2023-06-01 MED ORDER — CARVEDILOL 12.5 MG PO TABS: 12.5000 mg | ORAL_TABLET | Freq: Two times a day (BID) | ORAL | 3 refills | Status: AC

## 2023-06-01 NOTE — Addendum Note (Signed)
 Addended by: Gayleen Kawasaki D on: 06/01/2023 04:53 PM   Modules accepted: Orders

## 2023-06-07 DIAGNOSIS — M25512 Pain in left shoulder: Secondary | ICD-10-CM | POA: Diagnosis not present

## 2023-06-07 DIAGNOSIS — M25612 Stiffness of left shoulder, not elsewhere classified: Secondary | ICD-10-CM | POA: Diagnosis not present

## 2023-06-07 NOTE — Progress Notes (Signed)
 [facility] - Clinic Note  06/08/2023     CHIEF COMPLAINT Patient presents for Retina Follow Up   HISTORY OF PRESENT ILLNESS: Juan [name] is a 63 y.o. male who presents to the clinic today for:   HPI     Retina Follow Up   Patient presents with  Diabetic Retinopathy.  In both eyes.  This started 5 years ago.  Severity is severe.  Duration of 11 weeks.  I, the attending physician,  performed the HPI with the patient and updated documentation appropriately.        Comments   Pt presents for 11 week (delayed 6wks) retina follow up, NPDR OU. Patient states he can tell there is a bigger blind spot in his left eye. Pt denies FOL/floaters/discomfort. Pts last A1c was 8.1 about 8 months ago. Pt states BS was 160 fasting, 4 days ago. Pt does not use any ats.      Last edited by Ronelle Coffee, MD on 06/08/2023  6:17 PM.     Patient feels the vision is holding steady. Patient states that he is having surgery in 5 weeks on his left shoulder for torn rotator cuff. He fell off a retention wall.   Referring physician: Victorio Grave, MD [id number] [place],  [place] [address]  HISTORICAL INFORMATION:   Selected notes from the MEDICAL RECORD NUMBER Referred by Dr. Albert Huff for concern of CME s/p cataract sx   CURRENT MEDICATIONS: Current Outpatient Medications (Ophthalmic Drugs)  Medication Sig   dorzolamide -timolol  (COSOPT ) 22.3-6.8 MG/ML ophthalmic solution Place 1 drop into the right eye 2 (two) times daily. (Patient not taking: Reported on 06/08/2023)   No current facility-administered medications for this visit. (Ophthalmic Drugs)   Current Outpatient Medications (Other)  Medication Sig   albuterol  (VENTOLIN  HFA) 108 (90 Base) MCG/ACT inhaler Inhale 1-2 puffs into the lungs every 6 (six) hours as needed for wheezing or shortness of breath.   alfuzosin (UROXATRAL) 10 MG 24 hr tablet TAKE 1 TABLET BY MOUTH EVERYDAY AT BEDTIME   aspirin  EC 81  MG tablet Take 81 mg by mouth daily.   atorvastatin  (LIPITOR) 40 MG tablet Take 1 tablet (40 mg total) by mouth daily.   carvedilol  (COREG ) 12.5 MG tablet Take 1 tablet (12.5 mg total) by mouth 2 (two) times daily with a meal.   Empagliflozin-metFORMIN HCl (SYNJARDY) 12.06-998 MG TABS Take 1 tablet by mouth 2 (two) times a day.    esomeprazole (NEXIUM) 40 MG capsule Take 40 mg by mouth daily at 12 noon.   insulin  glargine, 2 Unit Dial, (TOUJEO MAX SOLOSTAR) 300 UNIT/ML Solostar Pen Inject 40 Units into the skin daily.    iron polysaccharides (NIFEREX) 150 MG capsule Take 150 mg by mouth daily.   isosorbide  mononitrate (IMDUR ) 30 MG 24 hr tablet Take 1 tablet (30 mg total) by mouth daily.   lisinopril  (ZESTRIL ) 40 MG tablet Take 1 tablet (40 mg total) by mouth daily.   nitroGLYCERIN (NITROSTAT) 0.4 MG SL tablet Place 0.4 mg under the tongue daily.   WIXELA INHUB 250-50 MCG/ACT AEPB Inhale 1 puff into the lungs 2 (two) times daily.   ADVAIR DISKUS 500-50 MCG/DOSE AEPB Inhale 1 puff into the lungs 2 (two) times daily. (Patient not taking: Reported on 06/08/2023)   famotidine  (PEPCID ) 40 MG tablet Take by mouth. (Patient not taking: Reported on 06/08/2023)   OZEMPIC, 0.25 OR 0.5 MG/DOSE, 2 MG/3ML SOPN Inject 0.25 mg into the skin  once a week. (Patient not taking: Reported on 06/08/2023)   No current facility-administered medications for this visit. (Other)   REVIEW OF SYSTEMS: ROS   Positive for: Gastrointestinal, Musculoskeletal, Endocrine, Eyes, Respiratory Negative for: Constitutional, Neurological, Skin, Genitourinary, HENT, Cardiovascular, Psychiatric, Allergic/Imm, Heme/Lymph Last edited by Carrington Clack, COT on 06/08/2023  7:59 AM.     ALLERGIES Allergies  Allergen Reactions   Naproxen Shortness Of Breath   Naproxen Sodium Shortness Of Breath   Tiotropium Bromide Monohydrate  Shortness Of Breath   Penicillins Rash    Has patient had a PCN reaction causing immediate rash,  facial/tongue/throat swelling, SOB or lightheadedness with hypotension: Yes Has patient had a PCN reaction causing severe rash involving mucus membranes or skin necrosis: No Has patient had a PCN reaction that required hospitalization No Has patient had a PCN reaction occurring within the last 10 years: No If all of the above answers are "NO", then may proceed with Cephalosporin use.  Has patient had a PCN reaction causing immediate rash, facial/tongue/throat swelling, SOB or lightheadedness with hypotension: Yes Has patient had a PCN reaction causing severe rash involving mucus membranes or skin necrosis: No Has patient had a PCN reaction that required hospitalization No Has patient had a PCN reaction occurring within the last 10 years: No If all of the above answers are "NO", then may proceed with Cephalosporin use.    PAST MEDICAL HISTORY Past Medical History:  Diagnosis Date   Arthritis    Asthma    COPD (chronic obstructive pulmonary disease) (HCC)    Diabetes mellitus    Type 2   Diabetic retinopathy (HCC)    NPDR OU   GERD (gastroesophageal reflux disease)    H/O hiatal hernia    History of kidney stones    Hyperlipemia    Hypertension    Hypertensive retinopathy    OU   Nasal polyps    Pneumonia 2014   Shortness of breath    Sleep apnea    pt has CPAP but is unable to use it d/t having polyps in his nose. Pt stated "I need to call and get a CPAP mask instead"   Stomach ulcer    Past Surgical History:  Procedure Laterality Date   CATARACT EXTRACTION Right 07/05/2018   Dr. Gennie Kicks   CATARACT EXTRACTION Left 07/12/2018   Dr. Gennie Kicks   CHOLECYSTECTOMY N/A 01/23/2015   Procedure: LAPAROSCOPIC CHOLECYSTECTOMY ;  Surgeon: Dorena Gander, MD;  Location: [facility] OR;  Service: General;  Laterality: N/A;   COLONOSCOPY W/ POLYPECTOMY     ESOPHAGOGASTRODUODENOSCOPY     EYE SURGERY     KNEE ARTHROSCOPY  03/24/2011   Procedure: ARTHROSCOPY KNEE;  Surgeon: Boston Byers, MD;   Location: [facility];  Service: Orthopedics;  Laterality: Right;  partial medial menisectomy, chondroplaty patella, and medial medial plica   LEFT HEART CATH AND CORONARY ANGIOGRAPHY N/A 06/29/2019   Procedure: LEFT HEART CATH AND CORONARY ANGIOGRAPHY;  Surgeon: Wenona Hamilton, MD;  Location: [facility];  Service: Cardiovascular;  Laterality: N/A;   FAMILY HISTORY Family History  Problem Relation Age of Onset   Diabetes Father    Coronary artery disease Father    SOCIAL HISTORY Social History   Tobacco Use   Smoking status: Former    Current packs/day: 3.00    Average packs/day: 3.0 packs/day for 20.0 years (60.0 ttl pk-yrs)    Types: Cigarettes   Smokeless tobacco: Current    Types: Chew   Tobacco  comments:    1 can/daily  Vaping Use   Vaping status: Never Used  Substance Use Topics   Alcohol use: Yes    Alcohol/week: 2.0 standard drinks of alcohol    Types: 2 Cans of beer per week    Comment: social, 1x weekly   Drug use: No       OPHTHALMIC EXAM:  Base Eye Exam     Visual Acuity (Snellen - Linear)       Right Left   Dist [place] 20/40 +1 20/60 +1   Dist ph [place] 20/20 20/50 -1         Tonometry (Tonopen, 8:13 AM)       Right Left   Pressure 13 14         Pupils       Pupils Dark Light Shape React APD   Right PERRL 3 2 Round Brisk None   Left PERRL 3 2 Round Brisk None         Visual Fields       Left Right    Full Full         Extraocular Movement       Right Left    Full, Ortho Full, Ortho         Neuro/Psych     Oriented x3: Yes   Mood/Affect: Normal         Dilation     Both eyes: 1.0% Mydriacyl, 2.5% Phenylephrine @ 8:13 AM           Slit Lamp and Fundus Exam     Slit Lamp Exam       Right Left   Lids/Lashes Dermatochalasis - upper lid, Telangiectasia, mild Meibomian gland dysfunction Dermatochalasis - upper lid, Telangiectasia, mild Meibomian gland dysfunction   Conjunctiva/Sclera White and  quiet Mild nasal Pinguecula   Cornea Mild Arcus, Well healed temporal cataract wounds Arcus, Well healed temporal cataract wounds, trace PEE   Anterior Chamber Deep and clear Deep and clear   Iris Round and dilated, mild patch of atrophy at 0800, no NVI Round and dilated, No NVI   Lens PC IOL in good position PC IOL in good position   Anterior Vitreous Vitreous syneresis Vitreous syneresis         Fundus Exam       Right Left   Disc trace pallor, Sharp rim +Pallor, Sharp rim, Compact, mild PPP temporal   C/D Ratio 0.3 0.3   Macula Good foveal reflex, scattered IRH/DBH -- greatest temporal macula, trace cystic changes temporal mac -- slightly improved, mild focal laser changes Blunted foveal reflex, +central atrophy and pigment clumping, scattered DBH and Mas -- improved, scattered punctate exudates - greatest superior and temporal macula -- persistent, cystic changes / edema temporal macula -- slightly increased   Vessels attenuated, Tortuous attenuated, Tortuous   Periphery Attached, scattered DBH greatest posteriorly, prominent blot heme along SN arcades Attached, pigmented choroidal nevus superiorly with mild elevation, +drusen, no SRF or orange pigment -- unchanged from prior, scattered MA/DBH           IMAGING AND PROCEDURES  Imaging and Procedures for @TODAY @  OCT, Retina - OU - Both Eyes       Right Eye Quality was good. Central Foveal Thickness: 239. Progression has improved. Findings include normal foveal contour, no SRF, intraretinal hyper-reflective material, intraretinal fluid, outer retinal atrophy, vitreomacular adhesion (Mild interval improvement in IRF/IRHM ST macula ).   Left Eye Quality was good. Central Foveal Thickness:  238. Progression has worsened. Findings include normal foveal contour, no SRF, intraretinal hyper-reflective material, intraretinal fluid, outer retinal atrophy (Persistent IRF/IRHM ST fovea and mac -- slightly increased: persistent central ORA;  Sub-RPE hyper-reflective mass consistent with choroidal nevus -- superior to disc, caught on widefield -- stable from prior).   Notes *Images captured and stored on drive  Diagnosis / Impression: +DME OU OD: mild interval improvement in IRF/IRHM  ST macula  OS: Persistent IRF/IRHM ST fovea and mac -- slightly increased: persistent central ORA; Sub-RPE hyper-reflective mass consistent with choroidal nevus -- superior to disc, caught on widefield -- stable from prior  Clinical management:  See below  Abbreviations: NFP - Normal foveal profile. CME - cystoid macular edema. PED - pigment epithelial detachment. IRF - intraretinal fluid. SRF - subretinal fluid. EZ - ellipsoid zone. ERM - epiretinal membrane. ORA - outer retinal atrophy. ORT - outer retinal tubulation. SRHM - subretinal hyper-reflective material      Intravitreal Injection, Pharmacologic Agent - OS - Left Eye       Time Out 06/08/2023. 8:50 AM. Confirmed correct patient, procedure, site, and patient consented.   Anesthesia Topical anesthesia was used. Anesthetic medications included Lidocaine  2%, Proparacaine 0.5%.   Procedure Preparation included 5% betadine  to ocular surface, eyelid speculum. A supplied (32g) needle was used.   Injection: 6 mg faricimab -svoa 6 MG/0.05ML   Route: Intravitreal, Site: Left Eye   NDC: 16109-604-54, Lot: [id number], Expiration date: 05/31/2024, Waste: 0 mL   Post-op Post injection exam found visual acuity of at least counting fingers. The patient tolerated the procedure well. There were no complications. The patient received written and verbal post procedure care education. Post injection medications were not given.            ASSESSMENT/PLAN:    ICD-10-CM   1. Severe nonproliferative diabetic retinopathy of both eyes with macular edema associated with type 2 diabetes mellitus (HCC)  E11.3413 OCT, Retina - OU - Both Eyes    Intravitreal Injection, Pharmacologic Agent - OS - Left Eye     faricimab -svoa (VABYSMO ) 6mg /0.55mL intravitreal injection    CANCELED: Intravitreal Injection, Pharmacologic Agent - OD - Right Eye    2. Current use of insulin  (HCC)  Z79.4     3. Long term (current) use of oral hypoglycemic drugs  Z79.84     4. 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 Essential hypertension  I10     6. Hypertensive retinopathy of both eyes  H35.033     7. Pseudophakia of both eyes  Z96.1     8. Ocular hypertension of right eye  H40.051     9. Diplopia  H53.2      1-3. Severe nonproliferative diabetic retinopathy with DME, OU (OS>OD)  - pt is delayed to follow up from 5 weeks to 10.5 weeks due to L shoulder/rotator cuff surgery (02.21.25 - 05.07.25)  - A1C 6.3 (07.26.24) - s/p IVA OD #1 (06.26.20), #2 (08.14.20), #3 (09.17.20), #4 (10.16.20), #5 (02.02.22)  - s/p IVA OS #1 (07.17.20), #2 (08.14.20), #3 (09.17.20)  **IVA RESISTANCE OU**  ====================  - s/p focal laser OD (07.18.23) - s/p IVE OD #1 (11.13.20), #2 (12.11.20), #3 (01.18.21), #4 (02.19.21), #5 (03.19.21), #6 (04.16.21), #7 (05.14.21), #8 (06.17.21), #9 (07.16.21), #10 (09.21.21), #11 (10.29.21), #12 (11.29.21), #13 (12.29.21), #14 (03.09.22), #15 (04.06.22), #16 (05.11.12), #17 (06.15.22), #18 (7.20.22), #19 (08.24.22), #20 (10.04.22), #21 (11.08.22), #22 (12.13.22), #23 (02.21.23), #24 (03.28.23), #25 (05.09.23), #26 (06.20.23), #27 (08.01.23)  ===================== -  s/p IVE OS #1 (10.16.20) - sample, #2 (11.13.20), #3 (12.11.20), #4 (01.18.21), #5 (02.19.21), #6 (03.19.21), #7 (04.16.21), #8 (05.14.21), #9 (06.17.21), #10 (7.16.21), #11 (09.21.21), #12 (10.29.21), #13 (11.29.21), #14 (12.29.21), #15 (02.02.22, sample), #16 (03.09.22), #17 (04.06.22), #18 (05.11.2012), #19 (06.15.22), #20 (7.20.22), #21 (08.24.22), #22 (10.4.22), #23 (11.8.22), #24 (12.13.22), #25 (01.17.23), #26 (02.21.23), #27 (03.28.23). #28 (05.09.23), #29 (06.20.23), #30 (08.01.23), #31 (09.12.23), #32 (10.24.23), #33  (12.05.23) -- IVE resistance ==================== - s/p IVV OD #1 (11.08.24), #2 (12.13.24), #3 (12.13.24) #4 (01.17.25), #5 (02.21.25) - s/p IVV OS #1 (01.16.24), #2 (02.20.24), #3 (03.26.24), #4 (04.30.24), #5 (06.04.24), #6 (07.09.24), #7 (08.14.24), #8 (10.03.24), #9 (11.08.24), #10 (12.13.24), #11 (12.13.24), #12(01.17.25), #13 (02.21.25) - FA (06.26.20) shows Severe NPDR OU w/ Late leaking MA OU, Enlarged FAZ OU, No NV OU, No significant hyperfluorescence of disc  - BCVA OD improved to 20/20 from 20/25; OS stable at 20/50 - OCT shows OD: mild interval improvement in IRF/IRHM  ST macula, OS: Persistent IRF/IRHM ST fovea and mac -- slightly increased: persistent central ORA; Sub-RPE hyper-reflective mass consistent with choroidal nevus -- superior to disc, caught on widefield -- stable from prior at 10.5 weeks  - recommend IVV OS #14 today, 05.07.25 w/ f/u in 4 wks - pt in agreement  - pt wishes to hold tx in OD today -- reasonable  - RBA of procedure discussed, questions answered  - see procedure note  - Vabysmo  informed consent form signed and scanned, 11.08.24 (OU)  - Vabysmo  auth approved for 2025  - insurance will only approve one med at a time -- currently approved for Vabysmo   - f/u 4 weeks, DFE, OCT, possible injections  4. Choroidal Nevus OS  - located at 1200, mild elevation, +drusen, no SRF  - baseline optos pictures obtained, 06.26.20  - discussed possible referral to oncology at [facility] in [place]  5,6. Hypertensive retinopathy OU  - discussed importance of tight BP control  - monitor  7. Pseudophakia OU  - s/p CE/IOL OU (OD on 06.03.20 and OS 06.10.20 by Dr. Gennie Kicks)  - beautiful surgeries w/ IOLs in excellent position  - macular edema limiting vision as above  - monitor  8. Ocular hypertension   - IOP 13,14  - cont Cosopt  qd OU  - monitor  9. Intermittent diplopia - pt reports intermittent episodes of diplopia with both vertical and horizontal  components; no rotational - pt had appt with Dr. Maris Sickle on 11.9.23 -- noted some improvement with prism  but did not recommend getting the prism  at this time  - pt feels diplopia is improved -- episodes less frequent   Ophthalmic Meds Ordered this visit:  Meds ordered this encounter  Medications   faricimab -svoa (VABYSMO ) 6mg /0.70mL intravitreal injection     Return in about 4 weeks (around 07/06/2023) for f/u NPDR OU, DFE, OCT.  There are no Patient Instructions on file for this visit.  This document serves as a record of services personally performed by Jeanice Millard, MD, PhD. It was created on their behalf by Angelia Kelp, an ophthalmic technician. The creation of this record is the provider's dictation and/or activities during the visit.    Electronically signed by: Angelia Kelp, OA, 06/08/23  6:18 PM  This document serves as a record of services personally performed by Jeanice Millard, MD, PhD. It was created on their behalf by Morley Arabia. Bevin Bucks, OA an ophthalmic technician. The creation of this record is the provider's dictation and/or activities during the  visit.    Electronically signed by: Morley Arabia. Bevin Bucks, OA 06/08/23 6:18 PM  Jeanice Millard, M.D., Ph.D. Diseases & Surgery of the Retina and Vitreous [facility] 06/08/2023   I have reviewed the above documentation for accuracy and completeness, and I agree with the above. Jeanice Millard, M.D., Ph.D. 06/08/23 6:22 PM   Abbreviations: M myopia (nearsighted); A astigmatism; H hyperopia (farsighted); P presbyopia; Mrx spectacle prescription;  CTL contact lenses; OD right eye; OS left eye; OU both eyes  XT exotropia; ET esotropia; PEK punctate epithelial keratitis; PEE punctate epithelial erosions; DES dry eye syndrome; MGD meibomian gland dysfunction; ATs artificial tears; PFAT's preservative free artificial tears; NSC nuclear sclerotic cataract; PSC posterior subcapsular cataract; ERM epi-retinal  membrane; PVD posterior vitreous detachment; RD retinal detachment; DM diabetes mellitus; DR diabetic retinopathy; NPDR non-proliferative diabetic retinopathy; PDR proliferative diabetic retinopathy; CSME clinically significant macular edema; DME diabetic macular edema; dbh dot blot hemorrhages; CWS cotton wool spot; POAG primary open angle glaucoma; C/D cup-to-disc ratio; HVF humphrey visual field; GVF goldmann visual field; OCT optical coherence tomography; IOP intraocular pressure; BRVO Branch retinal vein occlusion; CRVO central retinal vein occlusion; CRAO central retinal artery occlusion; BRAO branch retinal artery occlusion; RT retinal tear; SB scleral buckle; PPV pars plana vitrectomy; VH Vitreous hemorrhage; PRP panretinal laser photocoagulation; IVK intravitreal kenalog; VMT vitreomacular traction; MH Macular hole;  NVD neovascularization of the disc; NVE neovascularization elsewhere; AREDS age related eye disease study; ARMD age related macular degeneration; POAG primary open angle glaucoma; EBMD epithelial/anterior basement membrane dystrophy; ACIOL anterior chamber intraocular lens; IOL intraocular lens; PCIOL posterior chamber intraocular lens; Phaco/IOL phacoemulsification with intraocular lens placement; PRK photorefractive keratectomy; LASIK laser assisted in situ keratomileusis; HTN hypertension; DM diabetes mellitus; COPD chronic obstructive pulmonary disease

## 2023-06-08 ENCOUNTER — Ambulatory Visit (INDEPENDENT_AMBULATORY_CARE_PROVIDER_SITE_OTHER): Admitting: Ophthalmology

## 2023-06-08 ENCOUNTER — Encounter (INDEPENDENT_AMBULATORY_CARE_PROVIDER_SITE_OTHER): Payer: Self-pay | Admitting: Ophthalmology

## 2023-06-08 DIAGNOSIS — Z794 Long term (current) use of insulin: Secondary | ICD-10-CM

## 2023-06-08 DIAGNOSIS — E113413 Type 2 diabetes mellitus with severe nonproliferative diabetic retinopathy with macular edema, bilateral: Secondary | ICD-10-CM | POA: Diagnosis not present

## 2023-06-08 DIAGNOSIS — D3132 Benign neoplasm of left choroid: Secondary | ICD-10-CM

## 2023-06-08 DIAGNOSIS — H40051 Ocular hypertension, right eye: Secondary | ICD-10-CM

## 2023-06-08 DIAGNOSIS — H532 Diplopia: Secondary | ICD-10-CM

## 2023-06-08 DIAGNOSIS — Z961 Presence of intraocular lens: Secondary | ICD-10-CM

## 2023-06-08 DIAGNOSIS — I10 Essential (primary) hypertension: Secondary | ICD-10-CM

## 2023-06-08 DIAGNOSIS — Z7984 Long term (current) use of oral hypoglycemic drugs: Secondary | ICD-10-CM

## 2023-06-08 DIAGNOSIS — H35033 Hypertensive retinopathy, bilateral: Secondary | ICD-10-CM

## 2023-06-08 MED ORDER — FARICIMAB-SVOA 6 MG/0.05ML IZ SOSY
6.0000 mg | PREFILLED_SYRINGE | INTRAVITREAL | Status: AC | PRN
  Administered 2023-06-08: 6 mg via INTRAVITREAL

## 2023-06-14 DIAGNOSIS — M25612 Stiffness of left shoulder, not elsewhere classified: Secondary | ICD-10-CM | POA: Diagnosis not present

## 2023-06-14 DIAGNOSIS — M25512 Pain in left shoulder: Secondary | ICD-10-CM | POA: Diagnosis not present

## 2023-06-21 DIAGNOSIS — M25512 Pain in left shoulder: Secondary | ICD-10-CM | POA: Diagnosis not present

## 2023-06-21 DIAGNOSIS — M25612 Stiffness of left shoulder, not elsewhere classified: Secondary | ICD-10-CM | POA: Diagnosis not present

## 2023-06-28 DIAGNOSIS — M25612 Stiffness of left shoulder, not elsewhere classified: Secondary | ICD-10-CM | POA: Diagnosis not present

## 2023-06-28 DIAGNOSIS — M25512 Pain in left shoulder: Secondary | ICD-10-CM | POA: Diagnosis not present

## 2023-06-29 NOTE — Progress Notes (Shared)
 [facility] - Clinic Note  07/06/2023     CHIEF COMPLAINT Patient presents for No chief complaint on file.   HISTORY OF PRESENT ILLNESS: Juan [name] is a 63 y.o. male who presents to the clinic today for:       Referring physician: Victorio Grave, MD [phone] [address] [place],  [place] [address]  HISTORICAL INFORMATION:   Selected notes from the MEDICAL RECORD NUMBER Referred by Dr. Albert Huff for concern of CME s/p cataract sx   CURRENT MEDICATIONS: Current Outpatient Medications (Ophthalmic Drugs)  Medication Sig   dorzolamide -timolol  (COSOPT ) 22.3-6.8 MG/ML ophthalmic solution Place 1 drop into the right eye 2 (two) times daily. (Patient not taking: Reported on 06/08/2023)   No current facility-administered medications for this visit. (Ophthalmic Drugs)   Current Outpatient Medications (Other)  Medication Sig   ADVAIR DISKUS 500-50 MCG/DOSE AEPB Inhale 1 puff into the lungs 2 (two) times daily. (Patient not taking: Reported on 06/08/2023)   albuterol  (VENTOLIN  HFA) 108 (90 Base) MCG/ACT inhaler Inhale 1-2 puffs into the lungs every 6 (six) hours as needed for wheezing or shortness of breath.   alfuzosin (UROXATRAL) 10 MG 24 hr tablet TAKE 1 TABLET BY MOUTH EVERYDAY AT BEDTIME   aspirin  EC 81 MG tablet Take 81 mg by mouth daily.   atorvastatin  (LIPITOR) 40 MG tablet Take 1 tablet (40 mg total) by mouth daily.   carvedilol  (COREG ) 12.5 MG tablet Take 1 tablet (12.5 mg total) by mouth 2 (two) times daily with a meal.   Empagliflozin-metFORMIN HCl (SYNJARDY) 12.06-998 MG TABS Take 1 tablet by mouth 2 (two) times a day.    esomeprazole (NEXIUM) 40 MG capsule Take 40 mg by mouth daily at 12 noon.   famotidine  (PEPCID ) 40 MG tablet Take by mouth. (Patient not taking: Reported on 06/08/2023)   insulin  glargine, 2 Unit Dial, (TOUJEO MAX SOLOSTAR) 300 UNIT/ML Solostar Pen Inject 40 Units into the skin daily.    iron polysaccharides (NIFEREX) 150 MG  capsule Take 150 mg by mouth daily.   isosorbide  mononitrate (IMDUR ) 30 MG 24 hr tablet Take 1 tablet (30 mg total) by mouth daily.   lisinopril  (ZESTRIL ) 40 MG tablet Take 1 tablet (40 mg total) by mouth daily.   nitroGLYCERIN (NITROSTAT) 0.4 MG SL tablet Place 0.4 mg under the tongue daily.   OZEMPIC, 0.25 OR 0.5 MG/DOSE, 2 MG/3ML SOPN Inject 0.25 mg into the skin once a week. (Patient not taking: Reported on 06/08/2023)   WIXELA INHUB 250-50 MCG/ACT AEPB Inhale 1 puff into the lungs 2 (two) times daily.   No current facility-administered medications for this visit. (Other)   REVIEW OF SYSTEMS:   ALLERGIES Allergies  Allergen Reactions   Naproxen Shortness Of Breath   Naproxen Sodium Shortness Of Breath   Tiotropium Bromide Monohydrate  Shortness Of Breath   Penicillins Rash    Has patient had a PCN reaction causing immediate rash, facial/tongue/throat swelling, SOB or lightheadedness with hypotension: Yes Has patient had a PCN reaction causing severe rash involving mucus membranes or skin necrosis: No Has patient had a PCN reaction that required hospitalization No Has patient had a PCN reaction occurring within the last 10 years: No If all of the above answers are "NO", then may proceed with Cephalosporin use.  Has patient had a PCN reaction causing immediate rash, facial/tongue/throat swelling, SOB or lightheadedness with hypotension: Yes Has patient had a PCN reaction causing severe rash involving mucus membranes or skin necrosis:  No Has patient had a PCN reaction that required hospitalization No Has patient had a PCN reaction occurring within the last 10 years: No If all of the above answers are "NO", then may proceed with Cephalosporin use.    PAST MEDICAL HISTORY Past Medical History:  Diagnosis Date   Arthritis    Asthma    COPD (chronic obstructive pulmonary disease) (HCC)    Diabetes mellitus    Type 2   Diabetic retinopathy (HCC)    NPDR OU   GERD (gastroesophageal  reflux disease)    H/O hiatal hernia    History of kidney stones    Hyperlipemia    Hypertension    Hypertensive retinopathy    OU   Nasal polyps    Pneumonia 2014   Shortness of breath    Sleep apnea    pt has CPAP but is unable to use it d/t having polyps in his nose. Pt stated "I need to call and get a CPAP mask instead"   Stomach ulcer    Past Surgical History:  Procedure Laterality Date   CATARACT EXTRACTION Right 07/05/2018   Dr. Gennie Kicks   CATARACT EXTRACTION Left 07/12/2018   Dr. Gennie Kicks   CHOLECYSTECTOMY N/A 01/23/2015   Procedure: LAPAROSCOPIC CHOLECYSTECTOMY ;  Surgeon: Dorena Gander, MD;  Location: [facility] OR;  Service: General;  Laterality: N/A;   COLONOSCOPY W/ POLYPECTOMY     ESOPHAGOGASTRODUODENOSCOPY     EYE SURGERY     KNEE ARTHROSCOPY  03/24/2011   Procedure: ARTHROSCOPY KNEE;  Surgeon: Boston Byers, MD;  Location: [facility];  Service: Orthopedics;  Laterality: Right;  partial medial menisectomy, chondroplaty patella, and medial medial plica   LEFT HEART CATH AND CORONARY ANGIOGRAPHY N/A 06/29/2019   Procedure: LEFT HEART CATH AND CORONARY ANGIOGRAPHY;  Surgeon: Wenona Hamilton, MD;  Location: [facility];  Service: Cardiovascular;  Laterality: N/A;   FAMILY HISTORY Family History  Problem Relation Age of Onset   Diabetes Father    Coronary artery disease Father    SOCIAL HISTORY Social History   Tobacco Use   Smoking status: Former    Current packs/day: 3.00    Average packs/day: 3.0 packs/day for 20.0 years (60.0 ttl pk-yrs)    Types: Cigarettes   Smokeless tobacco: Current    Types: Chew   Tobacco comments:    1 can/daily  Vaping Use   Vaping status: Never Used  Substance Use Topics   Alcohol use: Yes    Alcohol/week: 2.0 standard drinks of alcohol    Types: 2 Cans of beer per week    Comment: social, 1x weekly   Drug use: No       OPHTHALMIC EXAM:  Not recorded    IMAGING AND PROCEDURES  Imaging and Procedures  for @TODAY @          ASSESSMENT/PLAN:  No diagnosis found.  1-3. Severe nonproliferative diabetic retinopathy with DME, OU (OS>OD)  - pt is delayed to follow up from 5 weeks to 10.5 weeks due to L shoulder/rotator cuff surgery (02.21.25 - 05.07.25)  - A1C 6.3 (07.26.24) - s/p IVA OD #1 (06.26.20), #2 (08.14.20), #3 (09.17.20), #4 (10.16.20), #5 (02.02.22)  - s/p IVA OS #1 (07.17.20), #2 (08.14.20), #3 (09.17.20)  **IVA RESISTANCE OU**  ====================  - s/p focal laser OD (07.18.23) - s/p IVE OD #1 (11.13.20), #2 (12.11.20), #3 (01.18.21), #4 (02.19.21), #5 (03.19.21), #6 (04.16.21), #7 (05.14.21), #8 (06.17.21), #9 (07.16.21), #10 (09.21.21), #11 (10.29.21), #12 (11.29.21), #13 (  12.29.21), #14 (03.09.22), #15 (04.06.22), #16 (05.11.12), #17 (06.15.22), #18 (7.20.22), #19 (08.24.22), #20 (10.04.22), #21 (11.08.22), #22 (12.13.22), #23 (02.21.23), #24 (03.28.23), #25 (05.09.23), #26 (06.20.23), #27 (08.01.23)  ===================== - s/p IVE OS #1 (10.16.20) - sample, #2 (11.13.20), #3 (12.11.20), #4 (01.18.21), #5 (02.19.21), #6 (03.19.21), #7 (04.16.21), #8 (05.14.21), #9 (06.17.21), #10 (7.16.21), #11 (09.21.21), #12 (10.29.21), #13 (11.29.21), #14 (12.29.21), #15 (02.02.22, sample), #16 (03.09.22), #17 (04.06.22), #18 (05.11.2012), #19 (06.15.22), #20 (7.20.22), #21 (08.24.22), #22 (10.4.22), #23 (11.8.22), #24 (12.13.22), #25 (01.17.23), #26 (02.21.23), #27 (03.28.23). #28 (05.09.23), #29 (06.20.23), #30 (08.01.23), #31 (09.12.23), #32 (10.24.23), #33 (12.05.23) -- IVE resistance ==================== - s/p IVV OD #1 (11.08.24), #2 (12.13.24), #3 (12.13.24) #4 (01.17.25), #5 (02.21.25) - s/p IVV OS #1 (01.16.24), #2 (02.20.24), #3 (03.26.24), #4 (04.30.24), #5 (06.04.24), #6 (07.09.24), #7 (08.14.24), #8 (10.03.24), #9 (11.08.24), #10 (12.13.24), #11 (12.13.24), #12(01.17.25), #13 (02.21.25), #14 (05.07.25),  - FA (06.26.20) shows Severe NPDR OU w/ Late leaking MA OU, Enlarged FAZ  OU, No NV OU, No significant hyperfluorescence of disc  - BCVA OD improved to 20/20 from 20/25; OS stable at 20/50 - OCT shows OD: mild interval improvement in IRF/IRHM  ST macula, OS: Persistent IRF/IRHM ST fovea and mac -- slightly increased: persistent central ORA; Sub-RPE hyper-reflective mass consistent with choroidal nevus -- superior to disc, caught on widefield -- stable from prior at 10.5 weeks  - recommend IVV OS #15 today, 06.04.25 w/ f/u in 4 wks - pt in agreement  - pt wishes to hold tx in OD today -- reasonable  - RBA of procedure discussed, questions answered  - see procedure note  - Vabysmo  informed consent form signed and scanned, 11.08.24 (OU)  - Vabysmo  auth approved for 2025  - insurance will only approve one med at a time -- currently approved for Vabysmo   - f/u 4 weeks, DFE, OCT, possible injections  4. Choroidal Nevus OS  - located at 1200, mild elevation, +drusen, no SRF  - baseline optos pictures obtained, 06.26.20  - discussed possible referral to oncology at [facility] in [place]  5,6. Hypertensive retinopathy OU  - discussed importance of tight BP control  - monitor  7. Pseudophakia OU  - s/p CE/IOL OU (OD on 06.03.20 and OS 06.10.20 by Dr. Gennie Kicks)  - beautiful surgeries w/ IOLs in excellent position  - macular edema limiting vision as above  - monitor  8. Ocular hypertension   - IOP 13,14  - cont Cosopt  qd OU  - monitor  9. Intermittent diplopia - pt reports intermittent episodes of diplopia with both vertical and horizontal components; no rotational - pt had appt with Dr. Maris Sickle on 11.9.23 -- noted some improvement with prism  but did not recommend getting the prism  at this time  - pt feels diplopia is improved -- episodes less frequent   Ophthalmic Meds Ordered this visit:  No orders of the defined types were placed in this encounter.    No follow-ups on file.  There are no Patient Instructions on file for this visit.  This  document serves as a record of services personally performed by Jeanice Millard, MD, PhD. It was created on their behalf by Angelia Kelp, an ophthalmic technician. The creation of this record is the provider's dictation and/or activities during the visit.    Electronically signed by: Angelia Kelp, OA, 06/29/23  1:44 PM    Jeanice Millard, M.D., Ph.D. Diseases & Surgery of the Retina and Vitreous [facility] 06/08/2023    Abbreviations: M myopia (nearsighted); A astigmatism; H hyperopia (farsighted); P presbyopia; Mrx spectacle prescription;  CTL contact lenses; OD right eye; OS left eye; OU both eyes  XT exotropia; ET esotropia; PEK punctate epithelial keratitis; PEE punctate epithelial erosions; DES dry eye syndrome; MGD meibomian gland dysfunction; ATs artificial tears; PFAT's preservative free artificial tears; NSC nuclear sclerotic cataract; PSC posterior subcapsular cataract; ERM epi-retinal membrane; PVD posterior vitreous detachment; RD retinal detachment; DM diabetes mellitus; DR diabetic retinopathy; NPDR non-proliferative diabetic retinopathy; PDR proliferative diabetic retinopathy; CSME clinically significant macular edema; DME diabetic macular edema; dbh dot blot hemorrhages; CWS cotton wool spot; POAG primary open angle glaucoma; C/D cup-to-disc ratio; HVF humphrey visual field; GVF goldmann visual field; OCT optical coherence tomography; IOP intraocular pressure; BRVO Branch retinal vein occlusion; CRVO central retinal vein occlusion; CRAO central retinal artery occlusion; BRAO branch retinal artery occlusion; RT retinal tear; SB scleral buckle; PPV pars plana vitrectomy; VH Vitreous hemorrhage; PRP panretinal laser photocoagulation; IVK intravitreal kenalog; VMT vitreomacular traction; MH Macular hole;  NVD neovascularization of the disc; NVE neovascularization elsewhere; AREDS age related eye disease study; ARMD age related macular degeneration; POAG  primary open angle glaucoma; EBMD epithelial/anterior basement membrane dystrophy; ACIOL anterior chamber intraocular lens; IOL intraocular lens; PCIOL posterior chamber intraocular lens; Phaco/IOL phacoemulsification with intraocular lens placement; PRK photorefractive keratectomy; LASIK laser assisted in situ keratomileusis; HTN hypertension; DM diabetes mellitus; COPD chronic obstructive pulmonary disease

## 2023-07-06 ENCOUNTER — Encounter (INDEPENDENT_AMBULATORY_CARE_PROVIDER_SITE_OTHER): Admitting: Ophthalmology

## 2023-07-06 DIAGNOSIS — Z961 Presence of intraocular lens: Secondary | ICD-10-CM

## 2023-07-06 DIAGNOSIS — Z794 Long term (current) use of insulin: Secondary | ICD-10-CM

## 2023-07-06 DIAGNOSIS — M25612 Stiffness of left shoulder, not elsewhere classified: Secondary | ICD-10-CM | POA: Diagnosis not present

## 2023-07-06 DIAGNOSIS — H40051 Ocular hypertension, right eye: Secondary | ICD-10-CM

## 2023-07-06 DIAGNOSIS — M25512 Pain in left shoulder: Secondary | ICD-10-CM | POA: Diagnosis not present

## 2023-07-06 DIAGNOSIS — H532 Diplopia: Secondary | ICD-10-CM

## 2023-07-06 DIAGNOSIS — D3132 Benign neoplasm of left choroid: Secondary | ICD-10-CM

## 2023-07-06 DIAGNOSIS — Z7984 Long term (current) use of oral hypoglycemic drugs: Secondary | ICD-10-CM

## 2023-07-06 DIAGNOSIS — I10 Essential (primary) hypertension: Secondary | ICD-10-CM

## 2023-07-06 DIAGNOSIS — E113413 Type 2 diabetes mellitus with severe nonproliferative diabetic retinopathy with macular edema, bilateral: Secondary | ICD-10-CM

## 2023-07-06 DIAGNOSIS — H35033 Hypertensive retinopathy, bilateral: Secondary | ICD-10-CM

## 2023-07-13 DIAGNOSIS — M25612 Stiffness of left shoulder, not elsewhere classified: Secondary | ICD-10-CM | POA: Diagnosis not present

## 2023-07-13 DIAGNOSIS — M25512 Pain in left shoulder: Secondary | ICD-10-CM | POA: Diagnosis not present

## 2023-07-20 DIAGNOSIS — M25612 Stiffness of left shoulder, not elsewhere classified: Secondary | ICD-10-CM | POA: Diagnosis not present

## 2023-07-20 DIAGNOSIS — M25512 Pain in left shoulder: Secondary | ICD-10-CM | POA: Diagnosis not present

## 2023-07-26 NOTE — Progress Notes (Signed)
 [facility] - Clinic Note  07/28/2023     CHIEF COMPLAINT Patient presents for Retina Follow Up   HISTORY OF PRESENT ILLNESS: Juan [name] is a 63 y.o. male who presents to the clinic today for:   HPI     Retina Follow Up   Patient presents with  Diabetic Retinopathy.  In both eyes.  This started 5 years ago.  Severity is severe.  Duration of 7 weeks.  Since onset it is stable.  I, the attending physician,  performed the HPI with the patient and updated documentation appropriately.        Comments   Patient states no changes in vision, the blind spot in the left eye may be a little better. Pt denies FOL/floaters/discomfort. Pts last A1c was 8.1 about 8 months ago, next visit in August. Pt states BS was 122 fasting, yesterday. Pt does not use any ats.      Last edited by Valdemar Rogue, MD on 07/28/2023 12:01 PM.      Referring physician: Teresa Channel, MD [phone] [address] [place],  [place] [address]  HISTORICAL INFORMATION:   Selected notes from the MEDICAL RECORD NUMBER Referred by Dr. Oneil Platts for concern of CME s/p cataract sx   CURRENT MEDICATIONS: Current Outpatient Medications (Ophthalmic Drugs)  Medication Sig   dorzolamide -timolol  (COSOPT ) 22.3-6.8 MG/ML ophthalmic solution Place 1 drop into the right eye 2 (two) times daily. (Patient not taking: Reported on 07/28/2023)   No current facility-administered medications for this visit. (Ophthalmic Drugs)   Current Outpatient Medications (Other)  Medication Sig   albuterol  (VENTOLIN  HFA) 108 (90 Base) MCG/ACT inhaler Inhale 1-2 puffs into the lungs every 6 (six) hours as needed for wheezing or shortness of breath.   alfuzosin (UROXATRAL) 10 MG 24 hr tablet TAKE 1 TABLET BY MOUTH EVERYDAY AT BEDTIME   aspirin  EC 81 MG tablet Take 81 mg by mouth daily.   atorvastatin  (LIPITOR) 40 MG tablet Take 1 tablet (40 mg total) by mouth daily.   carvedilol  (COREG ) 12.5 MG tablet Take 1  tablet (12.5 mg total) by mouth 2 (two) times daily with a meal.   Empagliflozin-metFORMIN HCl (SYNJARDY) 12.06-998 MG TABS Take 1 tablet by mouth 2 (two) times a day.    esomeprazole (NEXIUM) 40 MG capsule Take 40 mg by mouth daily at 12 noon.   insulin  glargine, 2 Unit Dial, (TOUJEO MAX SOLOSTAR) 300 UNIT/ML Solostar Pen Inject 40 Units into the skin daily.    iron polysaccharides (NIFEREX) 150 MG capsule Take 150 mg by mouth daily.   isosorbide  mononitrate (IMDUR ) 30 MG 24 hr tablet Take 1 tablet (30 mg total) by mouth daily.   lisinopril  (ZESTRIL ) 40 MG tablet Take 1 tablet (40 mg total) by mouth daily.   nitroGLYCERIN (NITROSTAT) 0.4 MG SL tablet Place 0.4 mg under the tongue daily.   WIXELA INHUB 250-50 MCG/ACT AEPB Inhale 1 puff into the lungs 2 (two) times daily.   ADVAIR DISKUS 500-50 MCG/DOSE AEPB Inhale 1 puff into the lungs 2 (two) times daily. (Patient not taking: Reported on 07/28/2023)   famotidine  (PEPCID ) 40 MG tablet Take by mouth. (Patient not taking: Reported on 07/28/2023)   OZEMPIC, 0.25 OR 0.5 MG/DOSE, 2 MG/3ML SOPN Inject 0.25 mg into the skin once a week. (Patient not taking: Reported on 07/28/2023)   No current facility-administered medications for this visit. (Other)   REVIEW OF SYSTEMS: ROS   Positive for: Gastrointestinal, Musculoskeletal, Endocrine, Eyes, Respiratory  Negative for: Constitutional, Neurological, Skin, Genitourinary, HENT, Cardiovascular, Psychiatric, Allergic/Imm, Heme/Lymph Last edited by Elnor Avelina RAMAN, COT on 07/28/2023  8:02 AM.      ALLERGIES Allergies  Allergen Reactions   Naproxen Shortness Of Breath   Naproxen Sodium Shortness Of Breath   Tiotropium Bromide Monohydrate  Shortness Of Breath   Penicillins Rash    Has patient had a PCN reaction causing immediate rash, facial/tongue/throat swelling, SOB or lightheadedness with hypotension: Yes Has patient had a PCN reaction causing severe rash involving mucus membranes or skin necrosis:  No Has patient had a PCN reaction that required hospitalization No Has patient had a PCN reaction occurring within the last 10 years: No If all of the above answers are NO, then may proceed with Cephalosporin use.  Has patient had a PCN reaction causing immediate rash, facial/tongue/throat swelling, SOB or lightheadedness with hypotension: Yes Has patient had a PCN reaction causing severe rash involving mucus membranes or skin necrosis: No Has patient had a PCN reaction that required hospitalization No Has patient had a PCN reaction occurring within the last 10 years: No If all of the above answers are NO, then may proceed with Cephalosporin use.    PAST MEDICAL HISTORY Past Medical History:  Diagnosis Date   Arthritis    Asthma    COPD (chronic obstructive pulmonary disease) (HCC)    Diabetes mellitus    Type 2   Diabetic retinopathy (HCC)    NPDR OU   GERD (gastroesophageal reflux disease)    H/O hiatal hernia    History of kidney stones    Hyperlipemia    Hypertension    Hypertensive retinopathy    OU   Nasal polyps    Pneumonia 2014   Shortness of breath    Sleep apnea    pt has CPAP but is unable to use it d/t having polyps in his nose. Pt stated I need to call and get a CPAP mask instead   Stomach ulcer    Past Surgical History:  Procedure Laterality Date   CATARACT EXTRACTION Right 07/05/2018   Dr. Roz   CATARACT EXTRACTION Left 07/12/2018   Dr. Roz   CHOLECYSTECTOMY N/A 01/23/2015   Procedure: LAPAROSCOPIC CHOLECYSTECTOMY ;  Surgeon: Dann Hummer, MD;  Location: [facility] OR;  Service: General;  Laterality: N/A;   COLONOSCOPY W/ POLYPECTOMY     ESOPHAGOGASTRODUODENOSCOPY     EYE SURGERY     KNEE ARTHROSCOPY  03/24/2011   Procedure: ARTHROSCOPY KNEE;  Surgeon: Norleen LITTIE Gavel, MD;  Location: [facility];  Service: Orthopedics;  Laterality: Right;  partial medial menisectomy, chondroplaty patella, and medial medial plica   LEFT HEART CATH AND  CORONARY ANGIOGRAPHY N/A 06/29/2019   Procedure: LEFT HEART CATH AND CORONARY ANGIOGRAPHY;  Surgeon: Darron Deatrice LABOR, MD;  Location: [facility];  Service: Cardiovascular;  Laterality: N/A;   FAMILY HISTORY Family History  Problem Relation Age of Onset   Diabetes Father    Coronary artery disease Father    SOCIAL HISTORY Social History   Tobacco Use   Smoking status: Former    Current packs/day: 3.00    Average packs/day: 3.0 packs/day for 20.0 years (60.0 ttl pk-yrs)    Types: Cigarettes   Smokeless tobacco: Current    Types: Chew   Tobacco comments:    1 can/daily  Vaping Use   Vaping status: Never Used  Substance Use Topics   Alcohol use: Yes    Alcohol/week: 2.0 standard drinks of alcohol  Types: 2 Cans of beer per week    Comment: social, 1x weekly   Drug use: No       OPHTHALMIC EXAM:  Base Eye Exam     Visual Acuity (Snellen - Linear)       Right Left   Dist [place] 20/30 -2 20/60 +1   Dist ph [place] 20/20 20/50 +2         Tonometry (Tonopen, 8:08 AM)       Right Left   Pressure 15 16         Pupils       Pupils Dark Light Shape React APD   Right PERRL 3 2 Round Brisk None   Left PERRL 3 2 rr Brisk None         Visual Fields       Left Right    Full Full         Extraocular Movement       Right Left    Full Full         Neuro/Psych     Oriented x3: Yes   Mood/Affect: Normal         Dilation     Both eyes: 1.0% Mydriacyl, 2.5% Phenylephrine @ 8:08 AM           Slit Lamp and Fundus Exam     Slit Lamp Exam       Right Left   Lids/Lashes Dermatochalasis - upper lid, Telangiectasia, mild Meibomian gland dysfunction Dermatochalasis - upper lid, Telangiectasia, mild Meibomian gland dysfunction   Conjunctiva/Sclera White and quiet Mild nasal Pinguecula   Cornea Mild Arcus, Well healed temporal cataract wounds Arcus, Well healed temporal cataract wounds, trace PEE   Anterior Chamber Deep and clear Deep and clear    Iris Round and dilated, mild patch of atrophy at 0800, no NVI Round and dilated, No NVI   Lens PC IOL in good position PC IOL in good position   Anterior Vitreous Vitreous syneresis Vitreous syneresis         Fundus Exam       Right Left   Disc trace pallor, Sharp rim +Pallor, Sharp rim, Compact, mild PPP temporal   C/D Ratio 0.3 0.3   Macula Good foveal reflex, scattered IRH/DBH -- greatest temporal macula, trace cystic changes temporal mac -- slightly increased, mild focal laser changes Blunted foveal reflex, +central atrophy and pigment clumping, scattered DBH and Mas -- improved, scattered punctate exudates - greatest superior and temporal macula -- persistent, cystic changes / edema temporal macula   Vessels attenuated, mild tortuosity attenuated, Tortuous   Periphery Attached, scattered DBH greatest posteriorly, prominent blot heme along SN arcades Attached, pigmented choroidal nevus superiorly with mild elevation, +drusen, no SRF or orange pigment -- unchanged from prior, scattered MA/DBH           IMAGING AND PROCEDURES  Imaging and Procedures for @TODAY @  OCT, Retina - OU - Both Eyes       Right Eye Quality was good. Central Foveal Thickness: 240. Progression has worsened. Findings include normal foveal contour, no SRF, intraretinal hyper-reflective material, intraretinal fluid, outer retinal atrophy, vitreomacular adhesion (Mild interval increase in IRF/IRHM ST macula ).   Left Eye Quality was good. Central Foveal Thickness: 203. Progression has worsened. Findings include normal foveal contour, no SRF, intraretinal hyper-reflective material, intraretinal fluid, outer retinal atrophy (Persistent IRF/IRHM ST fovea and mac,: persistent central ORA; Sub-RPE hyper-reflective mass consistent with choroidal nevus -- superior to disc, caught  on widefield -- stable from prior).   Notes *Images captured and stored on drive  Diagnosis / Impression: +DME OU OD: mild interval  increase in IRF/IRHM  ST macula  OS: Persistent IRF/IRHM ST fovea and mac: persistent central ORA; Sub-RPE hyper-reflective mass consistent with choroidal nevus -- superior to disc, caught on widefield -- stable from prior  Clinical management:  See below  Abbreviations: NFP - Normal foveal profile. CME - cystoid macular edema. PED - pigment epithelial detachment. IRF - intraretinal fluid. SRF - subretinal fluid. EZ - ellipsoid zone. ERM - epiretinal membrane. ORA - outer retinal atrophy. ORT - outer retinal tubulation. SRHM - subretinal hyper-reflective material      Intravitreal Injection, Pharmacologic Agent - OS - Left Eye       Time Out 07/28/2023. 8:32 AM. Confirmed correct patient, procedure, site, and patient consented.   Anesthesia Topical anesthesia was used. Anesthetic medications included Lidocaine  2%, Proparacaine 0.5%.   Procedure Preparation included 5% betadine  to ocular surface, eyelid speculum. A supplied (32g) needle was used.   Injection: 6 mg faricimab -svoa 6 MG/0.05ML   Route: Intravitreal, Site: Left Eye   NDC: 49757-903-93, Lot: [id number], Expiration date: 07/01/2024, Waste: 0 mL   Post-op Post injection exam found visual acuity of at least counting fingers. The patient tolerated the procedure well. There were no complications. The patient received written and verbal post procedure care education. Post injection medications were not given.      Intravitreal Injection, Pharmacologic Agent - OD - Right Eye       Time Out 07/28/2023. 8:40 AM. Confirmed correct patient, procedure, site, and patient consented.   Anesthesia Topical anesthesia was used. Anesthetic medications included Lidocaine  2%, Proparacaine 0.5%.   Procedure Preparation included 5% betadine  to ocular surface, eyelid speculum. A supplied (32g) needle was used.   Injection: 6 mg faricimab -svoa 6 MG/0.05ML   Route: Intravitreal, Site: Right Eye   NDC: 49757-903-93, Lot: [id number],  Expiration date: 07/01/2024, Waste: 0 mL   Post-op Post injection exam found visual acuity of at least counting fingers. The patient tolerated the procedure well. There were no complications. The patient received written and verbal post procedure care education.             ASSESSMENT/PLAN:    ICD-10-CM   1. Severe nonproliferative diabetic retinopathy of both eyes with macular edema associated with type 2 diabetes mellitus (HCC)  E11.3413 OCT, Retina - OU - Both Eyes    Intravitreal Injection, Pharmacologic Agent - OS - Left Eye    Intravitreal Injection, Pharmacologic Agent - OD - Right Eye    faricimab -svoa (VABYSMO ) 6mg /0.49mL intravitreal injection    faricimab -svoa (VABYSMO ) 6mg /0.56mL intravitreal injection    2. Current use of insulin  (HCC)  Z79.4     3. Long term (current) use of oral hypoglycemic drugs  Z79.84     4. 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 Essential hypertension  I10     6. Hypertensive retinopathy of both eyes  H35.033     7. Pseudophakia of both eyes  Z96.1     8. Ocular hypertension of right eye  H40.051     9. Diplopia  H53.2      1-3. Severe nonproliferative diabetic retinopathy with DME, OU (OS>OD)  - pt is delayed to follow up from 5 weeks to 10.5 weeks due to L shoulder/rotator cuff surgery (02.21.25 - 05.07.25)  - A1C 6.3 (07.26.24) - s/p IVA OD #1 (06.26.20), #2 (08.14.20), #3 (  09.17.20), #4 (10.16.20), #5 (02.02.22)  - s/p IVA OS #1 (07.17.20), #2 (08.14.20), #3 (09.17.20)  **IVA RESISTANCE OU**  ====================  - s/p focal laser OD (07.18.23) - s/p IVE OD #1 (11.13.20), #2 (12.11.20), #3 (01.18.21), #4 (02.19.21), #5 (03.19.21), #6 (04.16.21), #7 (05.14.21), #8 (06.17.21), #9 (07.16.21), #10 (09.21.21), #11 (10.29.21), #12 (11.29.21), #13 (12.29.21), #14 (03.09.22), #15 (04.06.22), #16 (05.11.12), #17 (06.15.22), #18 (7.20.22), #19 (08.24.22), #20 (10.04.22), #21 (11.08.22), #22 (12.13.22), #23 (02.21.23), #24 (03.28.23), #25  (05.09.23), #26 (06.20.23), #27 (08.01.23)  ===================== - s/p IVE OS #1 (10.16.20) - sample, #2 (11.13.20), #3 (12.11.20), #4 (01.18.21), #5 (02.19.21), #6 (03.19.21), #7 (04.16.21), #8 (05.14.21), #9 (06.17.21), #10 (7.16.21), #11 (09.21.21), #12 (10.29.21), #13 (11.29.21), #14 (12.29.21), #15 (02.02.22, sample), #16 (03.09.22), #17 (04.06.22), #18 (05.11.2012), #19 (06.15.22), #20 (7.20.22), #21 (08.24.22), #22 (10.4.22), #23 (11.8.22), #24 (12.13.22), #25 (01.17.23), #26 (02.21.23), #27 (03.28.23). #28 (05.09.23), #29 (06.20.23), #30 (08.01.23), #31 (09.12.23), #32 (10.24.23), #33 (12.05.23) -- IVE resistance ==================== - s/p IVV OD #1 (11.08.24), #2 (12.13.24), #3 (12.13.24) #4 (01.17.25), #5 (02.21.25) - s/p IVV OS #1 (01.16.24), #2 (02.20.24), #3 (03.26.24), #4 (04.30.24), #5 (06.04.24), #6 (07.09.24), #7 (08.14.24), #8 (10.03.24), #9 (11.08.24), #10 (12.13.24), #11 (12.13.24), #12(01.17.25), #13 (02.21.25), #14 (05.07.25) - FA (06.26.20) shows Severe NPDR OU w/ Late leaking MA OU, Enlarged FAZ OU, No NV OU, No significant hyperfluorescence of disc  - BCVA OD stable at 20/20; OS stable at 20/50 - OCT shows OD: mild interval increase in IRF/IRHM  ST macula, OS: Persistent IRF/IRHM ST fovea and mac: persistent central ORA; Sub-RPE hyper-reflective mass consistent with choroidal nevus -- superior to disc, caught on widefield -- stable from prior at 7 weeks  - recommend IVV OU (OD #6 and OS #15) today, 06.26.25 w/ f/u back to 6 wks - pt in agreement  - pt wishes to hold tx in OD today -- reasonable  - RBA of procedure discussed, questions answered  - see procedure note  - Vabysmo  informed consent form signed and scanned, 11.08.24 (OU)  - Vabysmo  auth approved for 2025 -- Vabysmo  co-pay program  - insurance will only approve one med at a time -- currently approved for Vabysmo   - f/u 6 weeks, DFE, OCT, possible injections  4. Choroidal Nevus OS  - located at 1200, mild  elevation, +drusen, no SRF  - baseline optos pictures obtained, 06.26.20  - discussed possible referral to oncology at [facility] in [place]  5,6. Hypertensive retinopathy OU  - discussed importance of tight BP control  - monitor  7. Pseudophakia OU  - s/p CE/IOL OU (OD on 06.03.20 and OS 06.10.20 by Dr. Roz)  - beautiful surgeries w/ IOLs in excellent position  - macular edema limiting vision as above  - monitor  8. Ocular hypertension   - IOP 15,16  - cont Cosopt  qd OU  - monitor  9. Intermittent diplopia - pt reports intermittent episodes of diplopia with both vertical and horizontal components; no rotational - pt had appt with Dr. Lamar Gaudy on 11.9.23 -- noted some improvement with prism  but did not recommend getting the prism  at this time  - pt feels diplopia is improved -- episodes less frequent   Ophthalmic Meds Ordered this visit:  Meds ordered this encounter  Medications   faricimab -svoa (VABYSMO ) 6mg /0.67mL intravitreal injection   faricimab -svoa (VABYSMO ) 6mg /0.63mL intravitreal injection     Return in about 6 weeks (around 09/08/2023) for f/u NPDR OU, DFE, OCT, Possible Injxn.  There are no Patient Instructions  on file for this visit.  This document serves as a record of services personally performed by Redell JUDITHANN Hans, MD, PhD. It was created on their behalf by Alan PARAS. Delores, OA an ophthalmic technician. The creation of this record is the provider's dictation and/or activities during the visit.    Electronically signed by: Alan PARAS. Delores, OA 07/28/23 12:05 PM   Redell JUDITHANN Hans, M.D., Ph.D. Diseases & Surgery of the Retina and Vitreous [facility] 07/28/2023   I have reviewed the above documentation for accuracy and completeness, and I agree with the above. Redell JUDITHANN Hans, M.D., Ph.D. 07/28/23 12:05 PM   Abbreviations: M myopia (nearsighted); A astigmatism; H hyperopia (farsighted); P presbyopia; Mrx spectacle  prescription;  CTL contact lenses; OD right eye; OS left eye; OU both eyes  XT exotropia; ET esotropia; PEK punctate epithelial keratitis; PEE punctate epithelial erosions; DES dry eye syndrome; MGD meibomian gland dysfunction; ATs artificial tears; PFAT's preservative free artificial tears; NSC nuclear sclerotic cataract; PSC posterior subcapsular cataract; ERM epi-retinal membrane; PVD posterior vitreous detachment; RD retinal detachment; DM diabetes mellitus; DR diabetic retinopathy; NPDR non-proliferative diabetic retinopathy; PDR proliferative diabetic retinopathy; CSME clinically significant macular edema; DME diabetic macular edema; dbh dot blot hemorrhages; CWS cotton wool spot; POAG primary open angle glaucoma; C/D cup-to-disc ratio; HVF humphrey visual field; GVF goldmann visual field; OCT optical coherence tomography; IOP intraocular pressure; BRVO Branch retinal vein occlusion; CRVO central retinal vein occlusion; CRAO central retinal artery occlusion; BRAO branch retinal artery occlusion; RT retinal tear; SB scleral buckle; PPV pars plana vitrectomy; VH Vitreous hemorrhage; PRP panretinal laser photocoagulation; IVK intravitreal kenalog; VMT vitreomacular traction; MH Macular hole;  NVD neovascularization of the disc; NVE neovascularization elsewhere; AREDS age related eye disease study; ARMD age related macular degeneration; POAG primary open angle glaucoma; EBMD epithelial/anterior basement membrane dystrophy; ACIOL anterior chamber intraocular lens; IOL intraocular lens; PCIOL posterior chamber intraocular lens; Phaco/IOL phacoemulsification with intraocular lens placement; PRK photorefractive keratectomy; LASIK laser assisted in situ keratomileusis; HTN hypertension; DM diabetes mellitus; COPD chronic obstructive pulmonary disease

## 2023-07-28 ENCOUNTER — Ambulatory Visit (INDEPENDENT_AMBULATORY_CARE_PROVIDER_SITE_OTHER): Admitting: Ophthalmology

## 2023-07-28 ENCOUNTER — Encounter (INDEPENDENT_AMBULATORY_CARE_PROVIDER_SITE_OTHER): Payer: Self-pay | Admitting: Ophthalmology

## 2023-07-28 DIAGNOSIS — H35033 Hypertensive retinopathy, bilateral: Secondary | ICD-10-CM

## 2023-07-28 DIAGNOSIS — H532 Diplopia: Secondary | ICD-10-CM

## 2023-07-28 DIAGNOSIS — I10 Essential (primary) hypertension: Secondary | ICD-10-CM

## 2023-07-28 DIAGNOSIS — E113413 Type 2 diabetes mellitus with severe nonproliferative diabetic retinopathy with macular edema, bilateral: Secondary | ICD-10-CM

## 2023-07-28 DIAGNOSIS — Z961 Presence of intraocular lens: Secondary | ICD-10-CM

## 2023-07-28 DIAGNOSIS — Z794 Long term (current) use of insulin: Secondary | ICD-10-CM

## 2023-07-28 DIAGNOSIS — Z7984 Long term (current) use of oral hypoglycemic drugs: Secondary | ICD-10-CM

## 2023-07-28 DIAGNOSIS — D3132 Benign neoplasm of left choroid: Secondary | ICD-10-CM | POA: Diagnosis not present

## 2023-07-28 DIAGNOSIS — H40051 Ocular hypertension, right eye: Secondary | ICD-10-CM

## 2023-07-28 MED ORDER — FARICIMAB-SVOA 6 MG/0.05ML IZ SOSY
6.0000 mg | PREFILLED_SYRINGE | INTRAVITREAL | Status: AC | PRN
  Administered 2023-07-28: 6 mg via INTRAVITREAL

## 2023-07-29 DIAGNOSIS — M25512 Pain in left shoulder: Secondary | ICD-10-CM | POA: Diagnosis not present

## 2023-07-29 DIAGNOSIS — M25612 Stiffness of left shoulder, not elsewhere classified: Secondary | ICD-10-CM | POA: Diagnosis not present

## 2023-08-18 DIAGNOSIS — Z4789 Encounter for other orthopedic aftercare: Secondary | ICD-10-CM | POA: Diagnosis not present

## 2023-08-26 DIAGNOSIS — E113413 Type 2 diabetes mellitus with severe nonproliferative diabetic retinopathy with macular edema, bilateral: Secondary | ICD-10-CM | POA: Diagnosis not present

## 2023-08-26 DIAGNOSIS — J449 Chronic obstructive pulmonary disease, unspecified: Secondary | ICD-10-CM | POA: Diagnosis not present

## 2023-08-26 DIAGNOSIS — I10 Essential (primary) hypertension: Secondary | ICD-10-CM | POA: Diagnosis not present

## 2023-08-26 DIAGNOSIS — E785 Hyperlipidemia, unspecified: Secondary | ICD-10-CM | POA: Diagnosis not present

## 2023-08-29 NOTE — Progress Notes (Signed)
 [facility] - Clinic Note  09/09/2023     CHIEF COMPLAINT Patient presents for Retina Follow Up   HISTORY OF PRESENT ILLNESS: Juan [name] is a 63 y.o. male who presents to the clinic today for:   HPI     Retina Follow Up   Patient presents with  Diabetic Retinopathy.  In both eyes.  This started 6 weeks ago.  Duration of 6 weeks.  Since onset it is stable.  I, the attending physician,  performed the HPI with the patient and updated documentation appropriately.        Comments   6 week retina follow up NPDR and IVV OU pt is reporting no vision changes noticed he denies any flashes some floaters last reading 123 A1C 6.3      Last edited by Valdemar Rogue, MD on 09/09/2023  8:08 AM.    Pt states VA seems a bit difficult this morning, mostly right eye. Cannot tell much difference otherwise.     Referring physician: Teresa Channel, MD [phone] [address] [place],  [place] [address]  HISTORICAL INFORMATION:   Selected notes from the MEDICAL RECORD NUMBER Referred by Dr. Oneil Platts for concern of CME s/p cataract sx   CURRENT MEDICATIONS: Current Outpatient Medications (Ophthalmic Drugs)  Medication Sig   dorzolamide -timolol  (COSOPT ) 22.3-6.8 MG/ML ophthalmic solution Place 1 drop into the right eye 2 (two) times daily. (Patient not taking: Reported on 09/09/2023)   No current facility-administered medications for this visit. (Ophthalmic Drugs)   Current Outpatient Medications (Other)  Medication Sig   ADVAIR DISKUS 500-50 MCG/DOSE AEPB Inhale 1 puff into the lungs 2 (two) times daily.   albuterol  (VENTOLIN  HFA) 108 (90 Base) MCG/ACT inhaler Inhale 1-2 puffs into the lungs every 6 (six) hours as needed for wheezing or shortness of breath.   alfuzosin (UROXATRAL) 10 MG 24 hr tablet TAKE 1 TABLET BY MOUTH EVERYDAY AT BEDTIME   aspirin  EC 81 MG tablet Take 81 mg by mouth daily.   atorvastatin  (LIPITOR) 40 MG tablet Take 1 tablet (40 mg total) by  mouth daily.   carvedilol  (COREG ) 12.5 MG tablet Take 1 tablet (12.5 mg total) by mouth 2 (two) times daily with a meal.   Empagliflozin-metFORMIN HCl (SYNJARDY) 12.06-998 MG TABS Take 1 tablet by mouth 2 (two) times a day.    esomeprazole (NEXIUM) 40 MG capsule Take 40 mg by mouth daily at 12 noon.   insulin  glargine, 2 Unit Dial, (TOUJEO MAX SOLOSTAR) 300 UNIT/ML Solostar Pen Inject 40 Units into the skin daily.    iron polysaccharides (NIFEREX) 150 MG capsule Take 150 mg by mouth daily.   isosorbide  mononitrate (IMDUR ) 30 MG 24 hr tablet Take 1 tablet (30 mg total) by mouth daily.   lisinopril  (ZESTRIL ) 40 MG tablet Take 1 tablet (40 mg total) by mouth daily.   nitroGLYCERIN (NITROSTAT) 0.4 MG SL tablet Place 0.4 mg under the tongue daily.   OZEMPIC, 0.25 OR 0.5 MG/DOSE, 2 MG/3ML SOPN Inject 0.25 mg into the skin once a week.   WIXELA INHUB 250-50 MCG/ACT AEPB Inhale 1 puff into the lungs 2 (two) times daily.   famotidine  (PEPCID ) 40 MG tablet Take by mouth. (Patient not taking: Reported on 09/09/2023)   No current facility-administered medications for this visit. (Other)   REVIEW OF SYSTEMS: ROS   Positive for: Gastrointestinal, Musculoskeletal, Endocrine, Eyes, Respiratory Negative for: Constitutional, Neurological, Skin, Genitourinary, HENT, Cardiovascular, Psychiatric, Allergic/Imm, Heme/Lymph Last edited by Resa Nest  W, COT on 09/09/2023  7:47 AM.       ALLERGIES Allergies  Allergen Reactions   Naproxen Shortness Of Breath   Naproxen Sodium Shortness Of Breath   Tiotropium Bromide Monohydrate  Shortness Of Breath   Penicillins Rash    Has patient had a PCN reaction causing immediate rash, facial/tongue/throat swelling, SOB or lightheadedness with hypotension: Yes Has patient had a PCN reaction causing severe rash involving mucus membranes or skin necrosis: No Has patient had a PCN reaction that required hospitalization No Has patient had a PCN reaction occurring within  the last 10 years: No If all of the above answers are NO, then may proceed with Cephalosporin use.  Has patient had a PCN reaction causing immediate rash, facial/tongue/throat swelling, SOB or lightheadedness with hypotension: Yes Has patient had a PCN reaction causing severe rash involving mucus membranes or skin necrosis: No Has patient had a PCN reaction that required hospitalization No Has patient had a PCN reaction occurring within the last 10 years: No If all of the above answers are NO, then may proceed with Cephalosporin use.    PAST MEDICAL HISTORY Past Medical History:  Diagnosis Date   Arthritis    Asthma    COPD (chronic obstructive pulmonary disease) (HCC)    Diabetes mellitus    Type 2   Diabetic retinopathy (HCC)    NPDR OU   GERD (gastroesophageal reflux disease)    H/O hiatal hernia    History of kidney stones    Hyperlipemia    Hypertension    Hypertensive retinopathy    OU   Nasal polyps    Pneumonia 2014   Shortness of breath    Sleep apnea    pt has CPAP but is unable to use it d/t having polyps in his nose. Pt stated I need to call and get a CPAP mask instead   Stomach ulcer    Past Surgical History:  Procedure Laterality Date   CATARACT EXTRACTION Right 07/05/2018   Dr. Roz   CATARACT EXTRACTION Left 07/12/2018   Dr. Roz   CHOLECYSTECTOMY N/A 01/23/2015   Procedure: LAPAROSCOPIC CHOLECYSTECTOMY ;  Surgeon: Dann Hummer, MD;  Location: [facility] OR;  Service: General;  Laterality: N/A;   COLONOSCOPY W/ POLYPECTOMY     ESOPHAGOGASTRODUODENOSCOPY     EYE SURGERY     KNEE ARTHROSCOPY  03/24/2011   Procedure: ARTHROSCOPY KNEE;  Surgeon: Norleen LITTIE Gavel, MD;  Location: [facility];  Service: Orthopedics;  Laterality: Right;  partial medial menisectomy, chondroplaty patella, and medial medial plica   LEFT HEART CATH AND CORONARY ANGIOGRAPHY N/A 06/29/2019   Procedure: LEFT HEART CATH AND CORONARY ANGIOGRAPHY;  Surgeon: Darron Deatrice LABOR, MD;  Location: [facility];  Service: Cardiovascular;  Laterality: N/A;   FAMILY HISTORY Family History  Problem Relation Age of Onset   Diabetes Father    Coronary artery disease Father    SOCIAL HISTORY Social History   Tobacco Use   Smoking status: Former    Current packs/day: 3.00    Average packs/day: 3.0 packs/day for 20.0 years (60.0 ttl pk-yrs)    Types: Cigarettes   Smokeless tobacco: Current    Types: Chew   Tobacco comments:    1 can/daily  Vaping Use   Vaping status: Never Used  Substance Use Topics   Alcohol use: Yes    Alcohol/week: 2.0 standard drinks of alcohol    Types: 2 Cans of beer per week    Comment: social,  1x weekly   Drug use: No       OPHTHALMIC EXAM:  Base Eye Exam     Visual Acuity (Snellen - Linear)       Right Left   Dist [place] 20/25 -2 20/70 -2   Dist ph [place] 20/20 -1 20/50 -2         Tonometry (Tonopen, 7:54 AM)       Right Left   Pressure 11 13         Pupils       Pupils Dark Light Shape React APD   Right PERRL 3 2 Round Brisk None   Left PERRL 3 2 Round Brisk None         Visual Fields       Left Right    Full Full         Extraocular Movement       Right Left    Full, Ortho Full, Ortho         Neuro/Psych     Oriented x3: Yes   Mood/Affect: Normal         Dilation     Both eyes: 2.5% Phenylephrine @ 7:54 AM           Slit Lamp and Fundus Exam     Slit Lamp Exam       Right Left   Lids/Lashes Dermatochalasis - upper lid, Telangiectasia, mild Meibomian gland dysfunction Dermatochalasis - upper lid, Telangiectasia, mild Meibomian gland dysfunction   Conjunctiva/Sclera White and quiet Mild nasal Pinguecula   Cornea Mild Arcus, Well healed temporal cataract wounds Arcus, Well healed temporal cataract wounds, trace PEE   Anterior Chamber Deep and clear Deep and clear   Iris Round and dilated, mild patch of atrophy at 0800, no NVI Round and dilated, No NVI   Lens PC IOL in good  position PC IOL in good position   Anterior Vitreous Vitreous syneresis Vitreous syneresis         Fundus Exam       Right Left   Disc trace pallor, Sharp rim +Pallor, Sharp rim, Compact, mild PPP temporal   C/D Ratio 0.3 0.3   Macula Good foveal reflex, scattered IRH/DBH -- greatest temporal macula, trace cystic changes and punctate exudates temporal mac -- slightly improved, mild focal laser changes Blunted foveal reflex, +central atrophy and pigment clumping, scattered DBH and Mas -- improved, scattered punctate exudates and cystic changes - greatest superior and temporal macula -- improved   Vessels attenuated, mild tortuosity attenuated, Tortuous   Periphery Attached, scattered DBH greatest posteriorly, prominent blot heme along SN arcades Attached, pigmented choroidal nevus superiorly with mild elevation, +drusen, no SRF or orange pigment -- unchanged from prior, scattered MA/DBH           IMAGING AND PROCEDURES  Imaging and Procedures for @TODAY @  OCT, Retina - OU - Both Eyes       Right Eye Quality was good. Central Foveal Thickness: 232. Progression has improved. Findings include normal foveal contour, no SRF, intraretinal hyper-reflective material, intraretinal fluid, outer retinal atrophy, vitreomacular adhesion (Interval improvement in IRF/IRHM ST macula ).   Left Eye Quality was good. Central Foveal Thickness: 200. Progression has improved. Findings include normal foveal contour, no SRF, intraretinal hyper-reflective material, intraretinal fluid, outer retinal atrophy (Interval improvement IRF/IRHM ST fovea and mac,: persistent central ORA; Sub-RPE hyper-reflective mass consistent with choroidal nevus -- superior to disc, caught on widefield -- stable from prior).   Notes *Images captured  and stored on drive  Diagnosis / Impression: +DME OU OD: Interval improvement in IRF/IRHM  ST macula  OS: Interval improvement in IRF/IRHM ST fovea and mac: persistent central ORA;  Sub-RPE hyper-reflective mass consistent with choroidal nevus -- superior to disc, caught on widefield -- stable from prior  Clinical management:  See below  Abbreviations: NFP - Normal foveal profile. CME - cystoid macular edema. PED - pigment epithelial detachment. IRF - intraretinal fluid. SRF - subretinal fluid. EZ - ellipsoid zone. ERM - epiretinal membrane. ORA - outer retinal atrophy. ORT - outer retinal tubulation. SRHM - subretinal hyper-reflective material      Intravitreal Injection, Pharmacologic Agent - OS - Left Eye       Time Out 09/09/2023. 8:17 AM. Confirmed correct patient, procedure, site, and patient consented.   Anesthesia Topical anesthesia was used. Anesthetic medications included Lidocaine  2%, Proparacaine 0.5%.   Procedure Preparation included 5% betadine  to ocular surface, eyelid speculum. A supplied (32g) needle was used.   Injection: 6 mg faricimab -svoa 6 MG/0.05ML   Route: Intravitreal, Site: Left Eye   NDC: 49757-903-93, Lot: [id number], Expiration date: 08/31/2024, Waste: 0 mL   Post-op Post injection exam found visual acuity of at least counting fingers. The patient tolerated the procedure well. There were no complications. The patient received written and verbal post procedure care education. Post injection medications were not given.            ASSESSMENT/PLAN:    ICD-10-CM   1. Severe nonproliferative diabetic retinopathy of both eyes with macular edema associated with type 2 diabetes mellitus (HCC)  E11.3413 OCT, Retina - OU - Both Eyes    Intravitreal Injection, Pharmacologic Agent - OS - Left Eye    faricimab -svoa (VABYSMO ) 6mg /0.60mL intravitreal injection    2. Current use of insulin  (HCC)  Z79.4     3. Long term (current) use of oral hypoglycemic drugs  Z79.84     4. 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 Essential hypertension  I10     6. Hypertensive retinopathy of both eyes  H35.033     7. Pseudophakia of both eyes  Z96.1      8. Ocular hypertension of right eye  H40.051     9. Diplopia  H53.2       1-3. Severe nonproliferative diabetic retinopathy with DME, OU (OS>OD)  - pt is delayed to follow up from 5 weeks to 10.5 weeks due to L shoulder/rotator cuff surgery (02.21.25 - 05.07.25)  - A1C 6.7 (08.01.256.3 (07.26.24) - s/p IVA OD #1 (06.26.20), #2 (08.14.20), #3 (09.17.20), #4 (10.16.20), #5 (02.02.22)  - s/p IVA OS #1 (07.17.20), #2 (08.14.20), #3 (09.17.20)  **IVA RESISTANCE OU**  ====================  - s/p focal laser OD (07.18.23) - s/p IVE OD #1 (11.13.20), #2 (12.11.20), #3 (01.18.21), #4 (02.19.21), #5 (03.19.21), #6 (04.16.21), #7 (05.14.21), #8 (06.17.21), #9 (07.16.21), #10 (09.21.21), #11 (10.29.21), #12 (11.29.21), #13 (12.29.21), #14 (03.09.22), #15 (04.06.22), #16 (05.11.12), #17 (06.15.22), #18 (7.20.22), #19 (08.24.22), #20 (10.04.22), #21 (11.08.22), #22 (12.13.22), #23 (02.21.23), #24 (03.28.23), #25 (05.09.23), #26 (06.20.23), #27 (08.01.23)  ===================== - s/p IVE OS #1 (10.16.20) - sample, #2 (11.13.20), #3 (12.11.20), #4 (01.18.21), #5 (02.19.21), #6 (03.19.21), #7 (04.16.21), #8 (05.14.21), #9 (06.17.21), #10 (7.16.21), #11 (09.21.21), #12 (10.29.21), #13 (11.29.21), #14 (12.29.21), #15 (02.02.22, sample), #16 (03.09.22), #17 (04.06.22), #18 (05.11.2012), #19 (06.15.22), #20 (7.20.22), #21 (08.24.22), #22 (10.4.22), #23 (11.8.22), #24 (12.13.22), #25 (01.17.23), #26 (02.21.23), #27 (03.28.23). #28 (05.09.23), #29 (06.20.23), #30 (  08.01.23), #31 (09.12.23), #32 (10.24.23), #33 (12.05.23) -- IVE resistance ==================== - s/p IVV OD #1 (11.08.24), #2 (12.13.24), #3 (12.13.24) #4 (01.17.25), #5 (02.21.25), #6 (06.26.25) - s/p IVV OS #1 (01.16.24), #2 (02.20.24), #3 (03.26.24), #4 (04.30.24), #5 (06.04.24), #6 (07.09.24), #7 (08.14.24), #8 (10.03.24), #9 (11.08.24), #10 (12.13.24), #11 (12.13.24), #12(01.17.25), #13 (02.21.25), #14 (05.07.25), #15 (06.26.25) - FA (06.26.20)  shows Severe NPDR OU w/ Late leaking MA OU, Enlarged FAZ OU, No NV OU, No significant hyperfluorescence of disc  - BCVA OD stable at 20/20; OS stable at 20/50 - OCT shows OD: Interval improvement in IRF/IRHM  ST macula, OS: Interval improvement IRF/IRHM ST fovea and mac: persistent central ORA; Sub-RPE hyper-reflective mass consistent with choroidal nevus -- superior to disc, caught on widefield -- stable from prior at 6 weeks  - recommend IVV OS #16 today, 08.09.25 w/ f/u in 6 wks,  - recommend holding treatment in OD today--working plan is to treat OD q3-27months.  - pt in agreement  - RBA of procedure discussed, questions answered  - see procedure note  - Vabysmo  informed consent form signed and scanned, 11.08.24 (OU)  - Vabysmo  auth approved for 2025 -- Vabysmo  co-pay program  - insurance will only approve one med at a time -- currently approved for Vabysmo   - f/u 6 weeks, DFE, OCT, possible injection(s)  4. Choroidal Nevus OS  - located at 1200, mild elevation, +drusen, no SRF  - baseline optos pictures obtained, 06.26.20  - discussed possible referral to oncology at [facility] in [place]  5,6. Hypertensive retinopathy OU  - discussed importance of tight BP control  - monitor  7. Pseudophakia OU  - s/p CE/IOL OU (OD on 06.03.20 and OS 06.10.20 by Dr. Roz)  - beautiful surgeries w/ IOLs in excellent position  - macular edema limiting vision as above  - monitor  8. Ocular hypertension   - IOP 15,16  - cont Cosopt  qd OU  - monitor  9. Intermittent diplopia - pt reports intermittent episodes of diplopia with both vertical and horizontal components; no rotational - pt had appt with Dr. Lamar Gaudy on 11.9.23 -- noted some improvement with prism  but did not recommend getting the prism  at this time  - pt feels diplopia is improved -- episodes less frequent   Ophthalmic Meds Ordered this visit:  Meds ordered this encounter  Medications   faricimab -svoa (VABYSMO )  6mg /0.60mL intravitreal injection     Return in about 6 weeks (around 10/21/2023) for NPDR OU, DFE, OCT, Possible Injxn.  There are no Patient Instructions on file for this visit.  This document serves as a record of services personally performed by Redell JUDITHANN Hans, MD, PhD. It was created on their behalf by Avelina Pereyra, COA an ophthalmic technician. The creation of this record is the provider's dictation and/or activities during the visit.   Electronically signed by: Avelina GORMAN Pereyra, COT  09/13/23  8:54 PM   Redell JUDITHANN Hans, M.D., Ph.D. Diseases & Surgery of the Retina and Vitreous [facility]  I have reviewed the above documentation for accuracy and completeness, and I agree with the above. Redell JUDITHANN Hans, M.D., Ph.D. 09/13/23 9:04 PM   Abbreviations: M myopia (nearsighted); A astigmatism; H hyperopia (farsighted); P presbyopia; Mrx spectacle prescription;  CTL contact lenses; OD right eye; OS left eye; OU both eyes  XT exotropia; ET esotropia; PEK punctate epithelial keratitis; PEE punctate epithelial erosions; DES dry eye syndrome; MGD meibomian gland dysfunction; ATs artificial  tears; PFAT's preservative free artificial tears; NSC nuclear sclerotic cataract; PSC posterior subcapsular cataract; ERM epi-retinal membrane; PVD posterior vitreous detachment; RD retinal detachment; DM diabetes mellitus; DR diabetic retinopathy; NPDR non-proliferative diabetic retinopathy; PDR proliferative diabetic retinopathy; CSME clinically significant macular edema; DME diabetic macular edema; dbh dot blot hemorrhages; CWS cotton wool spot; POAG primary open angle glaucoma; C/D cup-to-disc ratio; HVF humphrey visual field; GVF goldmann visual field; OCT optical coherence tomography; IOP intraocular pressure; BRVO Branch retinal vein occlusion; CRVO central retinal vein occlusion; CRAO central retinal artery occlusion; BRAO branch retinal artery occlusion; RT retinal tear; SB scleral  buckle; PPV pars plana vitrectomy; VH Vitreous hemorrhage; PRP panretinal laser photocoagulation; IVK intravitreal kenalog; VMT vitreomacular traction; MH Macular hole;  NVD neovascularization of the disc; NVE neovascularization elsewhere; AREDS age related eye disease study; ARMD age related macular degeneration; POAG primary open angle glaucoma; EBMD epithelial/anterior basement membrane dystrophy; ACIOL anterior chamber intraocular lens; IOL intraocular lens; PCIOL posterior chamber intraocular lens; Phaco/IOL phacoemulsification with intraocular lens placement; PRK photorefractive keratectomy; LASIK laser assisted in situ keratomileusis; HTN hypertension; DM diabetes mellitus; COPD chronic obstructive pulmonary disease

## 2023-09-02 DIAGNOSIS — I152 Hypertension secondary to endocrine disorders: Secondary | ICD-10-CM | POA: Diagnosis not present

## 2023-09-02 DIAGNOSIS — E1129 Type 2 diabetes mellitus with other diabetic kidney complication: Secondary | ICD-10-CM | POA: Diagnosis not present

## 2023-09-02 DIAGNOSIS — E1159 Type 2 diabetes mellitus with other circulatory complications: Secondary | ICD-10-CM | POA: Diagnosis not present

## 2023-09-02 DIAGNOSIS — Z794 Long term (current) use of insulin: Secondary | ICD-10-CM | POA: Diagnosis not present

## 2023-09-02 DIAGNOSIS — E113413 Type 2 diabetes mellitus with severe nonproliferative diabetic retinopathy with macular edema, bilateral: Secondary | ICD-10-CM | POA: Diagnosis not present

## 2023-09-02 DIAGNOSIS — Z1331 Encounter for screening for depression: Secondary | ICD-10-CM | POA: Diagnosis not present

## 2023-09-02 DIAGNOSIS — E1140 Type 2 diabetes mellitus with diabetic neuropathy, unspecified: Secondary | ICD-10-CM | POA: Diagnosis not present

## 2023-09-02 DIAGNOSIS — E1169 Type 2 diabetes mellitus with other specified complication: Secondary | ICD-10-CM | POA: Diagnosis not present

## 2023-09-09 ENCOUNTER — Ambulatory Visit (INDEPENDENT_AMBULATORY_CARE_PROVIDER_SITE_OTHER): Admitting: Ophthalmology

## 2023-09-09 ENCOUNTER — Encounter (INDEPENDENT_AMBULATORY_CARE_PROVIDER_SITE_OTHER): Payer: Self-pay | Admitting: Ophthalmology

## 2023-09-09 DIAGNOSIS — Z794 Long term (current) use of insulin: Secondary | ICD-10-CM | POA: Diagnosis not present

## 2023-09-09 DIAGNOSIS — D3132 Benign neoplasm of left choroid: Secondary | ICD-10-CM | POA: Diagnosis not present

## 2023-09-09 DIAGNOSIS — Z961 Presence of intraocular lens: Secondary | ICD-10-CM

## 2023-09-09 DIAGNOSIS — H532 Diplopia: Secondary | ICD-10-CM

## 2023-09-09 DIAGNOSIS — E113413 Type 2 diabetes mellitus with severe nonproliferative diabetic retinopathy with macular edema, bilateral: Secondary | ICD-10-CM

## 2023-09-09 DIAGNOSIS — Z7984 Long term (current) use of oral hypoglycemic drugs: Secondary | ICD-10-CM

## 2023-09-09 DIAGNOSIS — H40051 Ocular hypertension, right eye: Secondary | ICD-10-CM

## 2023-09-09 DIAGNOSIS — H35033 Hypertensive retinopathy, bilateral: Secondary | ICD-10-CM

## 2023-09-09 DIAGNOSIS — I10 Essential (primary) hypertension: Secondary | ICD-10-CM

## 2023-09-09 MED ORDER — FARICIMAB-SVOA 6 MG/0.05ML IZ SOSY
6.0000 mg | PREFILLED_SYRINGE | INTRAVITREAL | Status: AC | PRN
Start: 2023-09-09 — End: 2023-09-09
  Administered 2023-09-09: 6 mg via INTRAVITREAL

## 2023-10-12 ENCOUNTER — Other Ambulatory Visit: Payer: Self-pay

## 2023-10-12 DIAGNOSIS — D696 Thrombocytopenia, unspecified: Secondary | ICD-10-CM

## 2023-10-14 DIAGNOSIS — K635 Polyp of colon: Secondary | ICD-10-CM | POA: Diagnosis not present

## 2023-10-14 DIAGNOSIS — D125 Benign neoplasm of sigmoid colon: Secondary | ICD-10-CM | POA: Diagnosis not present

## 2023-10-14 DIAGNOSIS — Z09 Encounter for follow-up examination after completed treatment for conditions other than malignant neoplasm: Secondary | ICD-10-CM | POA: Diagnosis not present

## 2023-10-14 DIAGNOSIS — D128 Benign neoplasm of rectum: Secondary | ICD-10-CM | POA: Diagnosis not present

## 2023-10-14 DIAGNOSIS — D490 Neoplasm of unspecified behavior of digestive system: Secondary | ICD-10-CM | POA: Diagnosis not present

## 2023-10-14 DIAGNOSIS — Z860100 Personal history of colon polyps, unspecified: Secondary | ICD-10-CM | POA: Diagnosis not present

## 2023-10-14 LAB — HM COLONOSCOPY

## 2023-10-17 NOTE — Progress Notes (Signed)
 [facility] - Clinic Note  10/21/2023     CHIEF COMPLAINT Patient presents for Retina Follow Up   HISTORY OF PRESENT ILLNESS: Juan [name] is a 63 y.o. male who presents to the clinic today for:   HPI     Retina Follow Up   Patient presents with  Diabetic Retinopathy.  In both eyes.  This started 6 weeks ago.  I, the attending physician,  performed the HPI with the patient and updated documentation appropriately.        Comments   Patient here for 6 week retina follow up for NPDR OU. Patient states vision a little blurry. No eye pain. Doctors checking on liver.      Last edited by Valdemar Rogue, MD on 10/21/2023  6:33 PM.     Patient states the physicians are checking his liver for palettes. He feels the eyes are watering a lot. He is not using tears but will be purchasing some today.   Referring physician: Teresa Channel, MD [phone] [address] [place],  [place] [address]  HISTORICAL INFORMATION:   Selected notes from the MEDICAL RECORD NUMBER Referred by Dr. Oneil Platts for concern of CME s/p cataract sx   CURRENT MEDICATIONS: Current Outpatient Medications (Ophthalmic Drugs)  Medication Sig   dorzolamide -timolol  (COSOPT ) 22.3-6.8 MG/ML ophthalmic solution Place 1 drop into the right eye 2 (two) times daily. (Patient not taking: Reported on 10/21/2023)   No current facility-administered medications for this visit. (Ophthalmic Drugs)   Current Outpatient Medications (Other)  Medication Sig   ADVAIR DISKUS 500-50 MCG/DOSE AEPB Inhale 1 puff into the lungs 2 (two) times daily.   albuterol  (VENTOLIN  HFA) 108 (90 Base) MCG/ACT inhaler Inhale 1-2 puffs into the lungs every 6 (six) hours as needed for wheezing or shortness of breath.   alfuzosin (UROXATRAL) 10 MG 24 hr tablet TAKE 1 TABLET BY MOUTH EVERYDAY AT BEDTIME   aspirin  EC 81 MG tablet Take 81 mg by mouth daily.   atorvastatin  (LIPITOR) 40 MG tablet Take 1 tablet (40 mg total) by  mouth daily.   carvedilol  (COREG ) 12.5 MG tablet Take 1 tablet (12.5 mg total) by mouth 2 (two) times daily with a meal.   Empagliflozin-metFORMIN HCl (SYNJARDY) 12.06-998 MG TABS Take 1 tablet by mouth 2 (two) times a day.    esomeprazole (NEXIUM) 40 MG capsule Take 40 mg by mouth daily at 12 noon.   insulin  glargine, 2 Unit Dial, (TOUJEO MAX SOLOSTAR) 300 UNIT/ML Solostar Pen Inject 40 Units into the skin daily.    iron polysaccharides (NIFEREX) 150 MG capsule Take 150 mg by mouth daily.   isosorbide  mononitrate (IMDUR ) 30 MG 24 hr tablet Take 1 tablet (30 mg total) by mouth daily.   lisinopril  (ZESTRIL ) 40 MG tablet Take 1 tablet (40 mg total) by mouth daily.   nitroGLYCERIN (NITROSTAT) 0.4 MG SL tablet Place 0.4 mg under the tongue daily.   OZEMPIC, 0.25 OR 0.5 MG/DOSE, 2 MG/3ML SOPN Inject 0.25 mg into the skin once a week.   WIXELA INHUB 250-50 MCG/ACT AEPB Inhale 1 puff into the lungs 2 (two) times daily.   famotidine  (PEPCID ) 40 MG tablet Take by mouth. (Patient not taking: Reported on 10/21/2023)   No current facility-administered medications for this visit. (Other)   REVIEW OF SYSTEMS: ROS   Positive for: Gastrointestinal, Musculoskeletal, Endocrine, Eyes, Respiratory Negative for: Constitutional, Neurological, Skin, Genitourinary, HENT, Cardiovascular, Psychiatric, Allergic/Imm, Heme/Lymph Last edited by Orval Asberry RAMAN, COA on  10/21/2023  8:08 AM.     ALLERGIES Allergies  Allergen Reactions   Naproxen Shortness Of Breath   Naproxen Sodium Shortness Of Breath   Tiotropium Bromide Monohydrate  Shortness Of Breath   Penicillins Rash    Has patient had a PCN reaction causing immediate rash, facial/tongue/throat swelling, SOB or lightheadedness with hypotension: Yes Has patient had a PCN reaction causing severe rash involving mucus membranes or skin necrosis: No Has patient had a PCN reaction that required hospitalization No Has patient had a PCN reaction occurring within the  last 10 years: No If all of the above answers are NO, then may proceed with Cephalosporin use.  Has patient had a PCN reaction causing immediate rash, facial/tongue/throat swelling, SOB or lightheadedness with hypotension: Yes Has patient had a PCN reaction causing severe rash involving mucus membranes or skin necrosis: No Has patient had a PCN reaction that required hospitalization No Has patient had a PCN reaction occurring within the last 10 years: No If all of the above answers are NO, then may proceed with Cephalosporin use.    PAST MEDICAL HISTORY Past Medical History:  Diagnosis Date   Arthritis    Asthma    COPD (chronic obstructive pulmonary disease) (HCC)    Diabetes mellitus    Type 2   Diabetic retinopathy (HCC)    NPDR OU   GERD (gastroesophageal reflux disease)    H/O hiatal hernia    History of kidney stones    Hyperlipemia    Hypertension    Hypertensive retinopathy    OU   Nasal polyps    Pneumonia 2014   Shortness of breath    Sleep apnea    pt has CPAP but is unable to use it d/t having polyps in his nose. Pt stated I need to call and get a CPAP mask instead   Stomach ulcer    Past Surgical History:  Procedure Laterality Date   CATARACT EXTRACTION Right 07/05/2018   Dr. Roz   CATARACT EXTRACTION Left 07/12/2018   Dr. Roz   CHOLECYSTECTOMY N/A 01/23/2015   Procedure: LAPAROSCOPIC CHOLECYSTECTOMY ;  Surgeon: Dann Hummer, MD;  Location: [facility] OR;  Service: General;  Laterality: N/A;   COLONOSCOPY W/ POLYPECTOMY     ESOPHAGOGASTRODUODENOSCOPY     EYE SURGERY     KNEE ARTHROSCOPY  03/24/2011   Procedure: ARTHROSCOPY KNEE;  Surgeon: Norleen LITTIE Gavel, MD;  Location: [facility];  Service: Orthopedics;  Laterality: Right;  partial medial menisectomy, chondroplaty patella, and medial medial plica   LEFT HEART CATH AND CORONARY ANGIOGRAPHY N/A 06/29/2019   Procedure: LEFT HEART CATH AND CORONARY ANGIOGRAPHY;  Surgeon: Darron Deatrice LABOR,  MD;  Location: [facility];  Service: Cardiovascular;  Laterality: N/A;   FAMILY HISTORY Family History  Problem Relation Age of Onset   Diabetes Father    Coronary artery disease Father    SOCIAL HISTORY Social History   Tobacco Use   Smoking status: Former    Current packs/day: 3.00    Average packs/day: 3.0 packs/day for 20.0 years (60.0 ttl pk-yrs)    Types: Cigarettes   Smokeless tobacco: Current    Types: Chew   Tobacco comments:    1 can/daily  Vaping Use   Vaping status: Never Used  Substance Use Topics   Alcohol use: Yes    Alcohol/week: 2.0 standard drinks of alcohol    Types: 2 Cans of beer per week    Comment: social, 1x weekly   Drug  use: No       OPHTHALMIC EXAM:  Base Eye Exam     Visual Acuity (Snellen - Linear)       Right Left   Dist [place] 20/25 -2 20/60 -1   Dist ph [place] 20/20 -2 20/50 -2         Tonometry (Tonopen, 8:06 AM)       Right Left   Pressure 13 16         Pupils       Dark Light Shape React APD   Right 3 2 Round Brisk None   Left 3 2 Round Brisk None         Visual Fields (Counting fingers)       Left Right    Full Full         Extraocular Movement       Right Left    Full, Ortho Full, Ortho         Neuro/Psych     Oriented x3: Yes   Mood/Affect: Normal         Dilation     Both eyes: 1.0% Mydriacyl, 2.5% Phenylephrine @ 8:06 AM           Slit Lamp and Fundus Exam     Slit Lamp Exam       Right Left   Lids/Lashes Dermatochalasis - upper lid, Telangiectasia, mild Meibomian gland dysfunction Dermatochalasis - upper lid, Telangiectasia, mild Meibomian gland dysfunction   Conjunctiva/Sclera White and quiet Mild nasal Pinguecula   Cornea Mild Arcus, Well healed temporal cataract wounds, Debris in tear film, trace Punctate epithelial erosions Arcus, Well healed temporal cataract wounds, trace PEE   Anterior Chamber Deep and clear Deep and clear   Iris Round and dilated, mild patch of  atrophy at 0800, no NVI Round and dilated, No NVI   Lens PC IOL in good position PC IOL in good position   Anterior Vitreous Vitreous syneresis Vitreous syneresis         Fundus Exam       Right Left   Disc trace pallor, Sharp rim +Pallor, Sharp rim, Compact, mild PPP temporal   C/D Ratio 0.3 0.3   Macula Good foveal reflex, scattered IRH/DBH -- greatest temporal macula, trace cystic changes and punctate exudates temporal mac -- slightly improved, mild focal laser changes Blunted foveal reflex, +central atrophy and pigment clumping, scattered DBH and Mas -- improved, scattered punctate exudates and cystic changes - greatest superior and temporal macula -- improved   Vessels attenuated, mild tortuosity attenuated, Tortuous   Periphery Attached, scattered DBH greatest posteriorly, prominent blot heme along SN arcades Attached, pigmented choroidal nevus superiorly with mild elevation, +drusen, no SRF or orange pigment -- unchanged from prior, scattered MA/DBH           IMAGING AND PROCEDURES  Imaging and Procedures for @TODAY @  OCT, Retina - OU - Both Eyes       Right Eye Quality was good. Central Foveal Thickness: 239. Progression has improved. Findings include normal foveal contour, no SRF, intraretinal hyper-reflective material, intraretinal fluid, outer retinal atrophy, vitreomacular adhesion (Interval improvement in IRF/IRHM ST macula ).   Left Eye Quality was good. Central Foveal Thickness: 192. Progression has improved. Findings include normal foveal contour, no SRF, intraretinal hyper-reflective material, intraretinal fluid, outer retinal atrophy (Mild Interval improvement IRF/IRHM ST fovea and mac, persistent central ORA; Sub-RPE hyper-reflective mass consistent with choroidal nevus -- superior to disc, caught on widefield -- stable from prior).  Notes *Images captured and stored on drive  Diagnosis / Impression: +DME OU OD: Interval improvement in IRF/IRHM  ST macula   OS: Mild Interval improvement in IRF/IRHM ST fovea and mac: persistent central ORA; Sub-RPE hyper-reflective mass consistent with choroidal nevus -- superior to disc, caught on widefield -- stable from prior  Clinical management:  See below  Abbreviations: NFP - Normal foveal profile. CME - cystoid macular edema. PED - pigment epithelial detachment. IRF - intraretinal fluid. SRF - subretinal fluid. EZ - ellipsoid zone. ERM - epiretinal membrane. ORA - outer retinal atrophy. ORT - outer retinal tubulation. SRHM - subretinal hyper-reflective material      Intravitreal Injection, Pharmacologic Agent - OS - Left Eye       Time Out 10/21/2023. 8:27 AM. Confirmed correct patient, procedure, site, and patient consented.   Anesthesia Topical anesthesia was used. Anesthetic medications included Lidocaine  2%, Proparacaine 0.5%.   Procedure Preparation included 5% betadine  to ocular surface, eyelid speculum. A supplied (32g) needle was used.   Injection: 6 mg faricimab -svoa 6 MG/0.05ML   Route: Intravitreal, Site: Left Eye   NDC: 49757-903-93, Lot: [id number], Expiration date: 10/31/2024, Waste: 0 mL   Post-op Post injection exam found visual acuity of at least counting fingers. The patient tolerated the procedure well. There were no complications. The patient received written and verbal post procedure care education. Post injection medications were not given.            ASSESSMENT/PLAN:    ICD-10-CM   1. Severe nonproliferative diabetic retinopathy of both eyes with macular edema associated with type 2 diabetes mellitus (HCC)  E11.3413 OCT, Retina - OU - Both Eyes    Intravitreal Injection, Pharmacologic Agent - OS - Left Eye    faricimab -svoa (VABYSMO ) 6mg /0.71mL intravitreal injection    2. Current use of insulin  (HCC)  Z79.4     3. Long term (current) use of oral hypoglycemic drugs  Z79.84     4. 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 Essential hypertension  I10     6.  Hypertensive retinopathy of both eyes  H35.033     7. Pseudophakia of both eyes  Z96.1     8. Ocular hypertension of right eye  H40.051     9. Diplopia  H53.2      1-3. Severe nonproliferative diabetic retinopathy with DME, OU (OS>OD) - pt is delayed to follow up from 5 weeks to 10.5 weeks due to L shoulder/rotator cuff surgery (02.21.25 - 05.07.25)  - A1C 6.7 (08.01.25), 6.3 (07.26.24) - s/p IVA OD #1 (06.26.20), #2 (08.14.20), #3 (09.17.20), #4 (10.16.20), #5 (02.02.22)  - s/p IVA OS #1 (07.17.20), #2 (08.14.20), #3 (09.17.20)  **IVA RESISTANCE OU**  ====================  - s/p focal laser OD (07.18.23) - s/p IVE OD #1 (11.13.20), #2 (12.11.20), #3 (01.18.21), #4 (02.19.21), #5 (03.19.21), #6 (04.16.21), #7 (05.14.21), #8 (06.17.21), #9 (07.16.21), #10 (09.21.21), #11 (10.29.21), #12 (11.29.21), #13 (12.29.21), #14 (03.09.22), #15 (04.06.22), #16 (05.11.12), #17 (06.15.22), #18 (7.20.22), #19 (08.24.22), #20 (10.04.22), #21 (11.08.22), #22 (12.13.22), #23 (02.21.23), #24 (03.28.23), #25 (05.09.23), #26 (06.20.23), #27 (08.01.23)  ===================== - s/p IVE OS #1 (10.16.20) - sample, #2 (11.13.20), #3 (12.11.20), #4 (01.18.21), #5 (02.19.21), #6 (03.19.21), #7 (04.16.21), #8 (05.14.21), #9 (06.17.21), #10 (7.16.21), #11 (09.21.21), #12 (10.29.21), #13 (11.29.21), #14 (12.29.21), #15 (02.02.22, sample), #16 (03.09.22), #17 (04.06.22), #18 (05.11.2012), #19 (06.15.22), #20 (7.20.22), #21 (08.24.22), #22 (10.4.22), #23 (11.8.22), #24 (12.13.22), #25 (01.17.23), #26 (02.21.23), #27 (03.28.23). #28 (05.09.23), #  29 (06.20.23), #30 (08.01.23), #31 (09.12.23), #32 (10.24.23), #33 (12.05.23) -- IVE resistance ==================== - s/p IVV OD #1 (11.08.24), #2 (12.13.24), #3 (12.13.24) #4 (01.17.25), #5 (02.21.25), #6 (06.26.25) - s/p IVV OS #1 (01.16.24), #2 (02.20.24), #3 (03.26.24), #4 (04.30.24), #5 (06.04.24), #6 (07.09.24), #7 (08.14.24), #8 (10.03.24), #9 (11.08.24), #10 (12.13.24), #11  (12.13.24), #12 (01.17.25), #13 (02.21.25), #14 (05.07.25), #15 (06.26.25), #16 (08.09.25) - FA (06.26.20) shows Severe NPDR OU w/ Late leaking MA OU, Enlarged FAZ OU, No NV OU, No significant hyperfluorescence of disc  - BCVA OD stable at 20/20; OS stable at 20/50 - OCT shows OD: Interval improvement in IRF/IRHM  ST macula, OS: Interval improvement IRF/IRHM ST fovea and mac: persistent central ORA; Sub-RPE hyper-reflective mass consistent with choroidal nevus -- superior to disc, caught on widefield -- stable from prior at 6 weeks  - recommend IVV OS #17 today, 09.19.25 w/ f/u in 6 wks,  - recommend holding treatment in OD today--working plan is to treat OD q3-28months vs PRN - pt in agreement  - RBA of procedure discussed, questions answered  - see procedure note - Vabysmo  informed consent form signed and scanned, 11.08.24 (OU)  - Vabysmo  auth approved for 2025 -- Vabysmo  co-pay program - insurance will only approve one med at a time -- currently approved for Vabysmo   - f/u 6 weeks, DFE, OCT, possible injection(s)  4. Choroidal Nevus OS  - located at 1200, mild elevation, +drusen, no SRF  - baseline optos pictures obtained, 06.26.20 - discussed possible referral to oncology at [facility] in [place]  5,6. Hypertensive retinopathy OU  - discussed importance of tight BP control  - monitor  7. Pseudophakia OU  - s/p CE/IOL OU (OD on 06.03.20 and OS 06.10.20 by Dr. Roz)  - beautiful surgeries w/ IOLs in excellent position  - macular edema limiting vision as above  - monitor  8. Ocular hypertension   - IOP 15,16  - cont Cosopt  qd OU  - monitor  9. Intermittent diplopia - pt reports intermittent episodes of diplopia with both vertical and horizontal components; no rotational - pt had appt with Dr. Lamar Gaudy on 11.9.23 -- noted some improvement with prism  but did not recommend getting the prism  at this time  - pt feels diplopia is improved -- episodes less  frequent   Ophthalmic Meds Ordered this visit:  Meds ordered this encounter  Medications   faricimab -svoa (VABYSMO ) 6mg /0.90mL intravitreal injection     Return in about 6 weeks (around 12/02/2023) for f/u, NPDR, DFE, OCT, Possible, IVV, OS, Vs., OU.  There are no Patient Instructions on file for this visit.  This document serves as a record of services personally performed by Redell JUDITHANN Hans, MD, PhD. It was created on their behalf by Delon Newness COT, an ophthalmic technician. The creation of this record is the provider's dictation and/or activities during the visit.    Electronically signed by: Delon Newness COT 09.15.25 6:37 PM,  This document serves as a record of services personally performed by Redell JUDITHANN Hans, MD, PhD. It was created on their behalf by Wanda GEANNIE Keens, COT an ophthalmic technician. The creation of this record is the provider's dictation and/or activities during the visit.    Electronically signed by:  Wanda GEANNIE Keens, COT  10/21/23 6:37 PM  Redell JUDITHANN Hans, M.D., Ph.D. Diseases & Surgery of the Retina and Vitreous [facility] 10/21/2023   I have reviewed the above documentation for accuracy and completeness,  and I agree with the above. Redell JUDITHANN Hans, M.D., Ph.D. 10/21/23 6:40 PM   Abbreviations: M myopia (nearsighted); A astigmatism; H hyperopia (farsighted); P presbyopia; Mrx spectacle prescription;  CTL contact lenses; OD right eye; OS left eye; OU both eyes  XT exotropia; ET esotropia; PEK punctate epithelial keratitis; PEE punctate epithelial erosions; DES dry eye syndrome; MGD meibomian gland dysfunction; ATs artificial tears; PFAT's preservative free artificial tears; NSC nuclear sclerotic cataract; PSC posterior subcapsular cataract; ERM epi-retinal membrane; PVD posterior vitreous detachment; RD retinal detachment; DM diabetes mellitus; DR diabetic retinopathy; NPDR non-proliferative diabetic retinopathy; PDR  proliferative diabetic retinopathy; CSME clinically significant macular edema; DME diabetic macular edema; dbh dot blot hemorrhages; CWS cotton wool spot; POAG primary open angle glaucoma; C/D cup-to-disc ratio; HVF humphrey visual field; GVF goldmann visual field; OCT optical coherence tomography; IOP intraocular pressure; BRVO Branch retinal vein occlusion; CRVO central retinal vein occlusion; CRAO central retinal artery occlusion; BRAO branch retinal artery occlusion; RT retinal tear; SB scleral buckle; PPV pars plana vitrectomy; VH Vitreous hemorrhage; PRP panretinal laser photocoagulation; IVK intravitreal kenalog; VMT vitreomacular traction; MH Macular hole;  NVD neovascularization of the disc; NVE neovascularization elsewhere; AREDS age related eye disease study; ARMD age related macular degeneration; POAG primary open angle glaucoma; EBMD epithelial/anterior basement membrane dystrophy; ACIOL anterior chamber intraocular lens; IOL intraocular lens; PCIOL posterior chamber intraocular lens; Phaco/IOL phacoemulsification with intraocular lens placement; PRK photorefractive keratectomy; LASIK laser assisted in situ keratomileusis; HTN hypertension; DM diabetes mellitus; COPD chronic obstructive pulmonary disease

## 2023-10-19 ENCOUNTER — Ambulatory Visit
Admission: RE | Admit: 2023-10-19 | Discharge: 2023-10-19 | Disposition: A | Discharge status: Home / Self Care | Source: Ambulatory Visit

## 2023-10-19 DIAGNOSIS — D696 Thrombocytopenia, unspecified: Secondary | ICD-10-CM

## 2023-10-19 NOTE — Progress Notes (Signed)
 Cardiology Office Note    Date:  10/21/2023  ID:  ANNE SEBRING, DOB 12-Apr-1960, MRN [id number] PCP:  Cristopher Bottcher, NP  Cardiologist:  Soyla DELENA Merck, MD  Electrophysiologist:  None   Chief Complaint: Acute visit for irregular heart beat   History of Present Illness: Juan    YANN [name] is a 63 y.o. male with visit-pertinent history of diabetes mellitus, hypertension, hyperlipidemia, COPD/chronic bronchitis, OSA on CPAP.  Patient was first seen by Dr. Merck in 05/2019 for evaluation of coronary artery calcifications noted on chest CT.  Patient underwent MPI on 06/19/2019 that indicated LVEF mildly decreased at 45 to 54%, no ST segment deviation noted during stress, there was a small defect of mild severity present in the apical inferior location consistent with ischemia.  Patient underwent LHC on 06/29/2019 which indicated distal RCA lesion 50% stenosis, RPDA lesion 60% stenosed, distal CX lesion 85% stenosed, proximal LAD to mid LAD lesion 30% stenosed, first diagonal lesion 30% stenosed.  Patient noted to have significant one-vessel coronary artery disease affecting the distal left circumflex supplying OM 3, medication management was recommended.  In January 2023 patient presented the ED with complaints of chest pain, bilateral lower extremity edema, shortness of breath.  EKG was unremarkable, repeat echo showed EF 55 to 60%, no LVH, no RWMA, normal diastolic function, mild TR.  Patient reported occasional fleeting palpitations patient's EKG indicated frequent PVCs.  Patient was last seen in clinic on 04/08/2023 by Josefa Beauvais, NP, patient noted occasional palpitations that he reported had remained stable.  Today patient presents regarding an irregular heartbeat.  Patient reports that he recently had a colonoscopy and was told by one of the nurses in office that his EKG was irregular.  Patient denied any symptoms of palpitations, shortness of breath, presyncope or syncope.  Patient  reports that he has actually been doing very well overall.  He denies any chest pain, orthopnea or PND.  Patient notes that he has previously been told that he has a history of PVCs, reviewed patient that EKG today does show evidence of frequent PVCs.  Patient denies any palpitations or feeling of irregular heartbeats or increased heart rates.  He notes some very mild lower extremity edema. ROS: .   Today he denies chest pain, shortness of breath, lower extremity edema, fatigue, palpitations, melena, hematuria, hemoptysis, diaphoresis, weakness, presyncope, syncope, orthopnea, and PND.  All other systems are reviewed and otherwise negative. Studies Reviewed: Juan   EKG:  EKG is ordered today, personally reviewed, demonstrating  EKG Interpretation Date/Time:  Friday October 21 2023 13:06:55 EDT Ventricular Rate:  82 PR Interval:  152 QRS Duration:  88 QT Interval:  356 QTC Calculation: 415 R Axis:   86  Text Interpretation: Sinus rhythm with frequent Premature ventricular complexes Low voltage QRS Confirmed by Juan [name] [phone]) on 10/21/2023 9:21:26 PM   CV Studies: Cardiac studies reviewed are outlined and summarized above. Otherwise please see EMR for full report. Cardiac Studies & Procedures   ______________________________________________________________________________________________ CARDIAC CATHETERIZATION  CARDIAC CATHETERIZATION 06/29/2019  Conclusion  The left ventricular systolic function is normal.  LV end diastolic pressure is mildly elevated.  The left ventricular ejection fraction is 55-65% by visual estimate.  Dist RCA lesion is 50% stenosed.  RPDA lesion is 60% stenosed.  Dist Cx lesion is 85% stenosed.  Prox LAD to Mid LAD lesion is 30% stenosed.  1st Diag lesion is 30% stenosed.  1.  Right dominant coronary arteries.  Moderately to severely  calcified coronary arteries with significant one-vessel coronary artery disease affecting the distal left circumflex  supplying OM 3.  There is moderate stenosis in the distal right coronary artery into the right PDA. 2.  Normal LV systolic function mildly elevated left ventricular end-diastolic pressure.  Recommendations: The patient had apical ischemia on stress testing which does not correlate with the stenosis in the left circumflex.  The patient does require aggressive treatment of his risk factors considering the degree of atherosclerosis and calcifications. Recommend starting antianginal therapy.  I added carvedilol  3.125 mg twice daily and this can be uptitrated as tolerated. If the patient has residual angina in spite of antianginal therapy, left circumflex PCI can be performed.  Findings Coronary Findings Diagnostic  Dominance: Right  Left Main The vessel exhibits minimal luminal irregularities.  Left Anterior Descending Prox LAD to Mid LAD lesion is 30% stenosed. The lesion is moderately calcified.  First Diagonal Branch 1st Diag lesion is 30% stenosed.  Second Diagonal Branch The vessel exhibits minimal luminal irregularities.  Third Diagonal Branch Vessel is small in size.  Left Circumflex Dist Cx lesion is 85% stenosed. The lesion is mildly calcified.  First Obtuse Marginal Branch The vessel exhibits minimal luminal irregularities.  Second Obtuse Marginal Branch Vessel is angiographically normal.  Right Coronary Artery Dist RCA lesion is 50% stenosed. The lesion is severely calcified.  Right Posterior Descending Artery RPDA lesion is 60% stenosed. The lesion is moderately calcified.  Intervention  No interventions have been documented.   STRESS TESTS  MYOCARDIAL PERFUSION IMAGING 06/19/2019  Interpretation Summary  Nuclear stress EF: 54%.  The left ventricular ejection fraction is mildly decreased (45-54%).  Blood pressure demonstrated a normal response to exercise.  There was no ST segment deviation noted during stress.  Defect 1: There is a small defect of  mild severity present in the apical inferior location.  Findings consistent with ischemia.  This is a low risk study.  There is a small, mild, reversible perfusion defect at the inferoapex. Returns to normal at rest. No ischemia noted on stress ECG.  Findings suggest a small area of ischemia at the apex on perfusion imaging. Mildly reduced exercise capacity. Normal HR and BP response to exercise. Test stopped due to angina. Normal wall motion on perfusion imaging.  Overall low risk study with ischemia at the apex.            ______________________________________________________________________________________________       Current Reported Medications:.    Current Meds  Medication Sig   ADVAIR DISKUS 500-50 MCG/DOSE AEPB Inhale 1 puff into the lungs 2 (two) times daily.   albuterol  (VENTOLIN  HFA) 108 (90 Base) MCG/ACT inhaler Inhale 1-2 puffs into the lungs every 6 (six) hours as needed for wheezing or shortness of breath.   alfuzosin (UROXATRAL) 10 MG 24 hr tablet TAKE 1 TABLET BY MOUTH EVERYDAY AT BEDTIME   aspirin  EC 81 MG tablet Take 81 mg by mouth daily.   atorvastatin  (LIPITOR) 40 MG tablet Take 1 tablet (40 mg total) by mouth daily.   carvedilol  (COREG ) 12.5 MG tablet Take 1 tablet (12.5 mg total) by mouth 2 (two) times daily with a meal.   Empagliflozin-metFORMIN HCl (SYNJARDY) 12.06-998 MG TABS Take 1 tablet by mouth 2 (two) times a day.    esomeprazole (NEXIUM) 40 MG capsule Take 40 mg by mouth daily at 12 noon.   insulin  glargine, 2 Unit Dial, (TOUJEO MAX SOLOSTAR) 300 UNIT/ML Solostar Pen Inject 40 Units into the skin daily.  iron polysaccharides (NIFEREX) 150 MG capsule Take 150 mg by mouth daily.   isosorbide  mononitrate (IMDUR ) 30 MG 24 hr tablet Take 1 tablet (30 mg total) by mouth daily.   lisinopril  (ZESTRIL ) 40 MG tablet Take 1 tablet (40 mg total) by mouth daily.   nitroGLYCERIN (NITROSTAT) 0.4 MG SL tablet Place 0.4 mg under the tongue daily.   OZEMPIC,  0.25 OR 0.5 MG/DOSE, 2 MG/3ML SOPN Inject 0.25 mg into the skin once a week.   WIXELA INHUB 250-50 MCG/ACT AEPB Inhale 1 puff into the lungs 2 (two) times daily.   Physical Exam:    VS:  BP 122/74   Pulse 82   Ht 5' 9 (1.753 m)   Wt 228 lb 12.8 oz (103.8 kg)   SpO2 95%   BMI 33.79 kg/m    Wt Readings from Last 3 Encounters:  10/21/23 228 lb 12.8 oz (103.8 kg)  04/08/23 239 lb 6.4 oz (108.6 kg)  04/02/22 234 lb 6.4 oz (106.3 kg)    GEN: Well nourished, well developed in no acute distress NECK: No JVD; No carotid bruits CARDIAC: RRR, no murmurs, rubs, gallops RESPIRATORY:  Clear to auscultation without rales, wheezing or rhonchi  ABDOMEN: Soft, non-tender, non-distended EXTREMITIES:  No edema; No acute deformity     Asessement and Plan:.    CAD: Cath in May 2021 showed moderately to severe calcified coronary arteries with significant one-vessel CAD affecting the distal left circumflex supplying OM 3, moderate stenosis in the distal RCA into the right PDA, normal LVEF with mildly elevated LVEDP.  Mild comanagement was recommended. Today he reports that he is doing well.  He denies any chest pain or shortness of breath. Heart healthy diet and regular cardiovascular exercise encouraged.  Continue aspirin  81 mg daily, Lipitor 40 mg daily, carvedilol  12.5 mg twice daily.   PVCs: Patient with history of frequent PVCs, per patient he recently had a colonoscopy and was told that his rhythm was irregular.  Patient denies any symptoms of increased palpitations or feelings of irregular heartbeats.  Discussed cardiac monitor to evaluate PVC burden, patient deferred at this time, he is agreeable to echocardiogram as he does note some mild lower extremity edema, patient notes that if his EF is slightly reduced he is agreeable to wearing a monitor to assess for his PVC burden.  Patient agreed to notify the office if he becomes symptomatic with PVCs.  Reviewed ED precautions. Check CBC, BMET, Mag and TSH.   Continue carvedilol  12.5 mg twice daily.  Hypertension: Blood pressure today 122/74.  Continue current antihypertensive regimen.  Type 2 DM: Last hemoglobin A1c 7.5% on 02/25/2023.  Monitored and managed per PCP.  OSA: Patient reports that he has not been using a CPAP device, is planning to follow-up with a sleep clinic regarding his options.   Disposition: F/u with Dr. Loni in 3-4 months or sooner if needed.    Signed, Yicel Shannon D Avary Pitsenbarger, NP

## 2023-10-21 ENCOUNTER — Encounter (INDEPENDENT_AMBULATORY_CARE_PROVIDER_SITE_OTHER): Payer: Self-pay | Admitting: Ophthalmology

## 2023-10-21 ENCOUNTER — Ambulatory Visit: Attending: Cardiology | Admitting: Cardiology

## 2023-10-21 ENCOUNTER — Ambulatory Visit (INDEPENDENT_AMBULATORY_CARE_PROVIDER_SITE_OTHER): Admitting: Ophthalmology

## 2023-10-21 ENCOUNTER — Encounter: Payer: Self-pay | Admitting: Cardiology

## 2023-10-21 VITALS — BP 122/74 | HR 82 | Ht 69.0 in | Wt 228.8 lb

## 2023-10-21 DIAGNOSIS — H35033 Hypertensive retinopathy, bilateral: Secondary | ICD-10-CM

## 2023-10-21 DIAGNOSIS — I493 Ventricular premature depolarization: Secondary | ICD-10-CM | POA: Diagnosis not present

## 2023-10-21 DIAGNOSIS — E113413 Type 2 diabetes mellitus with severe nonproliferative diabetic retinopathy with macular edema, bilateral: Secondary | ICD-10-CM | POA: Diagnosis not present

## 2023-10-21 DIAGNOSIS — E785 Hyperlipidemia, unspecified: Secondary | ICD-10-CM

## 2023-10-21 DIAGNOSIS — Z794 Long term (current) use of insulin: Secondary | ICD-10-CM | POA: Diagnosis not present

## 2023-10-21 DIAGNOSIS — I2510 Atherosclerotic heart disease of native coronary artery without angina pectoris: Secondary | ICD-10-CM

## 2023-10-21 DIAGNOSIS — D3132 Benign neoplasm of left choroid: Secondary | ICD-10-CM

## 2023-10-21 DIAGNOSIS — H40051 Ocular hypertension, right eye: Secondary | ICD-10-CM

## 2023-10-21 DIAGNOSIS — Z961 Presence of intraocular lens: Secondary | ICD-10-CM

## 2023-10-21 DIAGNOSIS — G4733 Obstructive sleep apnea (adult) (pediatric): Secondary | ICD-10-CM

## 2023-10-21 DIAGNOSIS — E118 Type 2 diabetes mellitus with unspecified complications: Secondary | ICD-10-CM

## 2023-10-21 DIAGNOSIS — Z7984 Long term (current) use of oral hypoglycemic drugs: Secondary | ICD-10-CM

## 2023-10-21 DIAGNOSIS — I10 Essential (primary) hypertension: Secondary | ICD-10-CM | POA: Diagnosis not present

## 2023-10-21 DIAGNOSIS — H532 Diplopia: Secondary | ICD-10-CM

## 2023-10-21 MED ORDER — FARICIMAB-SVOA 6 MG/0.05ML IZ SOSY
6.0000 mg | PREFILLED_SYRINGE | INTRAVITREAL | Status: AC | PRN
  Administered 2023-10-21: 6 mg via INTRAVITREAL

## 2023-10-21 NOTE — Patient Instructions (Signed)
 Medication Instructions:   Your physician recommends that you continue on your current medications as directed. Please refer to the Current Medication list given to you today.  *If you need a refill on your cardiac medications before your next appointment, please call your pharmacy*  Lab Work:  PLEASE GO DOWN STAIRS  [facility]  FIRST FLOOR   ( GET OFF ELEVATORS WALK TOWARDS WAITING AREA LAB LOCATED BY PHARMACY): BMET MAG AND TSH TODAY      If you have labs (blood work) drawn today and your tests are completely normal, you will receive your results only by: MyChart Message (if you have MyChart) OR A paper copy in the mail If you have any lab test that is abnormal or we need to change your treatment, we will call you to review the results.  Testing/Procedures: Your physician has requested that you have an echocardiogram. Echocardiography is a painless test that uses sound waves to create images of your heart. It provides your doctor with information about the size and shape of your heart and how well your heart's chambers and valves are working. This procedure takes approximately one hour. There are no restrictions for this procedure. Please do NOT wear cologne, perfume, aftershave, or lotions (deodorant is allowed). Please arrive 15 minutes prior to your appointment time.  Please note: We ask at that you not bring children with you during ultrasound (echo/ vascular) testing. Due to room size and safety concerns, children are not allowed in the ultrasound rooms during exams. Our front office staff cannot provide observation of children in our lobby area while testing is being conducted. An adult accompanying a patient to their appointment will only be allowed in the ultrasound room at the discretion of the ultrasound technician under special circumstances. We apologize for any inconvenience.    Follow-Up: At [facility], you and your health needs are our priority.  As part of our  continuing mission to provide you with exceptional heart care, our providers are all part of one team.  This team includes your primary Cardiologist (physician) and Advanced Practice Providers or APPs (Physician Assistants and Nurse Practitioners) who all work together to provide you with the care you need, when you need it.  Your next appointment:  3-4  month(s)   Provider:  Soyla DELENA Merck, MD    We recommend signing up for the patient portal called MyChart.  Sign up information is provided on this After Visit Summary.  MyChart is used to connect with patients for Virtual Visits (Telemedicine).  Patients are able to view lab/test results, encounter notes, upcoming appointments, etc.  Non-urgent messages can be sent to your provider as well.   To learn more about what you can do with MyChart, go to [email or web address].   Other Instructions

## 2023-10-22 ENCOUNTER — Encounter: Payer: Self-pay | Admitting: Cardiology

## 2023-10-22 LAB — MAGNESIUM: Magnesium: 1.7 mg/dL (ref 1.6–2.3)

## 2023-10-22 LAB — TSH: TSH: 1.11 u[IU]/mL (ref 0.450–4.500)

## 2023-10-22 LAB — BASIC METABOLIC PANEL WITH GFR
BUN/Creatinine Ratio: 17 (ref 10–24)
BUN: 18 mg/dL (ref 8–27)
CO2: 23 mmol/L (ref 20–29)
Calcium: 10 mg/dL (ref 8.6–10.2)
Chloride: 100 mmol/L (ref 96–106)
Creatinine, Ser: 1.03 mg/dL (ref 0.76–1.27)
Glucose: 122 mg/dL — ABNORMAL HIGH (ref 70–99)
Potassium: 4.5 mmol/L (ref 3.5–5.2)
Sodium: 139 mmol/L (ref 134–144)
eGFR: 82 mL/min/1.73 (ref >59)

## 2023-10-24 ENCOUNTER — Telehealth: Payer: Self-pay | Admitting: Gastroenterology

## 2023-10-24 ENCOUNTER — Ambulatory Visit: Payer: Self-pay | Admitting: Cardiology

## 2023-10-24 NOTE — Telephone Encounter (Signed)
 I have reviewed the patient's chart that was left in my office.  9/25 clinic visit by [facility] gastroenterology for evaluation of history of polyps and GERD and vomiting and dark stools with a hemoglobin/hematocrit of 14.3/41.3 and platelets of 118.  September 2025 colonoscopy Five 3 to 12 mm polyps in the ascending/hepatic/descending/sigmoid/rectum removed with hot snare. A 20 mm polypoid lesion found in the cecum.  No bleeding present.  Biopsied. Internal hemorrhoids.  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   I am happy to evaluate this patient for consideration of advanced endoscopic resection of the cecal adenoma. We are scheduling into December at this time.  Patty, Please move forward with scheduling patient colonoscopy with EMR 90 minutes slot. Please move forward with scheduling clinic visit to discuss resection attempted. Please obtain the color pictures of colonoscopy for the clinic visit. Thanks. GM

## 2023-10-24 NOTE — Telephone Encounter (Signed)
 Dr. Wilhelmenia,  Please see referral from Dr. Dianna. Patient is being referred for EMR of cecal tubalar adenoma.   Records placed on your desk for review.  Thank you, Corean

## 2023-10-24 NOTE — Telephone Encounter (Signed)
 Spoke with pt regarding labs. Pt had no questions or concerns. Pt was advised to continue with echocardiogram as planned. Pt agreed.

## 2023-10-25 ENCOUNTER — Other Ambulatory Visit: Payer: Self-pay

## 2023-10-25 DIAGNOSIS — K635 Polyp of colon: Secondary | ICD-10-CM

## 2023-10-25 MED ORDER — NA SULFATE-K SULFATE-MG SULF 17.5-3.13-1.6 GM/177ML PO SOLN: 1.0000 | Freq: Once | ORAL | 0 refills | Status: AC

## 2023-10-25 NOTE — Telephone Encounter (Signed)
 Colon EMR has been set up for 12/01/23 at [facility] with GM at 215 pm

## 2023-10-25 NOTE — Telephone Encounter (Signed)
 Attempted to reach pt by phone and mail box is full

## 2023-10-26 NOTE — Telephone Encounter (Signed)
 Mailbox is full.

## 2023-10-27 NOTE — Telephone Encounter (Signed)
 Colon EMR and office visit  scheduled, pt instructed and medications reviewed.  Patient instructions mailed to home.  Patient to call with any questions or concerns.  The pt will pick up prep this week

## 2023-11-11 DIAGNOSIS — H9191 Unspecified hearing loss, right ear: Secondary | ICD-10-CM | POA: Diagnosis not present

## 2023-11-11 DIAGNOSIS — H6121 Impacted cerumen, right ear: Secondary | ICD-10-CM | POA: Diagnosis not present

## 2023-11-18 ENCOUNTER — Encounter: Payer: Self-pay | Admitting: Gastroenterology

## 2023-11-18 ENCOUNTER — Ambulatory Visit: Payer: Self-pay | Admitting: Gastroenterology

## 2023-11-18 ENCOUNTER — Other Ambulatory Visit (INDEPENDENT_AMBULATORY_CARE_PROVIDER_SITE_OTHER)

## 2023-11-18 ENCOUNTER — Telehealth: Payer: Self-pay | Admitting: Gastroenterology

## 2023-11-18 ENCOUNTER — Ambulatory Visit: Admitting: Gastroenterology

## 2023-11-18 VITALS — BP 124/72 | HR 76 | Ht 68.0 in | Wt 232.4 lb

## 2023-11-18 DIAGNOSIS — Z860102 Personal history of hyperplastic colon polyps: Secondary | ICD-10-CM

## 2023-11-18 DIAGNOSIS — K635 Polyp of colon: Secondary | ICD-10-CM | POA: Insufficient documentation

## 2023-11-18 DIAGNOSIS — R933 Abnormal findings on diagnostic imaging of other parts of digestive tract: Secondary | ICD-10-CM | POA: Diagnosis not present

## 2023-11-18 DIAGNOSIS — Z860101 Personal history of adenomatous and serrated colon polyps: Secondary | ICD-10-CM | POA: Insufficient documentation

## 2023-11-18 LAB — CBC WITH DIFFERENTIAL/PLATELET
Basophils Absolute: 0 K/uL (ref 0.0–0.1)
Basophils Relative: 0.7 % (ref 0.0–3.0)
Eosinophils Absolute: 0.4 K/uL (ref 0.0–0.7)
Eosinophils Relative: 6.1 % — ABNORMAL HIGH (ref 0.0–5.0)
HCT: 42.5 % (ref 39.0–52.0)
Hemoglobin: 14.6 g/dL (ref 13.0–17.0)
Lymphocytes Relative: 18.2 % (ref 12.0–46.0)
Lymphs Abs: 1.1 K/uL (ref 0.7–4.0)
MCHC: 34.4 g/dL (ref 30.0–36.0)
MCV: 91.6 fl (ref 78.0–100.0)
Monocytes Absolute: 0.5 K/uL (ref 0.1–1.0)
Monocytes Relative: 9 % (ref 3.0–12.0)
Neutro Abs: 3.9 K/uL (ref 1.4–7.7)
Neutrophils Relative %: 66 % (ref 43.0–77.0)
Platelets: 128 K/uL — ABNORMAL LOW (ref 150.0–400.0)
RBC: 4.64 Mil/uL (ref 4.22–5.81)
RDW: 15 % (ref 11.5–15.5)
WBC: 6 K/uL (ref 4.0–10.5)

## 2023-11-18 LAB — BASIC METABOLIC PANEL WITH GFR
BUN: 22 mg/dL (ref 6–23)
CO2: 33 meq/L — ABNORMAL HIGH (ref 19–32)
Calcium: 9.8 mg/dL (ref 8.4–10.5)
Chloride: 100 meq/L (ref 96–112)
Creatinine, Ser: 1.13 mg/dL (ref 0.40–1.50)
GFR: 69.51 mL/min (ref >60.00)
Glucose, Bld: 178 mg/dL — ABNORMAL HIGH (ref 70–99)
Potassium: 5 meq/L (ref 3.5–5.1)
Sodium: 140 meq/L (ref 135–145)

## 2023-11-18 LAB — PROTIME-INR
INR: 1 ratio (ref 0.8–1.0)
Prothrombin Time: 10.9 s (ref 9.6–13.1)

## 2023-11-18 NOTE — Telephone Encounter (Signed)
 Patient states he already picked up a colon prep from Dr. Jeronimo office and it does not match the prep instructions we gave him today. Asked patient which prep kit does he have at home. Patient read the generic suprep label off to me. Informed patient that is the same prep, it's just the generic. Patient verbalized understanding.

## 2023-11-18 NOTE — Telephone Encounter (Signed)
 Inbound call from patient stating he received the wrong prep medication for upcoming procedure on 12/01/22 at [facility] and would like the right one sent to pharmacy. Patient requesting a call back  Please advise  Thank you

## 2023-11-18 NOTE — Progress Notes (Signed)
 GASTROENTEROLOGY OUTPATIENT CLINIC VISIT   Primary Care Provider Juan Bottcher, NP [name] [place] [address] [phone]  Referring Provider Dr. Dianna  Patient Profile: Juan [name] is a 63 y.o. male with a pmh significant for hypertension, hyperlipidemia, diabetes, COPD, OSA, nephrolithiasis, arthritis, status post cholecystectomy, GERD, colon polyps (cecal polyp in situ).  The patient presents to the [facility] for an evaluation and management of problem(s) noted below:  Problem List 1. Cecal polyp   2. Hx of adenomatous colonic polyps   3. Abnormal colonoscopy    Discussed the use of AI scribe software for clinical note transcription with the patient, who gave verbal consent to proceed.  History of Present Illness Juan [name] is a 63 year old male who presents for evaluation and potential removal of a large adenoma of the cecum. He was referred by Dr. Dianna for evaluation.  This is his first visit to the [facility] GI clinic.  He has history of previous colonoscopies and polyp resection.  More recently, this year, he had a colonoscopy with a large multilobulated polyp found in the cecum.  This was sampled but not removed.  He is not having any issues of changes in bowel habits or blood in his stools.  He has normal bowel movements.  He takes Ozempic on Sunday nights.  No family history of colon cancer.   GI Review of Systems Positive as above Negative for dysphagia, odynophagia, melena, hematochezia, pain  Review of Systems General: Denies fevers/chills/weight loss unintentionally Cardiovascular: Denies chest pain Pulmonary: Denies shortness of breath Gastroenterological: See HPI Genitourinary: Denies darkened urine Hematological: Denies easy bruising/bleeding Dermatological: Denies jaundice Psychological: Mood is stable  Medications Current Outpatient Medications  Medication Sig Dispense Refill   ADVAIR DISKUS  500-50 MCG/DOSE AEPB Inhale 1 puff into the lungs 2 (two) times daily.     albuterol  (VENTOLIN  HFA) 108 (90 Base) MCG/ACT inhaler Inhale 1-2 puffs into the lungs every 6 (six) hours as needed for wheezing or shortness of breath. 18 g 0   alfuzosin (UROXATRAL) 10 MG 24 hr tablet TAKE 1 TABLET BY MOUTH EVERYDAY AT BEDTIME 90 tablet 3   aspirin  EC 81 MG tablet Take 81 mg by mouth daily.     atorvastatin  (LIPITOR) 40 MG tablet Take 1 tablet (40 mg total) by mouth daily. 90 tablet 3   carvedilol  (COREG ) 12.5 MG tablet Take 1 tablet (12.5 mg total) by mouth 2 (two) times daily with a meal. 180 tablet 3   dorzolamide -timolol  (COSOPT ) 22.3-6.8 MG/ML ophthalmic solution Place 1 drop into the right eye 2 (two) times daily. 10 mL 10   Empagliflozin-metFORMIN HCl (SYNJARDY) 12.06-998 MG TABS Take 1 tablet by mouth 2 (two) times a day.      esomeprazole (NEXIUM) 40 MG capsule Take 40 mg by mouth daily at 12 noon.     famotidine  (PEPCID ) 40 MG tablet Take by mouth.     insulin  glargine, 2 Unit Dial, (TOUJEO MAX SOLOSTAR) 300 UNIT/ML Solostar Pen Inject 40 Units into the skin daily.      isosorbide  mononitrate (IMDUR ) 30 MG 24 hr tablet Take 1 tablet (30 mg total) by mouth daily. 90 tablet 3   lisinopril  (ZESTRIL ) 40 MG tablet Take 1 tablet (40 mg total) by mouth daily. 90 tablet 3   nitroGLYCERIN (NITROSTAT) 0.4 MG SL tablet Place 0.4 mg under the tongue daily.     OZEMPIC, 0.25 OR 0.5 MG/DOSE, 2 MG/3ML SOPN Inject 0.25 mg into the  skin once a week.     WIXELA INHUB 250-50 MCG/ACT AEPB Inhale 1 puff into the lungs 2 (two) times daily.     No current facility-administered medications for this visit.    Allergies Allergies  Allergen Reactions   Naproxen Shortness Of Breath   Naproxen Sodium Shortness Of Breath   Tiotropium Bromide Shortness Of Breath   Penicillins Rash    Has patient had a PCN reaction causing immediate rash, facial/tongue/throat swelling, SOB or lightheadedness with hypotension: Yes Has  patient had a PCN reaction causing severe rash involving mucus membranes or skin necrosis: No Has patient had a PCN reaction that required hospitalization No Has patient had a PCN reaction occurring within the last 10 years: No If all of the above answers are NO, then may proceed with Cephalosporin use.  Has patient had a PCN reaction causing immediate rash, facial/tongue/throat swelling, SOB or lightheadedness with hypotension: Yes Has patient had a PCN reaction causing severe rash involving mucus membranes or skin necrosis: No Has patient had a PCN reaction that required hospitalization No Has patient had a PCN reaction occurring within the last 10 years: No If all of the above answers are NO, then may proceed with Cephalosporin use.     Histories Past Medical History:  Diagnosis Date   Arthritis    Asthma    COPD (chronic obstructive pulmonary disease) (HCC)    Diabetes mellitus    Type 2   Diabetic retinopathy (HCC)    NPDR OU   GERD (gastroesophageal reflux disease)    H/O hiatal hernia    Heart block    History of kidney stones    Hyperlipemia    Hypertension    Hypertensive retinopathy    OU   Nasal polyps    Pneumonia 2014   Shortness of breath    Sleep apnea    pt has CPAP but is unable to use it d/t having polyps in his nose. Pt stated I need to call and get a CPAP mask instead   Stomach ulcer    Past Surgical History:  Procedure Laterality Date   CATARACT EXTRACTION Right 07/05/2018   Dr. Roz   CATARACT EXTRACTION Left 07/12/2018   Dr. Roz   CHOLECYSTECTOMY N/A 01/23/2015   Procedure: LAPAROSCOPIC CHOLECYSTECTOMY ;  Surgeon: Dann Hummer, MD;  Location: [facility] OR;  Service: General;  Laterality: N/A;   COLONOSCOPY W/ POLYPECTOMY     ESOPHAGOGASTRODUODENOSCOPY     KNEE ARTHROSCOPY  03/24/2011   Procedure: ARTHROSCOPY KNEE;  Surgeon: Norleen LITTIE Gavel, MD;  Location:  SURGERY CENTER;  Service: Orthopedics;  Laterality: Right;  partial medial  menisectomy, chondroplaty patella, and medial medial plica   LEFT HEART CATH AND CORONARY ANGIOGRAPHY N/A 06/29/2019   Procedure: LEFT HEART CATH AND CORONARY ANGIOGRAPHY;  Surgeon: Darron Deatrice LABOR, MD;  Location: [facility];  Service: Cardiovascular;  Laterality: N/A;   ROTATOR CUFF REPAIR Left    Social History   Socioeconomic History   Marital status: Single    Spouse name: Not on file   Number of children: 2   Years of education: Not on file   Highest education level: Not on file  Occupational History   Not on file  Tobacco Use   Smoking status: Former    Current packs/day: 0.00    Average packs/day: 3.0 packs/day for 20.0 years (60.0 ttl pk-yrs)    Types: Cigarettes    Quit date: 2000    Years since quitting: 25.8  Smokeless tobacco: Current    Types: Chew   Tobacco comments:    1 can/daily  Vaping Use   Vaping status: Never Used  Substance and Sexual Activity   Alcohol use: Yes    Alcohol/week: 2.0 standard drinks of alcohol    Types: 2 Cans of beer per week    Comment: social, 1x weekly   Drug use: No   Sexual activity: Yes    Partners: Female    Comment: married  Other Topics Concern   Not on file  Social History Narrative   Not on file   Social Drivers of Health   Financial Resource Strain: Low Risk  (02/07/2021)   Received from [facility]   Overall Financial Resource Strain (CARDIA)    Difficulty of Paying Living Expenses: Not hard at all  Food Insecurity: No Food Insecurity (02/27/2021)   Received from [facility]   Hunger Vital Sign    Within the past 12 months, you worried that your food would run out before you got the money to buy more.: Never true    Within the past 12 months, the food you bought just didn't last and you didn't have money to get more.: Never true  Transportation Needs: Not on file  Physical Activity: Not on file  Stress: No Stress Concern Present (02/07/2021)   Received from [facility]  Occupational Health - Occupational Stress Questionnaire    Feeling of Stress : Not at all  Social Connections: Unknown (06/04/2021)   Received from [facility]   Social Network    Social Network: Not on file  Intimate Partner Violence: Unknown (05/04/2021)   Received from [facility]   HITS    Physically Hurt: Not on file    Insult or Talk Down To: Not on file    Threaten Physical Harm: Not on file    Scream or Curse: Not on file   Family History  Problem Relation Age of Onset   Diabetes Father    Coronary artery disease Father    Other Brother        brain tumor   Dementia Maternal Grandmother    Cancer Maternal Grandfather        type unknown   Emphysema Paternal Grandmother    Emphysema Paternal Grandfather    Colon cancer Neg Hx    Esophageal cancer Neg Hx    Inflammatory bowel disease Neg Hx    Liver disease Neg Hx    Pancreatic cancer Neg Hx    Rectal cancer Neg Hx    Stomach cancer Neg Hx    I have reviewed his medical, social, and family history in detail and updated the electronic medical record as necessary.    PHYSICAL EXAMINATION  BP 124/72 (BP Location: Left Arm, Patient Position: Sitting, Cuff Size: Normal)   Pulse 76 Comment: irregular  Ht 5' 8 (1.727 m) Comment: height measured without shoes  Wt 232 lb 6 oz (105.4 kg)   BMI 35.33 kg/m  Wt Readings from Last 3 Encounters:  11/18/23 232 lb 6 oz (105.4 kg)  10/21/23 228 lb 12.8 oz (103.8 kg)  04/08/23 239 lb 6.4 oz (108.6 kg)  GEN: NAD, appears stated age, doesn't appear chronically ill PSYCH: Cooperative, without pressured speech EYE: Conjunctivae pink, sclerae anicteric ENT: MMM CV: Nontachycardic RESP: No audible wheezing GI: NABS, soft, NT/ND, without rebound GU: DRE shows MSK/EXT: No significant lower extremity edema SKIN: No jaundice NEURO:  Alert & Oriented x  3, no focal deficits   REVIEW OF DATA  I reviewed the following data at the time of this encounter:  GI Procedures and Studies   September 2025 colonoscopy Five 3 to 12 mm polyps in the ascending/hepatic/descending/sigmoid/rectum removed with hot snare. A 20 mm polypoid lesion found in the cecum.  No bleeding present.  Biopsied. Internal hemorrhoids.   Pathology Colon polyps = Tubular adenoma and hyperplastic polyp   Cecal biopsy = fragments of TA negative for high-grade dysplasia.  Laboratory Studies  Reviewed those in epic  Imaging Studies  No relevant studies to review   ASSESSMENT  Mr. Orrego is a 63 y.o. male.  The patient is seen today for evaluation and management of:  1. Cecal polyp   2. Hx of adenomatous colonic polyps   3. Abnormal colonoscopy    The patient is clinically and hemodynamically stable at this time.  Based upon the description and endoscopic pictures I do feel that it is reasonable to pursue an Advanced Polypectomy attempt of the polyp/lesion.  We discussed some of the techniques of advanced polypectomy which include Endoscopic Mucosal Resection, OVESCO Full-Thickness Resection, Endorotor Morcellation, and Tissue Ablation via Fulguration.  We also reviewed images of typical techniques as noted above.  The risks and benefits of endoscopic evaluation were discussed with the patient; these include but are not limited to the risk of perforation, infection, bleeding, missed lesions, lack of diagnosis, severe illness requiring hospitalization, as well as anesthesia and sedation related illnesses.  During attempts at advanced resection, the risks of bleeding and perforation/leak are increased as opposed to diagnostic and screening procedures, and that was discussed with the patient as well.   In addition, I explained that with the possible need for piecemeal resection, subsequent short-interval endoscopic evaluation for follow up and potential retreatment of the lesion/area may be necessary.  I did offer, a referral to surgery in order for patient to have opportunity to discuss surgical  management/intervention prior to finalizing decision for attempt at endoscopic removal, however, the patient deferred on this.  If, after attempt at removal of the polyp/lesion, it is found that the patient has a complication or that an invasive lesion or malignant lesion is found, or that the polyp/lesion continues to recur, the patient is aware and understands that surgery may still be indicated/required.  All patient questions were answered, to the best of my ability, and the patient agrees to the aforementioned plan of action with follow-up as indicated.   PLAN  Proceed with scheduled colonoscopy with advanced endoscopic resection attempt Preprocedure labs as outlined below Follow-up to be dictated based on pathology results   Orders Placed This Encounter  Procedures   CBC with Differential/Platelet   Basic Metabolic Panel (BMET)   Protime-INR    New Prescriptions   No medications on file   Modified Medications   No medications on file    Planned Follow Up No follow-ups on file.   Total Time in Face-to-Face and in Coordination of Care for patient including independent/personal interpretation/review of prior testing, medical history, examination, medication adjustment, communicating results with the patient directly, and documentation within the EHR is 45 minutes.   Aloha Finner, MD  Gastroenterology Advanced Endoscopy Office # [phone]

## 2023-11-18 NOTE — H&P (View-Only) (Signed)
 GASTROENTEROLOGY OUTPATIENT CLINIC VISIT   Primary Care Provider Juan Bottcher, NP [name] [place] [address] [phone]  Referring Provider Dr. Dianna  Patient Profile: Juan [name] is a 63 y.o. male with a pmh significant for hypertension, hyperlipidemia, diabetes, COPD, OSA, nephrolithiasis, arthritis, status post cholecystectomy, GERD, colon polyps (cecal polyp in situ).  The patient presents to the [facility] for an evaluation and management of problem(s) noted below:  Problem List 1. Cecal polyp   2. Hx of adenomatous colonic polyps   3. Abnormal colonoscopy    Discussed the use of AI scribe software for clinical note transcription with the patient, who gave verbal consent to proceed.  History of Present Illness Juan [name] is a 63 year old male who presents for evaluation and potential removal of a large adenoma of the cecum. He was referred by Dr. Dianna for evaluation.  This is his first visit to the [facility] GI clinic.  He has history of previous colonoscopies and polyp resection.  More recently, this year, he had a colonoscopy with a large multilobulated polyp found in the cecum.  This was sampled but not removed.  He is not having any issues of changes in bowel habits or blood in his stools.  He has normal bowel movements.  He takes Ozempic on Sunday nights.  No family history of colon cancer.   GI Review of Systems Positive as above Negative for dysphagia, odynophagia, melena, hematochezia, pain  Review of Systems General: Denies fevers/chills/weight loss unintentionally Cardiovascular: Denies chest pain Pulmonary: Denies shortness of breath Gastroenterological: See HPI Genitourinary: Denies darkened urine Hematological: Denies easy bruising/bleeding Dermatological: Denies jaundice Psychological: Mood is stable  Medications Current Outpatient Medications  Medication Sig Dispense Refill   ADVAIR DISKUS  500-50 MCG/DOSE AEPB Inhale 1 puff into the lungs 2 (two) times daily.     albuterol  (VENTOLIN  HFA) 108 (90 Base) MCG/ACT inhaler Inhale 1-2 puffs into the lungs every 6 (six) hours as needed for wheezing or shortness of breath. 18 g 0   alfuzosin (UROXATRAL) 10 MG 24 hr tablet TAKE 1 TABLET BY MOUTH EVERYDAY AT BEDTIME 90 tablet 3   aspirin  EC 81 MG tablet Take 81 mg by mouth daily.     atorvastatin  (LIPITOR) 40 MG tablet Take 1 tablet (40 mg total) by mouth daily. 90 tablet 3   carvedilol  (COREG ) 12.5 MG tablet Take 1 tablet (12.5 mg total) by mouth 2 (two) times daily with a meal. 180 tablet 3   dorzolamide -timolol  (COSOPT ) 22.3-6.8 MG/ML ophthalmic solution Place 1 drop into the right eye 2 (two) times daily. 10 mL 10   Empagliflozin-metFORMIN HCl (SYNJARDY) 12.06-998 MG TABS Take 1 tablet by mouth 2 (two) times a day.      esomeprazole (NEXIUM) 40 MG capsule Take 40 mg by mouth daily at 12 noon.     famotidine  (PEPCID ) 40 MG tablet Take by mouth.     insulin  glargine, 2 Unit Dial, (TOUJEO MAX SOLOSTAR) 300 UNIT/ML Solostar Pen Inject 40 Units into the skin daily.      isosorbide  mononitrate (IMDUR ) 30 MG 24 hr tablet Take 1 tablet (30 mg total) by mouth daily. 90 tablet 3   lisinopril  (ZESTRIL ) 40 MG tablet Take 1 tablet (40 mg total) by mouth daily. 90 tablet 3   nitroGLYCERIN (NITROSTAT) 0.4 MG SL tablet Place 0.4 mg under the tongue daily.     OZEMPIC, 0.25 OR 0.5 MG/DOSE, 2 MG/3ML SOPN Inject 0.25 mg into the  skin once a week.     WIXELA INHUB 250-50 MCG/ACT AEPB Inhale 1 puff into the lungs 2 (two) times daily.     No current facility-administered medications for this visit.    Allergies Allergies  Allergen Reactions   Naproxen Shortness Of Breath   Naproxen Sodium Shortness Of Breath   Tiotropium Bromide Shortness Of Breath   Penicillins Rash    Has patient had a PCN reaction causing immediate rash, facial/tongue/throat swelling, SOB or lightheadedness with hypotension: Yes Has  patient had a PCN reaction causing severe rash involving mucus membranes or skin necrosis: No Has patient had a PCN reaction that required hospitalization No Has patient had a PCN reaction occurring within the last 10 years: No If all of the above answers are NO, then may proceed with Cephalosporin use.  Has patient had a PCN reaction causing immediate rash, facial/tongue/throat swelling, SOB or lightheadedness with hypotension: Yes Has patient had a PCN reaction causing severe rash involving mucus membranes or skin necrosis: No Has patient had a PCN reaction that required hospitalization No Has patient had a PCN reaction occurring within the last 10 years: No If all of the above answers are NO, then may proceed with Cephalosporin use.     Histories Past Medical History:  Diagnosis Date   Arthritis    Asthma    COPD (chronic obstructive pulmonary disease) (HCC)    Diabetes mellitus    Type 2   Diabetic retinopathy (HCC)    NPDR OU   GERD (gastroesophageal reflux disease)    H/O hiatal hernia    Heart block    History of kidney stones    Hyperlipemia    Hypertension    Hypertensive retinopathy    OU   Nasal polyps    Pneumonia 2014   Shortness of breath    Sleep apnea    pt has CPAP but is unable to use it d/t having polyps in his nose. Pt stated I need to call and get a CPAP mask instead   Stomach ulcer    Past Surgical History:  Procedure Laterality Date   CATARACT EXTRACTION Right 07/05/2018   Dr. Roz   CATARACT EXTRACTION Left 07/12/2018   Dr. Roz   CHOLECYSTECTOMY N/A 01/23/2015   Procedure: LAPAROSCOPIC CHOLECYSTECTOMY ;  Surgeon: Juan Hummer, MD;  Location[facility] OR;  Service: General;  Laterality: N/A;   COLONOSCOPY W/ POLYPECTOMY     ESOPHAGOGASTRODUODENOSCOPY     KNEE ARTHROSCOPY  03/24/2011   Procedure: ARTHROSCOPY KNEE;  Surgeon: Juan LITTIE Gavel, MD;  Location:  SURGERY CENTER;  Service: Orthopedics;  Laterality: Right;  partial medial  menisectomy, chondroplaty patella, and medial medial plica   LEFT HEART CATH AND CORONARY ANGIOGRAPHY N/A 06/29/2019   Procedure: LEFT HEART CATH AND CORONARY ANGIOGRAPHY;  Surgeon: Darron Deatrice LABOR, MD;  Location: [facility] INVASIVE CV LAB;  Service: Cardiovascular;  Laterality: N/A;   ROTATOR CUFF REPAIR Left    Social History   Socioeconomic History   Marital status: Single    Spouse name: Not on file   Number of children: 2   Years of education: Not on file   Highest education level: Not on file  Occupational History   Not on file  Tobacco Use   Smoking status: Former    Current packs/day: 0.00    Average packs/day: 3.0 packs/day for 20.0 years (60.0 ttl pk-yrs)    Types: Cigarettes    Quit date: 2000    Years since quitting: 25.8  Smokeless tobacco: Current    Types: Chew   Tobacco comments:    1 can/daily  Vaping Use   Vaping status: Never Used  Substance and Sexual Activity   Alcohol use: Yes    Alcohol/week: 2.0 standard drinks of alcohol    Types: 2 Cans of beer per week    Comment: social, 1x weekly   Drug use: No   Sexual activity: Yes    Partners: Female    Comment: married  Other Topics Concern   Not on file  Social History Narrative   Not on file   Social Drivers of Health   Financial Resource Strain: Low Risk  (02/07/2021)   Received from [facility]   Overall Financial Resource Strain (CARDIA)    Difficulty of Paying Living Expenses: Not hard at all  Food Insecurity: No Food Insecurity (02/27/2021)   Received from [facility]   Hunger Vital Sign    Within the past 12 months, you worried that your food would run out before you got the money to buy more.: Never true    Within the past 12 months, the food you bought just didn't last and you didn't have money to get more.: Never true  Transportation Needs: Not on file  Physical Activity: Not on file  Stress: No Stress Concern Present (02/07/2021)   Received from [facility]  Occupational Health - Occupational Stress Questionnaire    Feeling of Stress : Not at all  Social Connections: Unknown (06/04/2021)   Received from [facility]   Social Network    Social Network: Not on file  Intimate Partner Violence: Unknown (05/04/2021)   Received from [facility]   HITS    Physically Hurt: Not on file    Insult or Talk Down To: Not on file    Threaten Physical Harm: Not on file    Scream or Curse: Not on file   Family History  Problem Relation Age of Onset   Diabetes Father    Coronary artery disease Father    Other Brother        brain tumor   Dementia Maternal Grandmother    Cancer Maternal Grandfather        type unknown   Emphysema Paternal Grandmother    Emphysema Paternal Grandfather    Colon cancer Neg Hx    Esophageal cancer Neg Hx    Inflammatory bowel disease Neg Hx    Liver disease Neg Hx    Pancreatic cancer Neg Hx    Rectal cancer Neg Hx    Stomach cancer Neg Hx    I have reviewed his medical, social, and family history in detail and updated the electronic medical record as necessary.    PHYSICAL EXAMINATION  BP 124/72 (BP Location: Left Arm, Patient Position: Sitting, Cuff Size: Normal)   Pulse 76 Comment: irregular  Ht 5' 8 (1.727 m) Comment: height measured without shoes  Wt 232 lb 6 oz (105.4 kg)   BMI 35.33 kg/m  Wt Readings from Last 3 Encounters:  11/18/23 232 lb 6 oz (105.4 kg)  10/21/23 228 lb 12.8 oz (103.8 kg)  04/08/23 239 lb 6.4 oz (108.6 kg)  GEN: NAD, appears stated age, doesn't appear chronically ill PSYCH: Cooperative, without pressured speech EYE: Conjunctivae pink, sclerae anicteric ENT: MMM CV: Nontachycardic RESP: No audible wheezing GI: NABS, soft, NT/ND, without rebound GU: DRE shows MSK/EXT: No significant lower extremity edema SKIN: No jaundice NEURO:  Alert & Oriented x  3, no focal deficits   REVIEW OF DATA  I reviewed the following data at the time of this encounter:  GI Procedures and Studies   September 2025 colonoscopy Five 3 to 12 mm polyps in the ascending/hepatic/descending/sigmoid/rectum removed with hot snare. A 20 mm polypoid lesion found in the cecum.  No bleeding present.  Biopsied. Internal hemorrhoids.   Pathology Colon polyps = Tubular adenoma and hyperplastic polyp   Cecal biopsy = fragments of TA negative for high-grade dysplasia.  Laboratory Studies  Reviewed those in epic  Imaging Studies  No relevant studies to review   ASSESSMENT  Mr. Orrego is a 63 y.o. male.  The patient is seen today for evaluation and management of:  1. Cecal polyp   2. Hx of adenomatous colonic polyps   3. Abnormal colonoscopy    The patient is clinically and hemodynamically stable at this time.  Based upon the description and endoscopic pictures I do feel that it is reasonable to pursue an Advanced Polypectomy attempt of the polyp/lesion.  We discussed some of the techniques of advanced polypectomy which include Endoscopic Mucosal Resection, OVESCO Full-Thickness Resection, Endorotor Morcellation, and Tissue Ablation via Fulguration.  We also reviewed images of typical techniques as noted above.  The risks and benefits of endoscopic evaluation were discussed with the patient; these include but are not limited to the risk of perforation, infection, bleeding, missed lesions, lack of diagnosis, severe illness requiring hospitalization, as well as anesthesia and sedation related illnesses.  During attempts at advanced resection, the risks of bleeding and perforation/leak are increased as opposed to diagnostic and screening procedures, and that was discussed with the patient as well.   In addition, I explained that with the possible need for piecemeal resection, subsequent short-interval endoscopic evaluation for follow up and potential retreatment of the lesion/area may be necessary.  I did offer, a referral to surgery in order for patient to have opportunity to discuss surgical  management/intervention prior to finalizing decision for attempt at endoscopic removal, however, the patient deferred on this.  If, after attempt at removal of the polyp/lesion, it is found that the patient has a complication or that an invasive lesion or malignant lesion is found, or that the polyp/lesion continues to recur, the patient is aware and understands that surgery may still be indicated/required.  All patient questions were answered, to the best of my ability, and the patient agrees to the aforementioned plan of action with follow-up as indicated.   PLAN  Proceed with scheduled colonoscopy with advanced endoscopic resection attempt Preprocedure labs as outlined below Follow-up to be dictated based on pathology results   Orders Placed This Encounter  Procedures   CBC with Differential/Platelet   Basic Metabolic Panel (BMET)   Protime-INR    New Prescriptions   No medications on file   Modified Medications   No medications on file    Planned Follow Up No follow-ups on file.   Total Time in Face-to-Face and in Coordination of Care for patient including independent/personal interpretation/review of prior testing, medical history, examination, medication adjustment, communicating results with the patient directly, and documentation within the EHR is 45 minutes.   Aloha Finner, MD  Gastroenterology Advanced Endoscopy Office # [phone]

## 2023-11-18 NOTE — Patient Instructions (Signed)
 Your provider has requested that you go to the basement level for lab work before leaving today. Press B on the elevator. The lab is located at the first door on the left as you exit the elevator.  You have been scheduled for a colonoscopy. Please follow written instructions given to you at your visit today.   If you use inhalers (even only as needed), please bring them with you on the day of your procedure.  DO NOT TAKE 7 DAYS PRIOR TO TEST- Trulicity (dulaglutide) Ozempic, Wegovy (semaglutide) Mounjaro (tirzepatide) Bydureon Bcise (exanatide extended release)  DO NOT TAKE 1 DAY PRIOR TO YOUR TEST Rybelsus (semaglutide) Adlyxin (lixisenatide) Victoza (liraglutide) Byetta (exanatide) ___________________________________________________________________________   _______________________________________________________  If your blood pressure at your visit was 140/90 or greater, please contact your primary care physician to follow up on this.  _______________________________________________________  If you are age 78 or older, your body mass index should be between 23-30. Your Body mass index is 35.33 kg/m. If this is out of the aforementioned range listed, please consider follow up with your Primary Care Provider.  If you are age 86 or younger, your body mass index should be between 19-25. Your Body mass index is 35.33 kg/m. If this is out of the aformentioned range listed, please consider follow up with your Primary Care Provider.   ________________________________________________________  The [facility] providers would like to encourage you to use MYCHART to communicate with providers for non-urgent requests or questions.  Due to long hold times on the telephone, sending your provider a message by [facility] may be a faster and more efficient way to get a response.  Please allow 48 business hours for a response.  Please remember that this is for non-urgent requests.   _______________________________________________________  [facility] is using a team-based approach to care.  Your team is made up of your doctor and two to three APPS. Our APPS (Nurse Practitioners and Physician Assistants) work with your physician to ensure care continuity for you. They are fully qualified to address your health concerns and develop a treatment plan. They communicate directly with your gastroenterologist to care for you. Seeing the Advanced Practice Practitioners on your physician's team can help you by facilitating care more promptly, often allowing for earlier appointments, access to diagnostic testing, procedures, and other specialty referrals.

## 2023-11-23 ENCOUNTER — Telehealth: Payer: Self-pay | Admitting: Gastroenterology

## 2023-11-23 NOTE — Telephone Encounter (Signed)
 Procedure:Colonoscopy Procedure date: 12/01/23 Procedure location: [facility] Arrival Time: 12:49 pm Spoke with the patient Y/N: Yes Any prep concerns? No  Has the patient obtained the prep from the pharmacy ? Yes Do you have a care partner and transportation: Yes Any additional concerns? No

## 2023-11-24 ENCOUNTER — Encounter (HOSPITAL_COMMUNITY): Payer: Self-pay | Admitting: Gastroenterology

## 2023-11-24 NOTE — Progress Notes (Signed)
Attempted to obtain medical history via telephone, unable to reach at this time. Unable to leave voicemail to return pre surgical testing department's phone call,due to mailbox full.  

## 2023-11-25 ENCOUNTER — Ambulatory Visit (HOSPITAL_COMMUNITY)
Admission: RE | Admit: 2023-11-25 | Discharge: 2023-11-25 | Disposition: A | Discharge status: Home / Self Care | Source: Ambulatory Visit | Attending: Cardiology | Admitting: Cardiology

## 2023-11-25 DIAGNOSIS — I493 Ventricular premature depolarization: Secondary | ICD-10-CM | POA: Insufficient documentation

## 2023-11-25 DIAGNOSIS — I10 Essential (primary) hypertension: Secondary | ICD-10-CM | POA: Diagnosis not present

## 2023-11-25 DIAGNOSIS — L821 Other seborrheic keratosis: Secondary | ICD-10-CM | POA: Diagnosis not present

## 2023-11-25 DIAGNOSIS — I788 Other diseases of capillaries: Secondary | ICD-10-CM | POA: Diagnosis not present

## 2023-11-25 DIAGNOSIS — D2361 Other benign neoplasm of skin of right upper limb, including shoulder: Secondary | ICD-10-CM | POA: Diagnosis not present

## 2023-11-25 LAB — ECHOCARDIOGRAM COMPLETE
Area-P 1/2: 4.36 cm2
S' Lateral: 2.8 cm

## 2023-11-29 NOTE — Progress Notes (Signed)
 [facility] - Clinic Note  12/02/2023     CHIEF COMPLAINT Patient presents for Retina Follow Up   HISTORY OF PRESENT ILLNESS: Juan [name] is a 63 y.o. male who presents to the clinic today for:   HPI     Retina Follow Up   Patient presents with  Diabetic Retinopathy.  In both eyes.  This started 6 weeks ago.  I, the attending physician,  performed the HPI with the patient and updated documentation appropriately.        Comments   Patient here for 6 weeks retina follow up for NPDR OU. Patient states vision about the same. No eye pain.       Last edited by Valdemar Rogue, MD on 12/04/2023  3:44 PM.    Patient states he's had a lot of checks-liver, colon, heart, etc. All good reports.   Referring physician: Teresa Channel, MD [phone] [address] [place],  [place] [address]  HISTORICAL INFORMATION:   Selected notes from the MEDICAL RECORD NUMBER Referred by Dr. Oneil Platts for concern of CME s/p cataract sx   CURRENT MEDICATIONS: Current Outpatient Medications (Ophthalmic Drugs)  Medication Sig   dorzolamide -timolol  (COSOPT ) 22.3-6.8 MG/ML ophthalmic solution Place 1 drop into the right eye 2 (two) times daily.   No current facility-administered medications for this visit. (Ophthalmic Drugs)   Current Outpatient Medications (Other)  Medication Sig   albuterol  (VENTOLIN  HFA) 108 (90 Base) MCG/ACT inhaler Inhale 1-2 puffs into the lungs every 6 (six) hours as needed for wheezing or shortness of breath.   alfuzosin (UROXATRAL) 10 MG 24 hr tablet TAKE 1 TABLET BY MOUTH EVERYDAY AT BEDTIME   aspirin  EC 81 MG tablet Take 81 mg by mouth daily.   atorvastatin  (LIPITOR) 40 MG tablet Take 1 tablet (40 mg total) by mouth daily.   carvedilol  (COREG ) 12.5 MG tablet Take 1 tablet (12.5 mg total) by mouth 2 (two) times daily with a meal.   Empagliflozin-metFORMIN HCl (SYNJARDY) 12.06-998 MG TABS Take 1 tablet by mouth 2 (two) times a day.     esomeprazole (NEXIUM) 40 MG capsule Take 40 mg by mouth daily at 12 noon.   insulin  glargine, 2 Unit Dial, (TOUJEO MAX SOLOSTAR) 300 UNIT/ML Solostar Pen Inject 40 Units into the skin daily.    isosorbide  mononitrate (IMDUR ) 30 MG 24 hr tablet Take 1 tablet (30 mg total) by mouth daily.   lisinopril  (ZESTRIL ) 40 MG tablet Take 1 tablet (40 mg total) by mouth daily.   nitroGLYCERIN (NITROSTAT) 0.4 MG SL tablet Place 0.4 mg under the tongue daily.   OZEMPIC, 0.25 OR 0.5 MG/DOSE, 2 MG/3ML SOPN Inject 0.25 mg into the skin once a week.   WIXELA INHUB 250-50 MCG/ACT AEPB Inhale 1 puff into the lungs 2 (two) times daily.   No current facility-administered medications for this visit. (Other)   REVIEW OF SYSTEMS: ROS   Positive for: Gastrointestinal, Musculoskeletal, Endocrine, Eyes, Respiratory Negative for: Constitutional, Neurological, Skin, Genitourinary, HENT, Cardiovascular, Psychiatric, Allergic/Imm, Heme/Lymph Last edited by Orval Asberry RAMAN, COA on 12/02/2023  8:11 AM.      ALLERGIES Allergies  Allergen Reactions   Naproxen Shortness Of Breath   Naproxen Sodium Shortness Of Breath   Tiotropium Bromide Shortness Of Breath   Penicillins Rash    Has patient had a PCN reaction causing immediate rash, facial/tongue/throat swelling, SOB or lightheadedness with hypotension: Yes Has patient had a PCN reaction causing severe rash involving mucus membranes or skin  necrosis: No Has patient had a PCN reaction that required hospitalization No Has patient had a PCN reaction occurring within the last 10 years: No If all of the above answers are NO, then may proceed with Cephalosporin use.  Has patient had a PCN reaction causing immediate rash, facial/tongue/throat swelling, SOB or lightheadedness with hypotension: Yes Has patient had a PCN reaction causing severe rash involving mucus membranes or skin necrosis: No Has patient had a PCN reaction that required hospitalization No Has patient had  a PCN reaction occurring within the last 10 years: No If all of the above answers are NO, then may proceed with Cephalosporin use.    PAST MEDICAL HISTORY Past Medical History:  Diagnosis Date   Arthritis    Asthma    COPD (chronic obstructive pulmonary disease) (HCC)    Diabetes mellitus    Type 2   Diabetic retinopathy (HCC)    NPDR OU   GERD (gastroesophageal reflux disease)    H/O hiatal hernia    Heart block    History of kidney stones    Hyperlipemia    Hypertension    Hypertensive retinopathy    OU   Nasal polyps    Pneumonia 2014   Shortness of breath    Sleep apnea    pt has CPAP but is unable to use it d/t having polyps in his nose. Pt stated I need to call and get a CPAP mask instead   Stomach ulcer    Past Surgical History:  Procedure Laterality Date   CATARACT EXTRACTION Right 07/05/2018   Dr. Roz   CATARACT EXTRACTION Left 07/12/2018   Dr. Roz   CHOLECYSTECTOMY N/A 01/23/2015   Procedure: LAPAROSCOPIC CHOLECYSTECTOMY ;  Surgeon: Dann Hummer, MD;  Location: [facility] OR;  Service: General;  Laterality: N/A;   COLONOSCOPY N/A 12/01/2023   Procedure: COLONOSCOPY;  Surgeon: Wilhelmenia Aloha Raddle., MD;  Location: [facility];  Service: Gastroenterology;  Laterality: N/A;   COLONOSCOPY W/ POLYPECTOMY     ENDOSCOPIC MUCOSAL RESECTION N/A 12/01/2023   Procedure: RESECTION, MUCOSAL LESION, GI TRACT, ENDOSCOPIC;  Surgeon: Wilhelmenia, Aloha Raddle., MD;  Location: [facility];  Service: Gastroenterology;  Laterality: N/A;   ESOPHAGOGASTRODUODENOSCOPY     KNEE ARTHROSCOPY  03/24/2011   Procedure: ARTHROSCOPY KNEE;  Surgeon: Norleen LITTIE Gavel, MD;  Location: [facility];  Service: Orthopedics;  Laterality: Right;  partial medial menisectomy, chondroplaty patella, and medial medial plica   LEFT HEART CATH AND CORONARY ANGIOGRAPHY N/A 06/29/2019   Procedure: LEFT HEART CATH AND CORONARY ANGIOGRAPHY;  Surgeon: Darron Deatrice LABOR, MD;  Location: [facility]  INVASIVE CV LAB;  Service: Cardiovascular;  Laterality: N/A;   ROTATOR CUFF REPAIR Left    FAMILY HISTORY Family History  Problem Relation Age of Onset   Diabetes Father    Coronary artery disease Father    Other Brother        brain tumor   Dementia Maternal Grandmother    Cancer Maternal Grandfather        type unknown   Emphysema Paternal Grandmother    Emphysema Paternal Grandfather    Colon cancer Neg Hx    Esophageal cancer Neg Hx    Inflammatory bowel disease Neg Hx    Liver disease Neg Hx    Pancreatic cancer Neg Hx    Rectal cancer Neg Hx    Stomach cancer Neg Hx    SOCIAL HISTORY Social History   Tobacco Use   Smoking status: Former    Current  packs/day: 0.00    Average packs/day: 3.0 packs/day for 20.0 years (60.0 ttl pk-yrs)    Types: Cigarettes    Quit date: 2000    Years since quitting: 25.8   Smokeless tobacco: Current    Types: Chew   Tobacco comments:    1 can/daily  Vaping Use   Vaping status: Never Used  Substance Use Topics   Alcohol use: Yes    Alcohol/week: 2.0 standard drinks of alcohol    Types: 2 Cans of beer per week    Comment: social, 1x weekly   Drug use: No       OPHTHALMIC EXAM:  Base Eye Exam     Visual Acuity (Snellen - Linear)       Right Left   Dist [place] 20/25 -2 20/50 -2   Dist ph [place] 20/20 -1 20/40 -2         Tonometry (Tonopen, 8:09 AM)       Right Left   Pressure 18 16         Pupils       Dark Light Shape React APD   Right 3 2 Round Brisk None   Left 3 2 Round Brisk None         Visual Fields (Counting fingers)       Left Right    Full Full         Extraocular Movement       Right Left    Full, Ortho Full, Ortho         Neuro/Psych     Oriented x3: Yes   Mood/Affect: Normal         Dilation     Both eyes: 1.0% Mydriacyl, 2.5% Phenylephrine @ 8:09 AM           Slit Lamp and Fundus Exam     Slit Lamp Exam       Right Left   Lids/Lashes Dermatochalasis - upper lid,  Telangiectasia, mild Meibomian gland dysfunction Dermatochalasis - upper lid, Telangiectasia, mild Meibomian gland dysfunction   Conjunctiva/Sclera White and quiet Mild nasal Pinguecula   Cornea Mild Arcus, Well healed temporal cataract wounds, Debris in tear film, trace Punctate epithelial erosions Arcus, Well healed temporal cataract wounds, trace PEE   Anterior Chamber Deep and clear Deep and clear   Iris Round and dilated, mild patch of atrophy at 0800, no NVI Round and dilated, No NVI   Lens PC IOL in good position PC IOL in good position   Anterior Vitreous Vitreous syneresis Vitreous syneresis         Fundus Exam       Right Left   Disc trace pallor, Sharp rim +Pallor, Sharp rim, Compact, mild PPP temporal   C/D Ratio 0.3 0.3   Macula Good foveal reflex, scattered IRH/DBH -- greatest temporal macula, trace cystic changes and punctate exudates temporal mac -- slightly improved, mild focal laser changes Blunted foveal reflex, +central atrophy and pigment clumping, scattered DBH and Mas -- improved, scattered punctate exudates and cystic changes - greatest superior and temporal macula -- slightly improved   Vessels attenuated, mild tortuosity attenuated, Tortuous   Periphery Attached, scattered DBH greatest posteriorly, prominent blot heme along SN arcades Attached, pigmented choroidal nevus superiorly with mild elevation, +drusen, no SRF or orange pigment -- unchanged from prior, scattered MA/DBH           IMAGING AND PROCEDURES  Imaging and Procedures for @TODAY @  OCT, Retina - OU - Both Eyes  Right Eye Quality was good. Central Foveal Thickness: 241. Progression has been stable. Findings include normal foveal contour, no SRF, intraretinal hyper-reflective material, intraretinal fluid, outer retinal atrophy, vitreomacular adhesion (Mild interval improvement in IRF/IRHM ST macula ).   Left Eye Quality was good. Central Foveal Thickness: 199. Progression has improved.  Findings include normal foveal contour, no SRF, intraretinal hyper-reflective material, intraretinal fluid, outer retinal atrophy (Persistent IRF/IRHM ST fovea and mac--slightly increased, persistent central ORA; Sub-RPE hyper-reflective mass consistent with choroidal nevus -- superior to disc, caught on widefield -- stable from prior).   Notes *Images captured and stored on drive  Diagnosis / Impression: +DME OU OD: Mild interval improvement in IRF/IRHM ST macula  OS: Mild Interval improvement in IRF/IRHM ST fovea and mac: persistent central ORA; Sub-RPE hyper-reflective mass consistent with choroidal nevus -- superior to disc, caught on widefield -- stable from prior  Clinical management:  See below  Abbreviations: NFP - Normal foveal profile. CME - cystoid macular edema. PED - pigment epithelial detachment. IRF - intraretinal fluid. SRF - subretinal fluid. EZ - ellipsoid zone. ERM - epiretinal membrane. ORA - outer retinal atrophy. ORT - outer retinal tubulation. SRHM - subretinal hyper-reflective material      Intravitreal Injection, Pharmacologic Agent - OS - Left Eye       Time Out 12/02/2023. 8:01 AM. Confirmed correct patient, procedure, site, and patient consented.   Anesthesia Topical anesthesia was used. Anesthetic medications included Lidocaine  2%, Proparacaine 0.5%.   Procedure Preparation included 5% betadine  to ocular surface, eyelid speculum. A supplied (32g) needle was used.   Injection: 6 mg faricimab -svoa 6 MG/0.05ML   Route: Intravitreal, Site: Left Eye   NDC: 49757-903-93, Lot: [id number], Expiration date: 10/31/2024, Waste: 0 mL   Post-op Post injection exam found visual acuity of at least counting fingers. The patient tolerated the procedure well. There were no complications. The patient received written and verbal post procedure care education. Post injection medications were not given.            ASSESSMENT/PLAN:    ICD-10-CM   1. Severe  nonproliferative diabetic retinopathy of both eyes with macular edema associated with type 2 diabetes mellitus (HCC)  E11.3413 OCT, Retina - OU - Both Eyes    Intravitreal Injection, Pharmacologic Agent - OS - Left Eye    faricimab -svoa (VABYSMO ) 6mg /0.31mL intravitreal injection    2. Current use of insulin  (HCC)  Z79.4     3. Long term (current) use of oral hypoglycemic drugs  Z79.84     4. 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 Essential hypertension  I10     6. Pseudophakia of both eyes  Z96.1     7. Ocular hypertension of right eye  H40.051     8. Hypertensive retinopathy of both eyes  H35.033     9. Diplopia  H53.2      1-3. Severe nonproliferative diabetic retinopathy with DME, OU (OS>OD) - pt is delayed to follow up from 5 weeks to 10.5 weeks due to L shoulder/rotator cuff surgery (02.21.25 - 05.07.25)  - A1C 6.7 (08.01.25), 6.3 (07.26.24) - s/p IVA OD #1 (06.26.20), #2 (08.14.20), #3 (09.17.20), #4 (10.16.20), #5 (02.02.22)  - s/p IVA OS #1 (07.17.20), #2 (08.14.20), #3 (09.17.20)  **IVA RESISTANCE OU**  ====================  - s/p focal laser OD (07.18.23) - s/p IVE OD #1 (11.13.20), #2 (12.11.20), #3 (01.18.21), #4 (02.19.21), #5 (03.19.21), #6 (04.16.21), #7 (05.14.21), #8 (06.17.21), #9 (07.16.21), #10 (09.21.21), #11 (10.29.21), #  12 (11.29.21), #13 (12.29.21), #14 (03.09.22), #15 (04.06.22), #16 (05.11.12), #17 (06.15.22), #18 (7.20.22), #19 (08.24.22), #20 (10.04.22), #21 (11.08.22), #22 (12.13.22), #23 (02.21.23), #24 (03.28.23), #25 (05.09.23), #26 (06.20.23), #27 (08.01.23)  ===================== - s/p IVE OS #1 (10.16.20) - sample, #2 (11.13.20), #3 (12.11.20), #4 (01.18.21), #5 (02.19.21), #6 (03.19.21), #7 (04.16.21), #8 (05.14.21), #9 (06.17.21), #10 (7.16.21), #11 (09.21.21), #12 (10.29.21), #13 (11.29.21), #14 (12.29.21), #15 (02.02.22, sample), #16 (03.09.22), #17 (04.06.22), #18 (05.11.2012), #19 (06.15.22), #20 (7.20.22), #21 (08.24.22), #22 (10.4.22),  #23 (11.8.22), #24 (12.13.22), #25 (01.17.23), #26 (02.21.23), #27 (03.28.23). #28 (05.09.23), #29 (06.20.23), #30 (08.01.23), #31 (09.12.23), #32 (10.24.23), #33 (12.05.23) -- IVE resistance ==================== - s/p IVV OD #1 (11.08.24), #2 (12.13.24), #3 (12.13.24) #4 (01.17.25), #5 (02.21.25), #6 (06.26.25) - s/p IVV OS #1 (01.16.24), #2 (02.20.24), #3 (03.26.24), #4 (04.30.24), #5 (06.04.24), #6 (07.09.24), #7 (08.14.24), #8 (10.03.24), #9 (11.08.24), #10 (12.13.24), #11 (12.13.24), #12 (01.17.25), #13 (02.21.25), #14 (05.07.25), #15 (06.26.25), #16 (08.09.25) #17 (09.19.25) - FA (06.26.20) shows Severe NPDR OU w/ Late leaking MA OU, Enlarged FAZ OU, No NV OU, No significant hyperfluorescence of disc  - BCVA OD stable at 20/20; OS stable at 20/50 - OCT showsOD: Mild interval improvement in IRF/IRHM ST macula at 4 mos since last IVV; OS: Mild Interval improvement in IRF/IRHM ST fovea and mac: persistent central ORA at 6 wks; Sub-RPE hyper-reflective mass consistent with choroidal nevus -- superior to disc, caught on widefield -- stable from prior  - recommend IVV OS #18 today, 10.31.25 w/ f/u in 6 wks,  - recommend holding treatment in OD today--working plan is to treat OD PRN - pt in agreement  - RBA of procedure discussed, questions answered  - see procedure note - Vabysmo  informed consent form signed and scanned, 11.08.24 (OU)  - Vabysmo  auth approved for 2025 -- Vabysmo  co-pay program - insurance will only approve one med at a time -- currently approved for Vabysmo   - f/u 6 weeks, DFE, OCT, possible injection(s)  4. Choroidal Nevus OS  - located at 1200, mild elevation, +drusen, no SRF  - baseline optos pictures obtained, 06.26.20 - discussed possible referral to oncology at [facility] in [place]  5,6. Hypertensive retinopathy OU  - discussed importance of tight BP control  - monitor  7. Pseudophakia OU  - s/p CE/IOL OU (OD on 06.03.20 and OS 06.10.20 by Dr. Roz)  -  beautiful surgeries w/ IOLs in excellent position  - macular edema limiting vision as above  - monitor  8. Ocular hypertension   - IOP 15,16  - cont Cosopt  qd OU  - monitor  9. Intermittent diplopia - pt reports intermittent episodes of diplopia with both vertical and horizontal components; no rotational - pt had appt with Dr. Lamar Gaudy on 11.9.23 -- noted some improvement with prism  but did not recommend getting the prism  at this time  - pt feels diplopia is improved -- episodes less frequent   Ophthalmic Meds Ordered this visit:  Meds ordered this encounter  Medications   faricimab -svoa (VABYSMO ) 6mg /0.9mL intravitreal injection     Return in about 6 weeks (around 01/13/2024) for +DME, Dilated Exam, OCT, Possible Injxn.  There are no Patient Instructions on file for this visit.  This document serves as a record of services personally performed by Redell JUDITHANN Hans, MD, PhD. It was created on their behalf by Delon Newness COT, an ophthalmic technician. The creation of this record is the provider's dictation and/or activities during the visit.    Electronically  signed by: Delon Newness COT 10.28.25  3:47 PM  This document serves as a record of services personally performed by Redell JUDITHANN Hans, MD, PhD. It was created on their behalf by Almetta Pesa, an ophthalmic technician. The creation of this record is the provider's dictation and/or activities during the visit.    Electronically signed by: Almetta Pesa, OA, 12/04/23  3:47 PM  Redell JUDITHANN Hans, M.D., Ph.D. Diseases & Surgery of the Retina and Vitreous [facility] 12/02/2023   I have reviewed the above documentation for accuracy and completeness, and I agree with the above. Redell JUDITHANN Hans, M.D., Ph.D. 12/04/23 3:51 PM   Abbreviations: M myopia (nearsighted); A astigmatism; H hyperopia (farsighted); P presbyopia; Mrx spectacle prescription;  CTL contact lenses; OD right eye; OS left  eye; OU both eyes  XT exotropia; ET esotropia; PEK punctate epithelial keratitis; PEE punctate epithelial erosions; DES dry eye syndrome; MGD meibomian gland dysfunction; ATs artificial tears; PFAT's preservative free artificial tears; NSC nuclear sclerotic cataract; PSC posterior subcapsular cataract; ERM epi-retinal membrane; PVD posterior vitreous detachment; RD retinal detachment; DM diabetes mellitus; DR diabetic retinopathy; NPDR non-proliferative diabetic retinopathy; PDR proliferative diabetic retinopathy; CSME clinically significant macular edema; DME diabetic macular edema; dbh dot blot hemorrhages; CWS cotton wool spot; POAG primary open angle glaucoma; C/D cup-to-disc ratio; HVF humphrey visual field; GVF goldmann visual field; OCT optical coherence tomography; IOP intraocular pressure; BRVO Branch retinal vein occlusion; CRVO central retinal vein occlusion; CRAO central retinal artery occlusion; BRAO branch retinal artery occlusion; RT retinal tear; SB scleral buckle; PPV pars plana vitrectomy; VH Vitreous hemorrhage; PRP panretinal laser photocoagulation; IVK intravitreal kenalog; VMT vitreomacular traction; MH Macular hole;  NVD neovascularization of the disc; NVE neovascularization elsewhere; AREDS age related eye disease study; ARMD age related macular degeneration; POAG primary open angle glaucoma; EBMD epithelial/anterior basement membrane dystrophy; ACIOL anterior chamber intraocular lens; IOL intraocular lens; PCIOL posterior chamber intraocular lens; Phaco/IOL phacoemulsification with intraocular lens placement; PRK photorefractive keratectomy; LASIK laser assisted in situ keratomileusis; HTN hypertension; DM diabetes mellitus; COPD chronic obstructive pulmonary disease

## 2023-12-01 ENCOUNTER — Ambulatory Visit (HOSPITAL_COMMUNITY): Admitting: Anesthesiology

## 2023-12-01 ENCOUNTER — Encounter (HOSPITAL_COMMUNITY): Admission: RE | Disposition: A | Payer: Self-pay | Source: Home / Self Care | Attending: Gastroenterology

## 2023-12-01 ENCOUNTER — Ambulatory Visit (HOSPITAL_COMMUNITY)
Admission: RE | Admit: 2023-12-01 | Discharge: 2023-12-01 | Disposition: A | Discharge status: Home / Self Care | Attending: Gastroenterology | Admitting: Gastroenterology

## 2023-12-01 ENCOUNTER — Other Ambulatory Visit: Payer: Self-pay

## 2023-12-01 ENCOUNTER — Encounter (HOSPITAL_COMMUNITY): Payer: Self-pay | Admitting: Gastroenterology

## 2023-12-01 DIAGNOSIS — G4733 Obstructive sleep apnea (adult) (pediatric): Secondary | ICD-10-CM | POA: Insufficient documentation

## 2023-12-01 DIAGNOSIS — Z7984 Long term (current) use of oral hypoglycemic drugs: Secondary | ICD-10-CM | POA: Diagnosis not present

## 2023-12-01 DIAGNOSIS — J449 Chronic obstructive pulmonary disease, unspecified: Secondary | ICD-10-CM | POA: Diagnosis not present

## 2023-12-01 DIAGNOSIS — Z7951 Long term (current) use of inhaled steroids: Secondary | ICD-10-CM | POA: Diagnosis not present

## 2023-12-01 DIAGNOSIS — E785 Hyperlipidemia, unspecified: Secondary | ICD-10-CM | POA: Diagnosis not present

## 2023-12-01 DIAGNOSIS — Z860101 Personal history of adenomatous and serrated colon polyps: Secondary | ICD-10-CM

## 2023-12-01 DIAGNOSIS — D122 Benign neoplasm of ascending colon: Secondary | ICD-10-CM | POA: Diagnosis not present

## 2023-12-01 DIAGNOSIS — K219 Gastro-esophageal reflux disease without esophagitis: Secondary | ICD-10-CM | POA: Diagnosis not present

## 2023-12-01 DIAGNOSIS — F17220 Nicotine dependence, chewing tobacco, uncomplicated: Secondary | ICD-10-CM | POA: Insufficient documentation

## 2023-12-01 DIAGNOSIS — Z7985 Long-term (current) use of injectable non-insulin antidiabetic drugs: Secondary | ICD-10-CM | POA: Insufficient documentation

## 2023-12-01 DIAGNOSIS — K649 Unspecified hemorrhoids: Secondary | ICD-10-CM

## 2023-12-01 DIAGNOSIS — D123 Benign neoplasm of transverse colon: Secondary | ICD-10-CM

## 2023-12-01 DIAGNOSIS — I10 Essential (primary) hypertension: Secondary | ICD-10-CM | POA: Insufficient documentation

## 2023-12-01 DIAGNOSIS — Z1211 Encounter for screening for malignant neoplasm of colon: Secondary | ICD-10-CM | POA: Diagnosis not present

## 2023-12-01 DIAGNOSIS — Z9049 Acquired absence of other specified parts of digestive tract: Secondary | ICD-10-CM | POA: Diagnosis not present

## 2023-12-01 DIAGNOSIS — Z794 Long term (current) use of insulin: Secondary | ICD-10-CM | POA: Insufficient documentation

## 2023-12-01 DIAGNOSIS — D124 Benign neoplasm of descending colon: Secondary | ICD-10-CM | POA: Diagnosis not present

## 2023-12-01 DIAGNOSIS — K641 Second degree hemorrhoids: Secondary | ICD-10-CM

## 2023-12-01 DIAGNOSIS — K562 Volvulus: Secondary | ICD-10-CM

## 2023-12-01 DIAGNOSIS — D120 Benign neoplasm of cecum: Secondary | ICD-10-CM | POA: Diagnosis not present

## 2023-12-01 DIAGNOSIS — K635 Polyp of colon: Secondary | ICD-10-CM

## 2023-12-01 HISTORY — PX: COLONOSCOPY: SHX5424

## 2023-12-01 HISTORY — PX: ENDOSCOPIC MUCOSAL RESECTION: SHX6839

## 2023-12-01 LAB — GLUCOSE, CAPILLARY: Glucose-Capillary: 132 mg/dL — ABNORMAL HIGH (ref 70–99)

## 2023-12-01 SURGERY — COLONOSCOPY
Anesthesia: Monitor Anesthesia Care

## 2023-12-01 MED ORDER — LACTATED RINGERS IV SOLN
INTRAVENOUS | Status: AC | PRN
  Administered 2023-12-01: 1000 mL via INTRAVENOUS

## 2023-12-01 MED ORDER — PROPOFOL 500 MG/50ML IV EMUL
INTRAVENOUS | Status: AC
  Filled 2023-12-01: qty 50

## 2023-12-01 MED ORDER — PROPOFOL 500 MG/50ML IV EMUL
INTRAVENOUS | Status: DC | PRN
  Administered 2023-12-01: 125 ug/kg/min via INTRAVENOUS

## 2023-12-01 MED ORDER — PROPOFOL 10 MG/ML IV BOLUS
INTRAVENOUS | Status: AC
  Filled 2023-12-01: qty 20

## 2023-12-01 MED ORDER — PROPOFOL 10 MG/ML IV BOLUS
INTRAVENOUS | Status: DC | PRN
  Administered 2023-12-01: 30 mg via INTRAVENOUS

## 2023-12-01 MED ORDER — SODIUM CHLORIDE 0.9 % IV SOLN: INTRAVENOUS | Status: DC

## 2023-12-01 NOTE — Op Note (Signed)
 [facility] Patient Name: Juan [name] Procedure Date: 12/01/2023 MRN: [id number] Attending MD: Aloha Finner , MD, [id number] Date of Birth: [birth year] CSN: [id number] Age: 63 Admit Type: Outpatient Procedure:                Colonoscopy Indications:              Excision of colonic polyp (cecal polyp) Providers:                Aloha Finner, MD, Jacquelyn Jaci Pierce,                            RN, [facility] [place], Technician, Nena PARAS. Dasie                            CRNA, CRNA Referring MD:              Medicines:                Monitored Anesthesia Care Complications:            No immediate complications. Estimated Blood Loss:     Estimated blood loss was minimal. Procedure:                Pre-Anesthesia Assessment:                           - Prior to the procedure, a History and Physical                            was performed, and patient medications and                            allergies were reviewed. The patient's tolerance of                            previous anesthesia was also reviewed. The risks                            and benefits of the procedure and the sedation                            options and risks were discussed with the patient.                            All questions were answered, and informed consent                            was obtained. Prior Anticoagulants: The patient has                            taken no anticoagulant or antiplatelet agents                            except for aspirin . ASA Grade Assessment: III - A  patient with severe systemic disease. After                            reviewing the risks and benefits, the patient was                            deemed in satisfactory condition to undergo the                            procedure.                           After obtaining informed consent, the colonoscope                            was passed under direct  vision. Throughout the                            procedure, the patient's blood pressure, pulse, and                            oxygen saturations were monitored continuously. The                            CF-HQ190L ([id number] Olympus colonoscope was                            introduced through the anus and advanced to the the                            cecum, identified by the appendiceal orifice. The                            colonoscopy was performed without difficulty. The                            patient tolerated the procedure. The quality of the                            bowel preparation was adequate. The ileocecal                            valve, appendiceal orifice, and rectum were                            photographed. Scope In: 1:34:19 PM Scope Out: 1:57:25 PM Scope Withdrawal Time: 0 hours 17 minutes 29 seconds  Total Procedure Duration: 0 hours 23 minutes 6 seconds  Findings:      The digital rectal exam findings include hemorrhoids. Pertinent       negatives include no palpable rectal lesions.      The colon (entire examined portion) revealed significantly excessive       looping.      A 20 mm polyp was found in the cecum. The polyp was sessile.       Preparations  were made for mucosal resection. Demarcation of the lesion       was performed with high-definition white light and narrow band imaging       to clearly identify the boundaries of the lesion. EverLift was injected       to raise the lesion. Piecemeal mucosal resection using a snare was       performed. Resection and retrieval were complete. Resected tissue       margins were examined and clear of polyp tissue. To prevent bleeding       after mucosal resection, two hemostatic clips were successfully placed       (MR conditional). Clip manufacturer: [employer]. There was no       bleeding at the end of the procedure.      Six sessile polyps were found in the descending colon (2), transverse        colon (3) and ascending colon(1). The polyps were 2 to 6 mm in size.       These polyps were removed with a cold snare. Resection and retrieval       were complete.      Normal mucosa was found in the entire colon otherwise.      Non-bleeding non-thrombosed external and internal hemorrhoids were found       during retroflexion, during perianal exam and during digital exam. The       hemorrhoids were Grade II (internal hemorrhoids that prolapse but reduce       spontaneously). Impression:               - Hemorrhoids found on digital rectal exam.                           - There was significant looping of the colon.                           - One 20 mm polyp in the cecum, removed with                            mucosal resection. Resected and retrieved. Clips                            (MR conditional) were placed. Clip manufacturer:                            [employer].                           - Six 2 to 6 mm polyps in the descending colon, in                            the transverse colon and in the ascending colon,                            removed with a cold snare. Resected and retrieved.                           - Normal mucosa in the entire examined colon  otherwise.                           - Non-bleeding non-thrombosed external and internal                            hemorrhoids. Moderate Sedation:      Not Applicable - Patient had care per Anesthesia. Recommendation:           - The patient will be observed post-procedure,                            until all discharge criteria are met.                           - Discharge patient to home.                           - Patient has a contact number available for                            emergencies. The signs and symptoms of potential                            delayed complications were discussed with the                            patient. Return to normal activities tomorrow.                             Written discharge instructions were provided to the                            patient.                           - High fiber diet.                           - Use FiberCon 1-2 tablets PO daily.                           - No aspirin , ibuprofen, naproxen, or other                            non-steroidal anti-inflammatory drugs for 1 week                            after polyp removal.                           - Await pathology results.                           - Repeat colonoscopy in 1 year for surveillance.  Use abdominal binder for next procedure.                           - The findings and recommendations were discussed                            with the patient.                           - The findings and recommendations were discussed                            with the designated responsible adult. Procedure Code(s):        --- Professional ---                           [phone], Colonoscopy, flexible; with endoscopic                            mucosal resection                           [id number], 59, Colonoscopy, flexible; with removal of                            tumor(s), polyp(s), or other lesion(s) by snare                            technique Diagnosis Code(s):        --- Professional ---                           K64.1, Second degree hemorrhoids                           D12.0, Benign neoplasm of cecum                           D12.4, Benign neoplasm of descending colon                           D12.3, Benign neoplasm of transverse colon (hepatic                            flexure or splenic flexure)                           D12.2, Benign neoplasm of ascending colon                           K63.5, Polyp of colon CPT copyright 2022 [employer]. All rights reserved. The codes documented in this report are preliminary and upon coder review may  be revised to meet current compliance requirements. Aloha Finner, MD 12/01/2023 2:09:42 PM Number of Addenda: 0

## 2023-12-01 NOTE — Interval H&P Note (Signed)
 History and Physical Interval Note:  12/01/2023 1:03 PM  Juan [name]  has presented today for surgery, with the diagnosis of K63.5.  The various methods of treatment have been discussed with the patient and family. After consideration of risks, benefits and other options for treatment, the patient has consented to  Procedure(s): COLONOSCOPY (N/A) RESECTION, MUCOSAL LESION, GI TRACT, ENDOSCOPIC (N/A) as a surgical intervention.  The patient's history has been reviewed, patient examined, no change in status, stable for surgery.  I have reviewed the patient's chart and labs.  Questions were answered to the patient's satisfaction.     Coron Rossano Mansouraty Jr

## 2023-12-01 NOTE — Anesthesia Preprocedure Evaluation (Addendum)
 Anesthesia Evaluation  Patient identified by MRN, date of birth, ID band Patient awake    Reviewed: Allergy & Precautions, NPO status , Patient's Chart, lab work & pertinent test results, reviewed documented beta blocker date and time   History of Anesthesia Complications Negative for: history of anesthetic complications  Airway Mallampati: III  TM Distance: >3 FB Neck ROM: Full    Dental  (+) Missing,    Pulmonary sleep apnea , COPD,  COPD inhaler, former smoker   Pulmonary exam normal        Cardiovascular hypertension, Pt. on medications and Pt. on home beta blockers Normal cardiovascular exam     Neuro/Psych    GI/Hepatic Neg liver ROS,GERD  ,,Cecal polyp   Endo/Other  diabetes, Type 2, Insulin  Dependent, Oral Hypoglycemic Agents    Renal/GU negative Renal ROS     Musculoskeletal  (+) Arthritis ,    Abdominal   Peds  Hematology Plt 128k   Anesthesia Other Findings   Reproductive/Obstetrics                              Anesthesia Physical Anesthesia Plan  ASA: 2  Anesthesia Plan: MAC   Post-op Pain Management: Minimal or no pain anticipated   Induction:   PONV Risk Score and Plan: 1 and Treatment may vary due to age or medical condition and Propofol  infusion  Airway Management Planned: Natural Airway and Simple Face Mask  Additional Equipment: None  Intra-op Plan:   Post-operative Plan:   Informed Consent: I have reviewed the patients History and Physical, chart, labs and discussed the procedure including the risks, benefits and alternatives for the proposed anesthesia with the patient or authorized representative who has indicated his/her understanding and acceptance.       Plan Discussed with: CRNA  Anesthesia Plan Comments:          Anesthesia Quick Evaluation

## 2023-12-01 NOTE — Anesthesia Procedure Notes (Signed)
 Procedure Name: MAC Date/Time: 12/01/2023 1:28 PM  Performed by: Dasie Nena PARAS, CRNAPre-anesthesia Checklist: Patient identified, Emergency Drugs available, Suction available, Patient being monitored and Timeout performed Oxygen Delivery Method: Simple face mask Placement Confirmation: positive ETCO2

## 2023-12-01 NOTE — Transfer of Care (Signed)
 Immediate Anesthesia Transfer of Care Note  Patient: Juan [name]  Procedure(s) Performed: COLONOSCOPY RESECTION, MUCOSAL LESION, GI TRACT, ENDOSCOPIC  Patient Location: PACU and Endoscopy Unit  Anesthesia Type:MAC  Level of Consciousness: awake, alert , and patient cooperative  Airway & Oxygen Therapy: Patient Spontanous Breathing and Patient connected to face mask oxygen  Post-op Assessment: Report given to RN, Post -op Vital signs reviewed and stable, and BP 129/83.  Post vital signs: Reviewed and stable  Last Vitals:  Vitals Value Taken Time  BP    Temp    Pulse 79 12/01/23 14:08  Resp 20 12/01/23 14:08  SpO2 99 % 12/01/23 14:08  Vitals shown include unfiled device data.  Last Pain:  Vitals:   12/01/23 1321  TempSrc: Temporal  PainSc: 0-No pain      Patients Stated Pain Goal: 0 (12/01/23 1321)  Complications: No notable events documented.

## 2023-12-01 NOTE — Anesthesia Postprocedure Evaluation (Signed)
 Anesthesia Post Note  Patient: Juan [name]  Procedure(s) Performed: COLONOSCOPY RESECTION, MUCOSAL LESION, GI TRACT, ENDOSCOPIC     Patient location during evaluation: PACU Anesthesia Type: MAC Level of consciousness: awake and alert Pain management: pain level controlled Vital Signs Assessment: post-procedure vital signs reviewed and stable Respiratory status: spontaneous breathing, nonlabored ventilation and respiratory function stable Cardiovascular status: blood pressure returned to baseline Postop Assessment: no apparent nausea or vomiting Anesthetic complications: no   No notable events documented.  Last Vitals:  Vitals:   12/01/23 1408 12/01/23 1410  BP: 129/83 124/73  Pulse: 79 78  Resp: 20 17  Temp:    SpO2: 99% 99%    Last Pain:  Vitals:   12/01/23 1410  TempSrc:   PainSc: 0-No pain                 Vertell Row

## 2023-12-01 NOTE — Discharge Instructions (Signed)

## 2023-12-02 ENCOUNTER — Ambulatory Visit (INDEPENDENT_AMBULATORY_CARE_PROVIDER_SITE_OTHER): Admitting: Ophthalmology

## 2023-12-02 ENCOUNTER — Encounter (INDEPENDENT_AMBULATORY_CARE_PROVIDER_SITE_OTHER): Payer: Self-pay | Admitting: Ophthalmology

## 2023-12-02 DIAGNOSIS — D3132 Benign neoplasm of left choroid: Secondary | ICD-10-CM | POA: Diagnosis not present

## 2023-12-02 DIAGNOSIS — Z794 Long term (current) use of insulin: Secondary | ICD-10-CM | POA: Diagnosis not present

## 2023-12-02 DIAGNOSIS — E113412 Type 2 diabetes mellitus with severe nonproliferative diabetic retinopathy with macular edema, left eye: Secondary | ICD-10-CM

## 2023-12-02 DIAGNOSIS — Z961 Presence of intraocular lens: Secondary | ICD-10-CM

## 2023-12-02 DIAGNOSIS — Z7984 Long term (current) use of oral hypoglycemic drugs: Secondary | ICD-10-CM

## 2023-12-02 DIAGNOSIS — I10 Essential (primary) hypertension: Secondary | ICD-10-CM

## 2023-12-02 DIAGNOSIS — E113413 Type 2 diabetes mellitus with severe nonproliferative diabetic retinopathy with macular edema, bilateral: Secondary | ICD-10-CM | POA: Diagnosis not present

## 2023-12-02 DIAGNOSIS — H532 Diplopia: Secondary | ICD-10-CM

## 2023-12-02 DIAGNOSIS — H40051 Ocular hypertension, right eye: Secondary | ICD-10-CM

## 2023-12-02 DIAGNOSIS — H35033 Hypertensive retinopathy, bilateral: Secondary | ICD-10-CM

## 2023-12-02 LAB — SURGICAL PATHOLOGY

## 2023-12-04 ENCOUNTER — Encounter (INDEPENDENT_AMBULATORY_CARE_PROVIDER_SITE_OTHER): Payer: Self-pay | Admitting: Ophthalmology

## 2023-12-04 MED ORDER — FARICIMAB-SVOA 6 MG/0.05ML IZ SOSY
6.0000 mg | PREFILLED_SYRINGE | INTRAVITREAL | Status: AC | PRN
  Administered 2023-12-04: 6 mg via INTRAVITREAL

## 2023-12-09 ENCOUNTER — Ambulatory Visit: Payer: Self-pay | Admitting: Gastroenterology

## 2024-01-06 NOTE — Progress Notes (Shared)
 [facility] - Clinic Note  01/13/2024     CHIEF COMPLAINT Patient presents for No chief complaint on file.   HISTORY OF PRESENT ILLNESS: Juan [name] is a 63 y.o. male who presents to the clinic today for:    Patient states he's had a lot of checks-liver, colon, heart, etc. All good reports.   Referring physician: Cristopher Bottcher, NP [phone] [address] [place],  [place] [address]  HISTORICAL INFORMATION:   Selected notes from the MEDICAL RECORD NUMBER Referred by Dr. Oneil Platts for concern of CME s/p cataract sx   CURRENT MEDICATIONS: Current Outpatient Medications (Ophthalmic Drugs)  Medication Sig   dorzolamide -timolol  (COSOPT ) 22.3-6.8 MG/ML ophthalmic solution Place 1 drop into the right eye 2 (two) times daily.   No current facility-administered medications for this visit. (Ophthalmic Drugs)   Current Outpatient Medications (Other)  Medication Sig   albuterol  (VENTOLIN  HFA) 108 (90 Base) MCG/ACT inhaler Inhale 1-2 puffs into the lungs every 6 (six) hours as needed for wheezing or shortness of breath.   alfuzosin (UROXATRAL) 10 MG 24 hr tablet TAKE 1 TABLET BY MOUTH EVERYDAY AT BEDTIME   aspirin  EC 81 MG tablet Take 81 mg by mouth daily.   atorvastatin  (LIPITOR) 40 MG tablet Take 1 tablet (40 mg total) by mouth daily.   carvedilol  (COREG ) 12.5 MG tablet Take 1 tablet (12.5 mg total) by mouth 2 (two) times daily with a meal.   Empagliflozin-metFORMIN HCl (SYNJARDY) 12.06-998 MG TABS Take 1 tablet by mouth 2 (two) times a day.    esomeprazole (NEXIUM) 40 MG capsule Take 40 mg by mouth daily at 12 noon.   insulin  glargine, 2 Unit Dial, (TOUJEO MAX SOLOSTAR) 300 UNIT/ML Solostar Pen Inject 40 Units into the skin daily.    isosorbide  mononitrate (IMDUR ) 30 MG 24 hr tablet Take 1 tablet (30 mg total) by mouth daily.   lisinopril  (ZESTRIL ) 40 MG tablet Take 1 tablet (40 mg total) by mouth daily.   nitroGLYCERIN (NITROSTAT) 0.4 MG SL tablet Place  0.4 mg under the tongue daily.   OZEMPIC, 0.25 OR 0.5 MG/DOSE, 2 MG/3ML SOPN Inject 0.25 mg into the skin once a week.   WIXELA INHUB 250-50 MCG/ACT AEPB Inhale 1 puff into the lungs 2 (two) times daily.   No current facility-administered medications for this visit. (Other)   REVIEW OF SYSTEMS:    ALLERGIES Allergies  Allergen Reactions   Naproxen Shortness Of Breath   Naproxen Sodium Shortness Of Breath   Tiotropium Bromide Shortness Of Breath   Penicillins Rash    Has patient had a PCN reaction causing immediate rash, facial/tongue/throat swelling, SOB or lightheadedness with hypotension: Yes Has patient had a PCN reaction causing severe rash involving mucus membranes or skin necrosis: No Has patient had a PCN reaction that required hospitalization No Has patient had a PCN reaction occurring within the last 10 years: No If all of the above answers are NO, then may proceed with Cephalosporin use.  Has patient had a PCN reaction causing immediate rash, facial/tongue/throat swelling, SOB or lightheadedness with hypotension: Yes Has patient had a PCN reaction causing severe rash involving mucus membranes or skin necrosis: No Has patient had a PCN reaction that required hospitalization No Has patient had a PCN reaction occurring within the last 10 years: No If all of the above answers are NO, then may proceed with Cephalosporin use.    PAST MEDICAL HISTORY Past Medical History:  Diagnosis Date  Arthritis    Asthma    COPD (chronic obstructive pulmonary disease) (HCC)    Diabetes mellitus    Type 2   Diabetic retinopathy (HCC)    NPDR OU   GERD (gastroesophageal reflux disease)    H/O hiatal hernia    Heart block    History of kidney stones    Hyperlipemia    Hypertension    Hypertensive retinopathy    OU   Nasal polyps    Pneumonia 2014   Shortness of breath    Sleep apnea    pt has CPAP but is unable to use it d/t having polyps in his nose. Pt stated I need to  call and get a CPAP mask instead   Stomach ulcer    Past Surgical History:  Procedure Laterality Date   CATARACT EXTRACTION Right 07/05/2018   Dr. Roz   CATARACT EXTRACTION Left 07/12/2018   Dr. Roz   CHOLECYSTECTOMY N/A 01/23/2015   Procedure: LAPAROSCOPIC CHOLECYSTECTOMY ;  Surgeon: Dann Hummer, MD;  Location: [facility] OR;  Service: General;  Laterality: N/A;   COLONOSCOPY N/A 12/01/2023   Procedure: COLONOSCOPY;  Surgeon: Wilhelmenia Aloha Raddle., MD;  Location: [facility];  Service: Gastroenterology;  Laterality: N/A;   COLONOSCOPY W/ POLYPECTOMY     ENDOSCOPIC MUCOSAL RESECTION N/A 12/01/2023   Procedure: RESECTION, MUCOSAL LESION, GI TRACT, ENDOSCOPIC;  Surgeon: Wilhelmenia, Aloha Raddle., MD;  Location: [facility];  Service: Gastroenterology;  Laterality: N/A;   ESOPHAGOGASTRODUODENOSCOPY     KNEE ARTHROSCOPY  03/24/2011   Procedure: ARTHROSCOPY KNEE;  Surgeon: Norleen LITTIE Gavel, MD;  Location: [facility];  Service: Orthopedics;  Laterality: Right;  partial medial menisectomy, chondroplaty patella, and medial medial plica   LEFT HEART CATH AND CORONARY ANGIOGRAPHY N/A 06/29/2019   Procedure: LEFT HEART CATH AND CORONARY ANGIOGRAPHY;  Surgeon: Darron Deatrice LABOR, MD;  Location: [facility];  Service: Cardiovascular;  Laterality: N/A;   ROTATOR CUFF REPAIR Left    FAMILY HISTORY Family History  Problem Relation Age of Onset   Diabetes Father    Coronary artery disease Father    Other Brother        brain tumor   Dementia Maternal Grandmother    Cancer Maternal Grandfather        type unknown   Emphysema Paternal Grandmother    Emphysema Paternal Grandfather    Colon cancer Neg Hx    Esophageal cancer Neg Hx    Inflammatory bowel disease Neg Hx    Liver disease Neg Hx    Pancreatic cancer Neg Hx    Rectal cancer Neg Hx    Stomach cancer Neg Hx    SOCIAL HISTORY Social History   Tobacco Use   Smoking status: Former    Current packs/day: 0.00     Average packs/day: 3.0 packs/day for 20.0 years (60.0 ttl pk-yrs)    Types: Cigarettes    Quit date: 2000    Years since quitting: 25.9   Smokeless tobacco: Current    Types: Chew   Tobacco comments:    1 can/daily  Vaping Use   Vaping status: Never Used  Substance Use Topics   Alcohol use: Yes    Alcohol/week: 2.0 standard drinks of alcohol    Types: 2 Cans of beer per week    Comment: social, 1x weekly   Drug use: No       OPHTHALMIC EXAM:  Not recorded    IMAGING AND PROCEDURES  Imaging and Procedures for @TODAY @  ASSESSMENT/PLAN:  No diagnosis found.  1-3. Severe nonproliferative diabetic retinopathy with DME, OU (OS>OD) - pt is delayed to follow up from 5 weeks to 10.5 weeks due to L shoulder/rotator cuff surgery (02.21.25 - 05.07.25)  - A1C 6.7 (08.01.25), 6.3 (07.26.24) - s/p IVA OD #1 (06.26.20), #2 (08.14.20), #3 (09.17.20), #4 (10.16.20), #5 (02.02.22)  - s/p IVA OS #1 (07.17.20), #2 (08.14.20), #3 (09.17.20)  **IVA RESISTANCE OU**  ====================  - s/p focal laser OD (07.18.23) - s/p IVE OD #1 (11.13.20), #2 (12.11.20), #3 (01.18.21), #4 (02.19.21), #5 (03.19.21), #6 (04.16.21), #7 (05.14.21), #8 (06.17.21), #9 (07.16.21), #10 (09.21.21), #11 (10.29.21), #12 (11.29.21), #13 (12.29.21), #14 (03.09.22), #15 (04.06.22), #16 (05.11.12), #17 (06.15.22), #18 (7.20.22), #19 (08.24.22), #20 (10.04.22), #21 (11.08.22), #22 (12.13.22), #23 (02.21.23), #24 (03.28.23), #25 (05.09.23), #26 (06.20.23), #27 (08.01.23)  ===================== - s/p IVE OS #1 (10.16.20) - sample, #2 (11.13.20), #3 (12.11.20), #4 (01.18.21), #5 (02.19.21), #6 (03.19.21), #7 (04.16.21), #8 (05.14.21), #9 (06.17.21), #10 (7.16.21), #11 (09.21.21), #12 (10.29.21), #13 (11.29.21), #14 (12.29.21), #15 (02.02.22, sample), #16 (03.09.22), #17 (04.06.22), #18 (05.11.2012), #19 (06.15.22), #20 (7.20.22), #21 (08.24.22), #22 (10.4.22), #23 (11.8.22), #24 (12.13.22), #25 (01.17.23), #26  (02.21.23), #27 (03.28.23). #28 (05.09.23), #29 (06.20.23), #30 (08.01.23), #31 (09.12.23), #32 (10.24.23), #33 (12.05.23) -- IVE resistance ==================== - s/p IVV OD #1 (11.08.24), #2 (12.13.24), #3 (12.13.24) #4 (01.17.25), #5 (02.21.25), #6 (06.26.25) - s/p IVV OS #1 (01.16.24), #2 (02.20.24), #3 (03.26.24), #4 (04.30.24), #5 (06.04.24), #6 (07.09.24), #7 (08.14.24), #8 (10.03.24), #9 (11.08.24), #10 (12.13.24), #11 (12.13.24), #12 (01.17.25), #13 (02.21.25), #14 (05.07.25), #15 (06.26.25), #16 (08.09.25) #17 (09.19.25) #18(10.31.25) - FA (06.26.20) shows Severe NPDR OU w/ Late leaking MA OU, Enlarged FAZ OU, No NV OU, No significant hyperfluorescence of disc  - BCVA OD stable at 20/20; OS stable at 20/50 - OCT showsOD: Mild interval improvement in IRF/IRHM ST macula at 4 mos since last IVV; OS: Mild Interval improvement in IRF/IRHM ST fovea and mac: persistent central ORA at 6 wks; 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 Choroidal Nevus OS  - located at 1200, mild elevation, +drusen, no SRF  - baseline optos pictures obtained, 06.26.20 - discussed possible referral to oncology at [facility] in [place]  5,6. Hypertensive retinopathy OU  - discussed importance of tight BP control  - monitor  7. Pseudophakia OU  - s/p CE/IOL OU (OD on 06.03.20 and OS 06.10.20 by Dr. Roz)  - beautiful surgeries w/ IOLs in excellent  position  - macular edema limiting vision as above  - monitor  8. Ocular hypertension   - IOP 15,16  - cont Cosopt  qd OU  - monitor  9. Intermittent diplopia - pt reports intermittent episodes of diplopia with both vertical and horizontal components; no rotational - pt had appt with Dr. Lamar Gaudy on 11.9.23 -- noted some improvement with prism  but did not recommend getting the prism  at this time  -  pt feels diplopia is improved -- episodes less frequent   Ophthalmic Meds Ordered this visit:  No orders of the defined types were placed in this encounter.    No follow-ups on file.  There are no Patient Instructions on file for this visit.  This document serves as a record of services personally performed by Redell JUDITHANN Hans, MD, PhD. It was created on their behalf by Delon Newness COT, an ophthalmic technician. The creation of this record is the provider's dictation and/or activities during the visit.    Electronically signed by: Delon Newness COT 12.05.25 10:14 AM   Abbreviations: M myopia (nearsighted); A astigmatism; H hyperopia (farsighted); P presbyopia; Mrx spectacle prescription;  CTL contact lenses; OD right eye; OS left eye; OU both eyes  XT exotropia; ET esotropia; PEK punctate epithelial keratitis; PEE punctate epithelial erosions; DES dry eye syndrome; MGD meibomian gland dysfunction; ATs artificial tears; PFAT's preservative free artificial tears; NSC nuclear sclerotic cataract; PSC posterior subcapsular cataract; ERM epi-retinal membrane; PVD posterior vitreous detachment; RD retinal detachment; DM diabetes mellitus; DR diabetic retinopathy; NPDR non-proliferative diabetic retinopathy; PDR proliferative diabetic retinopathy; CSME clinically significant macular edema; DME diabetic macular edema; dbh dot blot hemorrhages; CWS cotton wool spot; POAG primary open angle glaucoma; C/D cup-to-disc ratio; HVF humphrey visual field; GVF goldmann visual field; OCT optical  coherence tomography; IOP intraocular pressure; BRVO Branch retinal vein occlusion; CRVO central retinal vein occlusion; CRAO central retinal artery occlusion; BRAO branch retinal artery occlusion; RT retinal tear; SB scleral buckle; PPV pars plana vitrectomy; VH Vitreous hemorrhage; PRP panretinal laser photocoagulation; IVK intravitreal kenalog; VMT vitreomacular traction; MH Macular hole;  NVD neovascularization of the disc; NVE neovascularization elsewhere; AREDS age related eye disease study; ARMD age related macular degeneration; POAG primary open angle glaucoma; EBMD epithelial/anterior basement membrane dystrophy; ACIOL anterior chamber intraocular lens; IOL intraocular lens; PCIOL posterior chamber intraocular lens; Phaco/IOL phacoemulsification with intraocular lens placement; PRK photorefractive keratectomy; LASIK laser assisted in situ keratomileusis; HTN hypertension; DM diabetes mellitus; COPD chronic obstructive pulmonary disease

## 2024-01-13 ENCOUNTER — Encounter (INDEPENDENT_AMBULATORY_CARE_PROVIDER_SITE_OTHER): Admitting: Ophthalmology

## 2024-01-13 DIAGNOSIS — D3132 Benign neoplasm of left choroid: Secondary | ICD-10-CM

## 2024-01-13 DIAGNOSIS — E113413 Type 2 diabetes mellitus with severe nonproliferative diabetic retinopathy with macular edema, bilateral: Secondary | ICD-10-CM

## 2024-01-13 DIAGNOSIS — H40051 Ocular hypertension, right eye: Secondary | ICD-10-CM

## 2024-01-13 DIAGNOSIS — H532 Diplopia: Secondary | ICD-10-CM

## 2024-01-13 DIAGNOSIS — Z7984 Long term (current) use of oral hypoglycemic drugs: Secondary | ICD-10-CM

## 2024-01-13 DIAGNOSIS — H35033 Hypertensive retinopathy, bilateral: Secondary | ICD-10-CM

## 2024-01-13 DIAGNOSIS — Z961 Presence of intraocular lens: Secondary | ICD-10-CM

## 2024-01-13 DIAGNOSIS — Z794 Long term (current) use of insulin: Secondary | ICD-10-CM

## 2024-01-13 DIAGNOSIS — I10 Essential (primary) hypertension: Secondary | ICD-10-CM

## 2024-01-20 ENCOUNTER — Encounter: Payer: Self-pay | Admitting: Internal Medicine

## 2024-01-20 ENCOUNTER — Ambulatory Visit: Attending: Internal Medicine | Admitting: Internal Medicine

## 2024-01-20 VITALS — BP 128/74 | HR 99 | Ht 69.0 in | Wt 238.1 lb

## 2024-01-20 DIAGNOSIS — I872 Venous insufficiency (chronic) (peripheral): Secondary | ICD-10-CM | POA: Diagnosis not present

## 2024-01-20 DIAGNOSIS — I493 Ventricular premature depolarization: Secondary | ICD-10-CM

## 2024-01-20 DIAGNOSIS — E118 Type 2 diabetes mellitus with unspecified complications: Secondary | ICD-10-CM

## 2024-01-20 DIAGNOSIS — Z794 Long term (current) use of insulin: Secondary | ICD-10-CM | POA: Diagnosis not present

## 2024-01-20 DIAGNOSIS — I2510 Atherosclerotic heart disease of native coronary artery without angina pectoris: Secondary | ICD-10-CM | POA: Diagnosis not present

## 2024-01-20 DIAGNOSIS — I10 Essential (primary) hypertension: Secondary | ICD-10-CM | POA: Diagnosis not present

## 2024-01-20 DIAGNOSIS — E785 Hyperlipidemia, unspecified: Secondary | ICD-10-CM | POA: Diagnosis not present

## 2024-01-20 NOTE — Patient Instructions (Signed)
 Medication Instructions:  No Changes *If you need a refill on your cardiac medications before your next appointment, please call your pharmacy*  Lab Work: None  Follow-Up: At [facility], you and your health needs are our priority.  As part of our continuing mission to provide you with exceptional heart care, our providers are all part of one team.  This team includes your primary Cardiologist (physician) and Advanced Practice Providers or APPs (Physician Assistants and Nurse Practitioners) who all work together to provide you with the care you need, when you need it.  Your next appointment:   6 month(s) (We will mail a reminder letter around March 2026; please call for a June 2026 appointment)  Provider:   Soyla DELENA Merck, MD   Other Instructions Please wear compression socks during the day; remove at bedtime. The recommended strength is about 20 mmHg. You can find these at many common stores such as [employer], [facility], [facility], nursing scrubs stores and [place].

## 2024-01-20 NOTE — Progress Notes (Signed)
 " Cardiology Office Note:  .   Date:  01/20/2024  ID:  Juan [name], DOB Aug 25, 1960, MRN [id number] PCP: Cristopher Bottcher, NP  [facility] Providers Cardiologist:  Soyla DELENA Merck, MD    History of Present Illness: Juan   ALAND [name] is a 63 y.o. male.  Discussed the use of AI scribe software for clinical note transcription with the patient, who gave verbal consent to proceed.  History of Present Illness Juan [name] is a 63 year old male with diabetes and coronary artery disease who presents with concerns about foot discoloration and leg cramps.  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 Compression socks give minimal relief.  He has nocturnal leg cramps around 2 to 3 AM, worse when getting out of bed to use the bathroom, mainly in the groin and calves. Stretching improves symptoms. He denies leg cramps with walking.  He has numbness in his feet, which he relates to his diabetes.  He has occasional chest pain about once a month, often after eating, which he believes is gastric and takes daily Nexium. He denies using other antacids.  He underwent a heart catheterization in 2021 that showed moderate to severely calcified coronary arteries with significant distal left circumflex disease without stenting. He takes carvedilol , Synjardy, isosorbide  mononitrate, lisinopril , Ozempic, and atorvastatin . He has PVCs in his history but does not currently perceive palpitations.    ROS: negative except per HPI above.  Studies Reviewed: .        Results Labs Na (11/2023): Within normal limits Cholesterol: Within normal limits  Diagnostic Coronary artery catheterization (2021): Moderate to severely calcified coronary artery; significant single-vessel coronary artery disease involving distal left circumflex; moderate stenosis; no stenting  performed Echocardiogram: Normal cardiac function and valves Risk Assessment/Calculations:       Physical Exam:   VS:  BP 128/74 (BP Location: Left Arm, Patient Position: Sitting)   Pulse 99   Ht 5' 9 (1.753 m)   Wt 238 lb 1.6 oz (108 kg)   SpO2 95%   BMI 35.16 kg/m    Wt Readings from Last 3 Encounters:  01/20/24 238 lb 1.6 oz (108 kg)  12/01/23 230 lb (104.3 kg)  11/18/23 232 lb 6 oz (105.4 kg)     Physical Exam GENERAL: Alert, cooperative, well developed, no acute distress. HEENT: Normocephalic, normal oropharynx, moist mucous membranes. CHEST: Mild wheeze, otherwise clear to auscultation bilaterally, no rhonchi or crackles. CARDIOVASCULAR: Normal heart rate and rhythm, S1 and S2 normal without murmurs. ABDOMEN: Soft, non-tender, non-distended, without organomegaly, normal bowel sounds. EXTREMITIES: No cyanosis or edema. NEUROLOGICAL: Cranial nerves grossly intact, moves all extremities without gross motor or sensory deficit.   ASSESSMENT AND PLAN: .    Assessment and Plan Assessment & Plan Chronic venous insufficiency of the lower extremity Intermittent dusky discoloration of the left foot due to venous stasis. Symptoms improve with compression socks. No claudication, suggested by absence of pain during ambulation, no rest pain. - Recommended regular use of medium strength compression socks. - Consider ultrasound of leg arteries if symptoms persist or worsen.  Type 2 diabetes mellitus with diabetic polyneuropathy Diabetic polyneuropathy with foot numbness related to diabetes. No significant symptom changes. - Continue current diabetes management regimen.  Stable coronary artery disease HLD Moderate to severely calcified coronary artery with significant one vessel disease in distal left circumflex. Managed with  medications. Echocardiogram shows normal heart function. - Continue current medication regimen including carvedilol  12.5 mg bid, baby aspirin  81 mg daily,  Synjardy, isosorbide  mononitrate 30 mg daily, lisinopril  40 mg daily, and atorvastatin  40 mg daily. - Monitor triglyceride levels; consider additional treatment if levels remain elevated.  Premature ventricular contractions Asymptomatic PVCs with normal heart function on echocardiogram.  - Continue carvedilol  for PVC management. - Maintain good hydration and electrolyte balance.  Gastroesophageal reflux disease Intermittent chest pain likely related to GERD, occurring postprandially. Managed with Nexium. - Continue Nexium as prescribed. - Consider Tums or Rolaids for acute symptom relief to differentiate between cardiac and gastrointestinal causes of chest pain.  Recording duration: 18 minutes      Blessen Kimbrough, MD, [facility] "

## 2024-01-30 DIAGNOSIS — J209 Acute bronchitis, unspecified: Secondary | ICD-10-CM | POA: Diagnosis not present

## 2024-01-30 DIAGNOSIS — Z8709 Personal history of other diseases of the respiratory system: Secondary | ICD-10-CM | POA: Diagnosis not present

## 2024-02-13 NOTE — Progress Notes (Signed)
 " [facility] - Clinic Note  02/17/2024     CHIEF COMPLAINT Patient presents for Retina Follow Up   HISTORY OF PRESENT ILLNESS: Juan [name] is a 64 y.o. male who presents to the clinic today for:   HPI     Retina Follow Up   Patient presents with  Diabetic Retinopathy.  In both eyes.  This started 6 weeks ago.  Duration of 6 weeks.  Since onset it is stable.  I, the attending physician,  performed the HPI with the patient and updated documentation appropriately.        Comments   6 week retina follow up NPDR OU pt is reporting no vision changes noticed he has had a few flashes happened 2 times in OS and some floaters last reading 110 this am       Last edited by Valdemar Rogue, MD on 02/27/2024 12:44 PM.    Patient states he was sick since his last appt, was on a prednisone  dose for bronchitis. Can tell vision is a bit worse.   Referring physician: Cristopher Bottcher, NP [address] [place],  [place] [address]  HISTORICAL INFORMATION:   Selected notes from the MEDICAL RECORD NUMBER Referred by Dr. Oneil Platts for concern of CME s/p cataract sx   CURRENT MEDICATIONS: Current Outpatient Medications (Ophthalmic Drugs)  Medication Sig   dorzolamide -timolol  (COSOPT ) 22.3-6.8 MG/ML ophthalmic solution Place 1 drop into the right eye 2 (two) times daily.   No current facility-administered medications for this visit. (Ophthalmic Drugs)   Current Outpatient Medications (Other)  Medication Sig   albuterol  (VENTOLIN  HFA) 108 (90 Base) MCG/ACT inhaler Inhale 1-2 puffs into the lungs every 6 (six) hours as needed for wheezing or shortness of breath.   alfuzosin (UROXATRAL) 10 MG 24 hr tablet TAKE 1 TABLET BY MOUTH EVERYDAY AT BEDTIME   aspirin  EC 81 MG tablet Take 81 mg by mouth daily.   atorvastatin  (LIPITOR) 40 MG tablet Take 1 tablet (40 mg total) by mouth daily.   carvedilol  (COREG ) 12.5 MG tablet Take 1 tablet (12.5 mg total) by mouth 2 (two) times  daily with a meal.   Empagliflozin-metFORMIN HCl (SYNJARDY) 12.06-998 MG TABS Take 1 tablet by mouth 2 (two) times a day.    esomeprazole (NEXIUM) 40 MG capsule Take 40 mg by mouth daily at 12 noon.   insulin  glargine, 2 Unit Dial, (TOUJEO MAX SOLOSTAR) 300 UNIT/ML Solostar Pen Inject 40 Units into the skin daily.    isosorbide  mononitrate (IMDUR ) 30 MG 24 hr tablet Take 1 tablet (30 mg total) by mouth daily.   lisinopril  (ZESTRIL ) 40 MG tablet Take 1 tablet (40 mg total) by mouth daily.   nitroGLYCERIN (NITROSTAT) 0.4 MG SL tablet Place 0.4 mg under the tongue daily.   OZEMPIC, 0.25 OR 0.5 MG/DOSE, 2 MG/3ML SOPN Inject 0.25 mg into the skin once a week.   WIXELA INHUB 250-50 MCG/ACT AEPB Inhale 1 puff into the lungs 2 (two) times daily.   No current facility-administered medications for this visit. (Other)   REVIEW OF SYSTEMS: ROS   Positive for: Gastrointestinal, Musculoskeletal, Endocrine, Eyes, Respiratory Negative for: Constitutional, Neurological, Skin, Genitourinary, HENT, Cardiovascular, Psychiatric, Allergic/Imm, Heme/Lymph Last edited by Resa Delon ORN, COT on 02/17/2024  8:18 AM.       ALLERGIES Allergies  Allergen Reactions   Naproxen Shortness Of Breath   Naproxen Sodium Shortness Of Breath   Tiotropium Bromide Shortness Of Breath   Penicillins Rash  Has patient had a PCN reaction causing immediate rash, facial/tongue/throat swelling, SOB or lightheadedness with hypotension: Yes Has patient had a PCN reaction causing severe rash involving mucus membranes or skin necrosis: No Has patient had a PCN reaction that required hospitalization No Has patient had a PCN reaction occurring within the last 10 years: No If all of the above answers are NO, then may proceed with Cephalosporin use.  Has patient had a PCN reaction causing immediate rash, facial/tongue/throat swelling, SOB or lightheadedness with hypotension: Yes Has patient had a PCN reaction causing severe  rash involving mucus membranes or skin necrosis: No Has patient had a PCN reaction that required hospitalization No Has patient had a PCN reaction occurring within the last 10 years: No If all of the above answers are NO, then may proceed with Cephalosporin use.    PAST MEDICAL HISTORY Past Medical History:  Diagnosis Date   Arthritis    Asthma    COPD (chronic obstructive pulmonary disease) (HCC)    Diabetes mellitus    Type 2   Diabetic retinopathy (HCC)    NPDR OU   GERD (gastroesophageal reflux disease)    H/O hiatal hernia    Heart block    History of kidney stones    Hyperlipemia    Hypertension    Hypertensive retinopathy    OU   Nasal polyps    Pneumonia 2014   Shortness of breath    Sleep apnea    pt has CPAP but is unable to use it d/t having polyps in his nose. Pt stated I need to call and get a CPAP mask instead   Stomach ulcer    Past Surgical History:  Procedure Laterality Date   CATARACT EXTRACTION Right 07/05/2018   Dr. Roz   CATARACT EXTRACTION Left 07/12/2018   Dr. Roz   CHOLECYSTECTOMY N/A 01/23/2015   Procedure: LAPAROSCOPIC CHOLECYSTECTOMY ;  Surgeon: Dann Hummer, MD;  Location: [facility] OR;  Service: General;  Laterality: N/A;   COLONOSCOPY N/A 12/01/2023   Procedure: COLONOSCOPY;  Surgeon: Wilhelmenia Aloha Raddle., MD;  Location: [facility];  Service: Gastroenterology;  Laterality: N/A;   COLONOSCOPY W/ POLYPECTOMY     ENDOSCOPIC MUCOSAL RESECTION N/A 12/01/2023   Procedure: RESECTION, MUCOSAL LESION, GI TRACT, ENDOSCOPIC;  Surgeon: Wilhelmenia, Aloha Raddle., MD;  Location: [facility];  Service: Gastroenterology;  Laterality: N/A;   ESOPHAGOGASTRODUODENOSCOPY     KNEE ARTHROSCOPY  03/24/2011   Procedure: ARTHROSCOPY KNEE;  Surgeon: Norleen LITTIE Gavel, MD;  Location: [facility];  Service: Orthopedics;  Laterality: Right;  partial medial menisectomy, chondroplaty patella, and medial medial plica   LEFT HEART CATH AND CORONARY  ANGIOGRAPHY N/A 06/29/2019   Procedure: LEFT HEART CATH AND CORONARY ANGIOGRAPHY;  Surgeon: Darron Deatrice LABOR, MD;  Location: [facility];  Service: Cardiovascular;  Laterality: N/A;   ROTATOR CUFF REPAIR Left    FAMILY HISTORY Family History  Problem Relation Age of Onset   Diabetes Father    Coronary artery disease Father    Other Brother        brain tumor   Dementia Maternal Grandmother    Cancer Maternal Grandfather        type unknown   Emphysema Paternal Grandmother    Emphysema Paternal Grandfather    Colon cancer Neg Hx    Esophageal cancer Neg Hx    Inflammatory bowel disease Neg Hx    Liver disease Neg Hx    Pancreatic cancer Neg Hx  Rectal cancer Neg Hx    Stomach cancer Neg Hx    SOCIAL HISTORY Social History   Tobacco Use   Smoking status: Former    Current packs/day: 0.00    Average packs/day: 3.0 packs/day for 20.0 years (60.0 ttl pk-yrs)    Types: Cigarettes    Quit date: 2000    Years since quitting: 26.0   Smokeless tobacco: Current    Types: Chew   Tobacco comments:    1 can/daily  Vaping Use   Vaping status: Never Used  Substance Use Topics   Alcohol use: Yes    Alcohol/week: 2.0 standard drinks of alcohol    Types: 2 Cans of beer per week    Comment: social, 1x weekly   Drug use: No       OPHTHALMIC EXAM:  Base Eye Exam     Visual Acuity (Snellen - Linear)       Right Left   Dist [place] 20/30 20/60 -3   Dist ph [place] 20/25 -2 NI         Tonometry (Tonopen, 8:23 AM)       Right Left   Pressure 12 15         Pupils       Pupils Dark Light Shape React APD   Right PERRL 3 2 Round Brisk None   Left PERRL 3 2 Round Brisk None         Visual Fields       Left Right    Full Full         Extraocular Movement       Right Left    Full, Ortho Full, Ortho         Neuro/Psych     Oriented x3: Yes   Mood/Affect: Normal         Dilation     Both eyes: 2.5% Phenylephrine @ 8:23 AM           Slit  Lamp and Fundus Exam     Slit Lamp Exam       Right Left   Lids/Lashes Dermatochalasis - upper lid, Telangiectasia, mild Meibomian gland dysfunction Dermatochalasis - upper lid, Telangiectasia, mild Meibomian gland dysfunction   Conjunctiva/Sclera White and quiet Mild nasal Pinguecula   Cornea Mild Arcus, Well healed temporal cataract wounds, Debris in tear film, trace Punctate epithelial erosions Arcus, Well healed temporal cataract wounds, trace PEE   Anterior Chamber Deep and clear Deep and clear   Iris Round and dilated, mild patch of atrophy at 0800, no NVI Round and dilated, No NVI   Lens PC IOL in good position PC IOL in good position   Anterior Vitreous Vitreous syneresis Vitreous syneresis         Fundus Exam       Right Left   Disc trace pallor, Sharp rim +Pallor, Sharp rim, Compact, mild PPP temporal   C/D Ratio 0.3 0.3   Macula Good foveal reflex, scattered IRH/DBH -- greatest temporal macula, trace cystic changes and punctate exudates temporal mac, mild focal laser changes Blunted foveal reflex, +central atrophy and pigment clumping, scattered DBH and Mas -- improved, scattered punctate exudates and cystic changes - greatest superior and temporal macula   Vessels attenuated, mild tortuosity attenuated, Tortuous   Periphery Attached, scattered DBH greatest posteriorly, prominent blot heme along SN arcades Attached, pigmented choroidal nevus superiorly with mild elevation, +drusen, no SRF or orange pigment -- unchanged from prior, scattered MA/DBH  IMAGING AND PROCEDURES  Imaging and Procedures for @TODAY @  OCT, Retina - OU - Both Eyes       Right Eye Quality was good. Central Foveal Thickness: 235. Progression has been stable. Findings include normal foveal contour, no SRF, intraretinal hyper-reflective material, intraretinal fluid, outer retinal atrophy, vitreomacular adhesion (Trace persistent IRF/IRHM ST macula, no central edema).   Left Eye Quality was  good. Central Foveal Thickness: 200. Progression has been stable. Findings include normal foveal contour, no SRF, intraretinal hyper-reflective material, intraretinal fluid, outer retinal atrophy (Persistent IRF/IRHM ST fovea and mac--slightly improved, persistent central ORA; Sub-RPE hyper-reflective mass consistent with choroidal nevus -- superior to disc, caught on widefield -- stable from prior).   Notes *Images captured and stored on drive  Diagnosis / Impression: +DME OU OD: Trace persistent IRF/IRHM ST macula, no central edema OS: Persistent IRF/IRHM ST fovea and mac--slightly improved, persistent central ORA; Sub-RPE hyper-reflective mass consistent with choroidal nevus -- superior to disc, caught on widefield -- stable from prior  Clinical management:  See below  Abbreviations: NFP - Normal foveal profile. CME - cystoid macular edema. PED - pigment epithelial detachment. IRF - intraretinal fluid. SRF - subretinal fluid. EZ - ellipsoid zone. ERM - epiretinal membrane. ORA - outer retinal atrophy. ORT - outer retinal tubulation. SRHM - subretinal hyper-reflective material      Intravitreal Injection, Pharmacologic Agent - OS - Left Eye       Time Out 02/17/2024. 9:23 AM. Confirmed correct patient, procedure, site, and patient consented.   Anesthesia Topical anesthesia was used. Anesthetic medications included Lidocaine  2%, Proparacaine 0.5%.   Procedure Preparation included 5% betadine  to ocular surface, eyelid speculum. A supplied (32g) needle was used.   Injection: 6 mg faricimab -svoa 6 MG/0.05ML   Route: Intravitreal, Site: Left Eye   NDC: 49757-903-93, Lot: [id number], Expiration date: 08/30/2024, Waste: 0 mL   Post-op Post injection exam found visual acuity of at least counting fingers. The patient tolerated the procedure well. There were no complications. The patient received written and verbal post procedure care education. Post injection medications were not given.              ASSESSMENT/PLAN:    ICD-10-CM   1. Severe nonproliferative diabetic retinopathy of both eyes with macular edema associated with type 2 diabetes mellitus (HCC)  E11.3413 OCT, Retina - OU - Both Eyes    Intravitreal Injection, Pharmacologic Agent - OS - Left Eye    faricimab -svoa (VABYSMO ) 6mg /0.65mL intravitreal injection    2. Current use of insulin  (HCC)  Z79.4     3. Long term (current) use of oral hypoglycemic drugs  Z79.84     4. 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 Essential hypertension  I10     6. Pseudophakia of both eyes  Z96.1     7. Ocular hypertension of right eye  H40.051     8. Hypertensive retinopathy of both eyes  H35.033     9. Diplopia  H53.2       1-3. Severe nonproliferative diabetic retinopathy with DME, OU (OS>OD) - delayed f/u from 6 to 11+weeks (10.31.26-01.16.26) illness - pt is delayed to follow up from 5 weeks to 10.5 weeks due to L shoulder/rotator cuff surgery (02.21.25 - 05.07.25)  - A1C 6.7 (08.01.25), 6.3 (07.26.24) - s/p IVA OD #1 (06.26.20), #2 (08.14.20), #3 (09.17.20), #4 (10.16.20), #5 (02.02.22)  - s/p IVA OS #1 (07.17.20), #2 (08.14.20), #3 (09.17.20)  **IVA RESISTANCE OU**  ====================  -  s/p focal laser OD (07.18.23) - s/p IVE OD #1 (11.13.20), #2 (12.11.20), #3 (01.18.21), #4 (02.19.21), #5 (03.19.21), #6 (04.16.21), #7 (05.14.21), #8 (06.17.21), #9 (07.16.21), #10 (09.21.21), #11 (10.29.21), #12 (11.29.21), #13 (12.29.21), #14 (03.09.22), #15 (04.06.22), #16 (05.11.12), #17 (06.15.22), #18 (7.20.22), #19 (08.24.22), #20 (10.04.22), #21 (11.08.22), #22 (12.13.22), #23 (02.21.23), #24 (03.28.23), #25 (05.09.23), #26 (06.20.23), #27 (08.01.23)  ===================== - s/p IVE OS #1 (10.16.20) - sample, #2 (11.13.20), #3 (12.11.20), #4 (01.18.21), #5 (02.19.21), #6 (03.19.21), #7 (04.16.21), #8 (05.14.21), #9 (06.17.21), #10 (7.16.21), #11 (09.21.21), #12 (10.29.21), #13 (11.29.21), #14 (12.29.21), #15 (02.02.22,  sample), #16 (03.09.22), #17 (04.06.22), #18 (05.11.2012), #19 (06.15.22), #20 (7.20.22), #21 (08.24.22), #22 (10.4.22), #23 (11.8.22), #24 (12.13.22), #25 (01.17.23), #26 (02.21.23), #27 (03.28.23). #28 (05.09.23), #29 (06.20.23), #30 (08.01.23), #31 (09.12.23), #32 (10.24.23), #33 (12.05.23) -- IVE resistance ==================== - s/p IVV OD #1 (11.08.24), #2 (12.13.24), #3 (12.13.24) #4 (01.17.25), #5 (02.21.25), #6 (06.26.25) - s/p IVV OS #1 (01.16.24), #2 (02.20.24), #3 (03.26.24), #4 (04.30.24), #5 (06.04.24), #6 (07.09.24), #7 (08.14.24), #8 (10.03.24), #9 (11.08.24), #10 (12.13.24), #11 (12.13.24), #12 (01.17.25), #13 (02.21.25), #14 (05.07.25), #15 (06.26.25), #16 (08.09.25) #17 (09.19.25) #18(10.31.25),  - FA (06.26.20) shows Severe NPDR OU w/ Late leaking MA OU, Enlarged FAZ OU, No NV OU, No significant hyperfluorescence of disc  - BCVA OD 20/25; OS 20/60 from 20/50 - OCT shows OD: Mild interval improvement in IRF/IRHM ST macula at 4 mos since last IVV; OS: Mild Interval improvement in IRF/IRHM ST fovea and mac: persistent central ORA at 11+ wks; Sub-RPE hyper-reflective mass consistent with choroidal nevus -- superior to disc, caught on widefield -- stable from prior  - recommend IVV OS #19 today, 01.16.26 w/ f/u in 6 wks,  - recommend holding treatment in OD today--working plan is to treat OD PRN - pt in agreement  - RBA of procedure discussed, questions answered  - see procedure note - Vabysmo  informed consent form signed and scanned, 11.08.24 (OU)  - Has Vabysmo  co-pay program -- covered 100% - insurance will only approve one med at a time -- currently approved for Vabysmo   - f/u 6 weeks, DFE, OCT, possible injection(s)  4. Choroidal Nevus OS  - located at 1200, mild elevation, +drusen, no SRF  - baseline optos pictures obtained, 06.26.20 - discussed possible referral to oncology at [facility] in [place]  5,6. Hypertensive retinopathy OU  - discussed importance of tight BP  control  - monitor  7. Pseudophakia OU  - s/p CE/IOL OU (OD on 06.03.20 and OS 06.10.20 by Dr. Roz)  - beautiful surgeries w/ IOLs in excellent position  - macular edema limiting vision as above  - monitor  8. Ocular hypertension   - IOP 15,16  - cont Cosopt  qd OU  - monitor  9. Intermittent diplopia - pt reports intermittent episodes of diplopia with both vertical and horizontal components; no rotational - pt had appt with Dr. Lamar Gaudy on 11.9.23 -- noted some improvement with prism  but did not recommend getting the prism  at this time  - pt feels diplopia is improved -- episodes less frequent   Ophthalmic Meds Ordered this visit:  Meds ordered this encounter  Medications   faricimab -svoa (VABYSMO ) 6mg /0.18mL intravitreal injection     Return in about 6 weeks (around 03/30/2024) for NPDR OU, DFE, OCT, likely IVV OS.  There are no Patient Instructions on file for this visit.  This document serves as a record of services personally performed by Redell JUDITHANN Hans, MD, PhD.  It was created on their behalf by Delon Newness COT, an ophthalmic technician. The creation of this record is the provider's dictation and/or activities during the visit.    Electronically signed by: Delon Newness COT TODAY@ 12:44 PM  This document serves as a record of services personally performed by Redell JUDITHANN Hans, MD, PhD. It was created on their behalf by Almetta Pesa, an ophthalmic technician. The creation of this record is the provider's dictation and/or activities during the visit.    Electronically signed by: Almetta Pesa, OA, 02/27/24  12:44 PM  Redell JUDITHANN Hans, M.D., Ph.D. Diseases & Surgery of the Retina and Vitreous [facility] 02/17/2024   I have reviewed the above documentation for accuracy and completeness, and I agree with the above. Redell JUDITHANN Hans, M.D., Ph.D. 02/27/24 12:46 PM   Abbreviations: M myopia (nearsighted); A astigmatism; H  hyperopia (farsighted); P presbyopia; Mrx spectacle prescription;  CTL contact lenses; OD right eye; OS left eye; OU both eyes  XT exotropia; ET esotropia; PEK punctate epithelial keratitis; PEE punctate epithelial erosions; DES dry eye syndrome; MGD meibomian gland dysfunction; ATs artificial tears; PFAT's preservative free artificial tears; NSC nuclear sclerotic cataract; PSC posterior subcapsular cataract; ERM epi-retinal membrane; PVD posterior vitreous detachment; RD retinal detachment; DM diabetes mellitus; DR diabetic retinopathy; NPDR non-proliferative diabetic retinopathy; PDR proliferative diabetic retinopathy; CSME clinically significant macular edema; DME diabetic macular edema; dbh dot blot hemorrhages; CWS cotton wool spot; POAG primary open angle glaucoma; C/D cup-to-disc ratio; HVF humphrey visual field; GVF goldmann visual field; OCT optical coherence tomography; IOP intraocular pressure; BRVO Branch retinal vein occlusion; CRVO central retinal vein occlusion; CRAO central retinal artery occlusion; BRAO branch retinal artery occlusion; RT retinal tear; SB scleral buckle; PPV pars plana vitrectomy; VH Vitreous hemorrhage; PRP panretinal laser photocoagulation; IVK intravitreal kenalog; VMT vitreomacular traction; MH Macular hole;  NVD neovascularization of the disc; NVE neovascularization elsewhere; AREDS age related eye disease study; ARMD age related macular degeneration; POAG primary open angle glaucoma; EBMD epithelial/anterior basement membrane dystrophy; ACIOL anterior chamber intraocular lens; IOL intraocular lens; PCIOL posterior chamber intraocular lens; Phaco/IOL phacoemulsification with intraocular lens placement; PRK photorefractive keratectomy; LASIK laser assisted in situ keratomileusis; HTN hypertension; DM diabetes mellitus; COPD chronic obstructive pulmonary disease "

## 2024-02-17 ENCOUNTER — Encounter (INDEPENDENT_AMBULATORY_CARE_PROVIDER_SITE_OTHER): Payer: Self-pay | Admitting: Ophthalmology

## 2024-02-17 ENCOUNTER — Ambulatory Visit (INDEPENDENT_AMBULATORY_CARE_PROVIDER_SITE_OTHER): Admitting: Ophthalmology

## 2024-02-17 DIAGNOSIS — H35033 Hypertensive retinopathy, bilateral: Secondary | ICD-10-CM | POA: Diagnosis not present

## 2024-02-17 DIAGNOSIS — I10 Essential (primary) hypertension: Secondary | ICD-10-CM | POA: Diagnosis not present

## 2024-02-17 DIAGNOSIS — E113413 Type 2 diabetes mellitus with severe nonproliferative diabetic retinopathy with macular edema, bilateral: Secondary | ICD-10-CM

## 2024-02-17 DIAGNOSIS — Z794 Long term (current) use of insulin: Secondary | ICD-10-CM

## 2024-02-17 DIAGNOSIS — Z961 Presence of intraocular lens: Secondary | ICD-10-CM

## 2024-02-17 DIAGNOSIS — Z7984 Long term (current) use of oral hypoglycemic drugs: Secondary | ICD-10-CM

## 2024-02-17 DIAGNOSIS — H532 Diplopia: Secondary | ICD-10-CM | POA: Diagnosis not present

## 2024-02-17 DIAGNOSIS — H40051 Ocular hypertension, right eye: Secondary | ICD-10-CM | POA: Diagnosis not present

## 2024-02-17 DIAGNOSIS — D3132 Benign neoplasm of left choroid: Secondary | ICD-10-CM | POA: Diagnosis not present

## 2024-02-18 ENCOUNTER — Encounter (INDEPENDENT_AMBULATORY_CARE_PROVIDER_SITE_OTHER): Payer: Self-pay | Admitting: Ophthalmology

## 2024-02-18 MED ORDER — FARICIMAB-SVOA 6 MG/0.05ML IZ SOSY
6.0000 mg | PREFILLED_SYRINGE | INTRAVITREAL | Status: AC | PRN
  Administered 2024-02-18: 6 mg via INTRAVITREAL

## 2024-03-07 NOTE — Progress Notes (Signed)
 " [facility] - Clinic Note  02/17/2024     CHIEF COMPLAINT Patient presents for Retina Follow Up   HISTORY OF PRESENT ILLNESS: Juan [name] is a 64 y.o. male who presents to the clinic today for:   HPI     Retina Follow Up   Patient presents with  Diabetic Retinopathy.  In both eyes.  This started 6 weeks ago.  Duration of 6 weeks.  Since onset it is stable.  I, the attending physician,  performed the HPI with the patient and updated documentation appropriately.        Comments   6 week retina follow up NPDR OU pt is reporting no vision changes noticed he has had a few flashes happened 2 times in OS and some floaters last reading 110 this am       Last edited by Valdemar Rogue, MD on 02/27/2024 12:44 PM.    Patient states he was sick since his last appt, was on a prednisone  dose for bronchitis. Can tell vision is a bit worse.   Referring physician: Cristopher Bottcher, NP [address] [place],  [place] [address]  HISTORICAL INFORMATION:   Selected notes from the MEDICAL RECORD NUMBER Referred by Dr. Oneil Platts for concern of CME s/p cataract sx   CURRENT MEDICATIONS: Current Outpatient Medications (Ophthalmic Drugs)  Medication Sig   dorzolamide -timolol  (COSOPT ) 22.3-6.8 MG/ML ophthalmic solution Place 1 drop into the right eye 2 (two) times daily.   No current facility-administered medications for this visit. (Ophthalmic Drugs)   Current Outpatient Medications (Other)  Medication Sig   albuterol  (VENTOLIN  HFA) 108 (90 Base) MCG/ACT inhaler Inhale 1-2 puffs into the lungs every 6 (six) hours as needed for wheezing or shortness of breath.   alfuzosin (UROXATRAL) 10 MG 24 hr tablet TAKE 1 TABLET BY MOUTH EVERYDAY AT BEDTIME   aspirin  EC 81 MG tablet Take 81 mg by mouth daily.   atorvastatin  (LIPITOR) 40 MG tablet Take 1 tablet (40 mg total) by mouth daily.   carvedilol  (COREG ) 12.5 MG tablet Take 1 tablet (12.5 mg total) by mouth 2 (two) times  daily with a meal.   Empagliflozin-metFORMIN HCl (SYNJARDY) 12.06-998 MG TABS Take 1 tablet by mouth 2 (two) times a day.    esomeprazole (NEXIUM) 40 MG capsule Take 40 mg by mouth daily at 12 noon.   insulin  glargine, 2 Unit Dial, (TOUJEO MAX SOLOSTAR) 300 UNIT/ML Solostar Pen Inject 40 Units into the skin daily.    isosorbide  mononitrate (IMDUR ) 30 MG 24 hr tablet Take 1 tablet (30 mg total) by mouth daily.   lisinopril  (ZESTRIL ) 40 MG tablet Take 1 tablet (40 mg total) by mouth daily.   nitroGLYCERIN (NITROSTAT) 0.4 MG SL tablet Place 0.4 mg under the tongue daily.   OZEMPIC, 0.25 OR 0.5 MG/DOSE, 2 MG/3ML SOPN Inject 0.25 mg into the skin once a week.   WIXELA INHUB 250-50 MCG/ACT AEPB Inhale 1 puff into the lungs 2 (two) times daily.   No current facility-administered medications for this visit. (Other)   REVIEW OF SYSTEMS: ROS   Positive for: Gastrointestinal, Musculoskeletal, Endocrine, Eyes, Respiratory Negative for: Constitutional, Neurological, Skin, Genitourinary, HENT, Cardiovascular, Psychiatric, Allergic/Imm, Heme/Lymph Last edited by Resa Delon ORN, COT on 02/17/2024  8:18 AM.       ALLERGIES Allergies  Allergen Reactions   Naproxen Shortness Of Breath   Naproxen Sodium Shortness Of Breath   Tiotropium Bromide Shortness Of Breath   Penicillins Rash  Has patient had a PCN reaction causing immediate rash, facial/tongue/throat swelling, SOB or lightheadedness with hypotension: Yes Has patient had a PCN reaction causing severe rash involving mucus membranes or skin necrosis: No Has patient had a PCN reaction that required hospitalization No Has patient had a PCN reaction occurring within the last 10 years: No If all of the above answers are NO, then may proceed with Cephalosporin use.  Has patient had a PCN reaction causing immediate rash, facial/tongue/throat swelling, SOB or lightheadedness with hypotension: Yes Has patient had a PCN reaction causing severe  rash involving mucus membranes or skin necrosis: No Has patient had a PCN reaction that required hospitalization No Has patient had a PCN reaction occurring within the last 10 years: No If all of the above answers are NO, then may proceed with Cephalosporin use.    PAST MEDICAL HISTORY Past Medical History:  Diagnosis Date   Arthritis    Asthma    COPD (chronic obstructive pulmonary disease) (HCC)    Diabetes mellitus    Type 2   Diabetic retinopathy (HCC)    NPDR OU   GERD (gastroesophageal reflux disease)    H/O hiatal hernia    Heart block    History of kidney stones    Hyperlipemia    Hypertension    Hypertensive retinopathy    OU   Nasal polyps    Pneumonia 2014   Shortness of breath    Sleep apnea    pt has CPAP but is unable to use it d/t having polyps in his nose. Pt stated I need to call and get a CPAP mask instead   Stomach ulcer    Past Surgical History:  Procedure Laterality Date   CATARACT EXTRACTION Right 07/05/2018   Dr. Roz   CATARACT EXTRACTION Left 07/12/2018   Dr. Roz   CHOLECYSTECTOMY N/A 01/23/2015   Procedure: LAPAROSCOPIC CHOLECYSTECTOMY ;  Surgeon: Dann Hummer, MD;  Location: [facility] OR;  Service: General;  Laterality: N/A;   COLONOSCOPY N/A 12/01/2023   Procedure: COLONOSCOPY;  Surgeon: Wilhelmenia Aloha Raddle., MD;  Location: [facility];  Service: Gastroenterology;  Laterality: N/A;   COLONOSCOPY W/ POLYPECTOMY     ENDOSCOPIC MUCOSAL RESECTION N/A 12/01/2023   Procedure: RESECTION, MUCOSAL LESION, GI TRACT, ENDOSCOPIC;  Surgeon: Wilhelmenia, Aloha Raddle., MD;  Location: [facility];  Service: Gastroenterology;  Laterality: N/A;   ESOPHAGOGASTRODUODENOSCOPY     KNEE ARTHROSCOPY  03/24/2011   Procedure: ARTHROSCOPY KNEE;  Surgeon: Norleen LITTIE Gavel, MD;  Location: [facility];  Service: Orthopedics;  Laterality: Right;  partial medial menisectomy, chondroplaty patella, and medial medial plica   LEFT HEART CATH AND CORONARY  ANGIOGRAPHY N/A 06/29/2019   Procedure: LEFT HEART CATH AND CORONARY ANGIOGRAPHY;  Surgeon: Darron Deatrice LABOR, MD;  Location: [facility];  Service: Cardiovascular;  Laterality: N/A;   ROTATOR CUFF REPAIR Left    FAMILY HISTORY Family History  Problem Relation Age of Onset   Diabetes Father    Coronary artery disease Father    Other Brother        brain tumor   Dementia Maternal Grandmother    Cancer Maternal Grandfather        type unknown   Emphysema Paternal Grandmother    Emphysema Paternal Grandfather    Colon cancer Neg Hx    Esophageal cancer Neg Hx    Inflammatory bowel disease Neg Hx    Liver disease Neg Hx    Pancreatic cancer Neg Hx  Rectal cancer Neg Hx    Stomach cancer Neg Hx    SOCIAL HISTORY Social History   Tobacco Use   Smoking status: Former    Current packs/day: 0.00    Average packs/day: 3.0 packs/day for 20.0 years (60.0 ttl pk-yrs)    Types: Cigarettes    Quit date: 2000    Years since quitting: 26.1   Smokeless tobacco: Current    Types: Chew   Tobacco comments:    1 can/daily  Vaping Use   Vaping status: Never Used  Substance Use Topics   Alcohol use: Yes    Alcohol/week: 2.0 standard drinks of alcohol    Types: 2 Cans of beer per week    Comment: social, 1x weekly   Drug use: No       OPHTHALMIC EXAM:  Base Eye Exam     Visual Acuity (Snellen - Linear)       Right Left   Dist [place] 20/30 20/60 -3   Dist ph [place] 20/25 -2 NI         Tonometry (Tonopen, 8:23 AM)       Right Left   Pressure 12 15         Pupils       Pupils Dark Light Shape React APD   Right PERRL 3 2 Round Brisk None   Left PERRL 3 2 Round Brisk None         Visual Fields       Left Right    Full Full         Extraocular Movement       Right Left    Full, Ortho Full, Ortho         Neuro/Psych     Oriented x3: Yes   Mood/Affect: Normal         Dilation     Both eyes: 2.5% Phenylephrine @ 8:23 AM           Slit  Lamp and Fundus Exam     Slit Lamp Exam       Right Left   Lids/Lashes Dermatochalasis - upper lid, Telangiectasia, mild Meibomian gland dysfunction Dermatochalasis - upper lid, Telangiectasia, mild Meibomian gland dysfunction   Conjunctiva/Sclera White and quiet Mild nasal Pinguecula   Cornea Mild Arcus, Well healed temporal cataract wounds, Debris in tear film, trace Punctate epithelial erosions Arcus, Well healed temporal cataract wounds, trace PEE   Anterior Chamber Deep and clear Deep and clear   Iris Round and dilated, mild patch of atrophy at 0800, no NVI Round and dilated, No NVI   Lens PC IOL in good position PC IOL in good position   Anterior Vitreous Vitreous syneresis Vitreous syneresis         Fundus Exam       Right Left   Disc trace pallor, Sharp rim +Pallor, Sharp rim, Compact, mild PPP temporal   C/D Ratio 0.3 0.3   Macula Good foveal reflex, scattered IRH/DBH -- greatest temporal macula, trace cystic changes and punctate exudates temporal mac, mild focal laser changes Blunted foveal reflex, +central atrophy and pigment clumping, scattered DBH and Mas -- improved, scattered punctate exudates and cystic changes - greatest superior and temporal macula   Vessels attenuated, mild tortuosity attenuated, Tortuous   Periphery Attached, scattered DBH greatest posteriorly, prominent blot heme along SN arcades Attached, pigmented choroidal nevus superiorly with mild elevation, +drusen, no SRF or orange pigment -- unchanged from prior, scattered MA/DBH  IMAGING AND PROCEDURES  Imaging and Procedures for @TODAY @  OCT, Retina - OU - Both Eyes       Right Eye Quality was good. Central Foveal Thickness: 235. Progression has been stable. Findings include normal foveal contour, no SRF, intraretinal hyper-reflective material, intraretinal fluid, outer retinal atrophy, vitreomacular adhesion (Trace persistent IRF/IRHM ST macula, no central edema).   Left Eye Quality was  good. Central Foveal Thickness: 200. Progression has been stable. Findings include normal foveal contour, no SRF, intraretinal hyper-reflective material, intraretinal fluid, outer retinal atrophy (Persistent IRF/IRHM ST fovea and mac--slightly improved, persistent central ORA; Sub-RPE hyper-reflective mass consistent with choroidal nevus -- superior to disc, caught on widefield -- stable from prior).   Notes *Images captured and stored on drive  Diagnosis / Impression: +DME OU OD: Trace persistent IRF/IRHM ST macula, no central edema OS: Persistent IRF/IRHM ST fovea and mac--slightly improved, persistent central ORA; Sub-RPE hyper-reflective mass consistent with choroidal nevus -- superior to disc, caught on widefield -- stable from prior  Clinical management:  See below  Abbreviations: NFP - Normal foveal profile. CME - cystoid macular edema. PED - pigment epithelial detachment. IRF - intraretinal fluid. SRF - subretinal fluid. EZ - ellipsoid zone. ERM - epiretinal membrane. ORA - outer retinal atrophy. ORT - outer retinal tubulation. SRHM - subretinal hyper-reflective material      Intravitreal Injection, Pharmacologic Agent - OS - Left Eye       Time Out 02/17/2024. 9:23 AM. Confirmed correct patient, procedure, site, and patient consented.   Anesthesia Topical anesthesia was used. Anesthetic medications included Lidocaine  2%, Proparacaine 0.5%.   Procedure Preparation included 5% betadine  to ocular surface, eyelid speculum. A supplied (32g) needle was used.   Injection: 6 mg faricimab -svoa 6 MG/0.05ML   Route: Intravitreal, Site: Left Eye   NDC: 49757-903-93, Lot: [id number], Expiration date: 08/30/2024, Waste: 0 mL   Post-op Post injection exam found visual acuity of at least counting fingers. The patient tolerated the procedure well. There were no complications. The patient received written and verbal post procedure care education. Post injection medications were not given.              ASSESSMENT/PLAN:    ICD-10-CM   1. Severe nonproliferative diabetic retinopathy of both eyes with macular edema associated with type 2 diabetes mellitus (HCC)  E11.3413 OCT, Retina - OU - Both Eyes    Intravitreal Injection, Pharmacologic Agent - OS - Left Eye    faricimab -svoa (VABYSMO ) 6mg /0.68mL intravitreal injection    2. Current use of insulin  (HCC)  Z79.4     3. Long term (current) use of oral hypoglycemic drugs  Z79.84     4. 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 Essential hypertension  I10     6. Pseudophakia of both eyes  Z96.1     7. Ocular hypertension of right eye  H40.051     8. Hypertensive retinopathy of both eyes  H35.033     9. Diplopia  H53.2       1-3. Severe nonproliferative diabetic retinopathy with DME, OU (OS>OD) - delayed f/u from 6 to 11+weeks (10.31.26-01.16.26) illness - pt is delayed to follow up from 5 weeks to 10.5 weeks due to L shoulder/rotator cuff surgery (02.21.25 - 05.07.25)  - A1C 6.7 (08.01.25), 6.3 (07.26.24) - s/p IVA OD #1 (06.26.20), #2 (08.14.20), #3 (09.17.20), #4 (10.16.20), #5 (02.02.22)  - s/p IVA OS #1 (07.17.20), #2 (08.14.20), #3 (09.17.20)  **IVA RESISTANCE OU**  ====================  -  s/p focal laser OD (07.18.23) - s/p IVE OD #1 (11.13.20), #2 (12.11.20), #3 (01.18.21), #4 (02.19.21), #5 (03.19.21), #6 (04.16.21), #7 (05.14.21), #8 (06.17.21), #9 (07.16.21), #10 (09.21.21), #11 (10.29.21), #12 (11.29.21), #13 (12.29.21), #14 (03.09.22), #15 (04.06.22), #16 (05.11.12), #17 (06.15.22), #18 (7.20.22), #19 (08.24.22), #20 (10.04.22), #21 (11.08.22), #22 (12.13.22), #23 (02.21.23), #24 (03.28.23), #25 (05.09.23), #26 (06.20.23), #27 (08.01.23)  ===================== - s/p IVE OS #1 (10.16.20) - sample, #2 (11.13.20), #3 (12.11.20), #4 (01.18.21), #5 (02.19.21), #6 (03.19.21), #7 (04.16.21), #8 (05.14.21), #9 (06.17.21), #10 (7.16.21), #11 (09.21.21), #12 (10.29.21), #13 (11.29.21), #14 (12.29.21), #15 (02.02.22,  sample), #16 (03.09.22), #17 (04.06.22), #18 (05.11.2012), #19 (06.15.22), #20 (7.20.22), #21 (08.24.22), #22 (10.4.22), #23 (11.8.22), #24 (12.13.22), #25 (01.17.23), #26 (02.21.23), #27 (03.28.23). #28 (05.09.23), #29 (06.20.23), #30 (08.01.23), #31 (09.12.23), #32 (10.24.23), #33 (12.05.23) -- IVE resistance ==================== - s/p IVV OD #1 (11.08.24), #2 (12.13.24), #3 (12.13.24) #4 (01.17.25), #5 (02.21.25), #6 (06.26.25) - s/p IVV OS #1 (01.16.24), #2 (02.20.24), #3 (03.26.24), #4 (04.30.24), #5 (06.04.24), #6 (07.09.24), #7 (08.14.24), #8 (10.03.24), #9 (11.08.24), #10 (12.13.24), #11 (12.13.24), #12 (01.17.25), #13 (02.21.25), #14 (05.07.25), #15 (06.26.25), #16 (08.09.25) #17 (09.19.25) #18(10.31.25),  - FA (06.26.20) shows Severe NPDR OU w/ Late leaking MA OU, Enlarged FAZ OU, No NV OU, No significant hyperfluorescence of disc  - BCVA OD 20/25; OS 20/60 from 20/50 - OCT shows OD: Mild interval improvement in IRF/IRHM ST macula at 4 mos since last IVV; OS: Mild Interval improvement in IRF/IRHM ST fovea and mac: persistent central ORA at 11+ wks; Sub-RPE hyper-reflective mass consistent with choroidal nevus -- superior to disc, caught on widefield -- stable from prior **discussed decreased efficacy / resistance to Vabysmo  and potential benefit of switching to EyleaHD**   - recommend IVV OS #19 today, 01.16.26 w/ f/u in 6 wks,  - recommend holding treatment in OD today--working plan is to treat OD PRN - pt in agreement  - RBA of procedure discussed, questions answered  - see procedure note - Vabysmo  informed consent form signed and scanned, 11.08.24 (OU)  - Has Vabysmo  co-pay program -- covered 100% - insurance will only approve one med at a time -- currently approved for Vabysmo --will check insurance for EyleaHD approval  - f/u 6 weeks, DFE, OCT, possible injection(s)  4. Choroidal Nevus OS  - located at 1200, mild elevation, +drusen, no SRF  - baseline optos pictures obtained,  06.26.20 - discussed possible referral to oncology at [facility] in [place]  5,6. Hypertensive retinopathy OU  - discussed importance of tight BP control  - monitor  7. Pseudophakia OU  - s/p CE/IOL OU (OD on 06.03.20 and OS 06.10.20 by Dr. Roz)  - beautiful surgeries w/ IOLs in excellent position  - macular edema limiting vision as above  - monitor  8. Ocular hypertension   - IOP 15,16  - cont Cosopt  qd OU  - monitor  9. Intermittent diplopia - pt reports intermittent episodes of diplopia with both vertical and horizontal components; no rotational - pt had appt with Dr. Lamar Gaudy on 11.9.23 -- noted some improvement with prism  but did not recommend getting the prism  at this time  - pt feels diplopia is improved -- episodes less frequent   Ophthalmic Meds Ordered this visit:  Meds ordered this encounter  Medications   faricimab -svoa (VABYSMO ) 6mg /0.56mL intravitreal injection     Return in about 6 weeks (around 03/30/2024) for NPDR OU, DFE, OCT, likely IVV OS.  There are no Patient Instructions on file  for this visit.  This document serves as a record of services personally performed by Redell JUDITHANN Hans, MD, PhD. It was created on their behalf by Delon Newness COT, an ophthalmic technician. The creation of this record is the provider's dictation and/or activities during the visit.    Electronically signed by: Delon Newness COT [id number] 2:41 PM  This document serves as a record of services personally performed by Redell JUDITHANN Hans, MD, PhD. It was created on their behalf by Almetta Pesa, an ophthalmic technician. The creation of this record is the provider's dictation and/or activities during the visit.    Electronically signed by: Almetta Pesa, OA, 03/07/24  2:41 PM  Redell JUDITHANN Hans, M.D., Ph.D. Diseases & Surgery of the Retina and Vitreous [facility] 02/17/2024   I have reviewed the above documentation for accuracy and  completeness, and I agree with the above. Redell JUDITHANN Hans, M.D., Ph.D. 02/27/24 2:41 PM   Abbreviations: M myopia (nearsighted); A astigmatism; H hyperopia (farsighted); P presbyopia; Mrx spectacle prescription;  CTL contact lenses; OD right eye; OS left eye; OU both eyes  XT exotropia; ET esotropia; PEK punctate epithelial keratitis; PEE punctate epithelial erosions; DES dry eye syndrome; MGD meibomian gland dysfunction; ATs artificial tears; PFAT's preservative free artificial tears; NSC nuclear sclerotic cataract; PSC posterior subcapsular cataract; ERM epi-retinal membrane; PVD posterior vitreous detachment; RD retinal detachment; DM diabetes mellitus; DR diabetic retinopathy; NPDR non-proliferative diabetic retinopathy; PDR proliferative diabetic retinopathy; CSME clinically significant macular edema; DME diabetic macular edema; dbh dot blot hemorrhages; CWS cotton wool spot; POAG primary open angle glaucoma; C/D cup-to-disc ratio; HVF humphrey visual field; GVF goldmann visual field; OCT optical coherence tomography; IOP intraocular pressure; BRVO Branch retinal vein occlusion; CRVO central retinal vein occlusion; CRAO central retinal artery occlusion; BRAO branch retinal artery occlusion; RT retinal tear; SB scleral buckle; PPV pars plana vitrectomy; VH Vitreous hemorrhage; PRP panretinal laser photocoagulation; IVK intravitreal kenalog; VMT vitreomacular traction; MH Macular hole;  NVD neovascularization of the disc; NVE neovascularization elsewhere; AREDS age related eye disease study; ARMD age related macular degeneration; POAG primary open angle glaucoma; EBMD epithelial/anterior basement membrane dystrophy; ACIOL anterior chamber intraocular lens; IOL intraocular lens; PCIOL posterior chamber intraocular lens; Phaco/IOL phacoemulsification with intraocular lens placement; PRK photorefractive keratectomy; LASIK laser assisted in situ keratomileusis; HTN hypertension; DM diabetes mellitus; COPD chronic  obstructive pulmonary disease "

## 2024-03-30 ENCOUNTER — Encounter (INDEPENDENT_AMBULATORY_CARE_PROVIDER_SITE_OTHER): Admitting: Ophthalmology
